# Patient Record
Sex: Female | Born: 1945
Health system: Southern US, Community
[De-identification: ages and names within clinical notes are randomized; demographics above are authoritative.]

## PROBLEM LIST (undated history)

## (undated) DIAGNOSIS — D8685 Sarcoid myocarditis: Secondary | ICD-10-CM

## (undated) DIAGNOSIS — I5042 Chronic combined systolic (congestive) and diastolic (congestive) heart failure: Secondary | ICD-10-CM

## (undated) DIAGNOSIS — I499 Cardiac arrhythmia, unspecified: Secondary | ICD-10-CM

## (undated) DIAGNOSIS — I493 Ventricular premature depolarization: Secondary | ICD-10-CM

## (undated) DIAGNOSIS — I484 Atypical atrial flutter: Secondary | ICD-10-CM

## (undated) DIAGNOSIS — I428 Other cardiomyopathies: Secondary | ICD-10-CM

## (undated) DIAGNOSIS — F419 Anxiety disorder, unspecified: Secondary | ICD-10-CM

## (undated) DIAGNOSIS — Z95810 Presence of automatic (implantable) cardiac defibrillator: Secondary | ICD-10-CM

## (undated) DIAGNOSIS — I480 Paroxysmal atrial fibrillation: Secondary | ICD-10-CM

## (undated) DIAGNOSIS — I10 Essential (primary) hypertension: Secondary | ICD-10-CM

## (undated) DIAGNOSIS — M797 Fibromyalgia: Secondary | ICD-10-CM

## (undated) DIAGNOSIS — I491 Atrial premature depolarization: Secondary | ICD-10-CM

## (undated) DIAGNOSIS — I472 Ventricular tachycardia, unspecified: Secondary | ICD-10-CM

## (undated) DIAGNOSIS — I495 Sick sinus syndrome: Secondary | ICD-10-CM

## (undated) HISTORY — PX: THYROIDECTOMY: SHX17

## (undated) HISTORY — DX: Paroxysmal atrial fibrillation: I48.0

## (undated) HISTORY — DX: Atrial premature depolarization: I49.1

## (undated) HISTORY — DX: Atypical atrial flutter: I48.4

## (undated) HISTORY — PX: ABDOMINAL HYSTERECTOMY: SHX81

## (undated) HISTORY — PX: LUMBAR LAMINECTOMY: SHX95

## (undated) HISTORY — DX: Ventricular premature depolarization: I49.3

---

## 1998-09-30 ENCOUNTER — Other Ambulatory Visit: Admission: RE | Admit: 1998-09-30 | Discharge: 1998-09-30 | Payer: Self-pay | Admitting: *Deleted

## 1999-12-30 ENCOUNTER — Other Ambulatory Visit: Admission: RE | Admit: 1999-12-30 | Discharge: 1999-12-30 | Payer: Self-pay | Admitting: *Deleted

## 2001-08-22 ENCOUNTER — Other Ambulatory Visit: Admission: RE | Admit: 2001-08-22 | Discharge: 2001-08-22 | Payer: Self-pay | Admitting: *Deleted

## 2002-09-25 ENCOUNTER — Other Ambulatory Visit: Admission: RE | Admit: 2002-09-25 | Discharge: 2002-09-25 | Payer: Self-pay | Admitting: *Deleted

## 2002-10-05 ENCOUNTER — Encounter: Admission: RE | Admit: 2002-10-05 | Discharge: 2002-10-05 | Payer: Self-pay

## 2006-12-19 ENCOUNTER — Other Ambulatory Visit: Admission: RE | Admit: 2006-12-19 | Discharge: 2006-12-19 | Payer: Self-pay | Admitting: *Deleted

## 2006-12-23 DIAGNOSIS — J309 Allergic rhinitis, unspecified: Secondary | ICD-10-CM | POA: Insufficient documentation

## 2007-08-07 ENCOUNTER — Emergency Department (HOSPITAL_COMMUNITY): Admission: EM | Admit: 2007-08-07 | Discharge: 2007-08-07 | Payer: Self-pay | Admitting: Emergency Medicine

## 2007-08-18 ENCOUNTER — Encounter: Admission: RE | Admit: 2007-08-18 | Discharge: 2007-08-18 | Payer: Self-pay | Admitting: Otolaryngology

## 2008-11-01 DIAGNOSIS — K219 Gastro-esophageal reflux disease without esophagitis: Secondary | ICD-10-CM | POA: Insufficient documentation

## 2010-04-21 DIAGNOSIS — IMO0001 Reserved for inherently not codable concepts without codable children: Secondary | ICD-10-CM | POA: Insufficient documentation

## 2010-10-29 DIAGNOSIS — M8580 Other specified disorders of bone density and structure, unspecified site: Secondary | ICD-10-CM | POA: Insufficient documentation

## 2012-07-14 ENCOUNTER — Encounter (HOSPITAL_COMMUNITY): Payer: Self-pay | Admitting: Emergency Medicine

## 2012-07-14 ENCOUNTER — Emergency Department (HOSPITAL_COMMUNITY): Payer: Medicare Other

## 2012-07-14 ENCOUNTER — Emergency Department (HOSPITAL_COMMUNITY)
Admission: EM | Admit: 2012-07-14 | Discharge: 2012-07-14 | Disposition: A | Discharge status: Home / Self Care | Payer: Medicare Other | Attending: Emergency Medicine | Admitting: Emergency Medicine

## 2012-07-14 DIAGNOSIS — R5383 Other fatigue: Secondary | ICD-10-CM | POA: Insufficient documentation

## 2012-07-14 DIAGNOSIS — I10 Essential (primary) hypertension: Secondary | ICD-10-CM | POA: Insufficient documentation

## 2012-07-14 DIAGNOSIS — R531 Weakness: Secondary | ICD-10-CM

## 2012-07-14 DIAGNOSIS — Z79899 Other long term (current) drug therapy: Secondary | ICD-10-CM | POA: Insufficient documentation

## 2012-07-14 DIAGNOSIS — Z8739 Personal history of other diseases of the musculoskeletal system and connective tissue: Secondary | ICD-10-CM | POA: Insufficient documentation

## 2012-07-14 DIAGNOSIS — F411 Generalized anxiety disorder: Secondary | ICD-10-CM | POA: Insufficient documentation

## 2012-07-14 DIAGNOSIS — R5381 Other malaise: Secondary | ICD-10-CM | POA: Insufficient documentation

## 2012-07-14 HISTORY — DX: Anxiety disorder, unspecified: F41.9

## 2012-07-14 HISTORY — DX: Essential (primary) hypertension: I10

## 2012-07-14 HISTORY — DX: Fibromyalgia: M79.7

## 2012-07-14 HISTORY — DX: Cardiac arrhythmia, unspecified: I49.9

## 2012-07-14 LAB — TROPONIN I: Troponin I: <0.3 ng/mL (ref <0.30)

## 2012-07-14 LAB — CBC WITH DIFFERENTIAL/PLATELET
HCT: 36.6 % (ref 36.0–46.0)
Hemoglobin: 12.9 g/dL (ref 12.0–15.0)
MCH: 30.7 pg (ref 26.0–34.0)
MCHC: 35.2 g/dL (ref 30.0–36.0)
Monocytes Absolute: 0.5 10*3/uL (ref 0.1–1.0)
Monocytes Relative: 7 % (ref 3–12)
Neutro Abs: 2.8 10*3/uL (ref 1.7–7.7)
Neutrophils Relative %: 42 % — ABNORMAL LOW (ref 43–77)
Platelets: 161 10*3/uL (ref 150–400)
RDW: 15.4 % (ref 11.5–15.5)
WBC: 6.7 10*3/uL (ref 4.0–10.5)

## 2012-07-14 LAB — COMPREHENSIVE METABOLIC PANEL
BUN: 20 mg/dL (ref 6–23)
CO2: 28 mEq/L (ref 19–32)
Creatinine, Ser: 1.11 mg/dL — ABNORMAL HIGH (ref 0.50–1.10)
Glucose, Bld: 111 mg/dL — ABNORMAL HIGH (ref 70–99)
Potassium: 3.7 mEq/L (ref 3.5–5.1)
Total Bilirubin: 0.3 mg/dL (ref 0.3–1.2)

## 2012-07-14 MED ORDER — MECLIZINE HCL 25 MG PO TABS: ORAL_TABLET | ORAL | Status: DC

## 2012-07-14 NOTE — ED Notes (Signed)
Patient from home via [facility] c/o dizziness.  Per EMS patient was having runs of mutlifocal runs of PVCs.  No runs of VTach noted.  Hx of hypertension.  Patient alert and oriented x4 at this time and stable will continue to monitor.

## 2012-07-14 NOTE — ED Provider Notes (Signed)
History     CSN: [id number]  Arrival date & time 07/14/12  0041   First MD Initiated Contact with Patient 07/14/12 0143      Chief Complaint  Patient presents with  . Dizziness    (Consider location/radiation/quality/duration/timing/severity/associated sxs/prior treatment) Patient is a 67 y.o. female presenting with weakness. The history is provided by the patient (the pt complains of dizziness). No language interpreter was used.  Weakness This is a new problem. The current episode started 12 to 24 hours ago. The problem occurs constantly. The problem has been gradually improving. Pertinent negatives include no chest pain, no abdominal pain and no headaches. Nothing aggravates the symptoms. Nothing relieves the symptoms.    Past Medical History  Diagnosis Date  . Hypertension   . Anxiety   . Irregular heartbeat   . Fibromyalgia     Past Surgical History  Procedure Laterality Date  . Lumbar laminectomy    . Thyroidectomy    . Abdominal hysterectomy      No family history on file.  History  Substance Use Topics  . Smoking status: Never Smoker   . Smokeless tobacco: Never Used  . Alcohol Use: No    OB History   Grav Para Term Preterm Abortions TAB SAB Ect Mult Living                  Review of Systems  Constitutional: Negative for appetite change and fatigue.  HENT: Negative for congestion, sinus pressure and ear discharge.   Eyes: Negative for discharge.  Respiratory: Negative for cough.   Cardiovascular: Negative for chest pain.  Gastrointestinal: Negative for abdominal pain and diarrhea.  Genitourinary: Negative for frequency and hematuria.  Musculoskeletal: Negative for back pain.  Skin: Negative for rash.  Neurological: Positive for weakness. Negative for seizures and headaches.  Psychiatric/Behavioral: Negative for hallucinations.    Allergies  Codeine  Home Medications   Current Outpatient Rx  Name  Route  Sig  Dispense  Refill  . ALPRAZolam  (XANAX) 0.5 MG tablet   Oral   Take 0.25 mg by mouth daily as needed for anxiety.         Marland Kitchen amitriptyline (ELAVIL) 25 MG tablet   Oral   Take 12.5 mg by mouth at bedtime.         . hydrochlorothiazide (HYDRODIURIL) 25 MG tablet   Oral   Take 25 mg by mouth daily.         Marland Kitchen levothyroxine (SYNTHROID, LEVOTHROID) 200 MCG tablet   Oral   Take 200 mcg by mouth daily before breakfast.         . meclizine (ANTIVERT) 25 MG tablet      Take one every 6 hours for dizziness   15 tablet   0     BP 138/70  Pulse 63  Temp(Src) 97.8 F (36.6 C) (Oral)  Resp 18  SpO2 100%  Physical Exam  Constitutional: She is oriented to person, place, and time. She appears well-developed.  HENT:  Head: Normocephalic.  Eyes: Conjunctivae and EOM are normal. No scleral icterus.  Neck: Neck supple. No thyromegaly present.  Cardiovascular: Normal rate and regular rhythm.  Exam reveals no gallop and no friction rub.   No murmur heard. Pulmonary/Chest: No stridor. She has no wheezes. She has no rales. She exhibits no tenderness.  Abdominal: She exhibits no distension. There is no tenderness. There is no rebound.  Musculoskeletal: Normal range of motion. She exhibits no edema.  Lymphadenopathy:  She has no cervical adenopathy.  Neurological: She is oriented to person, place, and time. Coordination normal.  Skin: No rash noted. No erythema.  Psychiatric: She has a normal mood and affect. Her behavior is normal.    ED Course  Procedures (including critical care time)  Labs Reviewed  CBC WITH DIFFERENTIAL - Abnormal; Notable for the following:    Neutrophils Relative 42 (*)    Lymphocytes Relative 49 (*)    All other components within normal limits  COMPREHENSIVE METABOLIC PANEL - Abnormal; Notable for the following:    Glucose, Bld 111 (*)    Creatinine, Ser 1.11 (*)    GFR calc non Af Amer 50 (*)    GFR calc Af Amer 58 (*)    All other components within normal limits  TROPONIN I    Dg Chest Port 1 View  07/14/2012  *RADIOLOGY REPORT*  Clinical Data: Dizziness.  Weakness.  Irregular heart beat. Fibromyalgia.  PORTABLE CHEST - 1 VIEW  Comparison: None.  Findings: Heart size upper limits of normal for projection.  No airspace disease.  No effusion. Monitoring leads are projected over the chest. Bilateral basilar atelectasis.  IMPRESSION: Borderline heart size.  No acute cardiopulmonary disease.   Original Report Authenticated By: Andreas Newport, M.D.      1. Weakness      Date: 07/14/2012  Rate: 68  Rhythm: normal sinus rhythm  With pvc  QRS Axis: left  Intervals: normal  ST/T Wave abnormalities: normal  Conduction Disutrbances:none  Narrative Interpretation:   Old EKG Reviewed: none available    MDM          Benny Lennert, MD 07/14/12 [phone]

## 2013-08-10 DIAGNOSIS — F411 Generalized anxiety disorder: Secondary | ICD-10-CM | POA: Insufficient documentation

## 2014-03-27 IMAGING — CR DG CHEST 1V PORT
1 series · 1 of 1 positions shown · non-contrast
Comparison: None.

CLINICAL DATA: Dizziness.  Weakness.  Irregular heart beat.
Fibromyalgia.

PORTABLE CHEST - 1 VIEW

[AP]
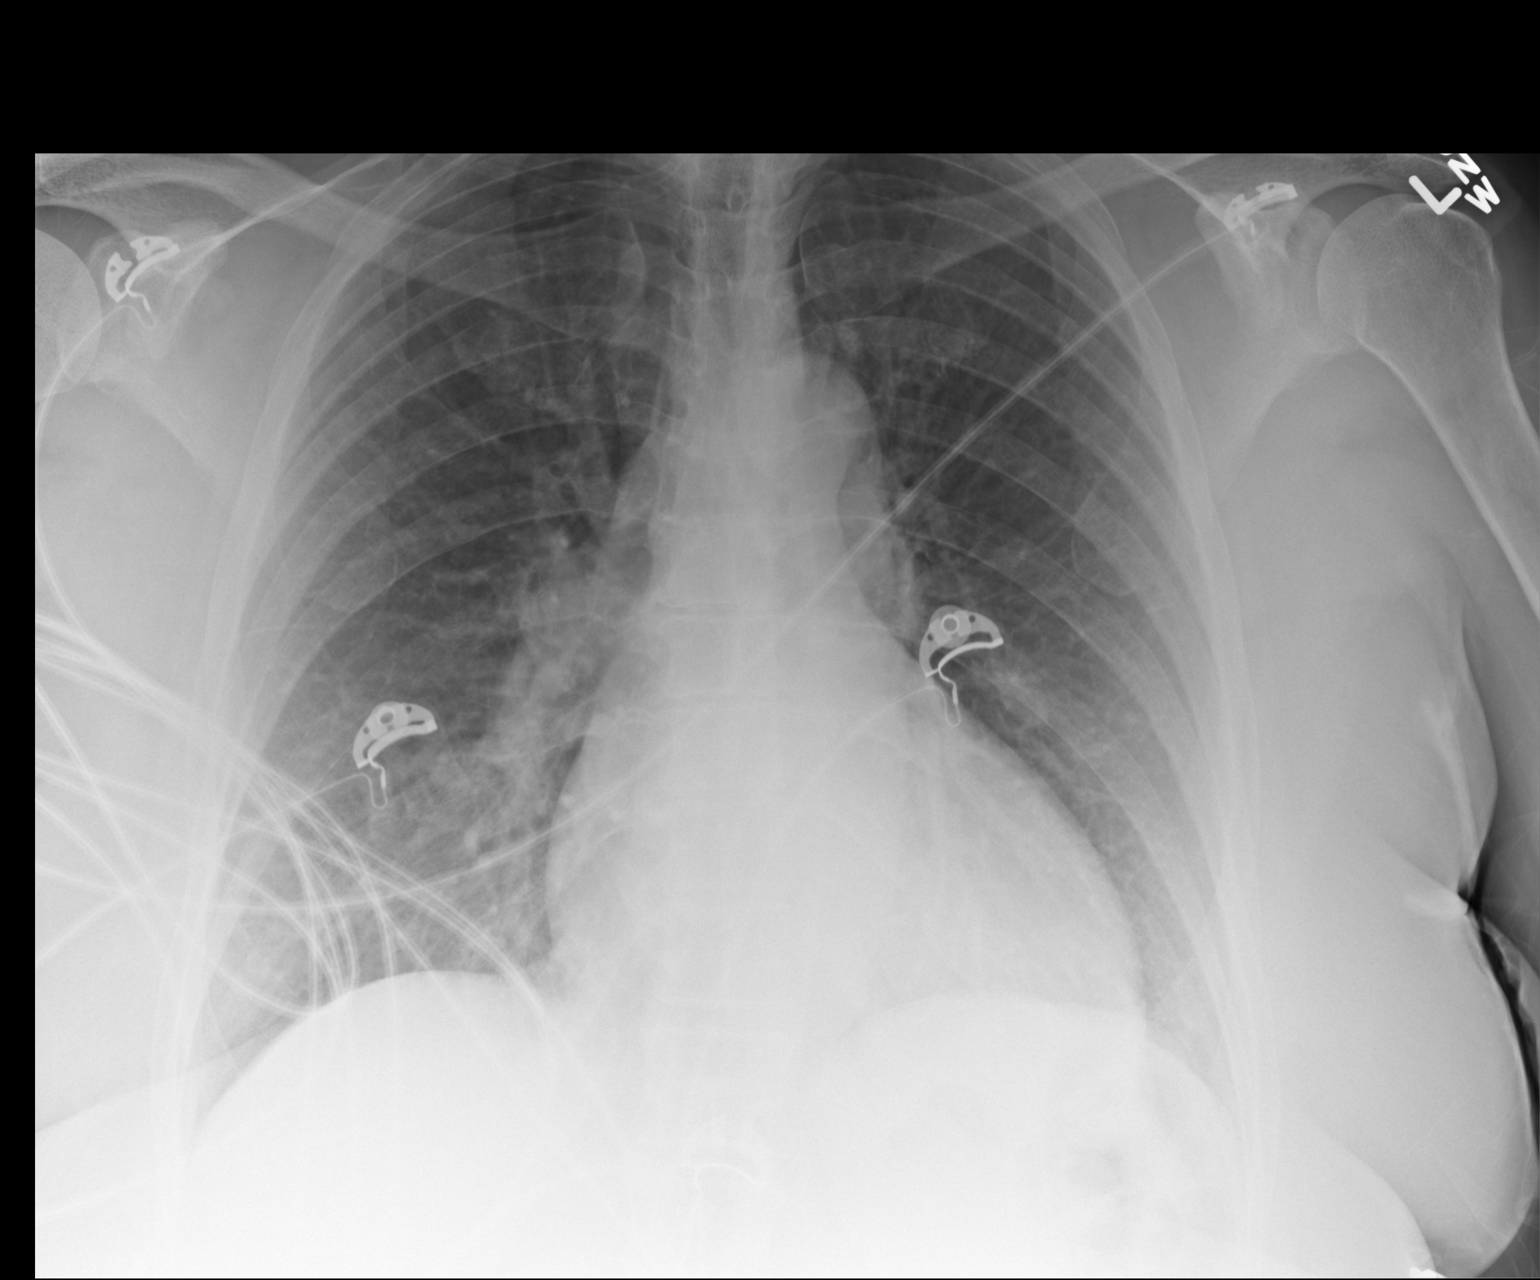

[1 of 1 positions shown; findings below may reference images not displayed]

FINDINGS: Heart size upper limits of normal for projection.  No
airspace disease.  No effusion. Monitoring leads are projected over
the chest. Bilateral basilar atelectasis.
IMPRESSION: Borderline heart size.  No acute cardiopulmonary disease.

## 2016-08-04 DIAGNOSIS — I493 Ventricular premature depolarization: Secondary | ICD-10-CM | POA: Insufficient documentation

## 2018-04-05 DIAGNOSIS — E669 Obesity, unspecified: Secondary | ICD-10-CM | POA: Diagnosis not present

## 2018-04-05 DIAGNOSIS — E039 Hypothyroidism, unspecified: Secondary | ICD-10-CM | POA: Diagnosis not present

## 2018-04-05 DIAGNOSIS — R7303 Prediabetes: Secondary | ICD-10-CM | POA: Diagnosis not present

## 2018-04-05 DIAGNOSIS — I10 Essential (primary) hypertension: Secondary | ICD-10-CM | POA: Diagnosis not present

## 2018-04-07 DIAGNOSIS — I10 Essential (primary) hypertension: Secondary | ICD-10-CM | POA: Diagnosis not present

## 2018-04-07 DIAGNOSIS — E039 Hypothyroidism, unspecified: Secondary | ICD-10-CM | POA: Diagnosis not present

## 2018-04-07 DIAGNOSIS — R7303 Prediabetes: Secondary | ICD-10-CM | POA: Diagnosis not present

## 2018-05-11 DIAGNOSIS — N289 Disorder of kidney and ureter, unspecified: Secondary | ICD-10-CM | POA: Diagnosis not present

## 2018-05-11 DIAGNOSIS — N2889 Other specified disorders of kidney and ureter: Secondary | ICD-10-CM | POA: Diagnosis not present

## 2018-05-11 DIAGNOSIS — I10 Essential (primary) hypertension: Secondary | ICD-10-CM | POA: Diagnosis not present

## 2019-04-05 DIAGNOSIS — I499 Cardiac arrhythmia, unspecified: Secondary | ICD-10-CM | POA: Diagnosis not present

## 2019-04-05 DIAGNOSIS — I10 Essential (primary) hypertension: Secondary | ICD-10-CM | POA: Diagnosis not present

## 2019-04-05 DIAGNOSIS — R600 Localized edema: Secondary | ICD-10-CM | POA: Diagnosis not present

## 2019-04-05 DIAGNOSIS — F411 Generalized anxiety disorder: Secondary | ICD-10-CM | POA: Diagnosis not present

## 2019-04-10 DIAGNOSIS — I10 Essential (primary) hypertension: Secondary | ICD-10-CM | POA: Diagnosis not present

## 2019-04-10 DIAGNOSIS — E7800 Pure hypercholesterolemia, unspecified: Secondary | ICD-10-CM | POA: Diagnosis not present

## 2019-04-10 DIAGNOSIS — E890 Postprocedural hypothyroidism: Secondary | ICD-10-CM | POA: Diagnosis not present

## 2019-04-10 DIAGNOSIS — R7303 Prediabetes: Secondary | ICD-10-CM | POA: Diagnosis not present

## 2019-04-17 DIAGNOSIS — E890 Postprocedural hypothyroidism: Secondary | ICD-10-CM | POA: Diagnosis not present

## 2019-04-17 DIAGNOSIS — F411 Generalized anxiety disorder: Secondary | ICD-10-CM | POA: Diagnosis not present

## 2019-04-17 DIAGNOSIS — Z0000 Encounter for general adult medical examination without abnormal findings: Secondary | ICD-10-CM | POA: Diagnosis not present

## 2019-04-17 DIAGNOSIS — I10 Essential (primary) hypertension: Secondary | ICD-10-CM | POA: Diagnosis not present

## 2019-11-12 ENCOUNTER — Encounter (HOSPITAL_COMMUNITY): Payer: Self-pay

## 2019-11-12 ENCOUNTER — Ambulatory Visit (HOSPITAL_COMMUNITY)
Admission: EM | Admit: 2019-11-12 | Discharge: 2019-11-12 | Disposition: A | Discharge status: Home / Self Care | Payer: Medicare Other | Attending: Family Medicine | Admitting: Family Medicine

## 2019-11-12 ENCOUNTER — Other Ambulatory Visit: Payer: Self-pay

## 2019-11-12 DIAGNOSIS — M791 Myalgia, unspecified site: Secondary | ICD-10-CM

## 2019-11-12 DIAGNOSIS — R200 Anesthesia of skin: Secondary | ICD-10-CM | POA: Diagnosis not present

## 2019-11-12 DIAGNOSIS — I10 Essential (primary) hypertension: Secondary | ICD-10-CM

## 2019-11-12 MED ORDER — GABAPENTIN 300 MG PO CAPS: 300.0000 mg | ORAL_CAPSULE | Freq: Three times a day (TID) | ORAL | 0 refills | Status: DC | PRN

## 2019-11-12 NOTE — ED Triage Notes (Signed)
Pt is here with left side weakness that started after picking up wet clothes a week, pt has not taken any meds to relieve discomfort. Pt states the EMS came yesterday around 5pm & states they did an EKG & Glucose - 143 & informed her to follow up with her PCP.

## 2019-11-12 NOTE — ED Provider Notes (Signed)
[facility]    CSN: [id number] Arrival date & time: 11/12/19  1138      History   Chief Complaint Chief Complaint  Patient presents with  . Hypertension    HPI Sheri Morgan is a 74 y.o. female.   Has been dealing with left UE numbness and tingling for a week now since picking up a heavy clothes basket at the laundromat, then yesterday the right leg "gave out" on her and was numb. Called EMS, who came and did an EKG and fingerstick glucose which was 143 (hx of IFG per chart review). Denies CP, SOB, HAs, dizziness, visual changes, speech or memory concerns. Right leg is improved today, left shoulder and arm still achy and numb at times. Hx of lumbar surgery with subsequent episodic right sided sciatica, and hx of fibromyalgia. Does have a muscle relaxer at home that she hasn't used yet. Took some advil but it hurt her stomach so she is not currently taking anything. Has been trying to get in with her PCP for weeks now but unable to schedule at this time so awaiting establishing with another PCP in the near future.      Past Medical History:  Diagnosis Date  . Anxiety   . Fibromyalgia   . Hypertension   . Irregular heartbeat     There are no problems to display for this patient.   Past Surgical History:  Procedure Laterality Date  . ABDOMINAL HYSTERECTOMY    . LUMBAR LAMINECTOMY    . THYROIDECTOMY      OB History   No obstetric history on file.      Home Medications    Prior to Admission medications   Medication Sig Start Date End Date Taking? Authorizing Provider  ALPRAZolam Prudy Feeler) 0.5 MG tablet Take 0.25 mg by mouth daily as needed for anxiety.    [provider]  amitriptyline (ELAVIL) 25 MG tablet Take 12.5 mg by mouth at bedtime.    [provider]  gabapentin (NEURONTIN) 300 MG capsule Take 1 capsule (300 mg total) by mouth 3 (three) times daily as needed. 11/12/19   Particia Nearing, PA-C  hydrochlorothiazide  (HYDRODIURIL) 25 MG tablet Take 25 mg by mouth daily.    [provider]  levothyroxine (SYNTHROID, LEVOTHROID) 200 MCG tablet Take 200 mcg by mouth daily before breakfast.    [provider]  meclizine (ANTIVERT) 25 MG tablet Take one every 6 hours for dizziness 07/14/12   Bethann Berkshire, MD    Family History Family History  Problem Relation Age of Onset  . Diabetes Mother   . Alcoholism Father     Social History Social History   Tobacco Use  . Smoking status: Never Smoker  . Smokeless tobacco: Never Used  Substance Use Topics  . Alcohol use: No  . Drug use: No     Allergies   Codeine   Review of Systems Review of Systems  Constitutional: Negative.   HENT: Negative.   Eyes: Negative.   Respiratory: Negative.   Cardiovascular: Negative.   Gastrointestinal: Negative.   Genitourinary: Negative.   Musculoskeletal: Positive for arthralgias, myalgias and neck stiffness.  Skin: Negative.   Neurological: Positive for numbness.  Psychiatric/Behavioral: Negative.       Physical Exam Triage Vital Signs ED Triage Vitals  Enc Vitals Group     BP 11/12/19 1158 (!) 172/64     Pulse Rate 11/12/19 1158 (!) 58     Resp 11/12/19 1158 18  Temp 11/12/19 1158 98.3 F (36.8 C)     Temp Source 11/12/19 1158 Oral     SpO2 11/12/19 1158 100 %     Weight --      Height --      Head Circumference --      Peak Flow --      Pain Score 11/12/19 1157 0     Pain Loc --      Pain Edu? --      Excl. in GC? --    No data found.  Updated Vital Signs BP (!) 172/64 (BP Location: Right Arm)   Pulse (!) 58   Temp 98.3 F (36.8 C) (Oral)   Resp 18   SpO2 100%   Visual Acuity Right Eye Distance:   Left Eye Distance:   Bilateral Distance:    Right Eye Near:   Left Eye Near:    Bilateral Near:     Physical Exam Vitals and nursing note reviewed.  Constitutional:      Appearance: Normal appearance. She is not ill-appearing.  HENT:     Head: Atraumatic.    Eyes:     Extraocular Movements: Extraocular movements intact.     Conjunctiva/sclera: Conjunctivae normal.  Cardiovascular:     Rate and Rhythm: Normal rate and regular rhythm.     Pulses: Normal pulses.     Heart sounds: Normal heart sounds.  Pulmonary:     Effort: Pulmonary effort is normal.     Breath sounds: Normal breath sounds.  Musculoskeletal:        General: No swelling, tenderness or deformity. Normal range of motion.     Cervical back: Normal range of motion and neck supple.     Comments: - SLR b/l LEs   Skin:    General: Skin is warm and dry.  Neurological:     Mental Status: She is alert and oriented to person, place, and time.     Sensory: Sensory deficit (mildly decreased sensation to light touch right LE) present.     Motor: No weakness.     Gait: Gait normal.  Psychiatric:        Mood and Affect: Mood normal.        Thought Content: Thought content normal.        Judgment: Judgment normal.      UC Treatments / Results  Labs (all labs ordered are listed, but only abnormal results are displayed) Labs Reviewed - No data to display  EKG   Radiology No results found.  Procedures Procedures (including critical care time)  Medications Ordered in UC Medications - No data to display  Initial Impression / Assessment and Plan / UC Course  I have reviewed the triage vital signs and the nursing notes.  Pertinent labs & imaging results that were available during my care of the patient were reviewed by me and considered in my medical decision making (see chart for details).     Suspect her right leg weakness in numbness could be coming from her low back issues. Discussed trial of gabapentin, stretches. Left arm sxs suspect a strain causing inflammation and radicular sxs. Voltaren gel, stretches, heat, and gabapentin given. Discussed safe use of her muscle relaxer which she has at home already from a recent visit with PCP.   Blood pressure elevated today,  has follow up scheduled to establish care with PCP who may decide to make adjustments to her blood pressure regimen. EKG with some abnormalities to rhythm, PVCs  but no acute ST or T wave changes and she states she has seen the Cardiologist for this in the past few years. F/u with PCP for this issue once established and strict ER precautions given if having CP, SOB, palpitations.   Final Clinical Impressions(s) / UC Diagnoses   Final diagnoses:  Essential hypertension  Numbness  Myalgia     Discharge Instructions     Voltaren gel - available over the counter, apply to areas of pain as needed. This is in the same family as ibuprofen and will do the same thing without hurting your stomach.   Take your muscle relaxer as needed for muscle stiffness, stretch and keep your body moving.   Gabapentin as needed for the numbness and tingling    ED Prescriptions    Medication Sig Dispense Auth. Provider   gabapentin (NEURONTIN) 300 MG capsule Take 1 capsule (300 mg total) by mouth 3 (three) times daily as needed. 90 capsule Particia Nearing, [place]     PDMP not reviewed this encounter.   Particia Nearing, [place] 11/13/19 1154

## 2019-11-12 NOTE — Discharge Instructions (Signed)
Voltaren gel - available over the counter, apply to areas of pain as needed. This is in the same family as ibuprofen and will do the same thing without hurting your stomach.   Take your muscle relaxer as needed for muscle stiffness, stretch and keep your body moving.   Gabapentin as needed for the numbness and tingling

## 2019-11-26 ENCOUNTER — Ambulatory Visit: Payer: Medicare Other | Admitting: Family

## 2020-01-02 DIAGNOSIS — H524 Presbyopia: Secondary | ICD-10-CM | POA: Diagnosis not present

## 2020-01-23 DIAGNOSIS — H04123 Dry eye syndrome of bilateral lacrimal glands: Secondary | ICD-10-CM | POA: Diagnosis not present

## 2020-01-23 DIAGNOSIS — H02831 Dermatochalasis of right upper eyelid: Secondary | ICD-10-CM | POA: Diagnosis not present

## 2020-01-23 DIAGNOSIS — H02834 Dermatochalasis of left upper eyelid: Secondary | ICD-10-CM | POA: Diagnosis not present

## 2020-01-23 DIAGNOSIS — H2513 Age-related nuclear cataract, bilateral: Secondary | ICD-10-CM | POA: Diagnosis not present

## 2020-02-05 ENCOUNTER — Ambulatory Visit (INDEPENDENT_AMBULATORY_CARE_PROVIDER_SITE_OTHER): Payer: Medicare Other | Admitting: Internal Medicine

## 2020-02-05 ENCOUNTER — Encounter: Payer: Self-pay | Admitting: Internal Medicine

## 2020-02-05 ENCOUNTER — Other Ambulatory Visit: Payer: Self-pay

## 2020-02-05 VITALS — BP 126/82 | HR 90 | Temp 97.9°F | Resp 16 | Ht 63.5 in | Wt 213.0 lb

## 2020-02-05 DIAGNOSIS — R200 Anesthesia of skin: Secondary | ICD-10-CM | POA: Diagnosis not present

## 2020-02-05 DIAGNOSIS — E039 Hypothyroidism, unspecified: Secondary | ICD-10-CM

## 2020-02-05 DIAGNOSIS — R7303 Prediabetes: Secondary | ICD-10-CM

## 2020-02-05 DIAGNOSIS — N1831 Chronic kidney disease, stage 3a: Secondary | ICD-10-CM

## 2020-02-05 DIAGNOSIS — I10 Essential (primary) hypertension: Secondary | ICD-10-CM | POA: Diagnosis not present

## 2020-02-05 DIAGNOSIS — E785 Hyperlipidemia, unspecified: Secondary | ICD-10-CM | POA: Insufficient documentation

## 2020-02-05 DIAGNOSIS — I4811 Longstanding persistent atrial fibrillation: Secondary | ICD-10-CM | POA: Diagnosis not present

## 2020-02-05 LAB — HEPATIC FUNCTION PANEL
ALT: 13 U/L (ref 0–35)
AST: 20 U/L (ref 0–37)
Albumin: 4.3 g/dL (ref 3.5–5.2)
Alkaline Phosphatase: 100 U/L (ref 39–117)
Bilirubin, Direct: 0.1 mg/dL (ref 0.0–0.3)
Total Bilirubin: 0.6 mg/dL (ref 0.2–1.2)
Total Protein: 7.9 g/dL (ref 6.0–8.3)

## 2020-02-05 LAB — BASIC METABOLIC PANEL
BUN: 17 mg/dL (ref 6–23)
CO2: 31 mEq/L (ref 19–32)
Calcium: 9.6 mg/dL (ref 8.4–10.5)
Chloride: 98 mEq/L (ref 96–112)
Creatinine, Ser: 1.08 mg/dL (ref 0.40–1.20)
GFR: 50.1 mL/min — ABNORMAL LOW (ref >60.00)
Glucose, Bld: 95 mg/dL (ref 70–99)
Potassium: 3.7 mEq/L (ref 3.5–5.1)
Sodium: 136 mEq/L (ref 135–145)

## 2020-02-05 LAB — CBC WITH DIFFERENTIAL/PLATELET
Basophils Absolute: 0.1 10*3/uL (ref 0.0–0.1)
Basophils Relative: 1 % (ref 0.0–3.0)
Eosinophils Absolute: 0.1 10*3/uL (ref 0.0–0.7)
Eosinophils Relative: 2 % (ref 0.0–5.0)
HCT: 40.2 % (ref 36.0–46.0)
Hemoglobin: 13.4 g/dL (ref 12.0–15.0)
Lymphocytes Relative: 40.9 % (ref 12.0–46.0)
Lymphs Abs: 2.2 10*3/uL (ref 0.7–4.0)
MCHC: 33.3 g/dL (ref 30.0–36.0)
MCV: 90.1 fl (ref 78.0–100.0)
Monocytes Absolute: 0.6 10*3/uL (ref 0.1–1.0)
Monocytes Relative: 10.9 % (ref 3.0–12.0)
Neutro Abs: 2.4 10*3/uL (ref 1.4–7.7)
Neutrophils Relative %: 45.2 % (ref 43.0–77.0)
Platelets: 191 10*3/uL (ref 150.0–400.0)
RBC: 4.46 Mil/uL (ref 3.87–5.11)
RDW: 16.7 % — ABNORMAL HIGH (ref 11.5–15.5)
WBC: 5.3 10*3/uL (ref 4.0–10.5)

## 2020-02-05 LAB — LIPID PANEL
Cholesterol: 253 mg/dL — ABNORMAL HIGH (ref 0–200)
HDL: 103.2 mg/dL (ref >39.00)
LDL Cholesterol: 132 mg/dL — ABNORMAL HIGH (ref 0–99)
NonHDL: 149.74
Total CHOL/HDL Ratio: 2
Triglycerides: 87 mg/dL (ref 0.0–149.0)
VLDL: 17.4 mg/dL (ref 0.0–40.0)

## 2020-02-05 LAB — TSH: TSH: 13.96 u[IU]/mL — ABNORMAL HIGH (ref 0.35–4.50)

## 2020-02-05 LAB — HEMOGLOBIN A1C: Hgb A1c MFr Bld: 6.4 % (ref 4.6–6.5)

## 2020-02-05 LAB — FOLATE: Folate: 21.9 ng/mL (ref >5.9)

## 2020-02-05 LAB — VITAMIN B12: Vitamin B-12: 314 pg/mL (ref 211–911)

## 2020-02-05 MED ORDER — LEVOTHYROXINE SODIUM 25 MCG PO TABS
25.0000 ug | ORAL_TABLET | Freq: Every day | ORAL | 0 refills | Status: DC
Start: 2020-02-05 — End: 2020-05-30

## 2020-02-05 MED ORDER — LEVOTHYROXINE SODIUM 200 MCG PO TABS: 200.0000 ug | ORAL_TABLET | Freq: Every day | ORAL | 0 refills | Status: DC

## 2020-02-05 NOTE — Patient Instructions (Signed)

## 2020-02-05 NOTE — Progress Notes (Signed)
Subjective:  Patient ID: Sheri Morgan, female    DOB: Feb 16, 1946  Age: 74 y.o. MRN: [id number]  CC: Atrial Fibrillation, Hypothyroidism, Hypertension, and Hyperlipidemia  This visit occurred during the SARS-CoV-2 public health emergency.  Safety protocols were in place, including screening questions prior to the visit, additional usage of staff PPE, and extensive cleaning of exam room while observing appropriate contact time as indicated for disinfecting solutions.   NEW TO ME  HPI Sheri Morgan presents for f/up -   1.  She tells me she has has a lifelong history of an irregular heart rate.  Her records from [facility] indicate paroxysmal atrial fibrillation.  She tells me she is not currently treating her heart rhythm and is not being anticoagulated.  She complains of anxiety but denies palpitations, dizziness, lightheadedness, near-syncope, or syncope.  2.  She has a history of hypothyroidism.  She tells me she has not recently been consistently taking her thyroid supplement.  She complains of weight gain and fatigue.  3.  She complains of a several month history of worsening numbness in her upper extremities, worse on the left than the right.  It looks like she was seen in the ED a couple months ago about this but no brain or neck imagings were performed.  She denies headache, blurred vision, neck pain, weakness, or tingling.  She has chronic low back pain but says her lower extremities do not bother her.  She also complains of forgetfulness and worsening memory but she does not experience confusion.  Outpatient Medications Prior to Visit  Medication Sig Dispense Refill  . amitriptyline (ELAVIL) 25 MG tablet Take 12.5 mg by mouth at bedtime.    . gabapentin (NEURONTIN) 300 MG capsule Take 1 capsule (300 mg total) by mouth 3 (three) times daily as needed. 90 capsule 0  . LORazepam (ATIVAN) 0.5 MG tablet Take 0.5 mg by mouth every 8 (eight) hours.    . ALPRAZolam (XANAX) 0.5  MG tablet Take 0.25 mg by mouth daily as needed for anxiety.    . hydrochlorothiazide (HYDRODIURIL) 25 MG tablet Take 25 mg by mouth daily.    Marland Kitchen levothyroxine (SYNTHROID, LEVOTHROID) 200 MCG tablet Take 200 mcg by mouth daily before breakfast.    . meclizine (ANTIVERT) 25 MG tablet Take one every 6 hours for dizziness 15 tablet 0   No facility-administered medications prior to visit.    ROS Review of Systems  Constitutional: Positive for fatigue and unexpected weight change (wt gain). Negative for appetite change, chills and diaphoresis.  HENT: Negative.  Negative for sore throat and trouble swallowing.   Eyes: Negative.   Respiratory: Negative.  Negative for cough, chest tightness, shortness of breath and wheezing.   Cardiovascular: Negative for chest pain, palpitations and leg swelling.  Gastrointestinal: Negative for abdominal pain, constipation, diarrhea, nausea and vomiting.  Endocrine: Negative for cold intolerance, heat intolerance, polydipsia, polyphagia and polyuria.  Genitourinary: Negative.  Negative for difficulty urinating and dysuria.  Musculoskeletal: Positive for back pain. Negative for arthralgias, myalgias and neck pain.  Skin: Negative.   Neurological: Positive for numbness. Negative for dizziness, syncope, speech difficulty, weakness and headaches.  Hematological: Negative for adenopathy. Does not bruise/bleed easily.  Psychiatric/Behavioral: Positive for decreased concentration. Negative for agitation, behavioral problems, confusion, dysphoric mood, hallucinations, self-injury, sleep disturbance and suicidal ideas. The patient is nervous/anxious. The patient is not hyperactive.     Objective:  BP 126/82   Pulse 90   Temp 97.9 F (36.6  C) (Oral)   Resp 16   Ht 5' 3.5" (1.613 m)   Wt 213 lb (96.6 kg)   SpO2 97%   BMI 37.14 kg/m   BP Readings from Last 3 Encounters:  02/05/20 126/82  11/12/19 (!) 172/64  07/14/12 138/70    Wt Readings from Last 3  Encounters:  02/05/20 213 lb (96.6 kg)    Physical Exam Constitutional:      General: She is not in acute distress.    Appearance: She is obese. She is not toxic-appearing or diaphoretic.  HENT:     Nose: Nose normal.     Mouth/Throat:     Mouth: Mucous membranes are moist.  Eyes:     General: No scleral icterus.    Conjunctiva/sclera: Conjunctivae normal.  Cardiovascular:     Rate and Rhythm: Rhythm irregular.     Heart sounds: No murmur heard.      Comments: EKG - ??? A fib vs. SA with 2 PVCs (P waves are not consistently seen), 91 bpm PRWP in anterior leads ? Septal infarct  Pulmonary:     Effort: Pulmonary effort is normal.     Breath sounds: No stridor. No wheezing, rhonchi or rales.  Abdominal:     General: Abdomen is protuberant. Bowel sounds are normal. There is no distension.     Palpations: Abdomen is soft. There is no hepatomegaly, splenomegaly or mass.     Tenderness: There is no abdominal tenderness.  Musculoskeletal:        General: Normal range of motion.     Cervical back: Neck supple.     Right lower leg: No edema.     Left lower leg: No edema.  Lymphadenopathy:     Cervical: No cervical adenopathy.  Skin:    General: Skin is warm and dry.  Neurological:     General: No focal deficit present.     Mental Status: She is alert and oriented to person, place, and time. Mental status is at baseline.     Cranial Nerves: Cranial nerves are intact. No cranial nerve deficit, dysarthria or facial asymmetry.     Sensory: Sensation is intact.     Motor: No weakness.     Coordination: Coordination is intact. Romberg sign negative. Coordination normal. Finger-Nose-Finger Test normal.     Deep Tendon Reflexes: Reflexes are normal and symmetric.  Psychiatric:        Attention and Perception: She is inattentive.        Mood and Affect: Mood is anxious.        Speech: Speech normal.        Behavior: Behavior is slowed. Behavior is not withdrawn.        Thought  Content: Thought content normal. Thought content is not paranoid or delusional. Thought content does not include homicidal or suicidal ideation.        Cognition and Memory: Cognition normal.        Judgment: Judgment normal.     Lab Results  Component Value Date   WBC 5.3 02/05/2020   HGB 13.4 02/05/2020   HCT 40.2 02/05/2020   PLT 191.0 02/05/2020   GLUCOSE 95 02/05/2020   CHOL 253 (H) 02/05/2020   TRIG 87.0 02/05/2020   HDL 103.20 02/05/2020   LDLCALC 132 (H) 02/05/2020   ALT 13 02/05/2020   AST 20 02/05/2020   NA 136 02/05/2020   K 3.7 02/05/2020   CL 98 02/05/2020   CREATININE 1.08 02/05/2020   BUN 17  02/05/2020   CO2 31 02/05/2020   TSH 13.96 (H) 02/05/2020   HGBA1C 6.4 02/05/2020    No results found.  Assessment & Plan:   Sheri Morgan was seen today for atrial fibrillation, hypothyroidism, hypertension and hyperlipidemia.  Diagnoses and all orders for this visit:  Numbness in both hands- Her labs are negative for secondary causes. Her neuro exam is non-focal. I recommended that she undergo an MRI of the brain and cervical spine to look for demyelination, CVA, mass, tumor, or hemorrhage. -     CBC with Differential/Platelet; Future -     Vitamin B12; Future -     Folate; Future -     Folate -     Vitamin B12 -     CBC with Differential/Platelet -     MR Cervical Spine Wo Contrast; Future -     MR Brain Wo Contrast; Future  Longstanding persistent atrial fibrillation (HCC)- I am not certain that she has atrial fibrillation.  I have asked her to see cardiology about this. -     EKG 12-Lead -     Ambulatory referral to Cardiology  Acquired hypothyroidism- Her TSH is elevated at 14 and she is symptomatic.  I recommended that she increase her levothyroxine dose to 225 mcg a day. -     TSH; Future -     TSH -     levothyroxine (SYNTHROID) 200 MCG tablet; Take 1 tablet (200 mcg total) by mouth daily before breakfast. -     levothyroxine (SYNTHROID) 25 MCG tablet; Take 1  tablet (25 mcg total) by mouth daily before breakfast.  Primary hypertension- Her blood pressure is adequately well controlled.  Electrolytes are normal. -     Basic metabolic panel; Future -     Basic metabolic panel -     hydrochlorothiazide (HYDRODIURIL) 25 MG tablet; Take 1 tablet (25 mg total) by mouth daily.  Prediabetes- Her A1c is up to 6.4%.  Medical therapy is not yet indicated.  She will improve her lifestyle modifications. -     Basic metabolic panel; Future -     Hemoglobin A1c; Future -     Hemoglobin A1c -     Basic metabolic panel  Hyperlipidemia LDL goal <130- Her ASCVD risk score is >28% so I have asked her to take a statin for CV risk reduction. -     Lipid panel; Future -     Hepatic function panel; Future -     Hepatic function panel -     Lipid panel  Stage 3a chronic kidney disease (HCC)- She will avoid nephrotoxic agents.  Will continue to maintain control of her blood pressure and her blood sugar.   I have discontinued Sheri Morgan. Mullally's ALPRAZolam and meclizine. I have also changed her levothyroxine and hydrochlorothiazide. Additionally, I am having her start on levothyroxine and rosuvastatin. Lastly, I am having her maintain her amitriptyline, gabapentin, and LORazepam.  Meds ordered this encounter  Medications  . levothyroxine (SYNTHROID) 200 MCG tablet    Sig: Take 1 tablet (200 mcg total) by mouth daily before breakfast.    Dispense:  90 tablet    Refill:  0  . levothyroxine (SYNTHROID) 25 MCG tablet    Sig: Take 1 tablet (25 mcg total) by mouth daily before breakfast.    Dispense:  90 tablet    Refill:  0  . hydrochlorothiazide (HYDRODIURIL) 25 MG tablet    Sig: Take 1 tablet (25 mg total) by mouth daily.  Dispense:  90 tablet    Refill:  1  . rosuvastatin (CRESTOR) 10 MG tablet    Sig: Take 1 tablet (10 mg total) by mouth daily.    Dispense:  90 tablet    Refill:  1    I spent 50 minutes in preparing to see the patient by review of recent  labs, imaging and procedures, obtaining and reviewing separately obtained history, communicating with the patient and family or caregiver, ordering medications, tests or procedures, and documenting clinical information in the EHR including the differential Dx, treatment, and any further evaluation and other management of 1. Numbness in both hands 2. Longstanding persistent atrial fibrillation (HCC) 3. Acquired hypothyroidism 4. Primary hypertension 5. Prediabetes 6. Hyperlipidemia LDL goal <130 7. Stage 3a chronic kidney disease (HCC)    Follow-up: Return in about 3 months (around 05/07/2020).  Sanda Linger, MD

## 2020-02-06 DIAGNOSIS — N1831 Chronic kidney disease, stage 3a: Secondary | ICD-10-CM | POA: Insufficient documentation

## 2020-02-06 MED ORDER — HYDROCHLOROTHIAZIDE 25 MG PO TABS: 25.0000 mg | ORAL_TABLET | Freq: Every day | ORAL | 1 refills | Status: DC

## 2020-02-11 MED ORDER — ROSUVASTATIN CALCIUM 10 MG PO TABS: 10.0000 mg | ORAL_TABLET | Freq: Every day | ORAL | 1 refills | Status: DC

## 2020-02-12 ENCOUNTER — Ambulatory Visit: Payer: Medicare Other | Admitting: Family

## 2020-02-18 ENCOUNTER — Ambulatory Visit: Payer: Medicare Other | Admitting: Internal Medicine

## 2020-03-11 ENCOUNTER — Other Ambulatory Visit: Payer: Medicare Other

## 2020-03-25 ENCOUNTER — Ambulatory Visit: Payer: Medicare Other | Admitting: Internal Medicine

## 2020-04-03 ENCOUNTER — Ambulatory Visit
Admission: RE | Admit: 2020-04-03 | Discharge: 2020-04-03 | Disposition: A | Discharge status: Home / Self Care | Payer: Medicare Other | Source: Ambulatory Visit | Attending: Internal Medicine | Admitting: Internal Medicine

## 2020-04-03 ENCOUNTER — Other Ambulatory Visit: Payer: Self-pay

## 2020-04-03 DIAGNOSIS — R200 Anesthesia of skin: Secondary | ICD-10-CM

## 2020-04-03 DIAGNOSIS — I6782 Cerebral ischemia: Secondary | ICD-10-CM | POA: Diagnosis not present

## 2020-04-03 DIAGNOSIS — M4802 Spinal stenosis, cervical region: Secondary | ICD-10-CM | POA: Diagnosis not present

## 2020-04-03 DIAGNOSIS — I6389 Other cerebral infarction: Secondary | ICD-10-CM | POA: Diagnosis not present

## 2020-04-04 ENCOUNTER — Other Ambulatory Visit: Payer: Self-pay | Admitting: Internal Medicine

## 2020-04-04 DIAGNOSIS — M4802 Spinal stenosis, cervical region: Secondary | ICD-10-CM

## 2020-04-04 DIAGNOSIS — G992 Myelopathy in diseases classified elsewhere: Secondary | ICD-10-CM | POA: Insufficient documentation

## 2020-04-17 NOTE — Progress Notes (Signed)
Cardiology Office Note:    Date:  04/18/2020   ID:  Sheri Morgan, DOB March 11, 1946, MRN [id number]  PCP:  Etta Grandchild, MD  Cardiologist:  No primary care provider on file.  Electrophysiologist:  None   Referring MD: Etta Grandchild, MD   Chief Complaint/Reason for Referral: Atrial fibrillation  History of Present Illness:    Sheri Morgan is a 75 y.o. female with a history of PAF, fibromyalgia, HTN, and palpitations.   She described for her primary care physician a lifelong history of irregular heart rate.  She has been seen previously in the [facility] system and has been noted to have paroxysmal atrial fibrillation.  She is also been noted to have PVCs.  Her last cardiology visit was with Dr. Alonza Bogus in 2018.  She is had an echocardiogram demonstrating mild to moderate TR.  She has a history of hypothyroidism and states that she requires brand-name Synthroid for best control.  She has been noting weight gain and fatigue.  The most difficult activity for her is doing her laundry.  She has noted worsening dyspnea on exertion for 4 to 6 weeks taking her garbage to the curb and notes this at least twice a week.  She said no syncope, no chest pain, though has had some lightheadedness and dizziness.  When she sits at her computer she feels a bit faint. The patient denies chest pain, chest pressure, dyspnea at rest, palpitations, PND, orthopnea.  Describes leg swelling. Denies cough, fever, chills. Denies nausea, vomiting. Denies syncope or presyncope. Denies dizziness or lightheadedness.  Rhythm on ECG is difficult to discern but suggestive of atrial fibrillation.  Past Medical History:  Diagnosis Date  . Anxiety   . Fibromyalgia   . Hypertension   . Irregular heartbeat     Past Surgical History:  Procedure Laterality Date  . ABDOMINAL HYSTERECTOMY    . LUMBAR LAMINECTOMY    . THYROIDECTOMY      Current Medications: Current Meds  Medication Sig  . amitriptyline  (ELAVIL) 25 MG tablet Take 12.5 mg by mouth at bedtime.  . gabapentin (NEURONTIN) 300 MG capsule Take 1 capsule (300 mg total) by mouth 3 (three) times daily as needed.  . hydrochlorothiazide (HYDRODIURIL) 25 MG tablet Take 1 tablet (25 mg total) by mouth daily.  Marland Kitchen levothyroxine (SYNTHROID) 100 MCG tablet Take 100 mcg by mouth daily before breakfast.  . levothyroxine (SYNTHROID) 200 MCG tablet Take 1 tablet (200 mcg total) by mouth daily before breakfast.  . levothyroxine (SYNTHROID) 25 MCG tablet Take 1 tablet (25 mcg total) by mouth daily before breakfast.  . levothyroxine (SYNTHROID) 88 MCG tablet Take 88 mcg by mouth daily before breakfast.  . LORazepam (ATIVAN) 0.5 MG tablet Take 0.5 mg by mouth every 8 (eight) hours.     Allergies:   Codeine   Social History   Tobacco Use  . Smoking status: Never Smoker  . Smokeless tobacco: Never Used  Substance Use Topics  . Alcohol use: No  . Drug use: No     Family History: The patient's family history includes Alcoholism in her father; Diabetes in her mother.  ROS:   Please see the history of present illness.    All other systems reviewed and are negative.  EKGs/Labs/Other Studies Reviewed:    The following studies were reviewed today:  EKG:  Afib RVR vs ectopic atrial tachycardia. PVCs  Recent Labs: 02/05/2020: ALT 13; BUN 17; Creatinine, Ser 1.08; Hemoglobin 13.4; Platelets 191.0;  Potassium 3.7; Sodium 136; TSH 13.96  Recent Lipid Panel    Component Value Date/Time   CHOL 253 (H) 02/05/2020 1141   TRIG 87.0 02/05/2020 1141   HDL 103.20 02/05/2020 1141   CHOLHDL 2 02/05/2020 1141   VLDL 17.4 02/05/2020 1141   LDLCALC 132 (H) 02/05/2020 1141    Physical Exam:    VS:  BP 140/82   Pulse (!) 113   Ht 5' 3.5" (1.613 m)   Wt 214 lb (97.1 kg)   SpO2 98%   BMI 37.31 kg/m     Wt Readings from Last 5 Encounters:  04/18/20 214 lb (97.1 kg)  02/05/20 213 lb (96.6 kg)    Constitutional: No acute distress Eyes: sclera  non-icteric, normal conjunctiva and lids ENMT: normal dentition, moist mucous membranes Cardiovascular: Irregular rhythm, tachycardic rate, no murmurs. S1 and S2 normal. Radial pulses normal bilaterally. No jugular venous distention.  Respiratory: clear to auscultation bilaterally GI : normal bowel sounds, soft and nontender. No distention.   MSK: extremities warm, well perfused.  Trace bilateral edema.  NEURO: grossly nonfocal exam, moves all extremities. PSYCH: alert and oriented x 3, normal mood and affect.   ASSESSMENT:    1. Irregular heart rhythm   2. Dyspnea on exertion   3. Pain of right lower extremity   4. Bilateral lower extremity edema   5. Primary hypertension   6. Hyperlipidemia LDL goal <130    PLAN:    For irregular heart rhythm with a probable history of PAF-we will obtain a cardiac monitor.  If evidence of atrial fibrillation, would recommend initiating anticoagulation.  CHA2DS2-VASc score is approximately 4 for hypertension, age x2, female sex.  Her rates are slightly elevated today and I would recommend metoprolol succinate 25 mg daily.  DOE and bilateral lower extremity edema-with lower extremity swelling and dyspnea on exertion, would recommend echocardiogram to evaluate diastolic function, biventricular systolic function, and right heart pressure.  Hypertension-blood pressure mildly elevated, she takes hydrochlorothiazide 25 mg daily, metoprolol in addition may help lower this closer to goal.  We will evaluate at close follow-up.  Right lower extremity pain-patient concerned about right lower extremity pain, will order DVT ultrasound.   Weston Brass, MD [place]  [facility]    Medication Adjustments/Labs and Tests Ordered: Current medicines are reviewed at length with the patient today.  Concerns regarding medicines are outlined above.   Orders Placed This Encounter  Procedures  . LONG TERM MONITOR (3-14 DAYS)  . EKG 12-Lead  . ECHOCARDIOGRAM  COMPLETE  . VAS [place] LOWER EXTREMITY VENOUS (DVT)    Meds ordered this encounter  Medications  . DISCONTD: metoprolol succinate (TOPROL XL) 25 MG 24 hr tablet    Sig: Take 1 tablet (25 mg total) by mouth daily.    Dispense:  30 tablet    Refill:  3    Patient Instructions  Medication Instructions:  START: METOPROLOL SUCCINATE 25mg  DAILY  *If you need a refill on your cardiac medications before your next appointment, please call your pharmacy*  Testing/Procedures: Your physician has requested that you have an echocardiogram. Echocardiography is a painless test that uses sound waves to create images of your heart. It provides your doctor with information about the size and shape of your heart and how well your heart's chambers and valves are working. You may receive an ultrasound enhancing agent through an IV if needed to better visualize your heart during the echo.This procedure takes approximately one hour. There are no restrictions  for this procedure. This will take place at the [address].   Your physician has requested that you have a lower extremity venous duplex. This test is an ultrasound of the veins in the legs or arms. It looks at venous blood flow that carries blood from the heart to the legs or arms. Allow one hour for a Lower Venous exam. Allow thirty minutes for an Upper Venous exam. There are no restrictions or special instructions.  ZIO XT- Long Term Monitor Instructions   Your physician has requested you wear your ZIO patch monitor 7 days.   This is a single patch monitor.  [employer] supplies one patch monitor per enrollment.  Additional stickers are not available.   Please do not apply patch if you will be having a Nuclear Stress Test, Echocardiogram, Cardiac CT, MRI, or Chest Xray during the time frame you would be wearing the monitor. The patch cannot be worn during these tests.  You cannot remove and re-apply the ZIO XT patch monitor.   Your ZIO patch  monitor will be sent USPS Priority mail from [facility] directly to your home address. The monitor may also be mailed to a PO BOX if home delivery is not available.   It may take 3-5 days to receive your monitor after you have been enrolled.   Once you have received you monitor, please review enclosed instructions.  Your monitor has already been registered assigning a specific monitor serial # to you.   Applying the monitor   Shave hair from upper left chest.   Hold abrader disc by orange tab.  Rub abrader in 40 strokes over left upper chest as indicated in your monitor instructions.   Clean area with 4 enclosed alcohol pads .  Use all pads to assure are is cleaned thoroughly.  Let dry.   Apply patch as indicated in monitor instructions.  Patch will be place under collarbone on left side of chest with arrow pointing upward.   Rub patch adhesive wings for 2 minutes.Remove white label marked "1".  Remove white label marked "2".  Rub patch adhesive wings for 2 additional minutes.   While looking in a mirror, press and release button in center of patch.  A small green light will flash 3-4 times .  This will be your only indicator the monitor has been turned on.     Do not shower for the first 24 hours.  You may shower after the first 24 hours.   Press button if you feel a symptom. You will hear a small click.  Record Date, Time and Symptom in the Patient Log Book.   When you are ready to remove patch, follow instructions on last 2 pages of Patient Log Book.  Stick patch monitor onto last page of Patient Log Book.   Place Patient Log Book in [place] box.  Use locking tab on box and tape box closed securely.  The [employer] has [employer] on it.  Please place in mailbox as soon as possible.  Your physician should have your test results approximately 7 days after the monitor has been mailed back to [facility].   Call [facility] Customer Care at [phone] if you  have questions regarding your ZIO XT patch monitor.  Call them immediately if you see an orange light blinking on your monitor.   If your monitor falls off in less than 4 days contact our Monitor department at [phone].  If your  monitor becomes loose or falls off after 4 days call [employer] at [phone] for suggestions on securing your monitor.   Follow-Up: At [facility], you and your health needs are our priority.  As part of our continuing mission to provide you with exceptional heart care, we have created designated Provider Care Teams.  These Care Teams include your primary Cardiologist (physician) and Advanced Practice Providers (APPs -  Physician Assistants and Nurse Practitioners) who all work together to provide you with the care you need, when you need it.    Your next appointment:   AFTER ECHO IS COMPLETED  The format for your next appointment:   In Person  Provider:   Weston Brass, MD

## 2020-04-18 ENCOUNTER — Encounter: Payer: Self-pay | Admitting: Radiology

## 2020-04-18 ENCOUNTER — Ambulatory Visit (INDEPENDENT_AMBULATORY_CARE_PROVIDER_SITE_OTHER): Payer: Medicare Other

## 2020-04-18 ENCOUNTER — Ambulatory Visit: Payer: Medicare Other | Admitting: Internal Medicine

## 2020-04-18 ENCOUNTER — Encounter: Payer: Self-pay | Admitting: Internal Medicine

## 2020-04-18 ENCOUNTER — Other Ambulatory Visit: Payer: Self-pay

## 2020-04-18 VITALS — BP 140/82 | HR 113 | Ht 63.5 in | Wt 214.0 lb

## 2020-04-18 DIAGNOSIS — E785 Hyperlipidemia, unspecified: Secondary | ICD-10-CM

## 2020-04-18 DIAGNOSIS — R600 Localized edema: Secondary | ICD-10-CM | POA: Diagnosis not present

## 2020-04-18 DIAGNOSIS — I10 Essential (primary) hypertension: Secondary | ICD-10-CM

## 2020-04-18 DIAGNOSIS — M79604 Pain in right leg: Secondary | ICD-10-CM | POA: Diagnosis not present

## 2020-04-18 DIAGNOSIS — I499 Cardiac arrhythmia, unspecified: Secondary | ICD-10-CM

## 2020-04-18 DIAGNOSIS — R0600 Dyspnea, unspecified: Secondary | ICD-10-CM

## 2020-04-18 DIAGNOSIS — R0609 Other forms of dyspnea: Secondary | ICD-10-CM

## 2020-04-18 MED ORDER — METOPROLOL SUCCINATE ER 25 MG PO TB24: 25.0000 mg | ORAL_TABLET | Freq: Every day | ORAL | 3 refills | Status: DC

## 2020-04-18 NOTE — Patient Instructions (Addendum)
Medication Instructions:  START: METOPROLOL SUCCINATE 25mg  DAILY  *If you need a refill on your cardiac medications before your next appointment, please call your pharmacy*  Testing/Procedures: Your physician has requested that you have an echocardiogram. Echocardiography is a painless test that uses sound waves to create images of your heart. It provides your doctor with information about the size and shape of your heart and how well your heart's chambers and valves are working. You may receive an ultrasound enhancing agent through an IV if needed to better visualize your heart during the echo.This procedure takes approximately one hour. There are no restrictions for this procedure. This will take place at the [address], [address].   Your physician has requested that you have a lower extremity venous duplex. This test is an ultrasound of the veins in the legs or arms. It looks at venous blood flow that carries blood from the heart to the legs or arms. Allow one hour for a Lower Venous exam. Allow thirty minutes for an Upper Venous exam. There are no restrictions or special instructions.  ZIO XT- Long Term Monitor Instructions   Your physician has requested you wear your ZIO patch monitor 7 days.   This is a single patch monitor.  [employer] supplies one patch monitor per enrollment.  Additional stickers are not available.   Please do not apply patch if you will be having a Nuclear Stress Test, Echocardiogram, Cardiac CT, MRI, or Chest Xray during the time frame you would be wearing the monitor. The patch cannot be worn during these tests.  You cannot remove and re-apply the ZIO XT patch monitor.   Your ZIO patch monitor will be sent USPS Priority mail from [facility] directly to your home address. The monitor may also be mailed to a PO BOX if home delivery is not available.   It may take 3-5 days to receive your monitor after you have been enrolled.   Once you have received you  monitor, please review enclosed instructions.  Your monitor has already been registered assigning a specific monitor serial # to you.   Applying the monitor   Shave hair from upper left chest.   Hold abrader disc by orange tab.  Rub abrader in 40 strokes over left upper chest as indicated in your monitor instructions.   Clean area with 4 enclosed alcohol pads .  Use all pads to assure are is cleaned thoroughly.  Let dry.   Apply patch as indicated in monitor instructions.  Patch will be place under collarbone on left side of chest with arrow pointing upward.   Rub patch adhesive wings for 2 minutes.Remove white label marked "1".  Remove white label marked "2".  Rub patch adhesive wings for 2 additional minutes.   While looking in a mirror, press and release button in center of patch.  A small green light will flash 3-4 times .  This will be your only indicator the monitor has been turned on.     Do not shower for the first 24 hours.  You may shower after the first 24 hours.   Press button if you feel a symptom. You will hear a small click.  Record Date, Time and Symptom in the Patient Log Book.   When you are ready to remove patch, follow instructions on last 2 pages of Patient Log Book.  Stick patch monitor onto last page of Patient Log Book.   Place Patient Log Book in [place] box.  Use locking tab on box and tape box closed securely.  The [employer] has [employer] on it.  Please place in mailbox as soon as possible.  Your physician should have your test results approximately 7 days after the monitor has been mailed back to [facility].   Call [facility] Customer Care at [phone] if you have questions regarding your ZIO XT patch monitor.  Call them immediately if you see an orange light blinking on your monitor.   If your monitor falls off in less than 4 days contact our Monitor department at [phone].  If your monitor becomes loose or falls off after 4 days  call [employer] at [phone] for suggestions on securing your monitor.   Follow-Up: At [facility], you and your health needs are our priority.  As part of our continuing mission to provide you with exceptional heart care, we have created designated Provider Care Teams.  These Care Teams include your primary Cardiologist (physician) and Advanced Practice Providers (APPs -  Physician Assistants and Nurse Practitioners) who all work together to provide you with the care you need, when you need it.    Your next appointment:   AFTER ECHO IS COMPLETED  The format for your next appointment:   In Person  Provider:   Weston Brass, MD

## 2020-04-18 NOTE — Progress Notes (Signed)
Enrolled patient for a 7 day Zio XT monitor to be mailed to patients home.  

## 2020-05-06 ENCOUNTER — Other Ambulatory Visit: Payer: Self-pay

## 2020-05-06 ENCOUNTER — Ambulatory Visit (HOSPITAL_COMMUNITY)
Admission: RE | Admit: 2020-05-06 | Discharge: 2020-05-06 | Disposition: A | Discharge status: Home / Self Care | Payer: Medicare Other | Source: Ambulatory Visit | Attending: Cardiovascular Disease | Admitting: Cardiovascular Disease

## 2020-05-06 DIAGNOSIS — M79604 Pain in right leg: Secondary | ICD-10-CM | POA: Diagnosis not present

## 2020-05-06 DIAGNOSIS — R600 Localized edema: Secondary | ICD-10-CM | POA: Insufficient documentation

## 2020-05-09 ENCOUNTER — Telehealth: Payer: Self-pay | Admitting: Internal Medicine

## 2020-05-09 DIAGNOSIS — I499 Cardiac arrhythmia, unspecified: Secondary | ICD-10-CM | POA: Diagnosis not present

## 2020-05-09 DIAGNOSIS — I4891 Unspecified atrial fibrillation: Secondary | ICD-10-CM

## 2020-05-09 MED ORDER — APIXABAN 5 MG PO TABS: 5.0000 mg | ORAL_TABLET | Freq: Two times a day (BID) | ORAL | 3 refills | Status: DC

## 2020-05-09 NOTE — Telephone Encounter (Signed)
Received a call from [facility] with [employer] calling to report on 04/29/20 and 05/02/20 patient had slow aflutter rate 38 lasting 38 sec and fast aflutter rate 212 lasting 60 sec.Report has been sent to Dr.Acharya.

## 2020-05-09 NOTE — Telephone Encounter (Signed)
Spoke with patient, advised that heart monitor showed Atrial Fib/Flutter. Prescription for Eliquis 5mg  BID has been sent into the pharmacy. Advised patient of importance of taking Eliquis. Spoke with patients pharmacy as well and Eliquis will cost patient 37$ for a month supply. Patient states she is okay with this. Advised patient not to take Metoprolol due to pauses seen on monitor patient verbalized understanding. Referral placed for EP at next available. Advised patient to call back to office with any issues, questions, or concerns. Patient verbalized understanding of all instructions.

## 2020-05-09 NOTE — Telephone Encounter (Signed)
Reference # [id number]

## 2020-05-09 NOTE — Addendum Note (Signed)
Addended by: Bea Laura B on: 05/09/2020 04:41 PM   Modules accepted: Orders

## 2020-05-09 NOTE — Telephone Encounter (Signed)
Sheri Morgan, please call [name] and let her know her monitor strips look like atrial fibrillation/atrial flutter. Let's have a referral to afib clinic or EP (first available), and lets get her started on eliquis 5 mg BID.   She should hold metoprolol until follow up, she is having 3.3 second pauses.

## 2020-05-15 ENCOUNTER — Other Ambulatory Visit (HOSPITAL_COMMUNITY): Payer: Medicare Other

## 2020-05-15 ENCOUNTER — Encounter (HOSPITAL_COMMUNITY): Payer: Self-pay | Admitting: Internal Medicine

## 2020-05-30 ENCOUNTER — Encounter: Payer: Self-pay | Admitting: Internal Medicine

## 2020-05-30 ENCOUNTER — Other Ambulatory Visit: Payer: Self-pay

## 2020-05-30 ENCOUNTER — Ambulatory Visit: Payer: Medicare Other | Admitting: Internal Medicine

## 2020-05-30 VITALS — BP 140/80 | HR 84 | Ht 63.5 in | Wt 214.6 lb

## 2020-05-30 DIAGNOSIS — I4891 Unspecified atrial fibrillation: Secondary | ICD-10-CM

## 2020-05-30 DIAGNOSIS — I10 Essential (primary) hypertension: Secondary | ICD-10-CM

## 2020-05-30 NOTE — Progress Notes (Signed)
Electrophysiology Office Note   Date:  06/02/2020   ID:  Julianny, Milstein 1945-07-09, MRN [id number]  PCP:  Etta Grandchild, MD  Cardiologist:  Dr Annamaria Helling Primary Electrophysiologist: Hillis Range, MD    CC: afib   History of Present Illness: Sheri Morgan is a 75 y.o. female who presents today for electrophysiology evaluation.   She is referred by Dr Annamaria Helling for EP consultation regarding afib. The patient was initially diagnosed with afib after presenting with dizziness and palpitations.  + SOB. She is quite confused today about her appointment.  She recently did not show for her echo appointment and thought that todays visit was for her echo to be obtained.  She had difficulty focusing on the visit due to this distraction.   Past Medical History:  Diagnosis Date  . Anxiety   . Atypical atrial flutter (HCC)   . Fibromyalgia   . Hypertension   . Paroxysmal atrial fibrillation (HCC)   . Premature atrial contractions   . Premature ventricular contraction    Past Surgical History:  Procedure Laterality Date  . ABDOMINAL HYSTERECTOMY    . LUMBAR LAMINECTOMY    . THYROIDECTOMY       Current Outpatient Medications  Medication Sig Dispense Refill  . apixaban (ELIQUIS) 5 MG TABS tablet Take 1 tablet (5 mg total) by mouth 2 (two) times daily. 60 tablet 3  . gabapentin (NEURONTIN) 300 MG capsule Take 1 capsule (300 mg total) by mouth 3 (three) times daily as needed. 90 capsule 0  . hydrochlorothiazide (HYDRODIURIL) 25 MG tablet Take 1 tablet (25 mg total) by mouth daily. 90 tablet 1  . levothyroxine (SYNTHROID) 200 MCG tablet Take 1 tablet (200 mcg total) by mouth daily before breakfast. 90 tablet 0  . LORazepam (ATIVAN) 0.5 MG tablet Take 0.5 mg by mouth every 8 (eight) hours.    . rosuvastatin (CRESTOR) 10 MG tablet Take 1 tablet (10 mg total) by mouth daily. 90 tablet 1   No current facility-administered medications for this visit.    Allergies:   Codeine   Social  History:  The patient  reports that she has never smoked. She has never used smokeless tobacco. She reports that she does not drink alcohol and does not use drugs.   Family History:  The patient's  family history includes Alcoholism in her father; Diabetes in her mother.    ROS:  Please see the history of present illness.   All other systems are personally reviewed and negative.    PHYSICAL EXAM: VS:  BP 140/80   Pulse 84   Ht 5' 3.5" (1.613 m)   Wt 214 lb 9.6 oz (97.3 kg)   SpO2 96%   BMI 37.42 kg/m  , BMI Body mass index is 37.42 kg/m. GEN: Well nourished, well developed, in no acute distress HEENT: normal Neck: no JVD, carotid bruits, or masses Cardiac: RRR; no murmurs, rubs, or gallops,no edema  Respiratory:  clear to auscultation bilaterally, normal work of breathing GI: soft, nontender, nondistended, + BS MS: no deformity or atrophy Skin: warm and dry  Neuro:  Strength and sensation are intact Psych: euthymic mood, full affect  Recent Labs: 02/05/2020: ALT 13; BUN 17; Creatinine, Ser 1.08; Hemoglobin 13.4; Platelets 191.0; Potassium 3.7; Sodium 136; TSH 13.96  personally reviewed   Lipid Panel     Component Value Date/Time   CHOL 253 (H) 02/05/2020 1141   TRIG 87.0 02/05/2020 1141   HDL 103.20 02/05/2020 1141  CHOLHDL 2 02/05/2020 1141   VLDL 17.4 02/05/2020 1141   LDLCALC 132 (H) 02/05/2020 1141   personally reviewed   Wt Readings from Last 3 Encounters:  05/30/20 214 lb 9.6 oz (97.3 kg)  04/18/20 214 lb (97.1 kg)  02/05/20 213 lb (96.6 kg)      Other studies personally reviewed: Additional studies/ records that were reviewed today include: Dr Franklyn Lor notes (incomplete)  Review of the above records today demonstrates: as above  ASSESSMENT AND PLAN:  1.  Paroxysmal atrial fibrillation/ atypical atrial flutter  Chads2vasc score is 4.  she is anticoagulated with eliquis . Therapeutic strategies for afib including medicine and ablation were discussed  in detail with the patient today. Risk, benefits, and alternatives to each approach were discussed.  She is clear at this time that she is not interested in medicine or ablation. I think that flecainide may be her best option currently.  She is very distracted because she thought that todays visit was to have her echo obtained rather than an EP consult. She wishes to have her echo results before making any further decisions.  No changes today  Risks, benefits and potential toxicities for medications prescribed and/or refilled reviewed with patient today.   2. HTN Stable No change required today   Follow-up:  Return to see EP PA in several weeks.  Could consider flecainide if EF is ok by echo I will see as needed going forward  Current medicines are reviewed at length with the patient today.   The patient does not have concerns regarding her medicines.  The following changes were made today:  none   Signed, Hillis Range, MD    [facility] [address] [place] [address] [phone] (office) [phone] (fax)

## 2020-05-30 NOTE — Patient Instructions (Addendum)
Medication Instructions:  Your physician recommends that you continue on your current medications as directed. Please refer to the Current Medication list given to you today.  Labwork: None ordered.  Testing/Procedures: None ordered.  Follow-Up: Your physician wants you to follow-up in: 06/27/20 at 11:45 pm    Casimiro Needle "Mardelle Matte" Lanna Poche, PA-C      Any Other Special Instructions Will Be Listed Below (If Applicable).  If you need a refill on your cardiac medications before your next appointment, please call your pharmacy.

## 2020-06-02 ENCOUNTER — Encounter: Payer: Self-pay | Admitting: Internal Medicine

## 2020-06-03 NOTE — Addendum Note (Signed)
Addended by: Elena Cothern H on: 06/03/2020 09:04 AM   Modules accepted: Orders  

## 2020-06-10 ENCOUNTER — Other Ambulatory Visit (HOSPITAL_COMMUNITY): Payer: Medicare Other

## 2020-06-10 ENCOUNTER — Telehealth (HOSPITAL_COMMUNITY): Payer: Self-pay | Admitting: Internal Medicine

## 2020-06-10 NOTE — Progress Notes (Signed)
Cardiology Office Note:    Date:  06/11/2020   ID:  Sheri Morgan, Sheri Morgan [birth year], MRN [id number]  PCP:  Etta Grandchild, MD  Cardiologist:  No primary care provider on file.  Electrophysiologist:  None   Referring MD: Etta Grandchild, MD   Chief Complaint/Reason for Referral: PAF, HTN  History of Present Illness:    Sheri Morgan is a 75 y.o. adult with a history of PAF, fibromyalgia, HTN, and palpitations.   Chest twinge improves with belching and flatus.   Continues to have dyspnea on exertion though she attributes this to deconditioning and weight gain.  She feels she is the heaviest she has ever been, and feels this is contributing.  Unfortunately she missed her appointment for echocardiogram, we will reschedule this to evaluate for structural heart disease in the setting of what may be persistent atrial fibrillation at this point.  ECG today demonstrates atrial fibrillation with a rate of 106 and occasional PVCs.  She denies exertional chest pain and denies significant palpitations.  Past Medical History:  Diagnosis Date  . Anxiety   . Atypical atrial flutter (HCC)   . Fibromyalgia   . Hypertension   . Paroxysmal atrial fibrillation (HCC)   . Premature atrial contractions   . Premature ventricular contraction     Past Surgical History:  Procedure Laterality Date  . ABDOMINAL HYSTERECTOMY    . LUMBAR LAMINECTOMY    . THYROIDECTOMY      Current Medications: Current Meds  Medication Sig  . apixaban (ELIQUIS) 5 MG TABS tablet Take 1 tablet (5 mg total) by mouth 2 (two) times daily.  Marland Kitchen gabapentin (NEURONTIN) 300 MG capsule Take 1 capsule (300 mg total) by mouth 3 (three) times daily as needed.  . hydrochlorothiazide (HYDRODIURIL) 25 MG tablet Take 1 tablet (25 mg total) by mouth daily.  Marland Kitchen levothyroxine (SYNTHROID) 200 MCG tablet Take 1 tablet (200 mcg total) by mouth daily before breakfast.  . LORazepam (ATIVAN) 0.5 MG tablet Take 0.5 mg by mouth every 8 (eight)  hours.  . rosuvastatin (CRESTOR) 10 MG tablet Take 1 tablet (10 mg total) by mouth daily.     Allergies:   Codeine   Social History   Tobacco Use  . Smoking status: Never Smoker  . Smokeless tobacco: Never Used  Substance Use Topics  . Alcohol use: No  . Drug use: No     Family History: The patient's family history includes Alcoholism in [place]. Haberle's father; Diabetes in [place]. Goodhart's mother.  ROS:   Please see the history of present illness.    All other systems reviewed and are negative.  EKGs/Labs/Other Studies Reviewed:    The following studies were reviewed today:  EKG: Atrial fibrillation with rapid ventricular response, PVCs, septal infarct pattern.  Recent Labs: 02/05/2020: ALT 13; BUN 17; Creatinine, Ser 1.08; Hemoglobin 13.4; Platelets 191.0; Potassium 3.7; Sodium 136; TSH 13.96  Recent Lipid Panel    Component Value Date/Time   CHOL 253 (H) 02/05/2020 1141   TRIG 87.0 02/05/2020 1141   HDL 103.20 02/05/2020 1141   CHOLHDL 2 02/05/2020 1141   VLDL 17.4 02/05/2020 1141   LDLCALC 132 (H) 02/05/2020 1141    Physical Exam:    VS:  BP 130/74   Pulse (!) 110   Ht 5\' 4"  (1.626 m)   Wt 213 lb 12.8 oz (97 kg)   SpO2 99%   BMI 36.70 kg/m     Wt Readings from Last 5 Encounters:  06/11/20 213 lb 12.8 oz (97 kg)  05/30/20 214 lb 9.6 oz (97.3 kg)  04/18/20 214 lb (97.1 kg)  02/05/20 213 lb (96.6 kg)    Constitutional: No acute distress Eyes: sclera non-icteric, normal conjunctiva and lids ENMT: normal dentition, moist mucous membranes Cardiovascular: Irregular rhythm, tachycardic rate, no murmurs. S1 and S2 normal. Radial pulses normal bilaterally. No jugular venous distention.  Respiratory: clear to auscultation bilaterally GI : normal bowel sounds, soft and nontender. No distention.   MSK: extremities warm, well perfused.  Trace bilateral edema.  NEURO: grossly nonfocal exam, moves all extremities. PSYCH: alert and oriented x 3, normal mood and  affect.   ASSESSMENT:    1. Atrial fibrillation, unspecified type (HCC)   2. Dyspnea on exertion   3. Essential hypertension   4. Pain of right lower extremity   5. Bilateral lower extremity edema   6. Primary hypertension    PLAN:    Atrial fibrillation, unspecified type (HCC) - Plan: EKG 12-Lead, ECHOCARDIOGRAM COMPLETE, CANCELED: EKG 12-Lead  -She has an appointment with Otilio Saber, PA in EP in a few weeks.  We will try to accomplish her echocardiogram prior to this.  This is to evaluate for structural heart disease, biventricular function, atrial size to best guide next steps for atrial fibrillation.  I suspect her A. fib is part of the reason she has dyspnea on exertion.  Cardiac monitor demonstrates atrial flutter.  Dyspnea on exertion -would like to start with an echocardiogram to guide neck steps, though I think an ischemic evaluation may be warranted.  She feels her dyspnea on exertion is related to lifestyle factors including deconditioning and weight gain.  Cannot entirely exclude ischemia as an etiology for dyspnea on exertion.  If flecainide is considered as noted in Dr. Jenel Lucks most recent note, ischemic testing would likely be warranted and can be accomplished with nuclear stress test.  Essential hypertension-mildly elevated on hydrochlorothiazide 25 mg daily, would consider adding a beta-blocker however she did have bradycardia on monitor.  Referral was placed to EP to consider rhythm control strategies since rate control may be challenging given bradycardia and tachycardia.  Bilateral lower extremity edema-may be secondary to diastolic dysfunction and venous insufficiency.  Will check echo for completeness.   Total time of encounter: 30 minutes total time of encounter, including 20 minutes spent in face-to-face patient care on the date of this encounter. This time includes coordination of care and counseling regarding above mentioned problem list. Remainder of  non-face-to-face time involved reviewing chart documents/testing relevant to the patient encounter and documentation in the medical record. I have independently reviewed documentation from referring provider.   Weston Brass, MD, [facility]  [facility]    Medication Adjustments/Labs and Tests Ordered: Current medicines are reviewed at length with the patient today.  Concerns regarding medicines are outlined above.   Orders Placed This Encounter  Procedures  . EKG 12-Lead  . ECHOCARDIOGRAM COMPLETE    No orders of the defined types were placed in this encounter.   Patient Instructions  Medication Instructions:  No Changes In Medications at this time.  *If you need a refill on your cardiac medications before your next appointment, please call your pharmacy*  Testing/Procedures: RESCHEDULE ECHOCARDIOGRAM-POSSIBLY 15th? Your physician has requested that you have an echocardiogram. Echocardiography is a painless test that uses sound waves to create images of your heart. It provides your doctor with information about the size and shape of your heart and how well your heart's  chambers and valves are working. You may receive an ultrasound enhancing agent through an IV if needed to better visualize your heart during the echo.This procedure takes approximately one hour. There are no restrictions for this procedure. This will take place at the [address].   Follow-Up: At [facility], you and your health needs are our priority.  As part of our continuing mission to provide you with exceptional heart care, we have created designated Provider Care Teams.  These Care Teams include your primary Cardiologist (physician) and Advanced Practice Providers (APPs -  Physician Assistants and Nurse Practitioners) who all work together to provide you with the care you need, when you need it.  Your next appointment:   2-3 Months  The format for your next appointment:   In  Person  Provider:   Weston Brass, MD

## 2020-06-10 NOTE — Telephone Encounter (Signed)
Just an FYI. We have made several attempts to contact this patient including sending a letter to schedule or reschedule their echocardiogram. We will be removing the patient from the echo WQ.   05/15/20 NO SHOWED -MAILED LETTER LBW     Thank you 

## 2020-06-11 ENCOUNTER — Other Ambulatory Visit: Payer: Self-pay

## 2020-06-11 ENCOUNTER — Ambulatory Visit: Payer: Medicare Other | Admitting: Internal Medicine

## 2020-06-11 ENCOUNTER — Encounter: Payer: Self-pay | Admitting: Internal Medicine

## 2020-06-11 VITALS — BP 130/74 | HR 110 | Ht 64.0 in | Wt 213.8 lb

## 2020-06-11 DIAGNOSIS — R0600 Dyspnea, unspecified: Secondary | ICD-10-CM | POA: Diagnosis not present

## 2020-06-11 DIAGNOSIS — I10 Essential (primary) hypertension: Secondary | ICD-10-CM | POA: Diagnosis not present

## 2020-06-11 DIAGNOSIS — R600 Localized edema: Secondary | ICD-10-CM

## 2020-06-11 DIAGNOSIS — R0609 Other forms of dyspnea: Secondary | ICD-10-CM

## 2020-06-11 DIAGNOSIS — I4891 Unspecified atrial fibrillation: Secondary | ICD-10-CM | POA: Diagnosis not present

## 2020-06-11 DIAGNOSIS — M79604 Pain in right leg: Secondary | ICD-10-CM

## 2020-06-11 NOTE — Patient Instructions (Signed)
Medication Instructions:  No Changes In Medications at this time.  *If you need a refill on your cardiac medications before your next appointment, please call your pharmacy*  Testing/Procedures: RESCHEDULE ECHOCARDIOGRAM-POSSIBLY 15th? Your physician has requested that you have an echocardiogram. Echocardiography is a painless test that uses sound waves to create images of your heart. It provides your doctor with information about the size and shape of your heart and how well your heart's chambers and valves are working. You may receive an ultrasound enhancing agent through an IV if needed to better visualize your heart during the echo.This procedure takes approximately one hour. There are no restrictions for this procedure. This will take place at the [address], [address].   Follow-Up: At [facility], you and your health needs are our priority.  As part of our continuing mission to provide you with exceptional heart care, we have created designated Provider Care Teams.  These Care Teams include your primary Cardiologist (physician) and Advanced Practice Providers (APPs -  Physician Assistants and Nurse Practitioners) who all work together to provide you with the care you need, when you need it.  Your next appointment:   2-3 Months  The format for your next appointment:   In Person  Provider:   Weston Brass, MD

## 2020-06-27 ENCOUNTER — Ambulatory Visit: Payer: Medicare Other | Admitting: Student

## 2020-07-14 ENCOUNTER — Other Ambulatory Visit: Payer: Self-pay

## 2020-07-14 ENCOUNTER — Ambulatory Visit (HOSPITAL_COMMUNITY): Payer: Medicare Other | Attending: Cardiovascular Disease

## 2020-07-14 DIAGNOSIS — I4891 Unspecified atrial fibrillation: Secondary | ICD-10-CM | POA: Insufficient documentation

## 2020-07-14 LAB — ECHOCARDIOGRAM COMPLETE
Area-P 1/2: 4.14 cm2
S' Lateral: 4.4 cm

## 2020-08-20 ENCOUNTER — Telehealth: Payer: Self-pay | Admitting: Internal Medicine

## 2020-08-20 NOTE — Telephone Encounter (Signed)
Attempted to call patient, left message for patient to call back to office.   Per Patient's recent echo results:  Parke Poisson, MD  07/31/2020 10:56 AM EDT      EF is reduced, which is a new finding. I think next best step would be to follow up with EP, I think she may have missed our cancelled her last appointment with them.

## 2020-08-20 NOTE — Telephone Encounter (Signed)
New message   Pt was called about scheduling appt with EP per Dr. Jacques Navy. She states she is unsure why she needs an appt because she feels fine. She would like to discuss with RN

## 2020-08-21 NOTE — Telephone Encounter (Signed)
Attempted to call patient, left message for patient to call back to office.   

## 2020-08-22 NOTE — Telephone Encounter (Signed)
Left message for pt to call.

## 2020-09-09 ENCOUNTER — Ambulatory Visit: Payer: Medicare Other | Admitting: Internal Medicine

## 2020-09-09 NOTE — Progress Notes (Incomplete)
Cardiology Office Note:    Date:  09/09/2020   ID:  Sheri Morgan, DOB [birth year], MRN [id number]  PCP:  Janith Lima, MD  Cardiologist:  Elouise Munroe, MD  Electrophysiologist:  None   Referring MD: Janith Lima, MD   Chief Complaint/Reason for Referral: PAF, HTN  History of Present Illness:    Sheri Morgan is a 75 y.o. adult with a history of PAF, fibromyalgia, HTN, and palpitations.   Chest twinge improves with belching and flatus.   Continues to have dyspnea on exertion though she attributes this to deconditioning and weight gain.  She feels she is the heaviest she has ever been, and feels this is contributing.  Unfortunately she missed her appointment for echocardiogram, we will reschedule this to evaluate for structural heart disease in the setting of what may be persistent atrial fibrillation at this point.  ECG today demonstrates atrial fibrillation with a rate of 106 and occasional PVCs.  She denies exertional chest pain and denies significant palpitations.  Today,  She denies any chest pain, shortness of breath, palpitations, or exertional symptoms. No headaches, lightheadedness, or syncope to report. Also has no lower extremity edema, orthopnea or PND.   Past Medical History:  Diagnosis Date   Anxiety    Atypical atrial flutter (HCC)    Fibromyalgia    Hypertension    Paroxysmal atrial fibrillation (HCC)    Premature atrial contractions    Premature ventricular contraction     Past Surgical History:  Procedure Laterality Date   ABDOMINAL HYSTERECTOMY     LUMBAR LAMINECTOMY     THYROIDECTOMY      Current Medications: No outpatient medications have been marked as taking for the 09/09/20 encounter (Appointment) with Elouise Munroe, MD.     Allergies:   Codeine   Social History   Tobacco Use   Smoking status: Never   Smokeless tobacco: Never  Substance Use Topics   Alcohol use: No   Drug use: No     Family History: The  patient's family history includes Alcoholism in [place] father; Diabetes in [place] mother.  ROS:   Please see the history of present illness.    (+) All other systems reviewed and are negative.  EKGs/Labs/Other Studies Reviewed:    The following studies were reviewed today:  Echo 07/14/2020:  1. Left ventricular ejection fraction, by estimation, is 30 to 35%. The  left ventricle has moderately decreased function. The left ventricle  demonstrates global hypokinesis. Left ventricular diastolic parameters are  indeterminate.   2. Right ventricular systolic function is normal. The right ventricular  size is normal. There is mildly elevated pulmonary artery systolic  pressure.   3. Left atrial size was severely dilated.   4. The mitral valve is normal in structure. Trivial mitral valve  regurgitation. No evidence of mitral stenosis.   5. The aortic valve is tricuspid. Aortic valve regurgitation is not  visualized. No aortic stenosis is present.   6. The inferior vena cava is normal in size with greater than 50%  respiratory variability, suggesting right atrial pressure of 3 mmHg.   Monitor 05/09/2020: Indication: Afib evaluation   Minimum HR (bpm): 35 Maximum HR (bpm): 229   Supraventricular Ectopy: none noted SVT: none   Ventricular Ectopy: frequent PVCs, 6.4% NSVT: 3 episodes, fastest 5 beats rate 222 bpm, longest 9 beats rate of 123 bpm Ventricular Tachycardia: no sustained VT   Pauses: 2 pauses, 3.3 seconds while in  atrial flutter   Atrial fibrillation: 100% burden of atrial flutter, ventricular rate varying from 35-229 bpm   Diary events: none   IMPRESSION: Persistent atrial flutter with rapid and slow ventricular response.   DATA: Patch Wear Time:  5 days and 1 hours (2022-02-13T14:36:05-498 to 2022-02-18T16:19:17-0500)   3 Ventricular Tachycardia runs occurred, the run with the fastest interval lasting 5 beats with a max rate of 222 bpm, the  longest lasting 9 beats with an avg rate of 123 bpm. Atrial Flutter occurred continuously (100% burden), ranging from 35-229 bpm (avg of 73 bpm). Atrial Flutter may be possible Atrial Tachycardia with variable block. 2 Pauses occurred, the longest lasting 3.3 secs (18 bpm). Isolated VEs were frequent (6.4%, [id number]), VE Couplets were rare (<1.0%, 1857), and VE Triplets were rare (<1.0%, 90). Ventricular Bigeminy and Trigeminy were present. MD notification criteria for Slow and Rapid Atrial Flutter met - report posted prior to notification per account request (GR).  [place] LE Venous 05/06/2020: RIGHT:  - Findings consistent with age indeterminate deep vein thrombosis  involving the right peroneal veins.  - No cystic structure found in the popliteal fossa.     LEFT:  - No evidence of deep vein thrombosis in the lower extremity. No indirect  evidence of obstruction proximal to the inguinal ligament.  - No cystic structure found in the popliteal fossa.   EKG:  09/09/2020: EKG is not ordered today. 06/11/2020: Atrial fibrillation with rapid ventricular response, PVCs, septal infarct pattern.  Recent Labs: 02/05/2020: ALT 13; BUN 17; Creatinine, Ser 1.08; Hemoglobin 13.4; Platelets 191.0; Potassium 3.7; Sodium 136; TSH 13.96  Recent Lipid Panel    Component Value Date/Time   CHOL 253 (H) 02/05/2020 1141   TRIG 87.0 02/05/2020 1141   HDL 103.20 02/05/2020 1141   CHOLHDL 2 02/05/2020 1141   VLDL 17.4 02/05/2020 1141   LDLCALC 132 (H) 02/05/2020 1141    Physical Exam:    VS:  There were no vitals taken for this visit.    Wt Readings from Last 5 Encounters:  06/11/20 213 lb 12.8 oz (97 kg)  05/30/20 214 lb 9.6 oz (97.3 kg)  04/18/20 214 lb (97.1 kg)  02/05/20 213 lb (96.6 kg)    Constitutional: No acute distress Eyes: sclera non-icteric, normal conjunctiva and lids ENMT: normal dentition, moist mucous membranes Cardiovascular: Irregular rhythm***, tachycardic rate***, no murmurs. S1 and S2  normal. Radial pulses normal bilaterally. No jugular venous distention.  Respiratory: clear to auscultation bilaterally GI : normal bowel sounds, soft and nontender. No distention.   MSK: extremities warm, well perfused.  Trace bilateral edema***.  NEURO: grossly nonfocal exam, moves all extremities. PSYCH: alert and oriented x 3, normal mood and affect.   ASSESSMENT:    No diagnosis found.  PLAN:    Atrial fibrillation, unspecified type ([place]) - Plan: EKG 12-Lead, ECHOCARDIOGRAM COMPLETE, CANCELED: EKG 12-Lead  -She has an appointment with Oda Kilts, PA in EP in a few weeks.  We will try to accomplish her echocardiogram prior to this.  This is to evaluate for structural heart disease, biventricular function, atrial size to best guide next steps for atrial fibrillation.  I suspect her A. fib is part of the reason she has dyspnea on exertion.  Cardiac monitor demonstrates atrial flutter.  Dyspnea on exertion -would like to start with an echocardiogram to guide neck steps, though I think an ischemic evaluation may be warranted.  She feels her dyspnea on exertion is related to lifestyle factors including deconditioning and  weight gain.  Cannot entirely exclude ischemia as an etiology for dyspnea on exertion.  If flecainide is considered as noted in Dr. Jackalyn Lombard most recent note, ischemic testing would likely be warranted and can be accomplished with nuclear stress test.  Essential hypertension-mildly elevated on hydrochlorothiazide 25 mg daily, would consider adding a beta-blocker however she did have bradycardia on monitor.  Referral was placed to EP to consider rhythm control strategies since rate control may be challenging given bradycardia and tachycardia.  Bilateral lower extremity edema-may be secondary to diastolic dysfunction and venous insufficiency.  Will check echo for completeness.  ***Plan: : -  : -  -  Follow-up in *** months.   Total time of encounter: *** minutes  total time of encounter, including *** minutes spent in face-to-face patient care on the date of this encounter. This time includes coordination of care and counseling regarding above mentioned problem list. Remainder of non-face-to-face time involved reviewing chart documents/testing relevant to the patient encounter and documentation in the medical record. I have independently reviewed documentation from referring provider.   Cherlynn Kaiser, MD, [facility]    Medication Adjustments/Labs and Tests Ordered: Current medicines are reviewed at length with the patient today.  Concerns regarding medicines are outlined above.   No orders of the defined types were placed in this encounter.   No orders of the defined types were placed in this encounter.   There are no Patient Instructions on file for this visit.   I,Mathew Stumpf,acting as a Education administrator for Elouise Munroe, MD.,have documented all relevant documentation on the behalf of Elouise Munroe, MD,as directed by  Elouise Munroe, MD while in the presence of Elouise Munroe, MD.  ***

## 2020-09-29 ENCOUNTER — Other Ambulatory Visit: Payer: Self-pay | Admitting: Internal Medicine

## 2020-09-29 DIAGNOSIS — I10 Essential (primary) hypertension: Secondary | ICD-10-CM

## 2020-11-12 ENCOUNTER — Ambulatory Visit (INDEPENDENT_AMBULATORY_CARE_PROVIDER_SITE_OTHER): Payer: Medicare Other | Admitting: *Deleted

## 2020-11-12 DIAGNOSIS — Z0000 Encounter for general adult medical examination without abnormal findings: Secondary | ICD-10-CM | POA: Diagnosis not present

## 2020-11-12 NOTE — Progress Notes (Deleted)
I connected with  Sheri Morgan on 11/12/20 by a audio and verified that I am speaking with the correct person using two identifiers.   I discussed the limitations of evaluation and management by telemedicine. The patient expressed understanding and agreed to proceed.   Location of Patient: Home Location of Provider: Office  Risk Factors/Conditions needing evaluation or treatment: Pt. Does not have any new risk factors that need evaluation. Preventative care discussed for current age related risk factors.  Appropriate orders/referrals made.  Health maintenance and immunization schedule reviewed and provided to patient.   Home Safety: Pt lives at home, alone in a 1 story home.  Pt reports 4 smoke alarms and 0 adaptive equipment.  Other Information: Corrective lens: Pt wears corrective lens, has had a recent eye exams. Dentures: Pt does wear a partial dentures, and needs a dental exam. Memory: Pt has no memory problems. Bladder:  Pt notes some problems with bladder control.  BMI/Exercise: We discussed BMI and strategies for weight  loss including healthy meals.  We also discussed  a regular exercise routine. She currently exercise 1-2 times a week. Med Adherence:  We discussed importance of taking medications.  Pt reports missing 1 day in the past week.  ADL/IADL:  Pt reports she is well in all functions. See functional status assessment.  Balance/Gait: Pt reports no falls in the past year.  We discussed home safety and fall prevention.  See fall prevention assessment.    Annual Wellness Visit Requirements Recorded Today In  Medical, family, social history Past Medical, Family, Social History Section  Current providers Care team  Current medications Medications  Wt, BP, Ht, BMI Vital signs  Visual acuity (welcome visit) N/A  Hearing assessment (welcome visit) N/A  Tobacco, alcohol, illicit drug use History  ADL Nurse Assessment  Depression Screening Nurse Assessment  Cognitive  impairment Document Flowsheet  Mini Mental Status Document Flowsheet  Fall Risk Fall/Depression  Home Safety Progress Note  End of Life Planning (welcome visit) Social Documentation  Medicare preventative services Health Maintenance  Risk factors/conditions needing evaluation/treatment Progress Note  Personalized health advice Patient Instructions, goals, letter  Diet & Exercise Social Documentation  Emergency Contact Social Documentation  Seat Belts Social Documentation  Sun exposure/protection Social Documentation  .

## 2020-11-12 NOTE — Patient Instructions (Addendum)
Sheri Morgan , Thank you for taking time to come for your Medicare Wellness Visit. I appreciate your ongoing commitment to your health goals. Please review the following plan we discussed and let me know if I can assist you in the future.   These are the goals we discussed:  Goals   None     This is a list of the screening recommended for you and due dates:  Health Maintenance  Topic Date Due  . COVID-19 Vaccine (3 - Booster for Pfizer series) 11/28/2020*  . DEXA scan (bone density measurement)  12/15/2020*  . Zoster (Shingles) Vaccine (1 of 2) 02/11/2021*  . Flu Shot  06/12/2021*  . Tetanus Vaccine  11/12/2021*  . Hepatitis C Screening: USPSTF Recommendation to screen - Ages 18-79 yo.  11/12/2021*  . Pneumonia vaccines (1 of 2 - PCV13) 11/12/2021*  . HPV Vaccine  Aged Out  *Topic was postponed. The date shown is not the original due date.    Talk with your provider about the DEXA scan & confirm if you have have one through [facility]. Health Maintenance, Female Adopting a healthy lifestyle and getting preventive care are important in promoting health and wellness. Ask your health care provider about: The right schedule for you to have regular tests and exams. Things you can do on your own to prevent diseases and keep yourself healthy. What should I know about diet, weight, and exercise? Eat a healthy diet  Eat a diet that includes plenty of vegetables, fruits, low-fat dairy products, and lean protein. Do not eat a lot of foods that are high in solid fats, added sugars, or sodium. Maintain a healthy weight Body mass index (BMI) is used to identify weight problems. It estimates body fat based on height and weight. Your health care provider can help determine your BMI and help you achieve or maintain a healthy weight. Get regular exercise Get regular exercise. This is one of the most important things you can do for your health. Most adults should: Exercise for at least 150 minutes each  week. The exercise should increase your heart rate and make you sweat (moderate-intensity exercise). Do strengthening exercises at least twice a week. This is in addition to the moderate-intensity exercise. Spend less time sitting. Even light physical activity can be beneficial. Watch cholesterol and blood lipids Have your blood tested for lipids and cholesterol at 77 years of age, then have this test every 5 years. Have your cholesterol levels checked more often if: Your lipid or cholesterol levels are high. You are older than 77 years of age. You are at high risk for heart disease. What should I know about cancer screening? Depending on your health history and family history, you may need to have cancer screening at various ages. This may include screening for: Breast cancer. Cervical cancer. Colorectal cancer. Skin cancer. Lung cancer. What should I know about heart disease, diabetes, and high blood pressure? Blood pressure and heart disease High blood pressure causes heart disease and increases the risk of stroke. This is more likely to develop in people who have high blood pressure readings, are of African descent, or are overweight. Have your blood pressure checked: Every 3-5 years if you are 7-64 years of age. Every year if you are 68 years old or older. Diabetes Have regular diabetes screenings. This checks your fasting blood sugar level. Have the screening done: Once every three years after age 34 if you are at a normal weight and have a low  risk for diabetes. More often and at a younger age if you are overweight or have a high risk for diabetes. What should I know about preventing infection? Hepatitis B If you have a higher risk for hepatitis B, you should be screened for this virus. Talk with your health care provider to find out if you are at risk for hepatitis B infection. Hepatitis C Testing is recommended for: Everyone born from [birth year] through 1967. Anyone with known  risk factors for hepatitis C. Sexually transmitted infections (STIs) Get screened for STIs, including gonorrhea and chlamydia, if: You are sexually active and are younger than 77 years of age. You are older than 77 years of age and your health care provider tells you that you are at risk for this type of infection. Your sexual activity has changed since you were last screened, and you are at increased risk for chlamydia or gonorrhea. Ask your health care provider if you are at risk. Ask your health care provider about whether you are at high risk for HIV. Your health care provider may recommend a prescription medicine to help prevent HIV infection. If you choose to take medicine to prevent HIV, you should first get tested for HIV. You should then be tested every 3 months for as long as you are taking the medicine. Pregnancy If you are about to stop having your period (premenopausal) and you may become pregnant, seek counseling before you get pregnant. Take 400 to 800 micrograms (mcg) of folic acid every day if you become pregnant. Ask for birth control (contraception) if you want to prevent pregnancy. Osteoporosis and menopause Osteoporosis is a disease in which the bones lose minerals and strength with aging. This can result in bone fractures. If you are 5 years old or older, or if you are at risk for osteoporosis and fractures, ask your health care provider if you should: Be screened for bone loss. Take a calcium or vitamin D supplement to lower your risk of fractures. Be given hormone replacement therapy (HRT) to treat symptoms of menopause. Follow these instructions at home: Lifestyle Do not use any products that contain nicotine or tobacco, such as cigarettes, e-cigarettes, and chewing tobacco. If you need help quitting, ask your health care provider. Do not use street drugs. Do not share needles. Ask your health care provider for help if you need support or information about quitting  drugs. Alcohol use Do not drink alcohol if: Your health care provider tells you not to drink. You are pregnant, may be pregnant, or are planning to become pregnant. If you drink alcohol: Limit how much you use to 0-1 drink a day. Limit intake if you are breastfeeding. Be aware of how much alcohol is in your drink. In the [place] one drink equals one 12 oz bottle of beer (355 mL), one 5 oz glass of wine (148 mL), or one 1 oz glass of hard liquor (44 mL). General instructions Schedule regular health, dental, and eye exams. Stay current with your vaccines. Tell your health care provider if: You often feel depressed. You have ever been abused or do not feel safe at home. Summary Adopting a healthy lifestyle and getting preventive care are important in promoting health and wellness. Follow your health care provider's instructions about healthy diet, exercising, and getting tested or screened for diseases. Follow your health care provider's instructions on monitoring your cholesterol and blood pressure. This information is not intended to replace advice given to you by your health care provider. Make  sure you discuss any questions you have with your health care provider. Document Revised: 05/09/2020 Document Reviewed: 02/22/2018 Elsevier Patient Education  2022 [employer].

## 2020-11-13 NOTE — Progress Notes (Signed)
Subjective:   Sheri Morgan is a 75 y.o. female who presents for Medicare Annual (Subsequent) preventive examination.  I connected with  Sheri Morgan on 11/12/2020 by a telemedicine enabled telemedicine application and verified that I am speaking with the correct person using two identifiers.   I discussed the limitations of evaluation and management by telemedicine. The patient expressed understanding and agreed to proceed.   Location of patient: Home Location of provider: Office Persons participating in visit: Sheri Morgan (patient) & Silvestre Moment, CMA  Review of Systems     Defer to PCP       Objective:    There were no vitals filed for this visit. There is no height or weight on file to calculate BMI.  Advanced Directives 11/13/2020  Does Patient Have a Medical Advance Directive? No  Would patient like information on creating a medical advance directive? Yes (ED - Information included in AVS)    Current Medications (verified) Outpatient Encounter Medications as of 11/12/2020  Medication Sig   hydrochlorothiazide (HYDRODIURIL) 25 MG tablet Take 1 tablet (25 mg total) by mouth daily.   levothyroxine (SYNTHROID) 200 MCG tablet Take 1 tablet (200 mcg total) by mouth daily before breakfast.   levothyroxine (SYNTHROID) 25 MCG tablet Take 25 mcg by mouth daily before breakfast.   apixaban (ELIQUIS) 5 MG TABS tablet Take 1 tablet (5 mg total) by mouth 2 (two) times daily. (Patient not taking: Reported on 11/12/2020)   gabapentin (NEURONTIN) 300 MG capsule Take 1 capsule (300 mg total) by mouth 3 (three) times daily as needed. (Patient not taking: Reported on 11/12/2020)   LORazepam (ATIVAN) 0.5 MG tablet Take 0.5 mg by mouth every 8 (eight) hours. (Patient not taking: Reported on 11/12/2020)   rosuvastatin (CRESTOR) 10 MG tablet Take 1 tablet (10 mg total) by mouth daily. (Patient not taking: Reported on 11/12/2020)   No facility-administered encounter medications on file as of 11/12/2020.     Allergies (verified) Codeine   History: Past Medical History:  Diagnosis Date   Anxiety    Atypical atrial flutter (HCC)    Fibromyalgia    Hypertension    Paroxysmal atrial fibrillation (HCC)    Premature atrial contractions    Premature ventricular contraction    Past Surgical History:  Procedure Laterality Date   ABDOMINAL HYSTERECTOMY     LUMBAR LAMINECTOMY     THYROIDECTOMY     Family History  Problem Relation Age of Onset   Diabetes Mother    Alcoholism Father    Social History   Socioeconomic History   Marital status: Divorced    Spouse name: Not on file   Number of children: Not on file   Years of education: Not on file   Highest education level: Not on file  Occupational History   Not on file  Tobacco Use   Smoking status: Never   Smokeless tobacco: Never  Substance and Sexual Activity   Alcohol use: No   Drug use: No   Sexual activity: Never  Other Topics Concern   Not on file  Social History Narrative   Retired from [employer]   Social Determinants of Corporate investment banker Strain: Low Risk    Difficulty of Paying Living Expenses: Not hard at all  Food Insecurity: No Food Insecurity   Worried About Programme researcher, broadcasting/film/video in the Last Year: Never true   Barista in the Last Year: Never true  Transportation Needs: No Transportation Needs  Lack of Transportation (Medical): No   Lack of Transportation (Non-Medical): No  Physical Activity: Insufficiently Active   Days of Exercise per Week: 1 day   Minutes of Exercise per Session: 30 min  Stress: No Stress Concern Present   Feeling of Stress : Not at all  Social Connections: Moderately Integrated   Frequency of Communication with Friends and Family: More than three times a week   Frequency of Social Gatherings with Friends and Family: Not on file   Attends Religious Services: More than 4 times per year   Active Member of [employer] or Organizations: Yes   Attends Banker  Meetings: Never   Marital Status: Divorced    Tobacco Counseling Counseling given: Not Answered   Clinical Intake:                 Diabetic?No         Activities of Daily Living In your present state of health, do you have any difficulty performing the following activities: 11/12/2020 11/12/2020  Hearing? N N  Vision? N -  Difficulty concentrating or making decisions? N -  Walking or climbing stairs? Y -  Comment - -  Dressing or bathing? N -  Doing errands, shopping? N -  Some recent data might be hidden    Patient Care Team: Etta Grandchild, MD as PCP - General (Internal Medicine) Parke Poisson, MD as PCP - Cardiology (Cardiology)  Indicate any recent Medical Services you may have received from other than [facility] providers in the past year (date may be approximate).     Assessment:   This is a routine wellness examination for Sheri Morgan.  Hearing/Vision screen No results found.  Dietary issues and exercise activities discussed: Current Exercise Habits: Home exercise routine, Type of exercise: walking, Time (Minutes): 30, Frequency (Times/Week): 2, Weekly Exercise (Minutes/Week): 60, Intensity: Mild   Goals Addressed   None    Depression Screen PHQ 2/9 Scores 11/12/2020 02/05/2020  PHQ - 2 Score 5 1  PHQ- 9 Score 13 -    Fall Risk Fall Risk  11/12/2020  Falls in the past year? 0  Number falls in past yr: 0  Injury with Fall? 0    FALL RISK PREVENTION PERTAINING TO THE HOME:  Any stairs in or around the home? No  If so, are there any without handrails? No  Home free of loose throw rugs in walkways, pet beds, electrical cords, etc? No  Adequate lighting in your home to reduce risk of falls? Yes   ASSISTIVE DEVICES UTILIZED TO PREVENT FALLS:  Life alert? No  Use of a cane, walker or w/c? No  Grab bars in the bathroom? No  Shower chair or bench in shower? No  Elevated toilet seat or a handicapped toilet? No   TIMED UP AND GO:  Was the test  performed? No .    Cognitive Function:     6CIT Screen 11/12/2020  What Year? 0 points  What month? 0 points  What time? 0 points  Count back from 20 0 points  Months in reverse 0 points  Repeat phrase 0 points  Total Score 0    Immunizations Immunization History  Administered Date(s) Administered   PFIZER(Purple Top)SARS-COV-2 Vaccination 08/31/2019, 09/21/2019    TDAP status: Due, Education has been provided regarding the importance of this vaccine. Advised may receive this vaccine at local pharmacy or Health Dept. Aware to provide a copy of the vaccination record if obtained from local pharmacy or  Health Dept. Verbalized acceptance and understanding.  Flu Vaccine status: Declined, Education has been provided regarding the importance of this vaccine but patient still declined. Advised may receive this vaccine at local pharmacy or Health Dept. Aware to provide a copy of the vaccination record if obtained from local pharmacy or Health Dept. Verbalized acceptance and understanding.  Pneumococcal vaccine status: Declined,  Education has been provided regarding the importance of this vaccine but patient still declined. Advised may receive this vaccine at local pharmacy or Health Dept. Aware to provide a copy of the vaccination record if obtained from local pharmacy or Health Dept. Verbalized acceptance and understanding.   Covid-19 vaccine status: Information provided on how to obtain vaccines.   Qualifies for Shingles Vaccine? Yes   Zostavax completed No   Shingrix Completed?: No.    Education has been provided regarding the importance of this vaccine. Patient has been advised to call insurance company to determine out of pocket expense if they have not yet received this vaccine. Advised may also receive vaccine at local pharmacy or Health Dept. Verbalized acceptance and understanding.  Screening Tests Health Maintenance  Topic Date Due   COVID-19 Vaccine (3 - Booster for Pfizer  series) 11/28/2020 (Originally 02/21/2020)   DEXA SCAN  12/15/2020 (Originally 02/26/2009)   Zoster Vaccines- Shingrix (1 of 2) 02/11/2021 (Originally 02/26/1994)   INFLUENZA VACCINE  06/12/2021 (Originally 10/13/2020)   TETANUS/TDAP  11/12/2021 (Originally 02/27/1965)   Hepatitis C Screening  11/12/2021 (Originally 02/26/1964)   PNA vac Low Risk Adult (1 of 2 - PCV13) 11/12/2021 (Originally 02/26/2009)   HPV VACCINES  Aged Out    Health Maintenance  There are no preventive care reminders to display for this patient.  Colorectal cancer screening: No longer required.   Mammogram: No longer required due to age  Bone Density has not been completed. Pt wants to discuss with female provider.  Lung Cancer Screening: (Low Dose CT Chest recommended if Age 39-80 years, 30 pack-year currently smoking OR have quit w/in 15years.) does not qualify.   Lung Cancer Screening Referral: No  Additional Screening:  Hepatitis C Screening: does qualify; not completed   Vision Screening: Recommended annual ophthalmology exams for early detection of glaucoma and other disorders of the eye. Is the patient up to date with their annual eye exam?  Yes  Who is the provider or what is the name of the office in which the patient attends annual eye exams? [facility]. If pt is not established with a provider, would they like to be referred to a provider to establish care? No .   Dental Screening: Recommended annual dental exams for proper oral hygiene  Community Resource Referral / Chronic Care Management: CRR required this visit?  No   CCM required this visit?  No      Plan:     I have personally reviewed and noted the following in the patient's chart:   Medical and social history Use of alcohol, tobacco or illicit drugs  Current medications and supplements including opioid prescriptions.  Functional ability and status Nutritional status Physical activity Advanced directives List of  other physicians Hospitalizations, surgeries, and ER visits in previous 12 months Vitals Screenings to include cognitive, depression, and falls Referrals and appointments  In addition, I have reviewed and discussed with patient certain preventive protocols, quality metrics, and best practice recommendations. A written personalized care plan for preventive services as well as general preventive health recommendations were provided to patient.  Thurmond Butts, CMA   11/13/2020   Nurse Notes: 35 minutes Non-face to face

## 2020-11-18 ENCOUNTER — Telehealth: Payer: Self-pay | Admitting: *Deleted

## 2020-11-18 NOTE — Telephone Encounter (Signed)
During our AWV call on 8/31, pt mentioned to me that she would like to change providers.  When she originally transferred to [facility] she requested a female provider. However, when she received her appt it was with a female provider.   Pt would like for someone to contact her about getting in soon to see a female provider (TOC).

## 2020-12-10 ENCOUNTER — Other Ambulatory Visit: Payer: Self-pay | Admitting: Internal Medicine

## 2020-12-10 DIAGNOSIS — E039 Hypothyroidism, unspecified: Secondary | ICD-10-CM

## 2020-12-13 ENCOUNTER — Other Ambulatory Visit: Payer: Self-pay | Admitting: Internal Medicine

## 2020-12-13 DIAGNOSIS — I10 Essential (primary) hypertension: Secondary | ICD-10-CM

## 2020-12-18 ENCOUNTER — Other Ambulatory Visit: Payer: Self-pay

## 2020-12-18 ENCOUNTER — Ambulatory Visit (HOSPITAL_COMMUNITY)
Admission: EM | Admit: 2020-12-18 | Discharge: 2020-12-18 | Disposition: A | Discharge status: Home / Self Care | Payer: Medicare Other | Attending: Emergency Medicine | Admitting: Emergency Medicine

## 2020-12-18 ENCOUNTER — Ambulatory Visit (INDEPENDENT_AMBULATORY_CARE_PROVIDER_SITE_OTHER): Payer: Medicare Other

## 2020-12-18 ENCOUNTER — Encounter (HOSPITAL_COMMUNITY): Payer: Self-pay | Admitting: Emergency Medicine

## 2020-12-18 DIAGNOSIS — I10 Essential (primary) hypertension: Secondary | ICD-10-CM

## 2020-12-18 DIAGNOSIS — J40 Bronchitis, not specified as acute or chronic: Secondary | ICD-10-CM

## 2020-12-18 DIAGNOSIS — R0602 Shortness of breath: Secondary | ICD-10-CM | POA: Diagnosis not present

## 2020-12-18 DIAGNOSIS — I517 Cardiomegaly: Secondary | ICD-10-CM | POA: Diagnosis not present

## 2020-12-18 DIAGNOSIS — E039 Hypothyroidism, unspecified: Secondary | ICD-10-CM | POA: Diagnosis not present

## 2020-12-18 DIAGNOSIS — R059 Cough, unspecified: Secondary | ICD-10-CM | POA: Diagnosis not present

## 2020-12-18 MED ORDER — PREDNISONE 10 MG (21) PO TBPK: ORAL_TABLET | Freq: Every day | ORAL | 0 refills | Status: DC

## 2020-12-18 MED ORDER — LEVOTHYROXINE SODIUM 200 MCG PO TABS: 200.0000 ug | ORAL_TABLET | Freq: Every day | ORAL | 0 refills | Status: DC

## 2020-12-18 MED ORDER — HYDROCHLOROTHIAZIDE 25 MG PO TABS: 25.0000 mg | ORAL_TABLET | Freq: Every day | ORAL | 1 refills | Status: DC

## 2020-12-18 NOTE — ED Provider Notes (Signed)
[facility]    CSN: [id number] Arrival date & time: 12/18/20  1544      History   Chief Complaint Chief Complaint  Patient presents with   Wheezing    HPI Sheri Morgan is a 75 y.o. adult.   Patient here for evaluation of cough, chest congestion, and wheezing that has been ongoing for the past several weeks but worsening over the past few days.  Reports cough and wheezing is worse at night.  Reports that it sounds like like her chest is "filling up with fluid" when she gets up in the middle of the night.  Reports symptoms do improve throughout the day.  Denies any trauma, injury, or other precipitating event.  Denies any specific alleviating or aggravating factors.  Denies any fevers, chest pain, shortness of breath, N/V/D, numbness, tingling, weakness, abdominal pain, or headaches.    The history is provided by the patient.  Wheezing Associated symptoms: shortness of breath    Past Medical History:  Diagnosis Date   Anxiety    Atypical atrial flutter (HCC)    Fibromyalgia    Hypertension    Paroxysmal atrial fibrillation (HCC)    Premature atrial contractions    Premature ventricular contraction     Patient Active Problem List   Diagnosis Date Noted   Neuroforaminal stenosis of cervical spine 04/04/2020   Stenosis of cervical spine with myelopathy (HCC) 04/04/2020   Stage 3a chronic kidney disease (HCC) 02/06/2020   Numbness in both hands 02/05/2020   Longstanding persistent atrial fibrillation (HCC) 02/05/2020   Acquired hypothyroidism 02/05/2020   Prediabetes 02/05/2020   Primary hypertension 02/05/2020   Hyperlipidemia LDL goal <130 02/05/2020    Past Surgical History:  Procedure Laterality Date   ABDOMINAL HYSTERECTOMY     LUMBAR LAMINECTOMY     THYROIDECTOMY      OB History   No obstetric history on file.      Home Medications    Prior to Admission medications   Medication Sig Start Date End Date Taking? Authorizing Provider   predniSONE (STERAPRED UNI-PAK 21 TAB) 10 MG (21) TBPK tablet Take by mouth daily. Take 6 tabs by mouth daily  for 2 days, then 5 tabs for 2 days, then 4 tabs for 2 days, then 3 tabs for 2 days, 2 tabs for 2 days, then 1 tab by mouth daily for 2 days 12/18/20  Yes Ivette Loyal, NP  apixaban (ELIQUIS) 5 MG TABS tablet Take 1 tablet (5 mg total) by mouth 2 (two) times daily. Patient not taking: Reported on 11/12/2020 05/09/20   Parke Poisson, MD  gabapentin (NEURONTIN) 300 MG capsule Take 1 capsule (300 mg total) by mouth 3 (three) times daily as needed. Patient not taking: Reported on 11/12/2020 11/12/19   Particia Nearing, PA-C  hydrochlorothiazide (HYDRODIURIL) 25 MG tablet Take 1 tablet (25 mg total) by mouth daily. 12/18/20   Ivette Loyal, NP  levothyroxine (SYNTHROID) 200 MCG tablet Take 1 tablet (200 mcg total) by mouth daily before breakfast. 12/18/20   Ivette Loyal, NP  levothyroxine (SYNTHROID) 25 MCG tablet Take 25 mcg by mouth daily before breakfast.    [provider]  LORazepam (ATIVAN) 0.5 MG tablet Take 0.5 mg by mouth every 8 (eight) hours. Patient not taking: Reported on 11/12/2020    [provider]  rosuvastatin (CRESTOR) 10 MG tablet Take 1 tablet (10 mg total) by mouth daily. Patient not taking: Reported on 11/12/2020 02/11/20   Yetta Barre,  Bernadene Bell, MD    Family History Family History  Problem Relation Age of Onset   Diabetes Mother    Alcoholism Father     Social History Social History   Tobacco Use   Smoking status: Never   Smokeless tobacco: Never  Vaping Use   Vaping Use: Never used  Substance Use Topics   Alcohol use: No   Drug use: No     Allergies   Codeine   Review of Systems Review of Systems  HENT:  Positive for congestion.   Respiratory:  Positive for shortness of breath and wheezing.   All other systems reviewed and are negative.   Physical Exam Triage Vital Signs ED Triage Vitals [12/18/20 1607]  Enc Vitals  Group     BP (!) 151/91     Pulse Rate 67     Resp      Temp 98.1 F (36.7 C)     Temp Source Oral     SpO2 95 %     Weight      Height      Head Circumference      Peak Flow      Pain Score 0     Pain Loc      Pain Edu?      Excl. in GC?    No data found.  Updated Vital Signs BP (!) 151/91 (BP Location: Right Arm)   Pulse 67   Temp 98.1 F (36.7 C) (Oral)   SpO2 95%   Visual Acuity Right Eye Distance:   Left Eye Distance:   Bilateral Distance:    Right Eye Near:   Left Eye Near:    Bilateral Near:     Physical Exam Vitals and nursing note reviewed.  Constitutional:      General: Sheri Morgan is not in acute distress.    Appearance: Normal appearance. Sheri Morgan is not ill-appearing, toxic-appearing or diaphoretic.  HENT:     Head: Normocephalic and atraumatic.     Nose: Congestion present. No rhinorrhea.     Mouth/Throat:     Pharynx: No oropharyngeal exudate or posterior oropharyngeal erythema.  Eyes:     Conjunctiva/sclera: Conjunctivae normal.  Cardiovascular:     Rate and Rhythm: Normal rate and regular rhythm.     Pulses: Normal pulses.     Heart sounds: Normal heart sounds.  Pulmonary:     Effort: Pulmonary effort is normal. No tachypnea, bradypnea or accessory muscle usage.     Breath sounds: Normal air entry. Examination of the right-lower field reveals decreased breath sounds. Examination of the left-lower field reveals decreased breath sounds. Decreased breath sounds present. No rhonchi or rales.  Abdominal:     General: Abdomen is flat.  Musculoskeletal:        General: Normal range of motion.     Cervical back: Normal range of motion and neck supple.  Skin:    General: Skin is warm and dry.  Neurological:     General: No focal deficit present.     Mental Status: Sheri Morgan is alert and oriented to person, place, and time.  Psychiatric:        Mood and Affect: Mood normal.     UC Treatments / Results  Labs (all labs ordered  are listed, but only abnormal results are displayed) Labs Reviewed - No data to display  EKG   Radiology DG Chest 2 View  Result Date: 12/18/2020 CLINICAL DATA:  Shortness of breath,  cough. EXAM: CHEST - 2 VIEW COMPARISON:  Jul 14, 2012. FINDINGS: Stable cardiomegaly. Both lungs are clear. The visualized skeletal structures are unremarkable. IMPRESSION: No active cardiopulmonary disease. Electronically Signed   By: Lupita Raider M.D.   On: 12/18/2020 16:55    Procedures Procedures (including critical care time)  Medications Ordered in UC Medications - No data to display  Initial Impression / Assessment and Plan / UC Course  I have reviewed the triage vital signs and the nursing notes.  Pertinent labs & imaging results that were available during my care of the patient were reviewed by me and considered in my medical decision making (see chart for details).    Assessment negative for red flags or concerns.  X-ray with no acute cardiopulmonary abnormality.  Likely viral bronchitis.  Will treat with prednisone Dosepak.  Patient reports that she has an initial appointment to get established with a primary care provider next week and has run out of hydrochlorothiazide and Synthroid.  Refill sent in and instructed that any additional refills will need to be sent by her PCP.  Encourage fluids and rest.  Tylenol as needed.  May try honey for cough.  Follow-up as needed Final Clinical Impressions(s) / UC Diagnoses   Final diagnoses:  Bronchitis  Primary hypertension  Acquired hypothyroidism     Discharge Instructions      Take the prednisone as prescribed.  Take the hydrochlorothiazide daily as prescribed. Take your Synthroid daily as prescribed.  Make sure you are drinking plenty of fluids and rest. You can take Tylenol as needed for pain.  For cough: honey 1/2 to 1 teaspoon (you can dilute the honey in water or another fluid).    Return or go to the Emergency Department if  symptoms worsen or do not improve in the next few days.      ED Prescriptions     Medication Sig Dispense Auth. Provider   hydrochlorothiazide (HYDRODIURIL) 25 MG tablet Take 1 tablet (25 mg total) by mouth daily. 90 tablet Ivette Loyal, NP   levothyroxine (SYNTHROID) 200 MCG tablet Take 1 tablet (200 mcg total) by mouth daily before breakfast. 90 tablet Ivette Loyal, NP   predniSONE (STERAPRED UNI-PAK 21 TAB) 10 MG (21) TBPK tablet Take by mouth daily. Take 6 tabs by mouth daily  for 2 days, then 5 tabs for 2 days, then 4 tabs for 2 days, then 3 tabs for 2 days, 2 tabs for 2 days, then 1 tab by mouth daily for 2 days 42 tablet Ivette Loyal, NP      PDMP not reviewed this encounter.   Ivette Loyal, NP 12/18/20 [phone]

## 2020-12-18 NOTE — Discharge Instructions (Signed)
Take the prednisone as prescribed.  Take the hydrochlorothiazide daily as prescribed. Take your Synthroid daily as prescribed.  Make sure you are drinking plenty of fluids and rest. You can take Tylenol as needed for pain.  For cough: honey 1/2 to 1 teaspoon (you can dilute the honey in water or another fluid).    Return or go to the Emergency Department if symptoms worsen or do not improve in the next few days.

## 2020-12-18 NOTE — ED Triage Notes (Signed)
Pt c/o of wheezing x 3 days. She had a cough and chest congestion 1 week ago. The cough has resolved. The wheezing is worse at night.

## 2020-12-26 DIAGNOSIS — I4811 Longstanding persistent atrial fibrillation: Secondary | ICD-10-CM | POA: Diagnosis not present

## 2020-12-26 DIAGNOSIS — I10 Essential (primary) hypertension: Secondary | ICD-10-CM | POA: Diagnosis not present

## 2020-12-26 DIAGNOSIS — F419 Anxiety disorder, unspecified: Secondary | ICD-10-CM | POA: Diagnosis not present

## 2020-12-26 DIAGNOSIS — G4700 Insomnia, unspecified: Secondary | ICD-10-CM | POA: Diagnosis not present

## 2020-12-26 DIAGNOSIS — E039 Hypothyroidism, unspecified: Secondary | ICD-10-CM | POA: Diagnosis not present

## 2020-12-26 DIAGNOSIS — E782 Mixed hyperlipidemia: Secondary | ICD-10-CM | POA: Diagnosis not present

## 2020-12-26 DIAGNOSIS — R7303 Prediabetes: Secondary | ICD-10-CM | POA: Diagnosis not present

## 2021-02-02 DIAGNOSIS — H35363 Drusen (degenerative) of macula, bilateral: Secondary | ICD-10-CM | POA: Diagnosis not present

## 2021-02-02 DIAGNOSIS — H04123 Dry eye syndrome of bilateral lacrimal glands: Secondary | ICD-10-CM | POA: Diagnosis not present

## 2021-02-02 DIAGNOSIS — H2513 Age-related nuclear cataract, bilateral: Secondary | ICD-10-CM | POA: Diagnosis not present

## 2021-02-02 DIAGNOSIS — H25013 Cortical age-related cataract, bilateral: Secondary | ICD-10-CM | POA: Diagnosis not present

## 2021-02-17 DIAGNOSIS — H2512 Age-related nuclear cataract, left eye: Secondary | ICD-10-CM | POA: Diagnosis not present

## 2021-02-17 DIAGNOSIS — H25812 Combined forms of age-related cataract, left eye: Secondary | ICD-10-CM | POA: Diagnosis not present

## 2021-04-23 ENCOUNTER — Encounter (HOSPITAL_COMMUNITY): Payer: Self-pay | Admitting: Emergency Medicine

## 2021-04-23 ENCOUNTER — Inpatient Hospital Stay (HOSPITAL_COMMUNITY)
Admission: EM | Disposition: A | Discharge status: Home / Self Care | Payer: Self-pay | Source: Home / Self Care | Attending: Internal Medicine

## 2021-04-23 ENCOUNTER — Emergency Department (HOSPITAL_COMMUNITY): Payer: Medicare Other

## 2021-04-23 ENCOUNTER — Inpatient Hospital Stay (HOSPITAL_COMMUNITY)
Admission: EM | Admit: 2021-04-23 | Discharge: 2021-05-11 | DRG: 222 | Disposition: A | Discharge status: Home / Self Care | Payer: Medicare Other | Attending: Internal Medicine | Admitting: Internal Medicine

## 2021-04-23 DIAGNOSIS — Z833 Family history of diabetes mellitus: Secondary | ICD-10-CM

## 2021-04-23 DIAGNOSIS — I5043 Acute on chronic combined systolic (congestive) and diastolic (congestive) heart failure: Secondary | ICD-10-CM | POA: Diagnosis not present

## 2021-04-23 DIAGNOSIS — K5730 Diverticulosis of large intestine without perforation or abscess without bleeding: Secondary | ICD-10-CM | POA: Diagnosis not present

## 2021-04-23 DIAGNOSIS — K5900 Constipation, unspecified: Secondary | ICD-10-CM | POA: Diagnosis present

## 2021-04-23 DIAGNOSIS — I4729 Other ventricular tachycardia: Secondary | ICD-10-CM | POA: Diagnosis not present

## 2021-04-23 DIAGNOSIS — N179 Acute kidney failure, unspecified: Secondary | ICD-10-CM

## 2021-04-23 DIAGNOSIS — I471 Supraventricular tachycardia: Secondary | ICD-10-CM | POA: Diagnosis not present

## 2021-04-23 DIAGNOSIS — R079 Chest pain, unspecified: Secondary | ICD-10-CM | POA: Diagnosis not present

## 2021-04-23 DIAGNOSIS — I5181 Takotsubo syndrome: Secondary | ICD-10-CM | POA: Diagnosis not present

## 2021-04-23 DIAGNOSIS — I480 Paroxysmal atrial fibrillation: Secondary | ICD-10-CM | POA: Diagnosis not present

## 2021-04-23 DIAGNOSIS — I472 Ventricular tachycardia, unspecified: Secondary | ICD-10-CM | POA: Diagnosis not present

## 2021-04-23 DIAGNOSIS — I10 Essential (primary) hypertension: Secondary | ICD-10-CM

## 2021-04-23 DIAGNOSIS — I252 Old myocardial infarction: Secondary | ICD-10-CM | POA: Diagnosis not present

## 2021-04-23 DIAGNOSIS — N1832 Chronic kidney disease, stage 3b: Secondary | ICD-10-CM | POA: Diagnosis present

## 2021-04-23 DIAGNOSIS — Z79899 Other long term (current) drug therapy: Secondary | ICD-10-CM

## 2021-04-23 DIAGNOSIS — I130 Hypertensive heart and chronic kidney disease with heart failure and stage 1 through stage 4 chronic kidney disease, or unspecified chronic kidney disease: Secondary | ICD-10-CM | POA: Diagnosis not present

## 2021-04-23 DIAGNOSIS — R778 Other specified abnormalities of plasma proteins: Secondary | ICD-10-CM

## 2021-04-23 DIAGNOSIS — N189 Chronic kidney disease, unspecified: Secondary | ICD-10-CM | POA: Diagnosis not present

## 2021-04-23 DIAGNOSIS — E890 Postprocedural hypothyroidism: Secondary | ICD-10-CM | POA: Diagnosis not present

## 2021-04-23 DIAGNOSIS — D6959 Other secondary thrombocytopenia: Secondary | ICD-10-CM | POA: Diagnosis present

## 2021-04-23 DIAGNOSIS — R600 Localized edema: Secondary | ICD-10-CM | POA: Diagnosis not present

## 2021-04-23 DIAGNOSIS — I959 Hypotension, unspecified: Secondary | ICD-10-CM | POA: Diagnosis not present

## 2021-04-23 DIAGNOSIS — N1831 Chronic kidney disease, stage 3a: Secondary | ICD-10-CM | POA: Diagnosis not present

## 2021-04-23 DIAGNOSIS — I509 Heart failure, unspecified: Secondary | ICD-10-CM | POA: Diagnosis not present

## 2021-04-23 DIAGNOSIS — I214 Non-ST elevation (NSTEMI) myocardial infarction: Secondary | ICD-10-CM | POA: Diagnosis not present

## 2021-04-23 DIAGNOSIS — E873 Alkalosis: Secondary | ICD-10-CM | POA: Diagnosis not present

## 2021-04-23 DIAGNOSIS — D8689 Sarcoidosis of other sites: Secondary | ICD-10-CM | POA: Diagnosis present

## 2021-04-23 DIAGNOSIS — R911 Solitary pulmonary nodule: Secondary | ICD-10-CM | POA: Insufficient documentation

## 2021-04-23 DIAGNOSIS — Z79890 Hormone replacement therapy (postmenopausal): Secondary | ICD-10-CM | POA: Diagnosis not present

## 2021-04-23 DIAGNOSIS — D8685 Sarcoid myocarditis: Secondary | ICD-10-CM

## 2021-04-23 DIAGNOSIS — Z20822 Contact with and (suspected) exposure to covid-19: Secondary | ICD-10-CM | POA: Diagnosis present

## 2021-04-23 DIAGNOSIS — I4892 Unspecified atrial flutter: Secondary | ICD-10-CM | POA: Diagnosis not present

## 2021-04-23 DIAGNOSIS — I5023 Acute on chronic systolic (congestive) heart failure: Secondary | ICD-10-CM | POA: Diagnosis not present

## 2021-04-23 DIAGNOSIS — I440 Atrioventricular block, first degree: Secondary | ICD-10-CM | POA: Diagnosis present

## 2021-04-23 DIAGNOSIS — E875 Hyperkalemia: Secondary | ICD-10-CM | POA: Diagnosis not present

## 2021-04-23 DIAGNOSIS — I2510 Atherosclerotic heart disease of native coronary artery without angina pectoris: Secondary | ICD-10-CM | POA: Diagnosis present

## 2021-04-23 DIAGNOSIS — I428 Other cardiomyopathies: Secondary | ICD-10-CM | POA: Diagnosis not present

## 2021-04-23 DIAGNOSIS — I4891 Unspecified atrial fibrillation: Secondary | ICD-10-CM | POA: Diagnosis not present

## 2021-04-23 DIAGNOSIS — Z95810 Presence of automatic (implantable) cardiac defibrillator: Secondary | ICD-10-CM | POA: Diagnosis not present

## 2021-04-23 DIAGNOSIS — Z90710 Acquired absence of both cervix and uterus: Secondary | ICD-10-CM

## 2021-04-23 DIAGNOSIS — Z811 Family history of alcohol abuse and dependence: Secondary | ICD-10-CM

## 2021-04-23 DIAGNOSIS — I083 Combined rheumatic disorders of mitral, aortic and tricuspid valves: Secondary | ICD-10-CM | POA: Diagnosis not present

## 2021-04-23 DIAGNOSIS — E876 Hypokalemia: Secondary | ICD-10-CM | POA: Diagnosis present

## 2021-04-23 DIAGNOSIS — M797 Fibromyalgia: Secondary | ICD-10-CM | POA: Diagnosis present

## 2021-04-23 DIAGNOSIS — Z452 Encounter for adjustment and management of vascular access device: Secondary | ICD-10-CM

## 2021-04-23 DIAGNOSIS — I495 Sick sinus syndrome: Secondary | ICD-10-CM | POA: Diagnosis present

## 2021-04-23 DIAGNOSIS — I5042 Chronic combined systolic (congestive) and diastolic (congestive) heart failure: Secondary | ICD-10-CM

## 2021-04-23 DIAGNOSIS — E782 Mixed hyperlipidemia: Secondary | ICD-10-CM | POA: Diagnosis not present

## 2021-04-23 DIAGNOSIS — I340 Nonrheumatic mitral (valve) insufficiency: Secondary | ICD-10-CM | POA: Diagnosis not present

## 2021-04-23 DIAGNOSIS — I5022 Chronic systolic (congestive) heart failure: Secondary | ICD-10-CM | POA: Diagnosis not present

## 2021-04-23 DIAGNOSIS — E669 Obesity, unspecified: Secondary | ICD-10-CM | POA: Diagnosis not present

## 2021-04-23 DIAGNOSIS — I088 Other rheumatic multiple valve diseases: Secondary | ICD-10-CM | POA: Diagnosis not present

## 2021-04-23 DIAGNOSIS — R570 Cardiogenic shock: Secondary | ICD-10-CM | POA: Diagnosis not present

## 2021-04-23 DIAGNOSIS — I517 Cardiomegaly: Secondary | ICD-10-CM | POA: Diagnosis not present

## 2021-04-23 DIAGNOSIS — I5020 Unspecified systolic (congestive) heart failure: Secondary | ICD-10-CM | POA: Diagnosis not present

## 2021-04-23 DIAGNOSIS — R0602 Shortness of breath: Secondary | ICD-10-CM | POA: Diagnosis present

## 2021-04-23 HISTORY — DX: Ventricular tachycardia, unspecified: I47.20

## 2021-04-23 HISTORY — DX: Chronic combined systolic (congestive) and diastolic (congestive) heart failure: I50.42

## 2021-04-23 HISTORY — PX: LEFT HEART CATH AND CORONARY ANGIOGRAPHY: CATH118249

## 2021-04-23 HISTORY — DX: Other cardiomyopathies: I42.8

## 2021-04-23 HISTORY — DX: Presence of automatic (implantable) cardiac defibrillator: Z95.810

## 2021-04-23 HISTORY — DX: Sarcoid myocarditis: D86.85

## 2021-04-23 HISTORY — DX: Sick sinus syndrome: I49.5

## 2021-04-23 LAB — HEMOGLOBIN A1C
Hgb A1c MFr Bld: 6 % — ABNORMAL HIGH (ref 4.8–5.6)
Mean Plasma Glucose: 125.5 mg/dL

## 2021-04-23 LAB — LIPID PANEL
Cholesterol: 253 mg/dL — ABNORMAL HIGH (ref 0–200)
HDL: 101 mg/dL (ref >40)
LDL Cholesterol: 141 mg/dL — ABNORMAL HIGH (ref 0–99)
Total CHOL/HDL Ratio: 2.5 RATIO
Triglycerides: 53 mg/dL (ref <150)
VLDL: 11 mg/dL (ref 0–40)

## 2021-04-23 LAB — RESP PANEL BY RT-PCR (FLU A&B, COVID) ARPGX2
Influenza A by PCR: NEGATIVE
Influenza B by PCR: NEGATIVE
SARS Coronavirus 2 by RT PCR: NEGATIVE

## 2021-04-23 LAB — BASIC METABOLIC PANEL
Anion gap: 11 (ref 5–15)
BUN: 15 mg/dL (ref 8–23)
CO2: 25 mmol/L (ref 22–32)
Calcium: 9 mg/dL (ref 8.9–10.3)
Chloride: 103 mmol/L (ref 98–111)
Creatinine, Ser: 1.41 mg/dL — ABNORMAL HIGH (ref 0.44–1.00)
GFR, Estimated: 38 mL/min — ABNORMAL LOW (ref >60)
Glucose, Bld: 134 mg/dL — ABNORMAL HIGH (ref 70–99)
Potassium: 3.4 mmol/L — ABNORMAL LOW (ref 3.5–5.1)
Sodium: 139 mmol/L (ref 135–145)

## 2021-04-23 LAB — I-STAT CHEM 8, ED
BUN: 16 mg/dL (ref 8–23)
Calcium, Ion: 1.11 mmol/L — ABNORMAL LOW (ref 1.15–1.40)
Chloride: 103 mmol/L (ref 98–111)
Creatinine, Ser: 1.3 mg/dL — ABNORMAL HIGH (ref 0.44–1.00)
Glucose, Bld: 138 mg/dL — ABNORMAL HIGH (ref 70–99)
HCT: 44 % (ref 36.0–46.0)
Hemoglobin: 15 g/dL (ref 12.0–15.0)
Potassium: 3.5 mmol/L (ref 3.5–5.1)
Sodium: 140 mmol/L (ref 135–145)
TCO2: 26 mmol/L (ref 22–32)

## 2021-04-23 LAB — CBC
HCT: 39.7 % (ref 36.0–46.0)
Hemoglobin: 12.7 g/dL (ref 12.0–15.0)
MCH: 29.6 pg (ref 26.0–34.0)
MCHC: 32 g/dL (ref 30.0–36.0)
MCV: 92.5 fL (ref 80.0–100.0)
Platelets: 180 10*3/uL (ref 150–400)
RBC: 4.29 MIL/uL (ref 3.87–5.11)
RDW: 16.4 % — ABNORMAL HIGH (ref 11.5–15.5)
WBC: 7 10*3/uL (ref 4.0–10.5)
nRBC: 0 % (ref 0.0–0.2)

## 2021-04-23 LAB — HEPATIC FUNCTION PANEL
ALT: 24 U/L (ref 0–44)
AST: 58 U/L — ABNORMAL HIGH (ref 15–41)
Albumin: 3.7 g/dL (ref 3.5–5.0)
Alkaline Phosphatase: 90 U/L (ref 38–126)
Bilirubin, Direct: 0.2 mg/dL (ref 0.0–0.2)
Indirect Bilirubin: 0.4 mg/dL (ref 0.3–0.9)
Total Bilirubin: 0.6 mg/dL (ref 0.3–1.2)
Total Protein: 6.8 g/dL (ref 6.5–8.1)

## 2021-04-23 LAB — TYPE AND SCREEN
ABO/RH(D): B POS
Antibody Screen: NEGATIVE

## 2021-04-23 LAB — APTT: aPTT: 24 s (ref 24–36)

## 2021-04-23 LAB — ABO/RH: ABO/RH(D): B POS

## 2021-04-23 LAB — TROPONIN I (HIGH SENSITIVITY)
Troponin I (High Sensitivity): 204 ng/L (ref <18)
Troponin I (High Sensitivity): 379 ng/L (ref <18)

## 2021-04-23 LAB — MAGNESIUM: Magnesium: 2.1 mg/dL (ref 1.7–2.4)

## 2021-04-23 LAB — PROTIME-INR
INR: 1 (ref 0.8–1.2)
Prothrombin Time: 12.7 seconds (ref 11.4–15.2)

## 2021-04-23 LAB — HEPARIN LEVEL (UNFRACTIONATED): Heparin Unfractionated: <0.1 IU/mL — ABNORMAL LOW (ref 0.30–0.70)

## 2021-04-23 LAB — BRAIN NATRIURETIC PEPTIDE: B Natriuretic Peptide: 379.1 pg/mL — ABNORMAL HIGH (ref 0.0–100.0)

## 2021-04-23 LAB — TSH: TSH: 3.737 u[IU]/mL (ref 0.350–4.500)

## 2021-04-23 SURGERY — LEFT HEART CATH AND CORONARY ANGIOGRAPHY
Anesthesia: LOCAL

## 2021-04-23 MED ORDER — NITROGLYCERIN 0.4 MG SL SUBL
SUBLINGUAL_TABLET | SUBLINGUAL | Status: AC
  Administered 2021-04-23: 0.4 mg via SUBLINGUAL
  Filled 2021-04-23: qty 3

## 2021-04-23 MED ORDER — HEPARIN (PORCINE) IN NACL 1000-0.9 UT/500ML-% IV SOLN
INTRAVENOUS | Status: AC
  Filled 2021-04-23: qty 1000

## 2021-04-23 MED ORDER — NITROGLYCERIN IN D5W 200-5 MCG/ML-% IV SOLN: 0.0000 ug/min | INTRAVENOUS | Status: DC

## 2021-04-23 MED ORDER — ROSUVASTATIN CALCIUM 20 MG PO TABS
40.0000 mg | ORAL_TABLET | Freq: Every day | ORAL | Status: DC
  Administered 2021-04-24 – 2021-05-11 (×19): 40 mg via ORAL
  Filled 2021-04-23 (×20): qty 2

## 2021-04-23 MED ORDER — HEPARIN SODIUM (PORCINE) 1000 UNIT/ML IJ SOLN
INTRAMUSCULAR | Status: DC | PRN
  Administered 2021-04-23: 10000 [IU] via INTRAVENOUS

## 2021-04-23 MED ORDER — HEPARIN (PORCINE) 25000 UT/250ML-% IV SOLN
900.0000 [IU]/h | INTRAVENOUS | Status: DC
  Administered 2021-04-23: 900 [IU]/h via INTRAVENOUS
  Filled 2021-04-23: qty 250

## 2021-04-23 MED ORDER — ASPIRIN 325 MG PO TABS
ORAL_TABLET | ORAL | Status: DC | PRN
  Administered 2021-04-23: 325 mg via ORAL

## 2021-04-23 MED ORDER — FUROSEMIDE 10 MG/ML IJ SOLN
INTRAMUSCULAR | Status: DC | PRN
  Administered 2021-04-23: 40 mg via INTRAVENOUS

## 2021-04-23 MED ORDER — SENNOSIDES-DOCUSATE SODIUM 8.6-50 MG PO TABS
2.0000 | ORAL_TABLET | Freq: Two times a day (BID) | ORAL | Status: DC
  Administered 2021-04-24 – 2021-04-29 (×10): 2 via ORAL
  Filled 2021-04-23 (×10): qty 2

## 2021-04-23 MED ORDER — LEVOTHYROXINE SODIUM 100 MCG PO TABS
200.0000 ug | ORAL_TABLET | Freq: Every day | ORAL | Status: DC
  Administered 2021-04-24 – 2021-05-11 (×18): 200 ug via ORAL
  Filled 2021-04-23 (×19): qty 2

## 2021-04-23 MED ORDER — IOHEXOL 350 MG/ML SOLN
INTRAVENOUS | Status: DC | PRN
  Administered 2021-04-23: 70 mL

## 2021-04-23 MED ORDER — LIDOCAINE HCL (PF) 1 % IJ SOLN
INTRAMUSCULAR | Status: AC
  Filled 2021-04-23: qty 30

## 2021-04-23 MED ORDER — NITROGLYCERIN 1 MG/10 ML FOR IR/CATH LAB
INTRA_ARTERIAL | Status: AC
  Filled 2021-04-23: qty 10

## 2021-04-23 MED ORDER — AMIODARONE LOAD VIA INFUSION
INTRAVENOUS | Status: DC | PRN
  Administered 2021-04-23: 150 mg via INTRAVENOUS

## 2021-04-23 MED ORDER — FENTANYL CITRATE (PF) 100 MCG/2ML IJ SOLN
INTRAMUSCULAR | Status: DC | PRN
  Administered 2021-04-23 (×2): 25 ug via INTRAVENOUS

## 2021-04-23 MED ORDER — POTASSIUM CHLORIDE 20 MEQ PO PACK
40.0000 meq | PACK | Freq: Once | ORAL | Status: DC
Start: 2021-04-23 — End: 2021-04-24
  Filled 2021-04-23: qty 2

## 2021-04-23 MED ORDER — AMIODARONE HCL IN DEXTROSE 360-4.14 MG/200ML-% IV SOLN
INTRAVENOUS | Status: AC
  Filled 2021-04-23: qty 200

## 2021-04-23 MED ORDER — VERAPAMIL HCL 2.5 MG/ML IV SOLN
INTRAVENOUS | Status: AC
  Filled 2021-04-23: qty 2

## 2021-04-23 MED ORDER — MIDAZOLAM HCL 2 MG/2ML IJ SOLN
INTRAMUSCULAR | Status: AC
  Filled 2021-04-23: qty 2

## 2021-04-23 MED ORDER — IOHEXOL 350 MG/ML SOLN
80.0000 mL | Freq: Once | INTRAVENOUS | Status: AC | PRN
  Administered 2021-04-23: 80 mL via INTRAVENOUS

## 2021-04-23 MED ORDER — ASPIRIN 325 MG PO TABS
ORAL_TABLET | ORAL | Status: AC
  Filled 2021-04-23: qty 1

## 2021-04-23 MED ORDER — FENTANYL CITRATE (PF) 100 MCG/2ML IJ SOLN
INTRAMUSCULAR | Status: AC
  Filled 2021-04-23: qty 2

## 2021-04-23 MED ORDER — NITROGLYCERIN 0.4 MG SL SUBL
0.4000 mg | SUBLINGUAL_TABLET | SUBLINGUAL | Status: DC | PRN
  Administered 2021-04-23 – 2021-04-29 (×5): 0.4 mg via SUBLINGUAL
  Filled 2021-04-23 (×2): qty 1

## 2021-04-23 MED ORDER — ACETAMINOPHEN 325 MG PO TABS: 650.0000 mg | ORAL_TABLET | ORAL | Status: DC | PRN

## 2021-04-23 MED ORDER — HEPARIN (PORCINE) IN NACL 1000-0.9 UT/500ML-% IV SOLN
INTRAVENOUS | Status: DC | PRN
  Administered 2021-04-23 (×2): 500 mL

## 2021-04-23 MED ORDER — AMIODARONE HCL IN DEXTROSE 360-4.14 MG/200ML-% IV SOLN
INTRAVENOUS | Status: AC | PRN
  Administered 2021-04-23: 60 mg/h via INTRAVENOUS

## 2021-04-23 MED ORDER — ASPIRIN 81 MG PO CHEW
324.0000 mg | CHEWABLE_TABLET | Freq: Once | ORAL | Status: AC
  Filled 2021-04-23: qty 4

## 2021-04-23 MED ORDER — FUROSEMIDE 10 MG/ML IJ SOLN
40.0000 mg | Freq: Two times a day (BID) | INTRAMUSCULAR | Status: DC
  Administered 2021-04-24 – 2021-04-26 (×4): 40 mg via INTRAVENOUS
  Filled 2021-04-23 (×4): qty 4

## 2021-04-23 MED ORDER — NITROGLYCERIN 0.4 MG SL SUBL: 0.4000 mg | SUBLINGUAL_TABLET | SUBLINGUAL | Status: DC | PRN

## 2021-04-23 MED ORDER — ONDANSETRON 4 MG PO TBDP: 4.0000 mg | ORAL_TABLET | Freq: Once | ORAL | Status: DC

## 2021-04-23 MED ORDER — HEPARIN SODIUM (PORCINE) 1000 UNIT/ML IJ SOLN
INTRAMUSCULAR | Status: AC
  Filled 2021-04-23: qty 10

## 2021-04-23 MED ORDER — METOPROLOL TARTRATE 25 MG PO TABS: 25.0000 mg | ORAL_TABLET | Freq: Two times a day (BID) | ORAL | Status: DC

## 2021-04-23 MED ORDER — ONDANSETRON HCL 4 MG/2ML IJ SOLN
4.0000 mg | Freq: Once | INTRAMUSCULAR | Status: AC
  Administered 2021-04-23: 4 mg via INTRAVENOUS
  Filled 2021-04-23: qty 2

## 2021-04-23 MED ORDER — AMIODARONE HCL 150 MG/3ML IV SOLN
INTRAVENOUS | Status: AC
  Filled 2021-04-23: qty 3

## 2021-04-23 MED ORDER — ASPIRIN EC 81 MG PO TBEC
81.0000 mg | DELAYED_RELEASE_TABLET | Freq: Every day | ORAL | Status: DC
  Administered 2021-04-24 – 2021-05-01 (×8): 81 mg via ORAL
  Filled 2021-04-23 (×8): qty 1

## 2021-04-23 MED ORDER — MIDAZOLAM HCL 2 MG/2ML IJ SOLN
INTRAMUSCULAR | Status: DC | PRN
  Administered 2021-04-23: 1 mg via INTRAVENOUS

## 2021-04-23 MED ORDER — FUROSEMIDE 10 MG/ML IJ SOLN
INTRAMUSCULAR | Status: AC
  Filled 2021-04-23: qty 4

## 2021-04-23 SURGICAL SUPPLY — 13 items
CATH INFINITI 5FR ANG PIGTAIL (CATHETERS) ×1 IMPLANT
CATH INFINITI JR4 5F (CATHETERS) ×1 IMPLANT
CATH VISTA GUIDE 6FR XBLAD3.5 (CATHETERS) ×1 IMPLANT
DEVICE RAD COMP TR BAND LRG (VASCULAR PRODUCTS) ×1 IMPLANT
GLIDESHEATH SLEND SS 6F .021 (SHEATH) ×1 IMPLANT
GUIDEWIRE INQWIRE 1.5J.035X260 (WIRE) IMPLANT
INQWIRE 1.5J .035X260CM (WIRE) ×2
KIT ENCORE 26 ADVANTAGE (KITS) ×1 IMPLANT
KIT HEART LEFT (KITS) ×2 IMPLANT
PACK CARDIAC CATHETERIZATION (CUSTOM PROCEDURE TRAY) ×2 IMPLANT
SYR MEDRAD MARK 7 150ML (SYRINGE) ×2 IMPLANT
TRANSDUCER W/STOPCOCK (MISCELLANEOUS) ×2 IMPLANT
TUBING CIL FLEX 10 FLL-RA (TUBING) ×2 IMPLANT

## 2021-04-23 NOTE — Progress Notes (Signed)
ANTICOAGULATION CONSULT NOTE - Initial Consult  Pharmacy Consult for IV heparin Indication: chest pain/ACS  Allergies  Allergen Reactions   Codeine Itching    Patient Measurements: Height: 5' 3.5" (161.3 cm) Weight: 95.3 kg (210 lb 1.6 oz) IBW/kg (Calculated) : 53.56 Heparin Dosing Weight: 75.5 kg  Vital Signs: Temp: 98.1 F (36.7 C) (02/09 1843) Temp Source: Oral (02/09 1843) BP: 155/103 (02/09 1843) Pulse Rate: 104 (02/09 1843)  Labs: Recent Labs    04/23/21 1847 04/23/21 1909  HGB 12.7 15.0  HCT 39.7 44.0  PLT 180  --   CREATININE 1.41* 1.30*  TROPONINIHS 204*  --     Estimated Creatinine Clearance (by C-G formula based on SCr of 1.3 mg/dL (H)) Female: 51.8 mL/min (A) Female: 49.1 mL/min (A)   Medical History: Past Medical History:  Diagnosis Date   Anxiety    Atypical atrial flutter (HCC)    Fibromyalgia    Hypertension    Paroxysmal atrial fibrillation (HCC)    Premature atrial contractions    Premature ventricular contraction      Assessment: 75YO female admitted for chest pain. Troponin is elevated at 204. Patient reports that she was previously on Eliquis for h/o DVT but is no longer taking (self discontinued 6-8 weeks ago) as she did not believe when her doctor told her that she had a DVT. However, per my chart review, patient was placed on Eliquis in February 2022 for atrial fibrillation.   Goal of Therapy:  Heparin level 0.3-0.7 units/ml Monitor platelets by anticoagulation protocol: Yes   Plan:  Will obtain baseline aPTT in the event patient has been taking Eliquis Start heparin gtt at 900 units/hr Confirmatory heparin level in ~6h Monitor CBC, heparin level, and s/sx of bleeding daily   Valeda Malm, Pharm.D. PGY-1 Pharmacy Resident [phone] 04/23/2021 9:13 PM

## 2021-04-23 NOTE — ED Triage Notes (Signed)
Pt here from home with c/o chest pain and pain in her back between the shoulder blades and vomited upon arriving to the ED

## 2021-04-23 NOTE — ED Provider Triage Note (Addendum)
Emergency Medicine Provider Triage Evaluation Note  Sheri Morgan , a 76 y.o. adult  was evaluated in triage.  Pt complains of chest pain onset around 6:00 tonight.  Patient had eaten dinner and was moving a lot of laundry when she had sudden onset of chest heaviness in the center of her chest which radiates through to her shoulder blades.  Associated with nausea, vomiting upon arrival in the emergency room, diaphoresis.  Denies abdominal pain or shortness of breath. On Eliquis for afib Review of Systems  Positive: Chest pain, back pain, nausea vomiting Negative: Abdominal pain  Physical Exam  There were no vitals taken for this visit. Gen:   Awake, no distress   Resp:  Normal effort  MSK:   Moves extremities without difficulty  Other:  Appears uncomfortable   Medical Decision Making  Medically screening exam initiated at 6:39 PM.  Appropriate orders placed.  ESSENSE BOUSQUET was informed that the remainder of the evaluation will be completed by another provider, this initial triage assessment does not replace that evaluation, and the importance of remaining in the ED until their evaluation is complete.  Discussed with Dr. Stevie Kern, ER attending who has seen the patient. Dissection study ordered as well as troponin/cardiac work up. Charge aware of need for room.    Jeannie Fend, PA-C 04/23/21 1854  Consult with Dr. Clifton James with cardiology who has reviewed patient's EKG, not STEMI at this time, agrees with workup as planned.    Jeannie Fend, PA-C 04/23/21 1902   Initial trop 204, call back to Dr. Clifton James, recommends admit to cardiology- call to consult. Heparin, ASA, nitro ordered but pending as patient is in triage. Charge nurse aware of need for room.  Call to Cardiology Dr. Lendell Caprice who will come and see pt.    Jeannie Fend, PA-C 04/23/21 2014

## 2021-04-23 NOTE — ED Notes (Signed)
Cath lab ready to to receive pt - pt taken to cath lab with primary RN

## 2021-04-23 NOTE — ED Triage Notes (Signed)
Charge notified to expedite room assignment ASAP due to elevated Troponin  result.

## 2021-04-23 NOTE — ED Provider Notes (Signed)
[facility] Provider Note   CSN: [id number] Arrival date & time: 04/23/21  1836     History  No chief complaint on file.   Sheri Morgan is a 76 y.o. adult.  Presented to the emergency room with concern for chest pain.  Patient states that pain started around 6:00 this evening.  Center of chest radiating to back and between her shoulder blades.  Up to 8 or 9 out of 10 in severity.  Heaviness.  Had associated nausea and had an episode of vomiting and feeling somewhat sweaty.  Pain is persistent.  Denies any numbness or focal weakness but states that she feels generally weak.  No fevers or chills, no cough or congestion.  She denies any past history of heart blockages.      HPI     Home Medications Prior to Admission medications   Medication Sig Start Date End Date Taking? Authorizing Provider  hydrochlorothiazide (HYDRODIURIL) 25 MG tablet Take 1 tablet (25 mg total) by mouth daily. 12/18/20  Yes Pearson Forster, NP  levothyroxine (SYNTHROID) 200 MCG tablet Take 1 tablet (200 mcg total) by mouth daily before breakfast. 12/18/20  Yes Pearson Forster, NP  prednisoLONE acetate (PRED FORTE) 1 % ophthalmic suspension Place 1 drop into the left eye every evening. 03/18/21  Yes [provider]  apixaban (ELIQUIS) 5 MG TABS tablet Take 1 tablet (5 mg total) by mouth 2 (two) times daily. Patient not taking: Reported on 11/12/2020 05/09/20   Elouise Munroe, MD  gabapentin (NEURONTIN) 300 MG capsule Take 1 capsule (300 mg total) by mouth 3 (three) times daily as needed. Patient not taking: Reported on 11/12/2020 11/12/19   Volney American, PA-C  levothyroxine (SYNTHROID) 25 MCG tablet Take 25 mcg by mouth daily before breakfast.    [provider]  LORazepam (ATIVAN) 0.5 MG tablet Take 0.5 mg by mouth every 8 (eight) hours. Patient not taking: Reported on 11/12/2020    [provider]  predniSONE (STERAPRED UNI-PAK 21 TAB) 10  MG (21) TBPK tablet Take by mouth daily. Take 6 tabs by mouth daily  for 2 days, then 5 tabs for 2 days, then 4 tabs for 2 days, then 3 tabs for 2 days, 2 tabs for 2 days, then 1 tab by mouth daily for 2 days Patient not taking: Reported on 04/23/2021 12/18/20   Pearson Forster, NP  rosuvastatin (CRESTOR) 10 MG tablet Take 1 tablet (10 mg total) by mouth daily. Patient not taking: Reported on 11/12/2020 02/11/20   Janith Lima, MD      Allergies    Codeine    Review of Systems   Review of Systems  Constitutional:  Negative for chills and fever.  HENT:  Negative for ear pain and sore throat.   Eyes:  Negative for pain and visual disturbance.  Respiratory:  Positive for chest tightness. Negative for cough and shortness of breath.   Cardiovascular:  Positive for chest pain. Negative for palpitations.  Gastrointestinal:  Negative for abdominal pain and vomiting.  Genitourinary:  Negative for dysuria and hematuria.  Musculoskeletal:  Negative for arthralgias and back pain.  Skin:  Negative for color change and rash.  Neurological:  Negative for seizures and syncope.  All other systems reviewed and are negative.  Physical Exam Updated Vital Signs BP (!) 150/128    Pulse 99    Temp 98.1 F (36.7 C) (Oral)    Resp 19  Ht 5' 3.5" (1.613 m)    Wt 95.3 kg    SpO2 100%    BMI 36.63 kg/m  Physical Exam Vitals and nursing note reviewed.  Constitutional:      General: Sheri Morgan is not in acute distress.    Appearance: Sheri Morgan is well-developed.     Comments: Appears somewhat uncomfortable but alert, no distress  HENT:     Head: Normocephalic and atraumatic.  Eyes:     Conjunctiva/sclera: Conjunctivae normal.  Cardiovascular:     Rate and Rhythm: Normal rate and regular rhythm.     Heart sounds: No murmur heard. Pulmonary:     Effort: Pulmonary effort is normal. No respiratory distress.     Breath sounds: Normal breath sounds.  Abdominal:     Palpations: Abdomen is soft.      Tenderness: There is no abdominal tenderness.  Musculoskeletal:        General: No swelling.     Cervical back: Neck supple.  Skin:    General: Skin is warm and dry.     Capillary Refill: Capillary refill takes less than 2 seconds.  Neurological:     Mental Status: Sheri Morgan is alert.  Psychiatric:        Mood and Affect: Mood normal.    ED Results / Procedures / Treatments   Labs (all labs ordered are listed, but only abnormal results are displayed) Labs Reviewed  BASIC METABOLIC PANEL - Abnormal; Notable for the following components:      Result Value   Potassium 3.4 (*)    Glucose, Bld 134 (*)    Creatinine, Ser 1.41 (*)    GFR, Estimated 38 (*)    All other components within normal limits  CBC - Abnormal; Notable for the following components:   RDW 16.4 (*)    All other components within normal limits  HEPATIC FUNCTION PANEL - Abnormal; Notable for the following components:   AST 58 (*)    All other components within normal limits  HEMOGLOBIN A1C - Abnormal; Notable for the following components:   Hgb A1c MFr Bld 6.0 (*)    All other components within normal limits  HEPARIN LEVEL (UNFRACTIONATED) - Abnormal; Notable for the following components:   Heparin Unfractionated <0.10 (*)    All other components within normal limits  I-STAT CHEM 8, ED - Abnormal; Notable for the following components:   Creatinine, Ser 1.30 (*)    Glucose, Bld 138 (*)    Calcium, Ion 1.11 (*)    All other components within normal limits  TROPONIN I (HIGH SENSITIVITY) - Abnormal; Notable for the following components:   Troponin I (High Sensitivity) 204 (*)    All other components within normal limits  RESP PANEL BY RT-PCR (FLU A&B, COVID) ARPGX2  MAGNESIUM  PROTIME-INR  APTT  BRAIN NATRIURETIC PEPTIDE  TSH  LIPID PANEL  CBC  HEPARIN LEVEL (UNFRACTIONATED)  APTT  TYPE AND SCREEN  ABO/RH  TROPONIN I (HIGH SENSITIVITY)    EKG EKG Interpretation  Date/Time:  Thursday April 23 2021 18:30:07 EST Ventricular Rate:  106 PR Interval:  232 QRS Duration: 110 QT Interval:  346 QTC Calculation: 459 R Axis:   121 Text Interpretation: Sinus tachycardia with 1st degree A-V block with occasional Premature ventricular complexes Right axis deviation Abnormal ECG No acute STEMI Confirmed by Madalyn Rob [phone]) on 04/23/2021 10:16:59 PM  Radiology CT Angio Chest/Abd/Pel for Dissection W and/or W/WO  Result Date: 04/23/2021 CLINICAL  DATA:  Sudden onset chest pain with nausea. EXAM: CT ANGIOGRAPHY CHEST, ABDOMEN AND PELVIS TECHNIQUE: Non-contrast CT of the chest was initially obtained. Multidetector CT imaging through the chest, abdomen and pelvis was performed using the standard protocol during bolus administration of intravenous contrast. Multiplanar reconstructed images and MIPs were obtained and reviewed to evaluate the vascular anatomy. RADIATION DOSE REDUCTION: This exam was performed according to the departmental dose-optimization program which includes automated exposure control, adjustment of the mA and/or kV according to patient size and/or use of iterative reconstruction technique. CONTRAST:  64m OMNIPAQUE IOHEXOL 350 MG/ML SOLN COMPARISON:  PA and lateral chest series 12/18/2020. No prior cross-sectional imaging for comparison. FINDINGS: CTA CHEST FINDINGS Cardiovascular: There is mild-to-moderate panchamber cardiomegaly. There is thinning in the apical myocardium inferiorly consistent with an old infarct. Minimal calcification in the proximal LAD coronary artery with no other visible coronary artery calcifications. Pulmonary arteries are normal in caliber and centrally clear. Thoracic aorta is normal caliber, with minimal scattered noncalcified nonulcerative plaque in the descending segment. No calcified thoracic aortic plaque is seen. There is no dissection, with mild tortuosity in the descending segment noted. The great vessels opacified normally. There is a normal variant  brachiobicarotid trunk. Both subclavian arteries are clear. There are no findings of acute right heart strain. There is distention of the superior pulmonary veins. Mediastinum/Nodes: Small hiatal hernia. There is moderate thickening of the distal thoracic esophagus warranting consideration of endoscopic follow-up. There few scattered borderline sized bilateral hilar lymph nodes. No intrathoracic enlarged lymph nodes are identified. There is 7.5 mm solid nodule in the inferior pole right lobe of the thyroid gland, otherwise unremarkable thyroid with unremarkable trachea. No imaging follow-up is recommended. Lungs/Pleura: There is no pleural effusion, thickening or pneumothorax or pulmonary edema. 9 Minimal reticular scarring is noted at the lung apices and scattered linear scarring or atelectasis in the bases. Faint mosaic attenuation in the lower lobes is seen most likely due to microatelectasis alongside areas of air trapping. No focal pneumonia is seen. Central airways are clear. There is a 5 mm noncalcified subpleural nodule in the right lower lobe on series 8 axial 73 and a 4 mm ground-glass noncalcified right lower lobe nodule on axial 93, without other appreciable nodules. Musculoskeletal: No chest wall abnormality. No acute or significant osseous findings. There is mild thoracic kyphosis, with multilevel degenerative disc disease with spondylosis in the thoracic spine Review of the MIP images confirms the above findings. CTA ABDOMEN AND PELVIS FINDINGS VASCULAR Aorta: Normal caliber aorta without aneurysm, dissection, vasculitis or significant stenosis. There are no appreciable plaques. Celiac: Patent without evidence of aneurysm, dissection, vasculitis or significant stenosis. SMA: Patent without evidence of aneurysm, dissection, vasculitis or significant stenosis. Renals: Both renal arteries are patent without evidence of aneurysm, dissection, vasculitis, fibromuscular dysplasia or significant stenosis.  There is a single right renal artery and 2 left renal arteries arising in close tandem. No visible plaques. IMA: Patent without evidence of aneurysm, dissection, vasculitis or significant stenosis. Inflow: Patent without evidence of aneurysm, dissection, vasculitis or significant stenosis. There is mild scattered nonstenosing calcific plaque in the common iliac arteries and in both proximal to mid internal iliac arteries. There is a diminutively small and severely stenotic right external iliac arteries throughout its length. The left external iliac artery and both common femoral arteries, and both visualized proximal superficial femoral arteries are patent. Clinical correlation for right lower extremity claudication is recommended. The visualized circumflex and deep femoral arteries also show flow. Veins:  No obvious venous abnormality within the limitations of this arterial phase study. The veins are unopacified. There are numerous pelvic venous phleboliths. Review of the MIP images confirms the above findings. NON-VASCULAR Hepatobiliary: No focal liver abnormality is seen. No gallstones, gallbladder wall thickening, or biliary dilatation. Pancreas: No mass enhancement , ductal dilatation or peripancreatic edema. There is a 9 mm lipoma of -70 Hounsfield units in the pancreatic head. Spleen: No mass enhancement or splenomegaly. The spleen is somewhat small in size for the size of the patient. Adrenals/Urinary Tract: There is no adrenal or renal cortical mass, no evidence of urinary stones or obstruction. There is a 1 cm gonadal vein phlebolith on the left anterior to the common iliac vessel bifurcation. Stomach/Bowel: Unremarkable stomach and unopacified small bowel. Normal caliber appendix. Diffuse colonic diverticulosis is seen. There is wall prominence versus nondistention in the transverse and proximal descending colon. Advanced sigmoid diverticula noted without evidence of acute diverticulitis. Lymphatic: There  are no enlarged abdominopelvic lymph nodes. Reproductive: Status post hysterectomy. No adnexal masses. Other: Small umbilical fat hernia. There is no free air, hemorrhage or fluid. Musculoskeletal: Advanced degenerative disc disease L5-S1. No worrisome regional bone lesion. Moderate spurring of the pubic symphysis. There are mild features of chronic sacroiliitis. Advanced L4-5 facet hypertrophy. Review of the MIP images confirms the above findings. IMPRESSION: 1. No aortic dissection. In the thoracic aorta there is mild descending segment tortuosity, with minimal scattered noncalcified nonulcerative plaque. There is no focal abnormality in the abdominal aorta. 2. 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 No pericardial effusion. 3. Normal caliber centrally clear pulmonary arteries with mildly distended pulmonary veins. No pulmonary edema or pleural effusion , no active pneumonia. 4. Mosaic attenuation in the lower lobes, most likely due to microatelectasis alongside areas of air trapping. Central airways are clear. 5. 4 mm and 5 mm right lower lobe noncalcified nodules. Recommend a noncontrast chest CT at 3-6 months. If stable, consider follow-up noncontrast chest CTs at 2 and 4 years. These guidelines do not apply to immunocompromised patients and patients with cancer. F/U in patients with significant comorbidities as clinically warranted. For lung cancer screening, adhere to Lung-RADS guidelines. Reference: Radiology. 2017; 284 (1): 228-43. 6. Hiatal hernia with moderately thickened distal thoracic esophagus. Consider endoscopic follow-up. 7. Mild calcification of the common iliac and internal iliac arteries, with diminutively small and severely stenotic right external iliac artery and normal caliber left external iliac artery, with flow preserved in the common femoral and proximal superficial femoral arteries.  Correlate clinically for right lower extremity claudication. 8. No abdominal visceral artery stenosis. 9. 9 mm lipoma in the head of pancreas. 10. Diffuse colonic diverticula with colitis versus nondistention in the transverse and proximal descending colon. 11. Umbilical fat hernia additional findings described above. Electronically Signed   By: Telford Nab M.D.   On: 04/23/2021 20:34    Procedures .Critical Care Performed by: Lucrezia Starch, MD Authorized by: Lucrezia Starch, MD   Critical care provider statement:    Critical care time (minutes):  38   Critical care was time spent personally by me on the following activities:  Development of treatment plan with patient or surrogate, discussions with consultants, evaluation of patient's response to treatment, examination of patient, ordering and review of laboratory studies, ordering and review of radiographic studies, ordering and performing treatments and interventions, pulse oximetry, re-evaluation of patient's condition and review of old charts  Medications Ordered in ED Medications  aspirin chewable tablet 324 mg (has no administration in time range)  aspirin EC tablet 81 mg (has no administration in time range)  nitroGLYCERIN (NITROSTAT) SL tablet 0.4 mg (0.4 mg Sublingual Given 04/23/21 2122)  acetaminophen (TYLENOL) tablet 650 mg (has no administration in time range)  levothyroxine (SYNTHROID) tablet 200 mcg (has no administration in time range)  potassium chloride (KLOR-CON) packet 40 mEq (has no administration in time range)  senna-docusate (Senokot-S) tablet 2 tablet (has no administration in time range)  rosuvastatin (CRESTOR) tablet 40 mg (has no administration in time range)  heparin ADULT infusion 100 units/mL (25000 units/241m) (900 Units/hr Intravenous New Bag/Given 04/23/21 2147)  ondansetron (ZOFRAN) injection 4 mg (4 mg Intravenous Given 04/23/21 1907)  iohexol (OMNIPAQUE) 350 MG/ML injection 80 mL (80 mLs  Intravenous Contrast Given 04/23/21 1936)    ED Course/ Medical Decision Making/ A&P Clinical Course as of 04/23/21 2233  Thu Apr 23, 2021  1917 [facility] 8[facility] [LM]    Clinical Course User Index [LM] MRoque Lias                          Medical Decision Making Risk Decision regarding hospitalization.   76year old lady with history of atrial fibrillation presenting to the emergency room with concern for chest pain and back pain.  On initial exam, patient appears somewhat uncomfortable secondary to pain but her initial vital signs were grossly stable, slight hypertension.  Per my review of her EKG, did not meet definite STEMI criteria however given the sudden onset and severity of her pain and associated diaphoresis and nausea, had high clinical suspicion for either ACS or aortic dissection.  Case was discussed with STEMI doc soon after PA MPercell Millerand myself evaluated in triage - Mchalhany reviewed initial ekgs and did not feel met stemi criteria and recommended getting trops. Stat CTA chest abdomen pelvis was obtained to rule out acute aortic pathology such as aneurysm or dissection and this was negative.  Her initial troponin came back elevated.  Case again reviewed with STEMI doc who recommended calling general cardiologist on-call to admit as NSTEMI.  Patient was given aspirin, started on heparin drip, additionally ordered nitroglycerin.  I discussed the case with Dr. SConley Canalwho evaluated and agreed to admit patient.  After patient had been admitted to cardiology for NSTEMI, they initiated a STEMI alert and patient will be taken to Cath Lab tonight for further management due to persistent, ongoing pain.        Final Clinical Impression(s) / ED Diagnoses Final diagnoses:  NSTEMI (non-ST elevated myocardial infarction) ([facility]  Pulmonary nodule  Elevated troponin    Rx / DC Orders ED Discharge Orders     None         DLucrezia Starch  MD 04/23/21 2233

## 2021-04-23 NOTE — H&P (Signed)
Cardiology Admission History and Physical:   Patient ID: Sheri Morgan MRN: [id number]; DOB: 28-Aug-1945   Admission date: 04/23/2021  PCP:  Etta Grandchild, MD   [facility] Providers Cardiologist:  Parke Poisson, MD        Chief Complaint:  chest pain  Patient Profile:   Sheri Morgan is a 76 y.o. adult with Afib (not taking prescribed Eliquis), HTN and HFrEF (EF = 30-35%) who is being seen 04/23/2021 for the evaluation of NSTEMI.  History of Present Illness:   Sheri Morgan reports that for the past 2 weeks she has been experiencing SOB and chest pain with exertion. She describes the pain as being pressure like in the center of her chest. The pain is severe and radiates to her back. Today the patient was in the kitchen when the chest pain came on suddenly at rest. She initially thought that it was indigestion so she drank warm water, but to no help. She eventually developed nausea and emesis. She denies fevers, chills, palpitations, abdominal pain, weakness, numbness, PND, orthopnea, and swelling. The chest pain persisted so she came to the ED for evaluation.   In the ED her VS were afebrile, BP 155/103, HR 104, RR 16 and satting 99% on RA. Labs notable for troponin 204 -> 379 and BNP 379.1. EKG showed sub-1 mm STE in I and aVL and sinus tachycardia. CT dissection protocol was negative for dissection. Dr. Clifton James reviewed the EKG and did not feel that it represented a STEMI. The patient was given 3 SLNGT while in the ED but continued to have chest pain. After discussing the case further with Dr. Clifton James, we elected to pursue LHC overnight as opposed to in the morning.  In route to the cath lab the patient had stable VT. She underwent LHC which showed non-obstructive CAD with an elevated LVEDP and reduced EF. She was given 40mg  IV lasix and started on amiodarone. She was admitted to cardiology.    Past Medical History:  Diagnosis Date   Anxiety    Atypical atrial flutter (HCC)     Fibromyalgia    Hypertension    Paroxysmal atrial fibrillation (HCC)    Premature atrial contractions    Premature ventricular contraction     Past Surgical History:  Procedure Laterality Date   ABDOMINAL HYSTERECTOMY     LUMBAR LAMINECTOMY     THYROIDECTOMY       Medications Prior to Admission: Prior to Admission medications   Medication Sig Start Date End Date Taking? Authorizing Provider  apixaban (ELIQUIS) 5 MG TABS tablet Take 1 tablet (5 mg total) by mouth 2 (two) times daily. Patient not taking: Reported on 11/12/2020 05/09/20   05/11/20, MD  gabapentin (NEURONTIN) 300 MG capsule Take 1 capsule (300 mg total) by mouth 3 (three) times daily as needed. Patient not taking: Reported on 11/12/2020 11/12/19   11/14/19, PA-C  hydrochlorothiazide (HYDRODIURIL) 25 MG tablet Take 1 tablet (25 mg total) by mouth daily. 12/18/20   02/17/21, NP  levothyroxine (SYNTHROID) 200 MCG tablet Take 1 tablet (200 mcg total) by mouth daily before breakfast. 12/18/20   02/17/21, NP  levothyroxine (SYNTHROID) 25 MCG tablet Take 25 mcg by mouth daily before breakfast.    [provider]  LORazepam (ATIVAN) 0.5 MG tablet Take 0.5 mg by mouth every 8 (eight) hours. Patient not taking: Reported on 11/12/2020    [provider]  predniSONE (STERAPRED UNI-PAK 21 TAB)  10 MG (21) TBPK tablet Take by mouth daily. Take 6 tabs by mouth daily  for 2 days, then 5 tabs for 2 days, then 4 tabs for 2 days, then 3 tabs for 2 days, 2 tabs for 2 days, then 1 tab by mouth daily for 2 days 12/18/20   Ivette Loyal, NP  rosuvastatin (CRESTOR) 10 MG tablet Take 1 tablet (10 mg total) by mouth daily. Patient not taking: Reported on 11/12/2020 02/11/20   Etta Grandchild, MD     Allergies:    Allergies  Allergen Reactions   Codeine Itching    Social History:   Social History   Socioeconomic History   Marital status: Divorced    Spouse name: Not on file   Number of  children: Not on file   Years of education: Not on file   Highest education level: Not on file  Occupational History   Not on file  Tobacco Use   Smoking status: Never   Smokeless tobacco: Never  Vaping Use   Vaping Use: Never used  Substance and Sexual Activity   Alcohol use: No   Drug use: No   Sexual activity: Never  Other Topics Concern   Not on file  Social History Narrative   Retired from [employer]   Social Determinants of Corporate investment banker Strain: Low Risk    Difficulty of Paying Living Expenses: Not hard at all  Food Insecurity: No Food Insecurity   Worried About Programme researcher, broadcasting/film/video in the Last Year: Never true   Barista in the Last Year: Never true  Transportation Needs: No Transportation Needs   Lack of Transportation (Medical): No   Lack of Transportation (Non-Medical): No  Physical Activity: Insufficiently Active   Days of Exercise per Week: 1 day   Minutes of Exercise per Session: 30 min  Stress: No Stress Concern Present   Feeling of Stress : Not at all  Social Connections: Moderately Integrated   Frequency of Communication with Friends and Family: More than three times a week   Frequency of Social Gatherings with Friends and Family: Not on file   Attends Religious Services: More than 4 times per year   Active Member of [employer] or Organizations: Yes   Attends Banker Meetings: Never   Marital Status: Divorced  Catering manager Violence: Not At Risk   Fear of Current or Ex-Partner: No   Emotionally Abused: No   Physically Abused: No   Sexually Abused: No    Family History:   The patient's family history includes Alcoholism in [place]. Domagalski's father; Diabetes in [place]. Ruvalcaba's mother.    ROS:  Please see the history of present illness.  All other ROS reviewed and negative.     Physical Exam/Data:   Vitals:   04/23/21 1843  BP: (!) 155/103  Pulse: (!) 104  Resp: 16  Temp: 98.1 F (36.7 C)  TempSrc: Oral  SpO2: 99%    No intake or output data in the 24 hours ending 04/23/21 2014 Last 3 Weights 06/11/2020 05/30/2020 04/18/2020  Weight (lbs) 213 lb 12.8 oz 214 lb 9.6 oz 214 lb  Weight (kg) 96.979 kg 97.342 kg 97.07 kg     There is no height or weight on file to calculate BMI.  General:  Well nourished, well developed, in no acute distress, very pleasant HEENT: normal, atraumatic, normocephalic  Neck: JVD to mid-neck Vascular: Distal pulses 2+ bilaterally   Cardiac:  normal S1, S2; tachycardic; no m/r/g Lungs:  Faint bibasilar rales bilaterally, end expiratory wheezes heard throughout, no ronchi Abd: soft, nontender, no hepatomegaly  Ext: no edema, cool extremities  Musculoskeletal:  No deformities, BUE and BLE strength normal and equal Skin: warm and dry  Neuro:  CNs 2-12 intact, no focal abnormalities noted Psych:  Normal affect    EKG:  The ECG that was done  was personally reviewed and demonstrates sinus tachycardia, 1st degree AV block, occasional PVCs  Relevant CV Studies:  TTE 07/14/20:  IMPRESSIONS     1. Left ventricular ejection fraction, by estimation, is 30 to 35%. The  left ventricle has moderately decreased function. The left ventricle  demonstrates global hypokinesis. Left ventricular diastolic parameters are  indeterminate.   2. Right ventricular systolic function is normal. The right ventricular  size is normal. There is mildly elevated pulmonary artery systolic  pressure.   3. Left atrial size was severely dilated.   4. The mitral valve is normal in structure. Trivial mitral valve  regurgitation. No evidence of mitral stenosis.   5. The aortic valve is tricuspid. Aortic valve regurgitation is not  visualized. No aortic stenosis is present.   6. The inferior vena cava is normal in size with greater than 50%  respiratory variability, suggesting right atrial pressure of 3 mmHg.    LHC 04/23/21:  Prox LAD lesion is 30% stenosed.   There is moderate left ventricular systolic  dysfunction.   LV end diastolic pressure is severely elevated.   The left ventricular ejection fraction is 35-45% by visual estimate.   Mild non-obstructive CAD Elevated LVEDP LV wall motion abnormality noted with hand injection. (no power injection of the LV given elevated filling pressures). This may represent a Takotsubo's cardiomyopathy.  Ventricular tachycardia prior to cath.    LV 153/35/33 AO 161/97   Recommendations: She will be admitted to the ICU. No coronary culprit for NSTEMI. I suspect that she may have a stress induced cardiomyopathy (Takotsubo's cardiomyopathy). Echo in the am. Continue amiodarone drip given VT and ectopy. Resume IV heparin 8 hours post sheath pull. IV Lasix tonight. Continue ASA. Will start a statin and low dose beta blocker.   Laboratory Data:  High Sensitivity Troponin:   Recent Labs  Lab 04/23/21 1847  TROPONINIHS 204*      Chemistry Recent Labs  Lab 04/23/21 1847 04/23/21 1909  NA 139 140  K 3.4* 3.5  CL 103 103  CO2 25  --   GLUCOSE 134* 138*  BUN 15 16  CREATININE 1.41* 1.30*  CALCIUM 9.0  --   GFRNONAA 38*  --   ANIONGAP 11  --     No results for input(s): PROT, ALBUMIN, AST, ALT, ALKPHOS, BILITOT in the last 168 hours. Lipids No results for input(s): CHOL, TRIG, HDL, LABVLDL, LDLCALC, CHOLHDL in the last 168 hours. Hematology Recent Labs  Lab 04/23/21 1847 04/23/21 1909  WBC 7.0  --   RBC 4.29  --   HGB 12.7 15.0  HCT 39.7 44.0  MCV 92.5  --   MCH 29.6  --   MCHC 32.0  --   RDW 16.4*  --   PLT 180  --    Thyroid No results for input(s): TSH, FREET4 in the last 168 hours. BNPNo results for input(s): BNP, PROBNP in the last 168 hours.  DDimer No results for input(s): DDIMER in the last 168 hours.   Radiology/Studies:  No results found.   Assessment and Plan:  Sheri Morgan is a 76 y.o. adult with Afib (not taking prescribed Eliquis), HTN and HFrEF (EF = 30-35%) who is being seen 04/23/2021 for the evaluation of  NSTEMI.  #MINOCA ::Patient presented with chest pain and found to have elevated troponis and ischemic changes on EKG. Taken to the cath lab and cath only showed non-obstructive CAD. Given her ACS syndrome, I would classify this as MINOCA. She was found to have a reduced EF, but this has not been quantified yet. Takotsubo's in on the Ddx as well. For now will treat as MINOCA with statin, ASA and beta blocker once BP permits. Will get a formal TTE in the AM. -s/p LHC with non-obstructive CAD -continue ASA 81 mg daily -start rosuvastatin 40 mg daily -start metoprolol tartrate 25 mg BID, but hold prn for hypotension -TTE in AM  #VT ::Developed VT in route to the cath lab. Ischemic in etiology vs CHF? Started on amio and metoprolol. Formal TTE in AM. -continue amiodarone -continue metoprolol  #HFrEF (EF = 30-35%) ::Diagnosed with HFrEF in May 2022. Not on GDMT because lost to follow up? Certainly needs it now. Will get repeat TTE and start meds. Looks volume up on exam so will diurese. -TTE as above -metoprolol as above -start ACEI/ARB or ARNI prior to discharge -continue 40 mg IV lasix BID -strict I/Os -daily weights   #Afib ::She is prescribed Eliquis but does not take it due to "personal reasons." Her CHADS-VASc score is elevated so she would benefit from [facility]. This will need to be revisited prior to discharge. -discuss starting apixaban again  #HTN -hold home HCTZ and likely d/c on discharge in lieu of better GDMT for HFrEF   Risk Assessment/Risk Scores:    TIMI Risk Score for Unstable Angina or Non-ST Elevation MI:   The patient's TIMI risk score is 5, which indicates a 26% risk of all cause mortality, new or recurrent myocardial infarction or need for urgent revascularization in the next 14 days.  New York Heart Association (NYHA) Functional Class NYHA Class I  CHA2DS2-VASc Score = 5  This indicates a 7.2% annual risk of stroke.    Severity of Illness: The appropriate  patient status for this patient is INPATIENT. Inpatient status is judged to be reasonable and necessary in order to provide the required intensity of service to ensure the patient's safety. The patient's presenting symptoms, physical exam findings, and initial radiographic and laboratory data in the context of their chronic comorbidities is felt to place them at high risk for further clinical deterioration. Furthermore, it is not anticipated that the patient will be medically stable for discharge from the hospital within 2 midnights of admission.   * I certify that at the point of admission it is my clinical judgment that the patient will require inpatient hospital care spanning beyond 2 midnights from the point of admission due to high intensity of service, high risk for further deterioration and high frequency of surveillance required.*   For questions or updates, please contact [facility] Please consult [email or web address] for contact info under     Signed, Karl Ito, MD  04/23/2021 8:14 PM

## 2021-04-24 ENCOUNTER — Encounter (HOSPITAL_COMMUNITY): Payer: Self-pay | Admitting: Cardiovascular Disease

## 2021-04-24 ENCOUNTER — Inpatient Hospital Stay (HOSPITAL_COMMUNITY): Payer: Medicare Other

## 2021-04-24 ENCOUNTER — Other Ambulatory Visit (HOSPITAL_COMMUNITY): Payer: Self-pay

## 2021-04-24 DIAGNOSIS — R600 Localized edema: Secondary | ICD-10-CM | POA: Diagnosis not present

## 2021-04-24 DIAGNOSIS — I214 Non-ST elevation (NSTEMI) myocardial infarction: Secondary | ICD-10-CM

## 2021-04-24 DIAGNOSIS — E782 Mixed hyperlipidemia: Secondary | ICD-10-CM

## 2021-04-24 DIAGNOSIS — I472 Ventricular tachycardia, unspecified: Secondary | ICD-10-CM | POA: Diagnosis not present

## 2021-04-24 DIAGNOSIS — I5020 Unspecified systolic (congestive) heart failure: Secondary | ICD-10-CM | POA: Diagnosis not present

## 2021-04-24 LAB — ECHOCARDIOGRAM COMPLETE
AR max vel: 2.41 cm2
AV Peak grad: 4.9 mmHg
Ao pk vel: 1.11 m/s
Area-P 1/2: 3.68 cm2
Height: 63.504 in
MV M vel: 3.63 m/s
MV Peak grad: 52.6 mmHg
S' Lateral: 4.9 cm
Single Plane A4C EF: 26.1 %
Weight: 3245.17 oz

## 2021-04-24 LAB — BASIC METABOLIC PANEL
Anion gap: 14 (ref 5–15)
Anion gap: 11 (ref 5–15)
BUN: 14 mg/dL (ref 8–23)
BUN: 13 mg/dL (ref 8–23)
CO2: 22 mmol/L (ref 22–32)
CO2: 31 mmol/L (ref 22–32)
Calcium: 9.2 mg/dL (ref 8.9–10.3)
Calcium: 9.4 mg/dL (ref 8.9–10.3)
Chloride: 100 mmol/L (ref 98–111)
Chloride: 95 mmol/L — ABNORMAL LOW (ref 98–111)
Creatinine, Ser: 1.23 mg/dL — ABNORMAL HIGH (ref 0.44–1.00)
Creatinine, Ser: 1.28 mg/dL — ABNORMAL HIGH (ref 0.44–1.00)
GFR, Estimated: 45 mL/min — ABNORMAL LOW (ref >60)
GFR, Estimated: 43 mL/min — ABNORMAL LOW (ref >60)
Glucose, Bld: 132 mg/dL — ABNORMAL HIGH (ref 70–99)
Glucose, Bld: 117 mg/dL — ABNORMAL HIGH (ref 70–99)
Potassium: 4.1 mmol/L (ref 3.5–5.1)
Potassium: 3.9 mmol/L (ref 3.5–5.1)
Sodium: 136 mmol/L (ref 135–145)
Sodium: 137 mmol/L (ref 135–145)

## 2021-04-24 LAB — CBC
HCT: 41.8 % (ref 36.0–46.0)
Hemoglobin: 13.8 g/dL (ref 12.0–15.0)
MCH: 29.8 pg (ref 26.0–34.0)
MCHC: 33 g/dL (ref 30.0–36.0)
MCV: 90.3 fL (ref 80.0–100.0)
Platelets: 144 10*3/uL — ABNORMAL LOW (ref 150–400)
RBC: 4.63 MIL/uL (ref 3.87–5.11)
RDW: 16.4 % — ABNORMAL HIGH (ref 11.5–15.5)
WBC: 7.3 10*3/uL (ref 4.0–10.5)
nRBC: 0 % (ref 0.0–0.2)

## 2021-04-24 LAB — LACTIC ACID, PLASMA: Lactic Acid, Venous: 2.3 mmol/L (ref 0.5–1.9)

## 2021-04-24 LAB — GLUCOSE, CAPILLARY: Glucose-Capillary: 142 mg/dL — ABNORMAL HIGH (ref 70–99)

## 2021-04-24 LAB — MRSA NEXT GEN BY PCR, NASAL: MRSA by PCR Next Gen: NOT DETECTED

## 2021-04-24 LAB — MAGNESIUM: Magnesium: 2 mg/dL (ref 1.7–2.4)

## 2021-04-24 MED ORDER — HYDRALAZINE HCL 20 MG/ML IJ SOLN: 10.0000 mg | INTRAMUSCULAR | Status: AC | PRN

## 2021-04-24 MED ORDER — SODIUM CHLORIDE 0.9% FLUSH: 3.0000 mL | INTRAVENOUS | Status: DC | PRN

## 2021-04-24 MED ORDER — LIDOCAINE HCL (PF) 1 % IJ SOLN
INTRAMUSCULAR | Status: DC | PRN
  Administered 2021-04-23: 2 mL via INTRADERMAL

## 2021-04-24 MED ORDER — SODIUM CHLORIDE 0.9 % IV SOLN
250.0000 mL | INTRAVENOUS | Status: DC | PRN
  Administered 2021-04-26: 250 mL via INTRAVENOUS

## 2021-04-24 MED ORDER — AMIODARONE HCL IN DEXTROSE 360-4.14 MG/200ML-% IV SOLN
60.0000 mg/h | INTRAVENOUS | Status: AC
  Administered 2021-04-24: 60 mg/h via INTRAVENOUS

## 2021-04-24 MED ORDER — PANTOPRAZOLE SODIUM 40 MG PO TBEC
40.0000 mg | DELAYED_RELEASE_TABLET | Freq: Every day | ORAL | Status: DC
  Administered 2021-04-24 – 2021-05-11 (×18): 40 mg via ORAL
  Filled 2021-04-24 (×18): qty 1

## 2021-04-24 MED ORDER — POTASSIUM CHLORIDE CRYS ER 20 MEQ PO TBCR
20.0000 meq | EXTENDED_RELEASE_TABLET | Freq: Once | ORAL | Status: AC
  Administered 2021-04-24: 20 meq via ORAL
  Filled 2021-04-24: qty 1

## 2021-04-24 MED ORDER — DAPAGLIFLOZIN PROPANEDIOL 10 MG PO TABS
10.0000 mg | ORAL_TABLET | Freq: Every day | ORAL | Status: DC
  Administered 2021-04-24 – 2021-05-05 (×12): 10 mg via ORAL
  Filled 2021-04-24 (×12): qty 1

## 2021-04-24 MED ORDER — AMIODARONE HCL IN DEXTROSE 360-4.14 MG/200ML-% IV SOLN
30.0000 mg/h | INTRAVENOUS | Status: DC
  Filled 2021-04-24: qty 200

## 2021-04-24 MED ORDER — ACETAMINOPHEN 325 MG PO TABS
650.0000 mg | ORAL_TABLET | ORAL | Status: DC | PRN
  Filled 2021-04-24: qty 2

## 2021-04-24 MED ORDER — HEPARIN (PORCINE) 25000 UT/250ML-% IV SOLN: 900.0000 [IU]/h | INTRAVENOUS | Status: DC

## 2021-04-24 MED ORDER — LABETALOL HCL 5 MG/ML IV SOLN: 10.0000 mg | INTRAVENOUS | Status: AC | PRN

## 2021-04-24 MED ORDER — APIXABAN 5 MG PO TABS: 5.0000 mg | ORAL_TABLET | Freq: Two times a day (BID) | ORAL | Status: DC

## 2021-04-24 MED ORDER — SODIUM CHLORIDE 0.9% FLUSH
3.0000 mL | Freq: Two times a day (BID) | INTRAVENOUS | Status: DC
  Administered 2021-04-24 – 2021-04-26 (×4): 3 mL via INTRAVENOUS

## 2021-04-24 MED ORDER — APIXABAN 5 MG PO TABS
5.0000 mg | ORAL_TABLET | Freq: Two times a day (BID) | ORAL | Status: DC
  Administered 2021-04-24 – 2021-04-25 (×3): 5 mg via ORAL
  Filled 2021-04-24 (×3): qty 1

## 2021-04-24 MED ORDER — ORAL CARE MOUTH RINSE
15.0000 mL | Freq: Two times a day (BID) | OROMUCOSAL | Status: DC
  Administered 2021-04-24 – 2021-04-29 (×9): 15 mL via OROMUCOSAL

## 2021-04-24 MED ORDER — CHLORHEXIDINE GLUCONATE CLOTH 2 % EX PADS
6.0000 | MEDICATED_PAD | Freq: Every day | CUTANEOUS | Status: DC
  Administered 2021-04-24 – 2021-05-08 (×15): 6 via TOPICAL

## 2021-04-24 MED ORDER — ONDANSETRON HCL 4 MG/2ML IJ SOLN
4.0000 mg | Freq: Four times a day (QID) | INTRAMUSCULAR | Status: DC | PRN
  Administered 2021-04-24 – 2021-04-30 (×4): 4 mg via INTRAVENOUS
  Filled 2021-04-24 (×3): qty 2

## 2021-04-24 MED FILL — Nitroglycerin IV Soln 100 MCG/ML in D5W: INTRA_ARTERIAL | Qty: 10 | Status: AC

## 2021-04-24 MED FILL — Amiodarone HCl Inj 150 MG/3ML (50 MG/ML): INTRAVENOUS | Qty: 3 | Status: AC

## 2021-04-24 MED FILL — Verapamil HCl IV Soln 2.5 MG/ML: INTRAVENOUS | Qty: 2 | Status: AC

## 2021-04-24 NOTE — TOC Benefit Eligibility Note (Signed)
Patient Teacher, English as a foreign language completed.    The patient is currently admitted and upon discharge could be taking Eliquis 5 mg.  The current 30 day co-pay is, $37.00.   The patient is currently admitted and upon discharge could be taking Entresto 24-26 mg.  The current 30 day co-pay is, $37.00.   The patient is currently admitted and upon discharge could be taking Farxiga 10 mg.  The current 30 day co-pay is, $37.00.   The patient is currently admitted and upon discharge could be taking Jardiance 10 mg.  The current 30 day co-pay is, $37.00.   The patient is insured through [employer] Medicare Part D     [name], [place] Patient Advocate Specialist [place] Patient Advocate Team Direct Number: [phone]  Fax: [phone]

## 2021-04-24 NOTE — Progress Notes (Signed)
CARDIAC REHAB PHASE I   Pt assisted to BR. Offered to walk pt in hallway, pt declines at this time stating fatigue. Pt given MI book, Takotsubo printout, heart healthy diet, and HF booklet. Reviewed site care, restrictions, and exercise guidelines. Will refer to CRP II [place] Rufina Falco, RN BSN 04/24/2021 2:34 PM

## 2021-04-24 NOTE — Progress Notes (Signed)
Progress Note  Patient Name: Sheri Morgan Date of Encounter: 04/24/2021  [facility] Cardiologist: Elouise Munroe, MD   Subjective   Feels well.  No chest pain, no palpitations, no shortness of breath.  She notes leg swelling.  HCTZ has been helpful in the past for this.  Inpatient Medications    Scheduled Meds:  aspirin EC  81 mg Oral Daily   furosemide  40 mg Intravenous BID   levothyroxine  200 mcg Oral QAC breakfast   mouth rinse  15 mL Mouth Rinse BID   rosuvastatin  40 mg Oral Daily   senna-docusate  2 tablet Oral BID   sodium chloride flush  3 mL Intravenous Q12H   Continuous Infusions:  sodium chloride     amiodarone 30 mg/hr (04/24/21 0517)   heparin     PRN Meds: sodium chloride, acetaminophen, lidocaine (PF), nitroGLYCERIN, ondansetron (ZOFRAN) IV, sodium chloride flush   Vital Signs    Vitals:   04/24/21 0500 04/24/21 0600 04/24/21 0645 04/24/21 0700  BP: 105/82 108/76  (!) 94/48  Pulse: (!) 42 (!) 50 (!) 58 (!) 50  Resp: 17 (!) 21 16 18   Temp:      TempSrc:      SpO2: 100% 100% 98% 93%  Weight:      Height:        Intake/Output Summary (Last 24 hours) at 04/24/2021 0810 Last data filed at 04/24/2021 0640 Gross per 24 hour  Intake 222.87 ml  Output 1150 ml  Net -927.13 ml   Last 3 Weights 04/24/2021 04/23/2021 06/11/2020  Weight (lbs) 202 lb 13.2 oz 210 lb 1.6 oz 213 lb 12.8 oz  Weight (kg) 92 kg 95.3 kg 96.979 kg      Telemetry    Normal sinus rhythm, PVCs- Personally Reviewed  ECG    Normal sinus rhythm, PVCs- Personally Reviewed  Physical Exam   GEN: No acute distress.   Neck: No JVD Cardiac: RRR, no murmurs, rubs, or gallops.  Respiratory: Clear to auscultation bilaterally. GI: Soft, nontender, non-distended  MS: No edema; No deformity.  Right radial site stable.  No hematoma, 2+ right radial pulse Neuro:  Nonfocal  Psych: Normal affect   Labs    High Sensitivity Troponin:   Recent Labs  Lab 04/23/21 1847  04/23/21 2144  TROPONINIHS 204* 379*     Chemistry Recent Labs  Lab 04/23/21 1847 04/23/21 1909 04/23/21 2144 04/24/21 0249  NA 139 140  --  136  K 3.4* 3.5  --  4.1  CL 103 103  --  100  CO2 25  --   --  22  GLUCOSE 134* 138*  --  132*  BUN 15 16  --  14  CREATININE 1.41* 1.30*  --  1.23*  CALCIUM 9.0  --   --  9.2  MG  --   --  2.1 2.0  PROT  --   --  6.8  --   ALBUMIN  --   --  3.7  --   AST  --   --  58*  --   ALT  --   --  24  --   ALKPHOS  --   --  90  --   BILITOT  --   --  0.6  --   GFRNONAA 38*  --   --  45*  ANIONGAP 11  --   --  14    Lipids  Recent Labs  Lab 04/23/21 2146  CHOL 253*  TRIG 53  HDL 101  LDLCALC 141*  CHOLHDL 2.5    Hematology Recent Labs  Lab 04/23/21 1847 04/23/21 1909 04/24/21 0249  WBC 7.0  --  7.3  RBC 4.29  --  4.63  HGB 12.7 15.0 13.8  HCT 39.7 44.0 41.8  MCV 92.5  --  90.3  MCH 29.6  --  29.8  MCHC 32.0  --  33.0  RDW 16.4*  --  16.4*  PLT 180  --  144*   Thyroid  Recent Labs  Lab 04/23/21 2146  TSH 3.737    BNP Recent Labs  Lab 04/23/21 2144  BNP 379.1*    DDimer No results for input(s): DDIMER in the last 168 hours.   Radiology    CARDIAC CATHETERIZATION  Result Date: 04/23/2021   Prox LAD lesion is 30% stenosed.   There is moderate left ventricular systolic dysfunction.   LV end diastolic pressure is severely elevated.   The left ventricular ejection fraction is 35-45% by visual estimate. Mild non-obstructive CAD Elevated LVEDP LV wall motion abnormality noted with hand injection. (no power injection of the LV given elevated filling pressures). This may represent a Takotsubo's cardiomyopathy. Ventricular tachycardia prior to cath. LV 153/35/33 AO 161/97 Recommendations: She will be admitted to the ICU. No coronary culprit for NSTEMI. I suspect that she may have a stress induced cardiomyopathy (Takotsubo's cardiomyopathy). Echo in the am. Continue amiodarone drip given VT and ectopy. Resume IV heparin 8 hours  post sheath pull. IV Lasix tonight. Continue ASA. Will start a statin and low dose beta blocker.   CT Angio Chest/Abd/Pel for Dissection W and/or W/WO  Result Date: 04/23/2021 CLINICAL DATA:  Sudden onset chest pain with nausea. EXAM: CT ANGIOGRAPHY CHEST, ABDOMEN AND PELVIS TECHNIQUE: Non-contrast CT of the chest was initially obtained. Multidetector CT imaging through the chest, abdomen and pelvis was performed using the standard protocol during bolus administration of intravenous contrast. Multiplanar reconstructed images and MIPs were obtained and reviewed to evaluate the vascular anatomy. RADIATION DOSE REDUCTION: This exam was performed according to the departmental dose-optimization program which includes automated exposure control, adjustment of the mA and/or kV according to patient size and/or use of iterative reconstruction technique. CONTRAST:  76mL OMNIPAQUE IOHEXOL 350 MG/ML SOLN COMPARISON:  PA and lateral chest series 12/18/2020. No prior cross-sectional imaging for comparison. FINDINGS: CTA CHEST FINDINGS Cardiovascular: There is mild-to-moderate panchamber cardiomegaly. There is thinning in the apical myocardium inferiorly consistent with an old infarct. Minimal calcification in the proximal LAD coronary artery with no other visible coronary artery calcifications. Pulmonary arteries are normal in caliber and centrally clear. Thoracic aorta is normal caliber, with minimal scattered noncalcified nonulcerative plaque in the descending segment. No calcified thoracic aortic plaque is seen. There is no dissection, with mild tortuosity in the descending segment noted. The great vessels opacified normally. There is a normal variant brachiobicarotid trunk. Both subclavian arteries are clear. There are no findings of acute right heart strain. There is distention of the superior pulmonary veins. Mediastinum/Nodes: Small hiatal hernia. There is moderate thickening of the distal thoracic esophagus warranting  consideration of endoscopic follow-up. There few scattered borderline sized bilateral hilar lymph nodes. No intrathoracic enlarged lymph nodes are identified. There is 7.5 mm solid nodule in the inferior pole right lobe of the thyroid gland, otherwise unremarkable thyroid with unremarkable trachea. No imaging follow-up is recommended. Lungs/Pleura: There is no pleural effusion, thickening or pneumothorax or pulmonary edema. 9 Minimal reticular scarring is noted at  the lung apices and scattered linear scarring or atelectasis in the bases. Faint mosaic attenuation in the lower lobes is seen most likely due to microatelectasis alongside areas of air trapping. No focal pneumonia is seen. Central airways are clear. There is a 5 mm noncalcified subpleural nodule in the right lower lobe on series 8 axial 73 and a 4 mm ground-glass noncalcified right lower lobe nodule on axial 93, without other appreciable nodules. Musculoskeletal: No chest wall abnormality. No acute or significant osseous findings. There is mild thoracic kyphosis, with multilevel degenerative disc disease with spondylosis in the thoracic spine Review of the MIP images confirms the above findings. CTA ABDOMEN AND PELVIS FINDINGS VASCULAR Aorta: Normal caliber aorta without aneurysm, dissection, vasculitis or significant stenosis. There are no appreciable plaques. Celiac: Patent without evidence of aneurysm, dissection, vasculitis or significant stenosis. SMA: Patent without evidence of aneurysm, dissection, vasculitis or significant stenosis. Renals: Both renal arteries are patent without evidence of aneurysm, dissection, vasculitis, fibromuscular dysplasia or significant stenosis. There is a single right renal artery and 2 left renal arteries arising in close tandem. No visible plaques. IMA: Patent without evidence of aneurysm, dissection, vasculitis or significant stenosis. Inflow: Patent without evidence of aneurysm, dissection, vasculitis or significant  stenosis. There is mild scattered nonstenosing calcific plaque in the common iliac arteries and in both proximal to mid internal iliac arteries. There is a diminutively small and severely stenotic right external iliac arteries throughout its length. The left external iliac artery and both common femoral arteries, and both visualized proximal superficial femoral arteries are patent. Clinical correlation for right lower extremity claudication is recommended. The visualized circumflex and deep femoral arteries also show flow. Veins: No obvious venous abnormality within the limitations of this arterial phase study. The veins are unopacified. There are numerous pelvic venous phleboliths. Review of the MIP images confirms the above findings. NON-VASCULAR Hepatobiliary: No focal liver abnormality is seen. No gallstones, gallbladder wall thickening, or biliary dilatation. Pancreas: No mass enhancement , ductal dilatation or peripancreatic edema. There is a 9 mm lipoma of -70 Hounsfield units in the pancreatic head. Spleen: No mass enhancement or splenomegaly. The spleen is somewhat small in size for the size of the patient. Adrenals/Urinary Tract: There is no adrenal or renal cortical mass, no evidence of urinary stones or obstruction. There is a 1 cm gonadal vein phlebolith on the left anterior to the common iliac vessel bifurcation. Stomach/Bowel: Unremarkable stomach and unopacified small bowel. Normal caliber appendix. Diffuse colonic diverticulosis is seen. There is wall prominence versus nondistention in the transverse and proximal descending colon. Advanced sigmoid diverticula noted without evidence of acute diverticulitis. Lymphatic: There are no enlarged abdominopelvic lymph nodes. Reproductive: Status post hysterectomy. No adnexal masses. Other: Small umbilical fat hernia. There is no free air, hemorrhage or fluid. Musculoskeletal: Advanced degenerative disc disease L5-S1. No worrisome regional bone lesion.  Moderate spurring of the pubic symphysis. There are mild features of chronic sacroiliitis. Advanced L4-5 facet hypertrophy. Review of the MIP images confirms the above findings. IMPRESSION: 1. No aortic dissection. In the thoracic aorta there is mild descending segment tortuosity, with minimal scattered noncalcified nonulcerative plaque. There is no focal abnormality in the abdominal aorta. 2. 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 No pericardial effusion. 3. Normal caliber centrally clear pulmonary arteries with mildly distended pulmonary veins. No pulmonary edema or pleural effusion , no active pneumonia. 4. Mosaic attenuation in the lower lobes, most likely due  to microatelectasis alongside areas of air trapping. Central airways are clear. 5. 4 mm and 5 mm right lower lobe noncalcified nodules. Recommend a noncontrast chest CT at 3-6 months. If stable, consider follow-up noncontrast chest CTs at 2 and 4 years. These guidelines do not apply to immunocompromised patients and patients with cancer. F/U in patients with significant comorbidities as clinically warranted. For lung cancer screening, adhere to Lung-RADS guidelines. Reference: Radiology. 2017; 284 (1): 228-43. 6. Hiatal hernia with moderately thickened distal thoracic esophagus. Consider endoscopic follow-up. 7. Mild calcification of the common iliac and internal iliac arteries, with diminutively small and severely stenotic right external iliac artery and normal caliber left external iliac artery, with flow preserved in the common femoral and proximal superficial femoral arteries. Correlate clinically for right lower extremity claudication. 8. No abdominal visceral artery stenosis. 9. 9 mm lipoma in the head of pancreas. 10. Diffuse colonic diverticula with colitis versus nondistention in the transverse and proximal descending colon. 11. Umbilical fat hernia  additional findings described above. Electronically Signed   By: Telford Nab M.D.   On: 04/23/2021 20:34    Cardiac Studies   Cath on 2/9 PM:  Prox LAD lesion is 30% stenosed.   There is moderate left ventricular systolic dysfunction.   LV end diastolic pressure is severely elevated.   The left ventricular ejection fraction is 35-45% by visual estimate.   Mild non-obstructive CAD Elevated LVEDP LV wall motion abnormality noted with hand injection. (no power injection of the LV given elevated filling pressures). This may represent a Takotsubo's cardiomyopathy.  Ventricular tachycardia prior to cath.    LV 153/35/33 AO 161/97   Recommendations: She will be admitted to the ICU. No coronary culprit for NSTEMI. I suspect that she may have a stress induced cardiomyopathy (Takotsubo's cardiomyopathy). Echo in the am. Continue amiodarone drip given VT and ectopy. Resume IV heparin 8 hours post sheath pull. IV Lasix tonight. Continue ASA. Will start a statin and low dose beta blocker.  Patient Profile     76 y.o. adult with history of atrial fibrillation, now with Takotsubo cardiomyopathy and ventricular tachycardia.  Assessment & Plan    Takotsubo cardiomyopathy/HFrEF: Initially, HFrEF was diagnosed in May 2022.  She has not been on appropriate medical therapy.  Will try to optimize medical therapy.  Limited by low blood pressure as noted below.  Can try SGLT2 inhibitor.  VT: Has been on Amio drip. Will try to titrate beta-blocker.  Limited by low blood pressure.  Systolics currently in the 90s.  Elevated troponin.  Nonobstructive CAD.  No angina at this time.  No history of hypertension: She was taking HCTZ for swelling.  Need to focus on medications that will help her LV dysfunction.  OK to move to floor later today if BP remains stable.    AFib: WIll start Eliquis today given her LV dysfunction.   This patients CHA2DS2-VASc Score and unadjusted Ischemic Stroke Rate (% per year) is  equal to 3.2 % stroke rate/year from a score of 3  Above score calculated as 1 point each if present [CHF, HTN, DM, Vascular=MI/PAD/Aortic Plaque, Age if 65-74, or Female] Above score calculated as 2 points each if present [Age > 75, or Stroke/TIA/TE]    For questions or updates, please contact [facility] Please consult [email or web address] for contact info under        Signed, Larae Grooms, MD  04/24/2021, 8:10 AM

## 2021-04-24 NOTE — Plan of Care (Signed)

## 2021-04-24 NOTE — Progress Notes (Signed)
°  Transition of Care [facility]) Screening Note   Patient Details  Name: Sheri Morgan Date of Birth: [birth year]   Transition of Care [facility]) CM/SW Contact:    Delilah Shan, LCSWA Phone Number: 04/24/2021, 2:53 PM    Transition of Care Department [facility]) has reviewed patient and no TOC needs have been identified at this time. We will continue to monitor patient advancement through interdisciplinary progression rounds. If new patient transition needs arise, please place a TOC consult.

## 2021-04-24 NOTE — Progress Notes (Signed)
Echocardiogram 2D Echocardiogram has been performed.  Eduard Roux 04/24/2021, 9:33 AM

## 2021-04-24 NOTE — Progress Notes (Addendum)
ANTICOAGULATION CONSULT NOTE - Initial Consult  Pharmacy Consult for Heparin Indication: NSTEMI  Allergies  Allergen Reactions   Codeine Itching    Patient Measurements: Height: 5' 3.5" (161.3 cm) Weight: 92 kg (202 lb 13.2 oz) IBW/kg (Calculated) : 53.56  Vital Signs: Temp: 97.9 F (36.6 C) (02/10 0000) Temp Source: Oral (02/10 0000) BP: 108/75 (02/10 0208) Pulse Rate: 77 (02/10 0208)  Labs: Recent Labs    04/23/21 1847 04/23/21 1909 04/23/21 2144 04/23/21 2146  HGB 12.7 15.0  --   --   HCT 39.7 44.0  --   --   PLT 180  --   --   --   APTT  --   --  24  --   LABPROT  --   --  12.7  --   INR  --   --  1.0  --   HEPARINUNFRC  --   --   --  <0.10*  CREATININE 1.41* 1.30*  --   --   TROPONINIHS 204*  --  379*  --     Estimated Creatinine Clearance (by C-G formula based on SCr of 1.3 mg/dL (H)) Female: 39.5 mL/min (A) Female: 48.3 mL/min (A)   Medical History: Past Medical History:  Diagnosis Date   Anxiety    Atypical atrial flutter (HCC)    Fibromyalgia    Hypertension    Paroxysmal atrial fibrillation (HCC)    Premature atrial contractions    Premature ventricular contraction      Assessment: 76 y/o F s/p cath, no culprit lesion found, starting heparin 8 hours after sheath pull, sheath was pulled 2/9 ~2330. Previously on Apixaban for afib-no longer taking. CBC good.   Goal of Therapy:  Heparin level 0.3-0.7 units/ml Monitor platelets by anticoagulation protocol: Yes   Plan:  Start heparin drip at 900 units/hr at 0730  Heparin level in 6-8 hours Daily CBC and heparin level Monitor for bleeding  Narda Bonds, PharmD, BCPS Clinical Pharmacist Phone: [phone]

## 2021-04-25 ENCOUNTER — Inpatient Hospital Stay (HOSPITAL_COMMUNITY): Payer: Medicare Other

## 2021-04-25 ENCOUNTER — Inpatient Hospital Stay: Payer: Self-pay

## 2021-04-25 DIAGNOSIS — I5023 Acute on chronic systolic (congestive) heart failure: Secondary | ICD-10-CM | POA: Diagnosis not present

## 2021-04-25 DIAGNOSIS — R570 Cardiogenic shock: Secondary | ICD-10-CM

## 2021-04-25 DIAGNOSIS — I472 Ventricular tachycardia, unspecified: Secondary | ICD-10-CM | POA: Diagnosis not present

## 2021-04-25 LAB — POCT I-STAT EG7
Acid-Base Excess: 8 mmol/L — ABNORMAL HIGH (ref 0.0–2.0)
Bicarbonate: 33.7 mmol/L — ABNORMAL HIGH (ref 20.0–28.0)
Calcium, Ion: 1.12 mmol/L — ABNORMAL LOW (ref 1.15–1.40)
HCT: 39 % (ref 36.0–46.0)
Hemoglobin: 13.3 g/dL (ref 12.0–15.0)
O2 Saturation: 67 %
Potassium: 2.8 mmol/L — ABNORMAL LOW (ref 3.5–5.1)
Sodium: 139 mmol/L (ref 135–145)
TCO2: 35 mmol/L — ABNORMAL HIGH (ref 22–32)
pCO2, Ven: 51.8 mmHg (ref 44.0–60.0)
pH, Ven: 7.421 (ref 7.250–7.430)
pO2, Ven: 35 mmHg (ref 32.0–45.0)

## 2021-04-25 LAB — COMPREHENSIVE METABOLIC PANEL
ALT: 20 U/L (ref 0–44)
AST: 36 U/L (ref 15–41)
Albumin: 3.6 g/dL (ref 3.5–5.0)
Alkaline Phosphatase: 89 U/L (ref 38–126)
Anion gap: 12 (ref 5–15)
BUN: 14 mg/dL (ref 8–23)
CO2: 31 mmol/L (ref 22–32)
Calcium: 8.8 mg/dL — ABNORMAL LOW (ref 8.9–10.3)
Chloride: 93 mmol/L — ABNORMAL LOW (ref 98–111)
Creatinine, Ser: 1.4 mg/dL — ABNORMAL HIGH (ref 0.44–1.00)
GFR, Estimated: 39 mL/min — ABNORMAL LOW (ref >60)
Glucose, Bld: 252 mg/dL — ABNORMAL HIGH (ref 70–99)
Potassium: 2.8 mmol/L — ABNORMAL LOW (ref 3.5–5.1)
Sodium: 136 mmol/L (ref 135–145)
Total Bilirubin: 0.8 mg/dL (ref 0.3–1.2)
Total Protein: 6.8 g/dL (ref 6.5–8.1)

## 2021-04-25 LAB — POCT I-STAT 7, (LYTES, BLD GAS, ICA,H+H)
Acid-Base Excess: 9 mmol/L — ABNORMAL HIGH (ref 0.0–2.0)
Bicarbonate: 34.4 mmol/L — ABNORMAL HIGH (ref 20.0–28.0)
Calcium, Ion: 1.14 mmol/L — ABNORMAL LOW (ref 1.15–1.40)
HCT: 40 % (ref 36.0–46.0)
Hemoglobin: 13.6 g/dL (ref 12.0–15.0)
O2 Saturation: 99 %
Patient temperature: 98.1
Potassium: 2.9 mmol/L — ABNORMAL LOW (ref 3.5–5.1)
Sodium: 138 mmol/L (ref 135–145)
TCO2: 36 mmol/L — ABNORMAL HIGH (ref 22–32)
pCO2 arterial: 49.5 mmHg — ABNORMAL HIGH (ref 32.0–48.0)
pH, Arterial: 7.448 (ref 7.350–7.450)
pO2, Arterial: 130 mmHg — ABNORMAL HIGH (ref 83.0–108.0)

## 2021-04-25 LAB — COOXEMETRY PANEL
Carboxyhemoglobin: 0.9 % (ref 0.5–1.5)
Methemoglobin: 1 % (ref 0.0–1.5)
O2 Saturation: 64.9 %
Total hemoglobin: 12.8 g/dL (ref 12.0–16.0)

## 2021-04-25 LAB — CBC
HCT: 38.3 % (ref 36.0–46.0)
HCT: 37.6 % (ref 36.0–46.0)
Hemoglobin: 12.3 g/dL (ref 12.0–15.0)
Hemoglobin: 12 g/dL (ref 12.0–15.0)
MCH: 29.9 pg (ref 26.0–34.0)
MCH: 29.3 pg (ref 26.0–34.0)
MCHC: 32.1 g/dL (ref 30.0–36.0)
MCHC: 31.9 g/dL (ref 30.0–36.0)
MCV: 93 fL (ref 80.0–100.0)
MCV: 91.9 fL (ref 80.0–100.0)
Platelets: 147 10*3/uL — ABNORMAL LOW (ref 150–400)
Platelets: 136 10*3/uL — ABNORMAL LOW (ref 150–400)
RBC: 4.12 MIL/uL (ref 3.87–5.11)
RBC: 4.09 MIL/uL (ref 3.87–5.11)
RDW: 16.5 % — ABNORMAL HIGH (ref 11.5–15.5)
RDW: 16.3 % — ABNORMAL HIGH (ref 11.5–15.5)
WBC: 6.5 10*3/uL (ref 4.0–10.5)
WBC: 6.9 10*3/uL (ref 4.0–10.5)
nRBC: 0 % (ref 0.0–0.2)
nRBC: 0 % (ref 0.0–0.2)

## 2021-04-25 LAB — LACTIC ACID, PLASMA: Lactic Acid, Venous: 1 mmol/L (ref 0.5–1.9)

## 2021-04-25 MED ORDER — SODIUM CHLORIDE 0.9% FLUSH: 10.0000 mL | INTRAVENOUS | Status: DC | PRN

## 2021-04-25 MED ORDER — NOREPINEPHRINE 4 MG/250ML-% IV SOLN: 0.0000 ug/min | INTRAVENOUS | Status: DC

## 2021-04-25 MED ORDER — FUROSEMIDE 10 MG/ML IJ SOLN
120.0000 mg | Freq: Once | INTRAVENOUS | Status: AC
  Administered 2021-04-26: 120 mg via INTRAVENOUS
  Filled 2021-04-25: qty 10

## 2021-04-25 MED ORDER — NOREPINEPHRINE 4 MG/250ML-% IV SOLN: 2.0000 ug/min | INTRAVENOUS | Status: DC

## 2021-04-25 MED ORDER — SODIUM CHLORIDE 0.9% FLUSH
10.0000 mL | Freq: Two times a day (BID) | INTRAVENOUS | Status: DC
  Administered 2021-04-25 – 2021-04-29 (×8): 10 mL
  Administered 2021-04-29 – 2021-04-30 (×2): 40 mL
  Administered 2021-04-30 – 2021-05-01 (×3): 10 mL
  Administered 2021-05-02: 20 mL
  Administered 2021-05-02: 08:00:00 10 mL
  Administered 2021-05-03: 30 mL
  Administered 2021-05-04 – 2021-05-10 (×6): 10 mL

## 2021-04-25 MED ORDER — LIDOCAINE BOLUS VIA INFUSION
100.0000 mg | Freq: Once | INTRAVENOUS | Status: AC
  Administered 2021-04-25: 100 mg via INTRAVENOUS
  Filled 2021-04-25: qty 100

## 2021-04-25 MED ORDER — HEPARIN (PORCINE) 25000 UT/250ML-% IV SOLN
1050.0000 [IU]/h | INTRAVENOUS | Status: DC
  Administered 2021-04-25: 1200 [IU]/h via INTRAVENOUS
  Administered 2021-04-26: 1050 [IU]/h via INTRAVENOUS
  Filled 2021-04-25 (×3): qty 250

## 2021-04-25 MED ORDER — DOBUTAMINE IN D5W 4-5 MG/ML-% IV SOLN
1.2500 ug/kg/min | INTRAVENOUS | Status: DC
  Administered 2021-04-25: 2.5 ug/kg/min via INTRAVENOUS
  Filled 2021-04-25: qty 250

## 2021-04-25 MED ORDER — LIDOCAINE IN D5W 4-5 MG/ML-% IV SOLN
1.0000 mg/min | INTRAVENOUS | Status: DC
  Administered 2021-04-25: 1 mg/min via INTRAVENOUS
  Filled 2021-04-25: qty 500

## 2021-04-25 MED ORDER — SODIUM CHLORIDE 0.9 % IV SOLN: 250.0000 mL | INTRAVENOUS | Status: DC

## 2021-04-25 NOTE — Progress Notes (Signed)
Secure chat sent to [place] re PICC ready to use and to remove any PIV's in place.

## 2021-04-25 NOTE — Plan of Care (Signed)

## 2021-04-25 NOTE — Progress Notes (Addendum)
Progress Note  Patient Name: Sheri Morgan Date of Encounter: 04/25/2021  [facility] Cardiologist: Elouise Munroe, MD   Subjective   Hypotensive and bradycardic overnight.  Nausea/vomiting overnight.  Inpatient Medications    Scheduled Meds:  apixaban  5 mg Oral BID   aspirin EC  81 mg Oral Daily   Chlorhexidine Gluconate Cloth  6 each Topical Daily   dapagliflozin propanediol  10 mg Oral Daily   furosemide  40 mg Intravenous BID   levothyroxine  200 mcg Oral QAC breakfast   mouth rinse  15 mL Mouth Rinse BID   pantoprazole  40 mg Oral Daily   rosuvastatin  40 mg Oral Daily   senna-docusate  2 tablet Oral BID   sodium chloride flush  3 mL Intravenous Q12H   Continuous Infusions:  sodium chloride     PRN Meds: sodium chloride, acetaminophen, lidocaine (PF), nitroGLYCERIN, ondansetron (ZOFRAN) IV, sodium chloride flush   Vital Signs    Vitals:   04/25/21 0115 04/25/21 0401 04/25/21 0500 04/25/21 0821  BP: (!) 76/50  (!) 82/61   Pulse: (!) 46  (!) 59   Resp: 13  14   Temp:  97.8 F (36.6 C)  97.8 F (36.6 C)  TempSrc:  Oral  Oral  SpO2: 93%  91%   Weight:      Height:        Intake/Output Summary (Last 24 hours) at 04/25/2021 0843 Last data filed at 04/25/2021 0700 Gross per 24 hour  Intake 470 ml  Output 1250 ml  Net -780 ml   Last 3 Weights 04/24/2021 04/23/2021 06/11/2020  Weight (lbs) 202 lb 13.2 oz 210 lb 1.6 oz 213 lb 12.8 oz  Weight (kg) 92 kg 95.3 kg 96.979 kg      Telemetry    Sinus bradycardia with frequent PVCs. Salvos of NSVT (polymorphic) triggered by short-long-short sequences - Personally Reviewed  ECG    Sinus bradycardia with frequent polymorphic PVCs - Personally Reviewed  Physical Exam   GEN: No acute distress.  Laying at 30 degrees.  Neck: No JVD Cardiac: irregular rhythm, no murmurs, rubs, or gallops.  Respiratory: Clear to auscultation bilaterally. GI: Soft, nontender, non-distended  MS: 1+ pitting LE edema; No  deformity. Neuro:  Nonfocal  Psych: Normal affect   Labs    High Sensitivity Troponin:   Recent Labs  Lab 04/23/21 1847 04/23/21 2144  TROPONINIHS 204* 379*     Chemistry Recent Labs  Lab 04/23/21 1847 04/23/21 1909 04/23/21 2144 04/24/21 0249 04/24/21 1913  NA 139 140  --  136 137  K 3.4* 3.5  --  4.1 3.9  CL 103 103  --  100 95*  CO2 25  --   --  22 31  GLUCOSE 134* 138*  --  132* 117*  BUN 15 16  --  14 13  CREATININE 1.41* 1.30*  --  1.23* 1.28*  CALCIUM 9.0  --   --  9.2 9.4  MG  --   --  2.1 2.0  --   PROT  --   --  6.8  --   --   ALBUMIN  --   --  3.7  --   --   AST  --   --  58*  --   --   ALT  --   --  24  --   --   ALKPHOS  --   --  90  --   --  BILITOT  --   --  0.6  --   --   GFRNONAA 38*  --   --  45* 43*  ANIONGAP 11  --   --  14 11    Lipids  Recent Labs  Lab 04/23/21 2146  CHOL 253*  TRIG 53  HDL 101  LDLCALC 141*  CHOLHDL 2.5    Hematology Recent Labs  Lab 04/23/21 1847 04/23/21 1909 04/24/21 0249 04/25/21 0549  WBC 7.0  --  7.3 6.5  RBC 4.29  --  4.63 4.12  HGB 12.7 15.0 13.8 12.3  HCT 39.7 44.0 41.8 38.3  MCV 92.5  --  90.3 93.0  MCH 29.6  --  29.8 29.9  MCHC 32.0  --  33.0 32.1  RDW 16.4*  --  16.4* 16.5*  PLT 180  --  144* 147*   Thyroid  Recent Labs  Lab 04/23/21 2146  TSH 3.737    BNP Recent Labs  Lab 04/23/21 2144  BNP 379.1*    DDimer No results for input(s): DDIMER in the last 168 hours.   Radiology    CARDIAC CATHETERIZATION  Result Date: 04/23/2021   Prox LAD lesion is 30% stenosed.   There is moderate left ventricular systolic dysfunction.   LV end diastolic pressure is severely elevated.   The left ventricular ejection fraction is 35-45% by visual estimate. Mild non-obstructive CAD Elevated LVEDP LV wall motion abnormality noted with hand injection. (no power injection of the LV given elevated filling pressures). This may represent a Takotsubo's cardiomyopathy. Ventricular tachycardia prior to cath. LV  153/35/33 AO 161/97 Recommendations: She will be admitted to the ICU. No coronary culprit for NSTEMI. I suspect that she may have a stress induced cardiomyopathy (Takotsubo's cardiomyopathy). Echo in the am. Continue amiodarone drip given VT and ectopy. Resume IV heparin 8 hours post sheath pull. IV Lasix tonight. Continue ASA. Will start a statin and low dose beta blocker.   ECHOCARDIOGRAM COMPLETE  Result Date: 04/24/2021    ECHOCARDIOGRAM REPORT   Patient Name:   Sheri Morgan Date of Exam: 04/24/2021 Medical Rec #:  [id number]      Height:       63.5 in Accession #:    [id number]     Weight:       202.8 lb Date of Birth:  1945-11-25     BSA:          1.957 m Patient Age:    76 years       BP:           98/61 mmHg Patient Gender: F              HR:           65 bpm. Exam Location:  Inpatient Procedure: 2D Echo Indications:    NSTEMI  History:        Patient has prior history of Echocardiogram examinations, most                 recent 07/14/2020. Arrythmias:Atrial Fibrillation; Risk                 Factors:Hypertension.  Sonographer:    Jefferey Pica Referring Phys: [place]  1. No left ventricular thrombus is seen. Left ventricular ejection fraction, by estimation, is 20 to 25%. The left ventricle has severely decreased function. The left ventricle demonstrates global hypokinesis. The left ventricular internal cavity size was mildly dilated. Left ventricular diastolic function could not  be evaluated.  2. Right ventricular systolic function is moderately reduced. The right ventricular size is mildly enlarged. There is moderately elevated pulmonary artery systolic pressure. The estimated right ventricular systolic pressure is 0000000 mmHg.  3. Left atrial size was severely dilated.  4. The mitral valve is normal in structure. Mild to moderate mitral valve regurgitation.  5. The aortic valve is tricuspid. There is mild thickening of the aortic valve. Aortic valve regurgitation is not  visualized. Aortic valve sclerosis is present, with no evidence of aortic valve stenosis.  6. The inferior vena cava is normal in size with greater than 50% respiratory variability, suggesting right atrial pressure of 3 mmHg. Comparison(s): Prior images reviewed side by side. The left ventricular function is worsened. FINDINGS  Left Ventricle: No left ventricular thrombus is seen. Left ventricular ejection fraction, by estimation, is 20 to 25%. The left ventricle has severely decreased function. The left ventricle demonstrates global hypokinesis. The left ventricular internal cavity size was mildly dilated. There is no left ventricular hypertrophy. Left ventricular diastolic function could not be evaluated due to atrial fibrillation. Left ventricular diastolic function could not be evaluated. Right Ventricle: The right ventricular size is mildly enlarged. No increase in right ventricular wall thickness. Right ventricular systolic function is moderately reduced. There is moderately elevated pulmonary artery systolic pressure. The tricuspid regurgitant velocity is 3.57 m/s, and with an assumed right atrial pressure of 3 mmHg, the estimated right ventricular systolic pressure is 0000000 mmHg. Left Atrium: Left atrial size was severely dilated. Right Atrium: Right atrial size was normal in size. Pericardium: There is no evidence of pericardial effusion. Mitral Valve: The mitral valve is normal in structure. Mild to moderate mitral valve regurgitation, with centrally-directed jet. Tricuspid Valve: The tricuspid valve is normal in structure. Tricuspid valve regurgitation is mild. Aortic Valve: The aortic valve is tricuspid. There is mild thickening of the aortic valve. Aortic valve regurgitation is not visualized. Aortic valve sclerosis is present, with no evidence of aortic valve stenosis. Aortic valve peak gradient measures 4.9  mmHg. Pulmonic Valve: The pulmonic valve was normal in structure. Pulmonic valve regurgitation  is mild. Aorta: The aortic root and ascending aorta are structurally normal, with no evidence of dilitation. Venous: The inferior vena cava is normal in size with greater than 50% respiratory variability, suggesting right atrial pressure of 3 mmHg. IAS/Shunts: No atrial level shunt detected by color flow Doppler.  LEFT VENTRICLE PLAX 2D LVIDd:         5.20 cm LVIDs:         4.90 cm LV PW:         1.20 cm LV IVS:        1.00 cm LVOT diam:     1.90 cm LV SV:         45 LV SV Index:   23 LVOT Area:     2.84 cm  LV Volumes (MOD) LV vol d, MOD A4C: 203.0 ml LV vol s, MOD A4C: 150.0 ml LV SV MOD A4C:     53.0 ml RIGHT VENTRICLE            IVC RV Basal diam:  3.20 cm    IVC diam: 1.80 cm RV S prime:     6.75 cm/s LEFT ATRIUM             Index        RIGHT ATRIUM           Index LA diam:  4.90 cm 2.50 cm/m   RA Area:     18.70 cm LA Vol (A2C):   77.3 ml 39.51 ml/m  RA Volume:   59.50 ml  30.41 ml/m LA Vol (A4C):   61.3 ml 31.33 ml/m LA Biplane Vol: 71.2 ml 36.39 ml/m  AORTIC VALVE                 PULMONIC VALVE AV Area (Vmax): 2.41 cm     PV Vmax:       0.55 m/s AV Vmax:        110.62 cm/s  PV Peak grad:  1.2 mmHg AV Peak Grad:   4.9 mmHg LVOT Vmax:      94.10 cm/s LVOT Vmean:     47.950 cm/s LVOT VTI:       0.159 m  AORTA Ao Root diam: 2.90 cm Ao Asc diam:  3.30 cm MITRAL VALVE               TRICUSPID VALVE MV Area (PHT): 3.68 cm    TR Peak grad:   51.0 mmHg MV Decel Time: 206 msec    TR Vmax:        357.00 cm/s MR Peak grad: 52.6 mmHg MR Vmax:      362.50 cm/s  SHUNTS MV E velocity: 79.50 cm/s  Systemic VTI:  0.16 m                            Systemic Diam: 1.90 cm Sanda Klein MD Electronically signed by Sanda Klein MD Signature Date/Time: 04/24/2021/12:34:40 PM    Final    CT Angio Chest/Abd/Pel for Dissection W and/or W/WO  Result Date: 04/23/2021 CLINICAL DATA:  Sudden onset chest pain with nausea. EXAM: CT ANGIOGRAPHY CHEST, ABDOMEN AND PELVIS TECHNIQUE: Non-contrast CT of the chest was  initially obtained. Multidetector CT imaging through the chest, abdomen and pelvis was performed using the standard protocol during bolus administration of intravenous contrast. Multiplanar reconstructed images and MIPs were obtained and reviewed to evaluate the vascular anatomy. RADIATION DOSE REDUCTION: This exam was performed according to the departmental dose-optimization program which includes automated exposure control, adjustment of the mA and/or kV according to patient size and/or use of iterative reconstruction technique. CONTRAST:  68mL OMNIPAQUE IOHEXOL 350 MG/ML SOLN COMPARISON:  PA and lateral chest series 12/18/2020. No prior cross-sectional imaging for comparison. FINDINGS: CTA CHEST FINDINGS Cardiovascular: There is mild-to-moderate panchamber cardiomegaly. There is thinning in the apical myocardium inferiorly consistent with an old infarct. Minimal calcification in the proximal LAD coronary artery with no other visible coronary artery calcifications. Pulmonary arteries are normal in caliber and centrally clear. Thoracic aorta is normal caliber, with minimal scattered noncalcified nonulcerative plaque in the descending segment. No calcified thoracic aortic plaque is seen. There is no dissection, with mild tortuosity in the descending segment noted. The great vessels opacified normally. There is a normal variant brachiobicarotid trunk. Both subclavian arteries are clear. There are no findings of acute right heart strain. There is distention of the superior pulmonary veins. Mediastinum/Nodes: Small hiatal hernia. There is moderate thickening of the distal thoracic esophagus warranting consideration of endoscopic follow-up. There few scattered borderline sized bilateral hilar lymph nodes. No intrathoracic enlarged lymph nodes are identified. There is 7.5 mm solid nodule in the inferior pole right lobe of the thyroid gland, otherwise unremarkable thyroid with unremarkable trachea. No imaging follow-up is  recommended. Lungs/Pleura: There is no pleural effusion, thickening or pneumothorax or  pulmonary edema. 9 Minimal reticular scarring is noted at the lung apices and scattered linear scarring or atelectasis in the bases. Faint mosaic attenuation in the lower lobes is seen most likely due to microatelectasis alongside areas of air trapping. No focal pneumonia is seen. Central airways are clear. There is a 5 mm noncalcified subpleural nodule in the right lower lobe on series 8 axial 73 and a 4 mm ground-glass noncalcified right lower lobe nodule on axial 93, without other appreciable nodules. Musculoskeletal: No chest wall abnormality. No acute or significant osseous findings. There is mild thoracic kyphosis, with multilevel degenerative disc disease with spondylosis in the thoracic spine Review of the MIP images confirms the above findings. CTA ABDOMEN AND PELVIS FINDINGS VASCULAR Aorta: Normal caliber aorta without aneurysm, dissection, vasculitis or significant stenosis. There are no appreciable plaques. Celiac: Patent without evidence of aneurysm, dissection, vasculitis or significant stenosis. SMA: Patent without evidence of aneurysm, dissection, vasculitis or significant stenosis. Renals: Both renal arteries are patent without evidence of aneurysm, dissection, vasculitis, fibromuscular dysplasia or significant stenosis. There is a single right renal artery and 2 left renal arteries arising in close tandem. No visible plaques. IMA: Patent without evidence of aneurysm, dissection, vasculitis or significant stenosis. Inflow: Patent without evidence of aneurysm, dissection, vasculitis or significant stenosis. There is mild scattered nonstenosing calcific plaque in the common iliac arteries and in both proximal to mid internal iliac arteries. There is a diminutively small and severely stenotic right external iliac arteries throughout its length. The left external iliac artery and both common femoral arteries, and both  visualized proximal superficial femoral arteries are patent. Clinical correlation for right lower extremity claudication is recommended. The visualized circumflex and deep femoral arteries also show flow. Veins: No obvious venous abnormality within the limitations of this arterial phase study. The veins are unopacified. There are numerous pelvic venous phleboliths. Review of the MIP images confirms the above findings. NON-VASCULAR Hepatobiliary: No focal liver abnormality is seen. No gallstones, gallbladder wall thickening, or biliary dilatation. Pancreas: No mass enhancement , ductal dilatation or peripancreatic edema. There is a 9 mm lipoma of -70 Hounsfield units in the pancreatic head. Spleen: No mass enhancement or splenomegaly. The spleen is somewhat small in size for the size of the patient. Adrenals/Urinary Tract: There is no adrenal or renal cortical mass, no evidence of urinary stones or obstruction. There is a 1 cm gonadal vein phlebolith on the left anterior to the common iliac vessel bifurcation. Stomach/Bowel: Unremarkable stomach and unopacified small bowel. Normal caliber appendix. Diffuse colonic diverticulosis is seen. There is wall prominence versus nondistention in the transverse and proximal descending colon. Advanced sigmoid diverticula noted without evidence of acute diverticulitis. Lymphatic: There are no enlarged abdominopelvic lymph nodes. Reproductive: Status post hysterectomy. No adnexal masses. Other: Small umbilical fat hernia. There is no free air, hemorrhage or fluid. Musculoskeletal: Advanced degenerative disc disease L5-S1. No worrisome regional bone lesion. Moderate spurring of the pubic symphysis. There are mild features of chronic sacroiliitis. Advanced L4-5 facet hypertrophy. Review of the MIP images confirms the above findings. IMPRESSION: 1. No aortic dissection. In the thoracic aorta there is mild descending segment tortuosity, with minimal scattered noncalcified  nonulcerative plaque. There is no focal abnormality in the abdominal aorta. 2. 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 No pericardial effusion. 3. Normal caliber centrally clear pulmonary arteries with mildly distended pulmonary veins. No pulmonary edema or pleural effusion , no active pneumonia. 4.  Mosaic attenuation in the lower lobes, most likely due to microatelectasis alongside areas of air trapping. Central airways are clear. 5. 4 mm and 5 mm right lower lobe noncalcified nodules. Recommend a noncontrast chest CT at 3-6 months. If stable, consider follow-up noncontrast chest CTs at 2 and 4 years. These guidelines do not apply to immunocompromised patients and patients with cancer. F/U in patients with significant comorbidities as clinically warranted. For lung cancer screening, adhere to Lung-RADS guidelines. Reference: Radiology. 2017; 284 (1): 228-43. 6. Hiatal hernia with moderately thickened distal thoracic esophagus. Consider endoscopic follow-up. 7. Mild calcification of the common iliac and internal iliac arteries, with diminutively small and severely stenotic right external iliac artery and normal caliber left external iliac artery, with flow preserved in the common femoral and proximal superficial femoral arteries. Correlate clinically for right lower extremity claudication. 8. No abdominal visceral artery stenosis. 9. 9 mm lipoma in the head of pancreas. 10. Diffuse colonic diverticula with colitis versus nondistention in the transverse and proximal descending colon. 11. Umbilical fat hernia additional findings described above. Electronically Signed   By: Telford Nab M.D.   On: 04/23/2021 20:34    Cardiac Studies   Echo from yesterday reveiwed. EF 15%. Global hypokinesis.   Assessment & Plan    75yo woman admitted with elevated Tn, worsenin LV function, hypotension, N/V. Cath without  significant CAD. Tele with nonsustained PMVT that appear brady-mediated.  #NSVT Amio started Medical therapy limited by significant hypotension  #Acute systolic heart failure BP currently in the 60s-70s. Bradycardic She is currently suffering from poor chronotropy and inotropy. ?Giant cell vs Takosubo  She will require central access. Once central access obtained, start levophed.  I have discussed her case with Dr Haroldine Laws from HF. Will plan to get central access to assess CO/CI and to potentially infuse isuprel. She may require temp pacing support depending on electrical stability.   CRITICAL CARE Performed by: Vickie Epley   Total critical care time: 50 minutes  Critical care time was exclusive of separately billable procedures and treating other patients.  Critical care was necessary to treat or prevent imminent or life-threatening deterioration.  Critical care was time spent personally by me on the following activities: development of treatment plan with patient and/or surrogate as well as nursing, discussions with consultants, evaluation of patient's response to treatment, examination of patient, obtaining history from patient or surrogate, ordering and performing treatments and interventions, ordering and review of laboratory studies, ordering and review of radiographic studies, pulse oximetry and re-evaluation of patient's condition.   For questions or updates, please contact [place] Please consult [email or web address] for contact info under        Signed, Vickie Epley, MD  04/25/2021, 8:43 AM

## 2021-04-25 NOTE — Progress Notes (Signed)
Patient restless and anxious, describing feelings of pending doom, diaphoretic, SpO2 declining to 84, HR increasing to 75. Nasal cannula applied at 2 LPM. Sublingual nitroglycerin administered x2. MD paged. O2 increased to 4 LPM. Dobutamine titration attempted with no change in symptoms. ECG obtained.

## 2021-04-25 NOTE — Consult Note (Signed)
Advanced Heart Failure Team Consult Note   Primary Physician: Janith Lima, MD PCP-Cardiologist:  Elouise Munroe, MD  Reason for Consultation: Cardiogenic shock  HPI:    Sheri Morgan is seen today for evaluation of cardiogenic shock at the request of Dr. Quentin Ore.   76 y/o obese woman with fibromyalgia, PAF, PVCs, HTN and systolic HF  Previously followed by Dr. Donata Clay in [place] Dr. Delsa Bern in 2/22 with recurrent AF. Echo 5/22 with newly discovered LV dysfunction EF 30-35%  No f/u here since that time  Admitted 2/9 with CP and worsening SOB. Labs notable for troponin 204 -> 379 and BNP 379.1. EKG showed sinus tach with sub-1 mm STE in I and aVL and sinus tachycardia. CT dissection protocol was negative for dissection.  Cath 2/10 with minimal CAD EF 20-25%. ? Possible takotsubo.  Echo 20-25% moderate MR. On way to cath developed VT. Started on amiodarone developed more bradycardia -> PMVT   This am remains hypotensive. Still with episodes of NSVT with long-short phenomenon.    Review of Systems: [y] = yes, [ ]  = no   General: Weight gain [ ] ; Weight loss [ ] ; Anorexia [ ] ; Fatigue [ y]; Fever [ ] ; Chills [ ] ; Weakness [ ]   Cardiac: Chest pain/pressure [ ] ; Resting SOB [ y]; Exertional SOB [ y]; Orthopnea [ ] ; Pedal Edema [ ] ; Palpitations [ ] ; Syncope [ ] ; Presyncope [ ] ; Paroxysmal nocturnal dyspnea[ ]   Pulmonary: Cough [ ] ; Wheezing[ ] ; Hemoptysis[ ] ; Sputum [ ] ; Snoring [ ]   GI: Vomiting[ ] ; Dysphagia[ ] ; Melena[ ] ; Hematochezia [ ] ; Heartburn[ ] ; Abdominal pain [ ] ; Constipation [ ] ; Diarrhea [ ] ; BRBPR [ ]   GU: Hematuria[ ] ; Dysuria [ ] ; Nocturia[ ]   Vascular: Pain in legs with walking [ ] ; Pain in feet with lying flat [ ] ; Non-healing sores [ ] ; Stroke [ ] ; TIA [ ] ; Slurred speech [ ] ;  Neuro: Headaches[ ] ; Vertigo[ ] ; Seizures[ ] ; Paresthesias[ ] ;Blurred vision [ ] ; Diplopia [ ] ; Vision changes [ ]   Ortho/Skin: Arthritis Blue.Reese ]; Joint pain Blue.Reese ]; Muscle pain Blue.Reese ];  Joint swelling [ ] ; Back Pain [ ] ; Rash [ ]   Psych: Depression[ ] ; Anxiety[ y]  Heme: Bleeding problems [ ] ; Clotting disorders [ ] ; Anemia [ ]   Endocrine: Diabetes [ ] ; Thyroid dysfunction[ ]   Home Medications Prior to Admission medications   Medication Sig Start Date End Date Taking? Authorizing Provider  hydrochlorothiazide (HYDRODIURIL) 25 MG tablet Take 1 tablet (25 mg total) by mouth daily. 12/18/20  Yes Pearson Forster, NP  levothyroxine (SYNTHROID) 200 MCG tablet Take 1 tablet (200 mcg total) by mouth daily before breakfast. 12/18/20  Yes Pearson Forster, NP  prednisoLONE acetate (PRED FORTE) 1 % ophthalmic suspension Place 1 drop into the left eye every evening. 03/18/21  Yes [provider]  apixaban (ELIQUIS) 5 MG TABS tablet Take 1 tablet (5 mg total) by mouth 2 (two) times daily. Patient not taking: Reported on 11/12/2020 05/09/20   Elouise Munroe, MD  gabapentin (NEURONTIN) 300 MG capsule Take 1 capsule (300 mg total) by mouth 3 (three) times daily as needed. Patient not taking: Reported on 11/12/2020 11/12/19   Volney American, PA-C  levothyroxine (SYNTHROID) 25 MCG tablet Take 25 mcg by mouth daily before breakfast.    [provider]  LORazepam (ATIVAN) 0.5 MG tablet Take 0.5 mg by mouth every 8 (eight) hours. Patient not taking: Reported on 11/12/2020  [provider]  predniSONE (STERAPRED UNI-PAK 21 TAB) 10 MG (21) TBPK tablet Take by mouth daily. Take 6 tabs by mouth daily  for 2 days, then 5 tabs for 2 days, then 4 tabs for 2 days, then 3 tabs for 2 days, 2 tabs for 2 days, then 1 tab by mouth daily for 2 days Patient not taking: Reported on 04/23/2021 12/18/20   Pearson Forster, NP  rosuvastatin (CRESTOR) 10 MG tablet Take 1 tablet (10 mg total) by mouth daily. Patient not taking: Reported on 11/12/2020 02/11/20   Janith Lima, MD    Past Medical History: Past Medical History:  Diagnosis Date   Anxiety    Atypical atrial flutter (HCC)     Fibromyalgia    Hypertension    Paroxysmal atrial fibrillation (HCC)    Premature atrial contractions    Premature ventricular contraction     Past Surgical History: Past Surgical History:  Procedure Laterality Date   ABDOMINAL HYSTERECTOMY     LEFT HEART CATH AND CORONARY ANGIOGRAPHY N/A 04/23/2021   Procedure: LEFT HEART CATH AND CORONARY ANGIOGRAPHY;  Surgeon: Burnell Blanks, MD;  Location: [facility];  Service: Cardiovascular;  Laterality: N/A;   LUMBAR LAMINECTOMY     THYROIDECTOMY      Family History: Family History  Problem Relation Age of Onset   Diabetes Mother    Alcoholism Father     Social History: Social History   Socioeconomic History   Marital status: Divorced    Spouse name: Not on file   Number of children: Not on file   Years of education: Not on file   Highest education level: Not on file  Occupational History   Not on file  Tobacco Use   Smoking status: Never   Smokeless tobacco: Never  Vaping Use   Vaping Use: Never used  Substance and Sexual Activity   Alcohol use: No   Drug use: No   Sexual activity: Never  Other Topics Concern   Not on file  Social History Narrative   Retired from [employer]   Social Determinants of Radio broadcast assistant Strain: Low Risk    Difficulty of Paying Living Expenses: Not hard at all  Food Insecurity: No Food Insecurity   Worried About Charity fundraiser in the Last Year: Never true   Arboriculturist in the Last Year: Never true  Transportation Needs: No Transportation Needs   Lack of Transportation (Medical): No   Lack of Transportation (Non-Medical): No  Physical Activity: Insufficiently Active   Days of Exercise per Week: 1 day   Minutes of Exercise per Session: 30 min  Stress: No Stress Concern Present   Feeling of Stress : Not at all  Social Connections: Moderately Integrated   Frequency of Communication with Friends and Family: More than three times a week   Frequency of  Social Gatherings with Friends and Family: Not on file   Attends Religious Services: More than 4 times per year   Active Member of [employer] or Organizations: Yes   Attends Archivist Meetings: Never   Marital Status: Divorced    Allergies:  Allergies  Allergen Reactions   Codeine Itching    Objective:    Vital Signs:   Temp:  [97.6 F (36.4 C)-98.4 F (36.9 C)] 97.8 F (36.6 C) (02/11 0821) Pulse Rate:  [46-67] 59 (02/11 0500) Resp:  [12-25] 14 (02/11 0500) BP: (76-121)/(33-95) 82/61 (02/11 0500) SpO2:  [91 %-  96 %] 91 % (02/11 0500) Last BM Date: 04/23/21  Weight change: Filed Weights   04/23/21 1900 04/24/21 0015  Weight: 95.3 kg 92 kg    Intake/Output:   Intake/Output Summary (Last 24 hours) at 04/25/2021 0913 Last data filed at 04/25/2021 0700 Gross per 24 hour  Intake 470 ml  Output 1250 ml  Net -780 ml      Physical Exam    General:  Lying in bed No resp difficulty HEENT: normal Neck: supple. JVP 7 . Carotids 2+ bilat; no bruits. No lymphadenopathy or thyromegaly appreciated. Cor: PMI nondisplaced. Irregular rate & rhythm. Soft MR Lungs: clear Abdomen: soft, nontender, nondistended. No hepatosplenomegaly. No bruits or masses. Good bowel sounds. Extremities: no cyanosis, clubbing, rash, edema Neuro: alert & orientedx3, cranial nerves grossly intact. moves all 4 extremities w/o difficulty. Affect flat   Telemetry   Sinus with frequent PACs/PVCs + NSVT/PMVT  EKG    Per HPI   Labs   Basic Metabolic Panel: Recent Labs  Lab 04/23/21 1847 04/23/21 1909 04/23/21 2144 04/24/21 0249 04/24/21 1913  NA 139 140  --  136 137  K 3.4* 3.5  --  4.1 3.9  CL 103 103  --  100 95*  CO2 25  --   --  22 31  GLUCOSE 134* 138*  --  132* 117*  BUN 15 16  --  14 13  CREATININE 1.41* 1.30*  --  1.23* 1.28*  CALCIUM 9.0  --   --  9.2 9.4  MG  --   --  2.1 2.0  --     Liver Function Tests: Recent Labs  Lab 04/23/21 2144  AST 58*  ALT 24  ALKPHOS  90  BILITOT 0.6  PROT 6.8  ALBUMIN 3.7   No results for input(s): LIPASE, AMYLASE in the last 168 hours. No results for input(s): AMMONIA in the last 168 hours.  CBC: Recent Labs  Lab 04/23/21 1847 04/23/21 1909 04/24/21 0249 04/25/21 0549  WBC 7.0  --  7.3 6.5  HGB 12.7 15.0 13.8 12.3  HCT 39.7 44.0 41.8 38.3  MCV 92.5  --  90.3 93.0  PLT 180  --  144* 147*    Cardiac Enzymes: No results for input(s): CKTOTAL, CKMB, CKMBINDEX, TROPONINI in the last 168 hours.  BNP: BNP (last 3 results) Recent Labs    04/23/21 2144  BNP 379.1*    ProBNP (last 3 results) No results for input(s): PROBNP in the last 8760 hours.   CBG: Recent Labs  Lab 04/23/21 2351  GLUCAP 142*    Coagulation Studies: Recent Labs    04/23/21 2144  LABPROT 12.7  INR 1.0     Imaging   ECHOCARDIOGRAM COMPLETE  Result Date: 04/24/2021    ECHOCARDIOGRAM REPORT   Patient Name:   QUENTIN ABRAMOWICZ Date of Exam: 04/24/2021 Medical Rec #:  [id number]      Height:       63.5 in Accession #:    [id number]     Weight:       202.8 lb Date of Birth:  15-Jan-1946     BSA:          1.957 m Patient Age:    54 years       BP:           98/61 mmHg Patient Gender: F              HR:  65 bpm. Exam Location:  Inpatient Procedure: 2D Echo Indications:    NSTEMI  History:        Patient has prior history of Echocardiogram examinations, most                 recent 07/14/2020. Arrythmias:Atrial Fibrillation; Risk                 Factors:Hypertension.  Sonographer:    Jefferey Pica Referring Phys: Guys  1. No left ventricular thrombus is seen. Left ventricular ejection fraction, by estimation, is 20 to 25%. The left ventricle has severely decreased function. The left ventricle demonstrates global hypokinesis. The left ventricular internal cavity size was mildly dilated. Left ventricular diastolic function could not be evaluated.  2. Right ventricular systolic function is moderately  reduced. The right ventricular size is mildly enlarged. There is moderately elevated pulmonary artery systolic pressure. The estimated right ventricular systolic pressure is 0000000 mmHg.  3. Left atrial size was severely dilated.  4. The mitral valve is normal in structure. Mild to moderate mitral valve regurgitation.  5. The aortic valve is tricuspid. There is mild thickening of the aortic valve. Aortic valve regurgitation is not visualized. Aortic valve sclerosis is present, with no evidence of aortic valve stenosis.  6. The inferior vena cava is normal in size with greater than 50% respiratory variability, suggesting right atrial pressure of 3 mmHg. Comparison(s): Prior images reviewed side by side. The left ventricular function is worsened. FINDINGS  Left Ventricle: No left ventricular thrombus is seen. Left ventricular ejection fraction, by estimation, is 20 to 25%. The left ventricle has severely decreased function. The left ventricle demonstrates global hypokinesis. The left ventricular internal cavity size was mildly dilated. There is no left ventricular hypertrophy. Left ventricular diastolic function could not be evaluated due to atrial fibrillation. Left ventricular diastolic function could not be evaluated. Right Ventricle: The right ventricular size is mildly enlarged. No increase in right ventricular wall thickness. Right ventricular systolic function is moderately reduced. There is moderately elevated pulmonary artery systolic pressure. The tricuspid regurgitant velocity is 3.57 m/s, and with an assumed right atrial pressure of 3 mmHg, the estimated right ventricular systolic pressure is 0000000 mmHg. Left Atrium: Left atrial size was severely dilated. Right Atrium: Right atrial size was normal in size. Pericardium: There is no evidence of pericardial effusion. Mitral Valve: The mitral valve is normal in structure. Mild to moderate mitral valve regurgitation, with centrally-directed jet. Tricuspid Valve:  The tricuspid valve is normal in structure. Tricuspid valve regurgitation is mild. Aortic Valve: The aortic valve is tricuspid. There is mild thickening of the aortic valve. Aortic valve regurgitation is not visualized. Aortic valve sclerosis is present, with no evidence of aortic valve stenosis. Aortic valve peak gradient measures 4.9  mmHg. Pulmonic Valve: The pulmonic valve was normal in structure. Pulmonic valve regurgitation is mild. Aorta: The aortic root and ascending aorta are structurally normal, with no evidence of dilitation. Venous: The inferior vena cava is normal in size with greater than 50% respiratory variability, suggesting right atrial pressure of 3 mmHg. IAS/Shunts: No atrial level shunt detected by color flow Doppler.  LEFT VENTRICLE PLAX 2D LVIDd:         5.20 cm LVIDs:         4.90 cm LV PW:         1.20 cm LV IVS:        1.00 cm LVOT diam:     1.90 cm  LV SV:         45 LV SV Index:   23 LVOT Area:     2.84 cm  LV Volumes (MOD) LV vol d, MOD A4C: 203.0 ml LV vol s, MOD A4C: 150.0 ml LV SV MOD A4C:     53.0 ml RIGHT VENTRICLE            IVC RV Basal diam:  3.20 cm    IVC diam: 1.80 cm RV S prime:     6.75 cm/s LEFT ATRIUM             Index        RIGHT ATRIUM           Index LA diam:        4.90 cm 2.50 cm/m   RA Area:     18.70 cm LA Vol (A2C):   77.3 ml 39.51 ml/m  RA Volume:   59.50 ml  30.41 ml/m LA Vol (A4C):   61.3 ml 31.33 ml/m LA Biplane Vol: 71.2 ml 36.39 ml/m  AORTIC VALVE                 PULMONIC VALVE AV Area (Vmax): 2.41 cm     PV Vmax:       0.55 m/s AV Vmax:        110.62 cm/s  PV Peak grad:  1.2 mmHg AV Peak Grad:   4.9 mmHg LVOT Vmax:      94.10 cm/s LVOT Vmean:     47.950 cm/s LVOT VTI:       0.159 m  AORTA Ao Root diam: 2.90 cm Ao Asc diam:  3.30 cm MITRAL VALVE               TRICUSPID VALVE MV Area (PHT): 3.68 cm    TR Peak grad:   51.0 mmHg MV Decel Time: 206 msec    TR Vmax:        357.00 cm/s MR Peak grad: 52.6 mmHg MR Vmax:      362.50 cm/s  SHUNTS MV E  velocity: 79.50 cm/s  Systemic VTI:  0.16 m                            Systemic Diam: 1.90 cm Mihai Croitoru MD Electronically signed by Sanda Klein MD Signature Date/Time: 04/24/2021/12:34:40 PM    Final      Medications:     Current Medications:  apixaban  5 mg Oral BID   aspirin EC  81 mg Oral Daily   Chlorhexidine Gluconate Cloth  6 each Topical Daily   dapagliflozin propanediol  10 mg Oral Daily   furosemide  40 mg Intravenous BID   levothyroxine  200 mcg Oral QAC breakfast   mouth rinse  15 mL Mouth Rinse BID   pantoprazole  40 mg Oral Daily   rosuvastatin  40 mg Oral Daily   senna-docusate  2 tablet Oral BID   sodium chloride flush  3 mL Intravenous Q12H    Infusions:  sodium chloride     sodium chloride     norepinephrine (LEVOPHED) Adult infusion       Assessment/Plan   1.  Acute on chronic systolic HF due to NICM -> cardiogenic shock - 5/22 ECHO EF 30-35% - Cath 2/23 minimal CAD. hstroponin 204 -> 379 - Echo 2/23: EF 20-25% - now appears to be in shock with hypotension - needs central access and inotrope  support - will need cMRI - echo does not look like Tako-tsubo despite presentation. With ectopy could be Giant Cell but time course and only mild hsTn elevation doesn't fit. Has h/o PVCs - suspect this may be PVC mediated. - Start dobutamine - Once PICC in follow CVP and co-ox   2. PMVT - EP following. I d/w Dr. Lurline Hare - will start lidocaine as well as dobutamine. Can consider TV pacing to prevent bradycardia triggers - Keep K> 4.0 Mg > 2.0   3. Paroxysmal AF - hold apixaban - flip to heparin  CRITICAL CARE Performed by: Glori Bickers  Total critical care time: 45 minutes  Critical care time was exclusive of separately billable procedures and treating other patients.  Critical care was necessary to treat or prevent imminent or life-threatening deterioration.  Critical care was time spent personally by me (independent of midlevel providers or  residents) on the following activities: development of treatment plan with patient and/or surrogate as well as nursing, discussions with consultants, evaluation of patient's response to treatment, examination of patient, obtaining history from patient or surrogate, ordering and performing treatments and interventions, ordering and review of laboratory studies, ordering and review of radiographic studies, pulse oximetry and re-evaluation of patient's condition.  Length of Stay: 2  Glori Bickers, MD  04/25/2021, 9:13 AM  Advanced Heart Failure Team Pager [phone] (M-F; 7a - 5p)  Please contact [facility] for night-coverage after hours (4p -7a ) and weekends on [email or web address]

## 2021-04-25 NOTE — Progress Notes (Signed)
Patient noted to be in second degree type 1, Wenkebach with a HR of 29. Patient resting. HR recovered to 59 (consistent with baseline) once stimulated. Patient denies feelings of chest pain or shortness of breath noted. BP: 72/46 (54) HR 59 rhythm remains AV block second degree type 1. RR 14. Rhythm strip printed. Dr. Jacinto Halim, cardiology, notified. No new orders at this time. Patient resting in bed.

## 2021-04-25 NOTE — Progress Notes (Signed)
Peripherally Inserted Central Catheter Placement  The IV Nurse has discussed with the patient and/or persons authorized to consent for the patient, the purpose of this procedure and the potential benefits and risks involved with this procedure.  The benefits include less needle sticks, lab draws from the catheter, and the patient may be discharged home with the catheter. Risks include, but not limited to, infection, bleeding, blood clot (thrombus formation), and puncture of an artery; nerve damage and irregular heartbeat and possibility to perform a PICC exchange if needed/ordered by physician.  Alternatives to this procedure were also discussed.  Bard Power PICC patient education guide, fact sheet on infection prevention and patient information card has been provided to patient /or left at bedside.    PICC Placement Documentation  PICC Double Lumen [id number] PICC Right Basilic 39 cm 0 cm (Active)  Indication for Insertion or Continuance of Line Vasoactive infusions 04/25/21 1121  Exposed Catheter (cm) 0 cm 04/25/21 1121  Site Assessment Clean;Intact;Dry 04/25/21 1121  Lumen #1 Status Flushed;Saline locked;Blood return noted 04/25/21 1121  Lumen #2 Status Flushed;Saline locked;Blood return noted 04/25/21 1121  Dressing Type Transparent 04/25/21 1121  Dressing Status Clean;Dry;Intact 04/25/21 1121  Antimicrobial disc in place? Yes 04/25/21 1121  Safety Lock Not Applicable [id number]  Line Care Connections checked and tightened 04/25/21 1121  Line Adjustment (NICU/IV Team Only) No 04/25/21 1121  Dressing Intervention New dressing 04/25/21 1121  Dressing Change Due 05/02/21 04/25/21 1121       Rolena Infante 04/25/2021, 11:22 AM

## 2021-04-25 NOTE — Progress Notes (Signed)
ANTICOAGULATION CONSULT NOTE  Pharmacy Consult for heparin Indication: atrial fibrillation  Allergies  Allergen Reactions   Codeine Itching    Patient Measurements: Height: 5' 3.5" (161.3 cm) Weight: 92 kg (202 lb 13.2 oz) IBW/kg (Calculated) : 53.56 Heparin Dosing Weight: 75kg  Vital Signs: Temp: 97.8 F (36.6 C) (02/11 0821) Temp Source: Oral (02/11 0821) BP: 82/61 (02/11 0500) Pulse Rate: 59 (02/11 0500)  Labs: Recent Labs    04/23/21 1847 04/23/21 1909 04/23/21 2144 04/23/21 2146 04/24/21 0249 04/24/21 1913 04/25/21 0549  HGB 12.7 15.0  --   --  13.8  --  12.3  HCT 39.7 44.0  --   --  41.8  --  38.3  PLT 180  --   --   --  144*  --  147*  APTT  --   --  24  --   --   --   --   LABPROT  --   --  12.7  --   --   --   --   INR  --   --  1.0  --   --   --   --   HEPARINUNFRC  --   --   --  <0.10*  --   --   --   CREATININE 1.41* 1.30*  --   --  1.23* 1.28*  --   TROPONINIHS 204*  --  379*  --   --   --   --     Estimated Creatinine Clearance (by C-G formula based on SCr of 1.28 mg/dL (H)) Female: 06.2 mL/min (A) Female: 49 mL/min (A)   Medical History: Past Medical History:  Diagnosis Date   Anxiety    Atypical atrial flutter (HCC)    Fibromyalgia    Hypertension    Paroxysmal atrial fibrillation (HCC)    Premature atrial contractions    Premature ventricular contraction      Assessment: 72 yoF admitted with SOB and possible ACS. Pt s/p LHC with nonobstructive CAD. Pt previously on apixaban for AF with noted noncompliance, resumed post/cath. Pt now with worsening shock, will transition to IV heparin for procedures. Last dose of apixaban was this morning.  Goal of Therapy:  Heparin level 0.3-0.7 units/ml aPTT 66-102 seconds Monitor platelets by anticoagulation protocol: Yes   Plan:  Heparin 1200 units/h no bolus tonight at 2100 Check aPTT and heparin level with am labs  Fredonia Highland, PharmD, BCPS, [facility] Clinical Pharmacist [phone] Please  check AMION for all [facility] numbers 04/25/2021

## 2021-04-26 DIAGNOSIS — I5023 Acute on chronic systolic (congestive) heart failure: Secondary | ICD-10-CM | POA: Diagnosis not present

## 2021-04-26 DIAGNOSIS — I472 Ventricular tachycardia, unspecified: Secondary | ICD-10-CM | POA: Diagnosis not present

## 2021-04-26 DIAGNOSIS — R570 Cardiogenic shock: Secondary | ICD-10-CM | POA: Diagnosis not present

## 2021-04-26 LAB — CBC
HCT: 38.3 % (ref 36.0–46.0)
Hemoglobin: 12.4 g/dL (ref 12.0–15.0)
MCH: 29.5 pg (ref 26.0–34.0)
MCHC: 32.4 g/dL (ref 30.0–36.0)
MCV: 91 fL (ref 80.0–100.0)
Platelets: 142 10*3/uL — ABNORMAL LOW (ref 150–400)
RBC: 4.21 MIL/uL (ref 3.87–5.11)
RDW: 16.1 % — ABNORMAL HIGH (ref 11.5–15.5)
WBC: 8.6 10*3/uL (ref 4.0–10.5)
nRBC: 0 % (ref 0.0–0.2)

## 2021-04-26 LAB — COOXEMETRY PANEL
Carboxyhemoglobin: 1 % (ref 0.5–1.5)
Methemoglobin: 1 % (ref 0.0–1.5)
O2 Saturation: 68.7 %
Total hemoglobin: 13.1 g/dL (ref 12.0–16.0)

## 2021-04-26 LAB — BASIC METABOLIC PANEL
Anion gap: 13 (ref 5–15)
BUN: 15 mg/dL (ref 8–23)
CO2: 30 mmol/L (ref 22–32)
Calcium: 8.8 mg/dL — ABNORMAL LOW (ref 8.9–10.3)
Chloride: 93 mmol/L — ABNORMAL LOW (ref 98–111)
Creatinine, Ser: 1.43 mg/dL — ABNORMAL HIGH (ref 0.44–1.00)
GFR, Estimated: 38 mL/min — ABNORMAL LOW (ref >60)
Glucose, Bld: 171 mg/dL — ABNORMAL HIGH (ref 70–99)
Potassium: 3.2 mmol/L — ABNORMAL LOW (ref 3.5–5.1)
Sodium: 136 mmol/L (ref 135–145)

## 2021-04-26 LAB — MAGNESIUM
Magnesium: 1.8 mg/dL (ref 1.7–2.4)
Magnesium: 1.9 mg/dL (ref 1.7–2.4)

## 2021-04-26 LAB — GLUCOSE, CAPILLARY
Glucose-Capillary: 120 mg/dL — ABNORMAL HIGH (ref 70–99)
Glucose-Capillary: 122 mg/dL — ABNORMAL HIGH (ref 70–99)
Glucose-Capillary: 155 mg/dL — ABNORMAL HIGH (ref 70–99)

## 2021-04-26 LAB — HEPARIN LEVEL (UNFRACTIONATED): Heparin Unfractionated: >1.1 IU/mL — ABNORMAL HIGH (ref 0.30–0.70)

## 2021-04-26 LAB — APTT: aPTT: 105 seconds — ABNORMAL HIGH (ref 24–36)

## 2021-04-26 LAB — LACTIC ACID, PLASMA: Lactic Acid, Venous: 1.2 mmol/L (ref 0.5–1.9)

## 2021-04-26 MED ORDER — POTASSIUM CHLORIDE 10 MEQ/100ML IV SOLN
10.0000 meq | INTRAVENOUS | Status: AC
  Administered 2021-04-26 (×3): 10 meq via INTRAVENOUS
  Filled 2021-04-26 (×3): qty 100

## 2021-04-26 MED ORDER — SODIUM CHLORIDE 0.9 % IV SOLN
500.0000 mg | Freq: Every day | INTRAVENOUS | Status: AC
  Administered 2021-04-26 – 2021-04-28 (×3): 500 mg via INTRAVENOUS
  Filled 2021-04-26: qty 4
  Filled 2021-04-26 (×2): qty 8

## 2021-04-26 MED ORDER — NOREPINEPHRINE 4 MG/250ML-% IV SOLN: 0.0000 ug/min | INTRAVENOUS | Status: DC

## 2021-04-26 MED ORDER — POTASSIUM CHLORIDE 10 MEQ/50ML IV SOLN
10.0000 meq | INTRAVENOUS | Status: AC
  Administered 2021-04-26 (×3): 10 meq via INTRAVENOUS

## 2021-04-26 MED ORDER — MEXILETINE HCL 200 MG PO CAPS
200.0000 mg | ORAL_CAPSULE | Freq: Two times a day (BID) | ORAL | Status: DC
  Administered 2021-04-26 – 2021-05-04 (×18): 200 mg via ORAL
  Filled 2021-04-26 (×20): qty 1

## 2021-04-26 MED ORDER — POTASSIUM CHLORIDE CRYS ER 20 MEQ PO TBCR
40.0000 meq | EXTENDED_RELEASE_TABLET | Freq: Once | ORAL | Status: DC
  Filled 2021-04-26: qty 2

## 2021-04-26 MED ORDER — POTASSIUM CHLORIDE 10 MEQ/50ML IV SOLN
10.0000 meq | INTRAVENOUS | Status: DC
  Filled 2021-04-26 (×6): qty 50

## 2021-04-26 MED ORDER — INSULIN ASPART 100 UNIT/ML IJ SOLN
0.0000 [IU] | Freq: Three times a day (TID) | INTRAMUSCULAR | Status: DC
  Administered 2021-04-26 – 2021-05-05 (×10): 1 [IU] via SUBCUTANEOUS
  Administered 2021-05-07 – 2021-05-08 (×2): 2 [IU] via SUBCUTANEOUS
  Administered 2021-05-09 – 2021-05-10 (×2): 1 [IU] via SUBCUTANEOUS

## 2021-04-26 MED ORDER — POTASSIUM CHLORIDE IN NACL 40-0.9 MEQ/L-% IV SOLN
INTRAVENOUS | Status: AC
  Filled 2021-04-26: qty 1000

## 2021-04-26 MED ORDER — MAGNESIUM SULFATE 2 GM/50ML IV SOLN
2.0000 g | Freq: Once | INTRAVENOUS | Status: AC
  Administered 2021-04-26: 2 g via INTRAVENOUS
  Filled 2021-04-26: qty 50

## 2021-04-26 NOTE — Progress Notes (Addendum)
Progress Note  Patient Name: Sheri Morgan Date of Encounter: 04/26/2021  [facility] Cardiologist: Elouise Munroe, MD   Subjective   Nauseous and vomiting this AM. On toilet.  Inpatient Medications    Scheduled Meds:  aspirin EC  81 mg Oral Daily   Chlorhexidine Gluconate Cloth  6 each Topical Daily   dapagliflozin propanediol  10 mg Oral Daily   furosemide  40 mg Intravenous BID   levothyroxine  200 mcg Oral QAC breakfast   mouth rinse  15 mL Mouth Rinse BID   pantoprazole  40 mg Oral Daily   potassium chloride  40 mEq Oral Once   rosuvastatin  40 mg Oral Daily   senna-docusate  2 tablet Oral BID   sodium chloride flush  10-40 mL Intracatheter Q12H   sodium chloride flush  3 mL Intravenous Q12H   Continuous Infusions:  sodium chloride     sodium chloride Stopped (04/25/21 1526)   DOBUTamine 2.5 mcg/kg/min (04/26/21 0600)   heparin 1,200 Units/hr (04/26/21 0600)   lidocaine 1 mg/min (04/26/21 0600)   magnesium sulfate bolus IVPB     norepinephrine (LEVOPHED) Adult infusion Stopped (04/25/21 1149)   PRN Meds: sodium chloride, acetaminophen, lidocaine (PF), nitroGLYCERIN, ondansetron (ZOFRAN) IV, sodium chloride flush, sodium chloride flush   Vital Signs    Vitals:   04/26/21 0700 04/26/21 0730 04/26/21 0800 04/26/21 0830  BP: 93/68 (!) 77/61 107/66 103/84  Pulse: 64 (!) 57 78 (!) 57  Resp: 18 15 (!) 21 (!) 23  Temp:      TempSrc:      SpO2: 96% 94% 95% 91%  Weight:      Height:        Intake/Output Summary (Last 24 hours) at 04/26/2021 0917 Last data filed at 04/26/2021 0600 Gross per 24 hour  Intake 685.86 ml  Output 1350 ml  Net -664.14 ml    Last 3 Weights 04/24/2021 04/23/2021 06/11/2020  Weight (lbs) 202 lb 13.2 oz 210 lb 1.6 oz 213 lb 12.8 oz  Weight (kg) 92 kg 95.3 kg 96.979 kg      Telemetry    Sinus bradycardia with frequent PVCs. Salvos of NSVT (polymorphic) triggered by short-long-short sequences. This continue this AM. - Personally  Reviewed  ECG    Sinus bradycardia with frequent polymorphic PVCs - Personally Reviewed  Physical Exam   GEN: Uncomfortable appearing on toilet.  Neck: No JVD Cardiac: irregular rhythm, no murmurs, rubs, or gallops.  Respiratory: Clear to auscultation bilaterally. GI: Soft, nontender, non-distended  MS: No deformity. Neuro:  Nonfocal  Psych: Normal affect   Labs    High Sensitivity Troponin:   Recent Labs  Lab 04/23/21 1847 04/23/21 2144  TROPONINIHS 204* 379*      Chemistry Recent Labs  Lab 04/23/21 2144 04/24/21 0249 04/24/21 1913 04/25/21 2217 04/25/21 2230 04/26/21 0737  NA  --  136 137 136   139 138 136  K  --  4.1 3.9 2.8*   2.8* 2.9* 3.2*  CL  --  100 95* 93*  --  93*  CO2  --  22 31 31   --  30  GLUCOSE  --  132* 117* 252*  --  171*  BUN  --  14 13 14   --  15  CREATININE  --  1.23* 1.28* 1.40*  --  1.43*  CALCIUM  --  9.2 9.4 8.8*  --  8.8*  MG 2.1 2.0  --  1.8  --  1.9  PROT 6.8  --   --  6.8  --   --   ALBUMIN 3.7  --   --  3.6  --   --   AST 58*  --   --  36  --   --   ALT 24  --   --  20  --   --   ALKPHOS 90  --   --  89  --   --   BILITOT 0.6  --   --  0.8  --   --   GFRNONAA  --  45* 43* 39*  --  38*  ANIONGAP  --  14 11 12   --  13     Lipids  Recent Labs  Lab 04/23/21 2146  CHOL 253*  TRIG 53  HDL 101  LDLCALC 141*  CHOLHDL 2.5     Hematology Recent Labs  Lab 04/25/21 0549 04/25/21 2217 04/25/21 2230 04/26/21 0737  WBC 6.5 6.9  --  8.6  RBC 4.12 4.09  --  4.21  HGB 12.3 12.0   13.3 13.6 12.4  HCT 38.3 37.6   39.0 40.0 38.3  MCV 93.0 91.9  --  91.0  MCH 29.9 29.3  --  29.5  MCHC 32.1 31.9  --  32.4  RDW 16.5* 16.3*  --  16.1*  PLT 147* 136*  --  142*    Thyroid  Recent Labs  Lab 04/23/21 2146  TSH 3.737     BNP Recent Labs  Lab 04/23/21 2144  BNP 379.1*     DDimer No results for input(s): DDIMER in the last 168 hours.   Radiology    DG Chest Port 1 View  Result Date: 04/25/2021 CLINICAL DATA:  Non  STEMI. EXAM: PORTABLE CHEST 1 VIEW COMPARISON:  Chest radiograph dated 04/25/2021. FINDINGS: Right-sided PICC in similar position. No focal consolidation, pleural effusion, pneumothorax. Stable cardiomegaly. No acute osseous pathology. IMPRESSION: 1. No acute cardiopulmonary process. 2. Stable cardiomegaly. Electronically Signed   By: Anner Crete M.D.   On: 04/25/2021 22:50   DG CHEST PORT 1 VIEW  Result Date: 04/25/2021 CLINICAL DATA:  PICC placement. EXAM: PORTABLE CHEST 1 VIEW COMPARISON:  Chest radiographs 12/18/2020 and CTA 04/23/2021 FINDINGS: A right PICC has been placed, the tip of which is difficult to visualize due to superimposition of the spine and telemetry leads, however it likely is located in the lower SVC. The cardiac silhouette remains mildly enlarged. No airspace consolidation, edema, pleural effusion, pneumothorax is identified. No acute osseous abnormality is seen. IMPRESSION: Right PICC placement as above. Electronically Signed   By: Logan Bores M.D.   On: 04/25/2021 12:18   ECHOCARDIOGRAM COMPLETE  Result Date: 04/24/2021    ECHOCARDIOGRAM REPORT   Patient Name:   Sheri Morgan Date of Exam: 04/24/2021 Medical Rec #:  [id number]      Height:       63.5 in Accession #:    [id number]     Weight:       202.8 lb Date of Birth:  Jan 06, 1946     BSA:          1.957 m Patient Age:    56 years       BP:           98/61 mmHg Patient Gender: F              HR:           65 bpm. Exam Location:  Inpatient  Procedure: 2D Echo Indications:    NSTEMI  History:        Patient has prior history of Echocardiogram examinations, most                 recent 07/14/2020. Arrythmias:Atrial Fibrillation; Risk                 Factors:Hypertension.  Sonographer:    Jefferey Pica Referring Phys: [place]  1. No left ventricular thrombus is seen. Left ventricular ejection fraction, by estimation, is 20 to 25%. The left ventricle has severely decreased function. The left  ventricle demonstrates global hypokinesis. The left ventricular internal cavity size was mildly dilated. Left ventricular diastolic function could not be evaluated.  2. Right ventricular systolic function is moderately reduced. The right ventricular size is mildly enlarged. There is moderately elevated pulmonary artery systolic pressure. The estimated right ventricular systolic pressure is 0000000 mmHg.  3. Left atrial size was severely dilated.  4. The mitral valve is normal in structure. Mild to moderate mitral valve regurgitation.  5. The aortic valve is tricuspid. There is mild thickening of the aortic valve. Aortic valve regurgitation is not visualized. Aortic valve sclerosis is present, with no evidence of aortic valve stenosis.  6. The inferior vena cava is normal in size with greater than 50% respiratory variability, suggesting right atrial pressure of 3 mmHg. Comparison(s): Prior images reviewed side by side. The left ventricular function is worsened. FINDINGS  Left Ventricle: No left ventricular thrombus is seen. Left ventricular ejection fraction, by estimation, is 20 to 25%. The left ventricle has severely decreased function. The left ventricle demonstrates global hypokinesis. The left ventricular internal cavity size was mildly dilated. There is no left ventricular hypertrophy. Left ventricular diastolic function could not be evaluated due to atrial fibrillation. Left ventricular diastolic function could not be evaluated. Right Ventricle: The right ventricular size is mildly enlarged. No increase in right ventricular wall thickness. Right ventricular systolic function is moderately reduced. There is moderately elevated pulmonary artery systolic pressure. The tricuspid regurgitant velocity is 3.57 m/s, and with an assumed right atrial pressure of 3 mmHg, the estimated right ventricular systolic pressure is 0000000 mmHg. Left Atrium: Left atrial size was severely dilated. Right Atrium: Right atrial size was  normal in size. Pericardium: There is no evidence of pericardial effusion. Mitral Valve: The mitral valve is normal in structure. Mild to moderate mitral valve regurgitation, with centrally-directed jet. Tricuspid Valve: The tricuspid valve is normal in structure. Tricuspid valve regurgitation is mild. Aortic Valve: The aortic valve is tricuspid. There is mild thickening of the aortic valve. Aortic valve regurgitation is not visualized. Aortic valve sclerosis is present, with no evidence of aortic valve stenosis. Aortic valve peak gradient measures 4.9  mmHg. Pulmonic Valve: The pulmonic valve was normal in structure. Pulmonic valve regurgitation is mild. Aorta: The aortic root and ascending aorta are structurally normal, with no evidence of dilitation. Venous: The inferior vena cava is normal in size with greater than 50% respiratory variability, suggesting right atrial pressure of 3 mmHg. IAS/Shunts: No atrial level shunt detected by color flow Doppler.  LEFT VENTRICLE PLAX 2D LVIDd:         5.20 cm LVIDs:         4.90 cm LV PW:         1.20 cm LV IVS:        1.00 cm LVOT diam:     1.90 cm LV SV:  45 LV SV Index:   23 LVOT Area:     2.84 cm  LV Volumes (MOD) LV vol d, MOD A4C: 203.0 ml LV vol s, MOD A4C: 150.0 ml LV SV MOD A4C:     53.0 ml RIGHT VENTRICLE            IVC RV Basal diam:  3.20 cm    IVC diam: 1.80 cm RV S prime:     6.75 cm/s LEFT ATRIUM             Index        RIGHT ATRIUM           Index LA diam:        4.90 cm 2.50 cm/m   RA Area:     18.70 cm LA Vol (A2C):   77.3 ml 39.51 ml/m  RA Volume:   59.50 ml  30.41 ml/m LA Vol (A4C):   61.3 ml 31.33 ml/m LA Biplane Vol: 71.2 ml 36.39 ml/m  AORTIC VALVE                 PULMONIC VALVE AV Area (Vmax): 2.41 cm     PV Vmax:       0.55 m/s AV Vmax:        110.62 cm/s  PV Peak grad:  1.2 mmHg AV Peak Grad:   4.9 mmHg LVOT Vmax:      94.10 cm/s LVOT Vmean:     47.950 cm/s LVOT VTI:       0.159 m  AORTA Ao Root diam: 2.90 cm Ao Asc diam:  3.30 cm  MITRAL VALVE               TRICUSPID VALVE MV Area (PHT): 3.68 cm    TR Peak grad:   51.0 mmHg MV Decel Time: 206 msec    TR Vmax:        357.00 cm/s MR Peak grad: 52.6 mmHg MR Vmax:      362.50 cm/s  SHUNTS MV E velocity: 79.50 cm/s  Systemic VTI:  0.16 m                            Systemic Diam: 1.90 cm Dani Gobble Croitoru MD Electronically signed by Sanda Klein MD Signature Date/Time: 04/24/2021/12:34:40 PM    Final    [place] EKG SITE RITE  Result Date: 04/25/2021 If Site Rite image not attached, placement could not be confirmed due to current cardiac rhythm.   Cardiac Studies   Echo from yesterday reveiwed. EF 15%. Global hypokinesis.  Assessment & Plan    75yo woman admitted with elevated Tn, worsenin LV function, hypotension, N/V. Cath without significant CAD. Tele with nonsustained PMVT that appear brady-mediated.  #NSVT Lidocaine started yesterday but shortly after it started patient developed nausea/vomiting. Lido is also not suppressing her arrhythmias. I have stopped the lidocaine this morning. No sustained episodes of VT. - monitor off lido this AM. If increased ectopy off lidocaine or if CO/CI poor despite dobutamine, consider TVP for chronotropy and to overdrive suppress ventricular ectopy - Keep K>4, Mg>2  #Acute systolic heart failure SBP in the 80s this AM on dobutamine 2.5.  Co-ox pending this AM. Bradycardic        CRITICAL CARE Performed by: Vickie Epley   Total critical care time: 45 minutes  Critical care time was exclusive of separately billable procedures and treating other patients.  Critical care was necessary to  treat or prevent imminent or life-threatening deterioration.  Critical care was time spent personally by me on the following activities: development of treatment plan with patient and/or surrogate as well as nursing, discussions with consultants, evaluation of patient's response to treatment, examination of patient, obtaining history from  patient or surrogate, ordering and performing treatments and interventions, ordering and review of laboratory studies, ordering and review of radiographic studies, pulse oximetry and re-evaluation of patient's condition.   For questions or updates, please contact [place] Please consult [email or web address] for contact info under        Signed, Vickie Epley, MD  04/26/2021, 9:17 AM       ---------------------------------------------------------------- ADDENDUM 10:15 AM  Discussed with HF team.  Plan to start empiric steroid for possible diagnosis of cardiac sarcoidosis. Ideally would get a cMR once clinically more stable. Stopping dobutamine.  Lysbeth Galas T. Quentin Ore, MD, [facility], [facility] Cardiac Electrophysiology

## 2021-04-26 NOTE — Progress Notes (Signed)
Advanced Heart Failure Rounding Note   Subjective:    Dobutamine and lidocaine started yesterday for low BP, bradycardia and frequent NSVT/PMVT.   Initial co-ox 65% CVP 6.  Developed n/v with lidocaine and ectopy persisted so lido stopped this am by EP. SBP 70-90s this am. Now 105  Co-ox 69%  Labs with contraction alkalosis  SCr stable at 1.4 K 3.2   On tele she has had a significant reduction of PVC burden 30-66min-> 5-10/min with lidocaine   Objective:   Weight Range:  Vital Signs:   Temp:  [97.8 F (36.6 C)-98.6 F (37 C)] 98.3 F (36.8 C) (02/12 0351) Pulse Rate:  [37-86] 57 (02/12 0830) Resp:  [12-25] 23 (02/12 0830) BP: (72-131)/(28-111) 103/84 (02/12 0830) SpO2:  [88 %-100 %] 91 % (02/12 0830) Last BM Date: 04/23/21  Weight change: Filed Weights   04/23/21 1900 04/24/21 0015  Weight: 95.3 kg 92 kg    Intake/Output:   Intake/Output Summary (Last 24 hours) at 04/26/2021 0937 Last data filed at 04/26/2021 0930 Gross per 24 hour  Intake 685.86 ml  Output 1850 ml  Net -1164.14 ml     Physical Exam: General:  Weak appearing. NAD HEENT: normal Neck: supple. no JVD. Carotids 2+ bilat; no bruits. No lymphadenopathy or thryomegaly appreciated. Cor: PMI nondisplaced. Irregular rate & rhythm. No rubs, gallops or murmurs. Lungs: clear Abdomen: soft, nontender, nondistended. No hepatosplenomegaly. No bruits or masses. Good bowel sounds. Extremities: no cyanosis, clubbing, rash, edema Neuro: alert & orientedx3, cranial nerves grossly intact. moves all 4 extremities w/o difficulty. Affect pleasant   Telemetry: Sinus brady with PVCs burden 5-10/hr  Personally reviewed   Labs: Basic Metabolic Panel: Recent Labs  Lab 04/23/21 1847 04/23/21 1909 04/23/21 2144 04/24/21 0249 04/24/21 1913 04/25/21 2217 04/25/21 2230 04/26/21 0737  NA 139 140  --  136 137 136   139 138 136  K 3.4* 3.5  --  4.1 3.9 2.8*   2.8* 2.9* 3.2*  CL 103 103  --  100 95* 93*  --   93*  CO2 25  --   --  22 31 31   --  30  GLUCOSE 134* 138*  --  132* 117* 252*  --  171*  BUN 15 16  --  14 13 14   --  15  CREATININE 1.41* 1.30*  --  1.23* 1.28* 1.40*  --  1.43*  CALCIUM 9.0  --   --  9.2 9.4 8.8*  --  8.8*  MG  --   --  2.1 2.0  --  1.8  --  1.9    Liver Function Tests: Recent Labs  Lab 04/23/21 2144 04/25/21 2217  AST 58* 36  ALT 24 20  ALKPHOS 90 89  BILITOT 0.6 0.8  PROT 6.8 6.8  ALBUMIN 3.7 3.6   No results for input(s): LIPASE, AMYLASE in the last 168 hours. No results for input(s): AMMONIA in the last 168 hours.  CBC: Recent Labs  Lab 04/23/21 1847 04/23/21 1909 04/24/21 0249 04/25/21 0549 04/25/21 2217 04/25/21 2230 04/26/21 0737  WBC 7.0  --  7.3 6.5 6.9  --  8.6  HGB 12.7   < > 13.8 12.3 12.0   13.3 13.6 12.4  HCT 39.7   < > 41.8 38.3 37.6   39.0 40.0 38.3  MCV 92.5  --  90.3 93.0 91.9  --  91.0  PLT 180  --  144* 147* 136*  --  142*   < > =  values in this interval not displayed.    Cardiac Enzymes: No results for input(s): CKTOTAL, CKMB, CKMBINDEX, TROPONINI in the last 168 hours.  BNP: BNP (last 3 results) Recent Labs    04/23/21 2144  BNP 379.1*    ProBNP (last 3 results) No results for input(s): PROBNP in the last 8760 hours.    Other results:  Imaging: DG Chest Port 1 View  Result Date: 04/25/2021 CLINICAL DATA:  Non STEMI. EXAM: PORTABLE CHEST 1 VIEW COMPARISON:  Chest radiograph dated 04/25/2021. FINDINGS: Right-sided PICC in similar position. No focal consolidation, pleural effusion, pneumothorax. Stable cardiomegaly. No acute osseous pathology. IMPRESSION: 1. No acute cardiopulmonary process. 2. Stable cardiomegaly. Electronically Signed   By: Anner Crete M.D.   On: 04/25/2021 22:50   DG CHEST PORT 1 VIEW  Result Date: 04/25/2021 CLINICAL DATA:  PICC placement. EXAM: PORTABLE CHEST 1 VIEW COMPARISON:  Chest radiographs 12/18/2020 and CTA 04/23/2021 FINDINGS: A right PICC has been placed, the tip of which is  difficult to visualize due to superimposition of the spine and telemetry leads, however it likely is located in the lower SVC. The cardiac silhouette remains mildly enlarged. No airspace consolidation, edema, pleural effusion, pneumothorax is identified. No acute osseous abnormality is seen. IMPRESSION: Right PICC placement as above. Electronically Signed   By: Logan Bores M.D.   On: 04/25/2021 12:18   [place] EKG SITE RITE  Result Date: 04/25/2021 If Site Rite image not attached, placement could not be confirmed due to current cardiac rhythm.    Medications:     Scheduled Medications:  aspirin EC  81 mg Oral Daily   Chlorhexidine Gluconate Cloth  6 each Topical Daily   dapagliflozin propanediol  10 mg Oral Daily   furosemide  40 mg Intravenous BID   levothyroxine  200 mcg Oral QAC breakfast   mouth rinse  15 mL Mouth Rinse BID   pantoprazole  40 mg Oral Daily   rosuvastatin  40 mg Oral Daily   senna-docusate  2 tablet Oral BID   sodium chloride flush  10-40 mL Intracatheter Q12H   sodium chloride flush  3 mL Intravenous Q12H    Infusions:  sodium chloride 250 mL (04/26/21 0925)   sodium chloride Stopped (04/25/21 1526)   DOBUTamine 2.5 mcg/kg/min (04/26/21 0600)   heparin 1,200 Units/hr (04/26/21 0600)   lidocaine 1 mg/min (04/26/21 0600)   magnesium sulfate bolus IVPB 2 g (04/26/21 0926)   norepinephrine (LEVOPHED) Adult infusion Stopped (04/25/21 1149)    PRN Medications: sodium chloride, acetaminophen, lidocaine (PF), nitroGLYCERIN, ondansetron (ZOFRAN) IV, sodium chloride flush, sodium chloride flush   Assessment/Plan:   1.  Acute on chronic systolic HF due to NICM -> cardiogenic shock - 5/22 ECHO EF 30-35% - Cath 2/23 minimal CAD. hstroponin 204 -> 379 - Echo 2/23: EF 20-25% - Now on DBA 2.5. Co-ox 69% CVP 3-4  Now with contraction alkalosis. Hold diuretics. Will give gentle IVFs - echo does not look like Tako-tsubo despite presentation. With ectopy could be Giant Cell  but time course and only mild hsTn elevation doesn't fit.Has h/o PVCs - On chest CT but there is LV apical scar in the absence of significant epicardial CAD on cath so suspect this is likely sarcoid though there is no evidence of extra-cardiac sarcoid on chest CT. Differential for cardiomyopathy also includes PVC-mediated, giant cell (unlikely).  - Continue dobutamine for now. Wil try to wean today - Start mexilitene ann pulse-dose steroids - will need cMRI tomorrow  2. PVCs/NSVT/PMVT - EP following.  - did not tolerate amio due to worsening bradycardia - off lido due to n/v that said PVC burden much decreased with lido - trial of mexilitene 200 bid - pulse dose steroids for possible cardiac sarcoid - check ACE level - Keep K> 4.0 Mg > 2.0    3. Paroxysmal AF - now in NSR - apixaban on hold (was not taking at home) - continue heparin  4. Hypokalemia - supp   CRITICAL CARE Performed by: Glori Bickers  Total critical care time: 45 minutes  Critical care time was exclusive of separately billable procedures and treating other patients.  Critical care was necessary to treat or prevent imminent or life-threatening deterioration.  Critical care was time spent personally by me (independent of midlevel providers or residents) on the following activities: development of treatment plan with patient and/or surrogate as well as nursing, discussions with consultants, evaluation of patient's response to treatment, examination of patient, obtaining history from patient or surrogate, ordering and performing treatments and interventions, ordering and review of laboratory studies, ordering and review of radiographic studies, pulse oximetry and re-evaluation of patient's condition.   Length of Stay: 3   Glori Bickers MD 04/26/2021, 9:37 AM  Advanced Heart Failure Team Pager [phone] (M-F; 7a - 4p)  Please contact [facility] for night-coverage after hours (4p -7a ) and weekends on  [email or web address]

## 2021-04-26 NOTE — Progress Notes (Addendum)
ANTICOAGULATION CONSULT NOTE  Pharmacy Consult for heparin Indication: atrial fibrillation  Allergies  Allergen Reactions   Codeine Itching    Patient Measurements: Height: 5' 3.5" (161.3 cm) Weight: 92 kg (202 lb 13.2 oz) IBW/kg (Calculated) : 53.56 Heparin Dosing Weight: 75kg  Vital Signs: Temp: 98.3 F (36.8 C) (02/12 0351) Temp Source: Oral (02/12 0351) BP: 103/66 (02/12 1000) Pulse Rate: 85 (02/12 1000)  Labs: Recent Labs    04/23/21 1847 04/23/21 1909 04/23/21 2144 04/23/21 2146 04/24/21 0249 04/24/21 1913 04/25/21 0549 04/25/21 2217 04/25/21 2230 04/26/21 0737 04/26/21 0937  HGB 12.7   < >  --   --    < >  --  12.3 12.0   13.3 13.6 12.4  --   HCT 39.7   < >  --   --    < >  --  38.3 37.6   39.0 40.0 38.3  --   PLT 180  --   --   --    < >  --  147* 136*  --  142*  --   APTT  --   --  24  --   --   --   --   --   --   --   --   LABPROT  --   --  12.7  --   --   --   --   --   --   --   --   INR  --   --  1.0  --   --   --   --   --   --   --   --   HEPARINUNFRC  --   --   --  <0.10*  --   --   --   --   --   --  >1.10*  CREATININE 1.41*   < >  --   --    < > 1.28*  --  1.40*  --  1.43*  --   TROPONINIHS 204*  --  379*  --   --   --   --   --   --   --   --    < > = values in this interval not displayed.     Estimated Creatinine Clearance (by C-G formula based on SCr of 1.43 mg/dL (H)) Female: 35.9 mL/min (A) Female: 43.9 mL/min (A)   Medical History: Past Medical History:  Diagnosis Date   Anxiety    Atypical atrial flutter (HCC)    Fibromyalgia    Hypertension    Paroxysmal atrial fibrillation (HCC)    Premature atrial contractions    Premature ventricular contraction      Assessment: 34 yoF admitted with SOB and possible ACS. Pt s/p LHC with nonobstructive CAD. Pt previously on apixaban for AF with noted noncompliance, resumed post/cath. Pt now with worsening shock, will transition to IV heparin for procedures.   Initial heparin level  altered by DOAC as expected, aPTT is slightly above goal at 105 seconds. Labs drawn peripherally, heparin infusing centrally. CBC this am stable.  Goal of Therapy:  Heparin level 0.3-0.7 units/ml aPTT 66-102 seconds Monitor platelets by anticoagulation protocol: Yes   Plan:  Reduce heparin to 1050 units/h Recheck coags with am labs   Arrie Senate, PharmD, BCPS, [facility] Clinical Pharmacist [phone] Please check AMION for all [facility] Pharmacy numbers 04/26/2021

## 2021-04-27 ENCOUNTER — Other Ambulatory Visit (HOSPITAL_COMMUNITY): Payer: Self-pay

## 2021-04-27 DIAGNOSIS — R570 Cardiogenic shock: Secondary | ICD-10-CM | POA: Diagnosis not present

## 2021-04-27 DIAGNOSIS — I472 Ventricular tachycardia, unspecified: Secondary | ICD-10-CM | POA: Diagnosis not present

## 2021-04-27 DIAGNOSIS — I5023 Acute on chronic systolic (congestive) heart failure: Secondary | ICD-10-CM | POA: Diagnosis not present

## 2021-04-27 LAB — CBC
HCT: 37.4 % (ref 36.0–46.0)
Hemoglobin: 12.4 g/dL (ref 12.0–15.0)
MCH: 29.9 pg (ref 26.0–34.0)
MCHC: 33.2 g/dL (ref 30.0–36.0)
MCV: 90.1 fL (ref 80.0–100.0)
Platelets: 142 10*3/uL — ABNORMAL LOW (ref 150–400)
RBC: 4.15 MIL/uL (ref 3.87–5.11)
RDW: 16.3 % — ABNORMAL HIGH (ref 11.5–15.5)
WBC: 7.8 10*3/uL (ref 4.0–10.5)
nRBC: 0 % (ref 0.0–0.2)

## 2021-04-27 LAB — GLUCOSE, CAPILLARY
Glucose-Capillary: 134 mg/dL — ABNORMAL HIGH (ref 70–99)
Glucose-Capillary: 133 mg/dL — ABNORMAL HIGH (ref 70–99)
Glucose-Capillary: 128 mg/dL — ABNORMAL HIGH (ref 70–99)
Glucose-Capillary: 140 mg/dL — ABNORMAL HIGH (ref 70–99)

## 2021-04-27 LAB — COOXEMETRY PANEL
Carboxyhemoglobin: 1 % (ref 0.5–1.5)
Methemoglobin: 0.9 % (ref 0.0–1.5)
O2 Saturation: 66.5 %
Total hemoglobin: 12.1 g/dL (ref 12.0–16.0)

## 2021-04-27 LAB — BASIC METABOLIC PANEL
Anion gap: 11 (ref 5–15)
BUN: 18 mg/dL (ref 8–23)
CO2: 27 mmol/L (ref 22–32)
Calcium: 9.1 mg/dL (ref 8.9–10.3)
Chloride: 100 mmol/L (ref 98–111)
Creatinine, Ser: 1.39 mg/dL — ABNORMAL HIGH (ref 0.44–1.00)
GFR, Estimated: 39 mL/min — ABNORMAL LOW (ref >60)
Glucose, Bld: 139 mg/dL — ABNORMAL HIGH (ref 70–99)
Potassium: 3.7 mmol/L (ref 3.5–5.1)
Sodium: 138 mmol/L (ref 135–145)

## 2021-04-27 LAB — LIDOCAINE LEVEL: Lidocaine Lvl: >10 ug/mL — ABNORMAL HIGH (ref 1.5–5.0)

## 2021-04-27 LAB — MAGNESIUM: Magnesium: 2.6 mg/dL — ABNORMAL HIGH (ref 1.7–2.4)

## 2021-04-27 LAB — APTT: aPTT: 115 seconds — ABNORMAL HIGH (ref 24–36)

## 2021-04-27 LAB — HEPARIN LEVEL (UNFRACTIONATED): Heparin Unfractionated: >1.1 IU/mL — ABNORMAL HIGH (ref 0.30–0.70)

## 2021-04-27 MED ORDER — SPIRONOLACTONE 12.5 MG HALF TABLET
12.5000 mg | ORAL_TABLET | Freq: Every day | ORAL | Status: DC
  Administered 2021-04-27 – 2021-05-02 (×6): 12.5 mg via ORAL
  Filled 2021-04-27 (×7): qty 1

## 2021-04-27 MED ORDER — MELATONIN 3 MG PO TABS
3.0000 mg | ORAL_TABLET | Freq: Every day | ORAL | Status: DC
  Administered 2021-04-27 – 2021-05-10 (×14): 3 mg via ORAL
  Filled 2021-04-27 (×14): qty 1

## 2021-04-27 MED ORDER — APIXABAN 5 MG PO TABS
5.0000 mg | ORAL_TABLET | Freq: Two times a day (BID) | ORAL | Status: DC
  Administered 2021-04-27 – 2021-05-03 (×14): 5 mg via ORAL
  Filled 2021-04-27 (×14): qty 1

## 2021-04-27 MED ORDER — LORAZEPAM 1 MG PO TABS: 1.0000 mg | ORAL_TABLET | ORAL | Status: DC

## 2021-04-27 MED ORDER — POTASSIUM CHLORIDE CRYS ER 20 MEQ PO TBCR
40.0000 meq | EXTENDED_RELEASE_TABLET | Freq: Four times a day (QID) | ORAL | Status: AC
  Administered 2021-04-27 (×2): 40 meq via ORAL
  Filled 2021-04-27 (×2): qty 2

## 2021-04-27 NOTE — TOC Benefit Eligibility Note (Addendum)
Patient Product/process development scientist completed.    The patient is currently admitted and upon discharge could be taking Eliquis 5 mg.  The current 30 day co-pay is, $37.00.   The patient is currently admitted and upon discharge could be taking Farxiga 10 mg.  The current 30 day co-pay is, $37.00.   The patient is currently admitted and upon discharge could be taking Jardiance 10 mg.  The current 30 day co-pay is, $37.00.   The patient is currently admitted and upon discharge could be taking mexiletine (Mexitil) 200 mg.  The current 30 day co-pay is, $37.00.   The patient is insured through [employer] of [place] Medicare Part D     Sheri Morgan, CPhT Pharmacy Patient Advocate Specialist [facility] Patient Advocate Team Direct Number: [phone]  Fax: [phone]

## 2021-04-27 NOTE — Progress Notes (Addendum)
Advanced Heart Failure Rounding Note   Subjective:    On DBA 2.5. Co-ox 67%. CVP 3   PVCs better suppressed ~10/min.   Feels much better. Breathing improved. No chest pain    Objective:   Weight Range:  Vital Signs:   Temp:  [97.8 F (36.6 C)-98.9 F (37.2 C)] 98.7 F (37.1 C) (02/13 0739) Pulse Rate:  [32-95] 93 (02/13 0900) Resp:  [13-23] 17 (02/13 0900) BP: (80-143)/(53-81) 100/67 (02/13 0900) SpO2:  [76 %-98 %] 95 % (02/13 0900) Weight:  [88.3 kg] 88.3 kg (02/13 0425) Last BM Date: 04/23/21  Weight change: Filed Weights   04/23/21 1900 04/24/21 0015 04/27/21 0425  Weight: 95.3 kg 92 kg 88.3 kg    Intake/Output:   Intake/Output Summary (Last 24 hours) at 04/27/2021 0931 Last data filed at 04/27/2021 0800 Gross per 24 hour  Intake 2006.62 ml  Output 2100 ml  Net -93.38 ml    PHYSICAL EXAM: General:  Well appearing. No respiratory difficulty HEENT: normal Neck: supple. no JVD. Carotids 2+ bilat; no bruits. No lymphadenopathy or thyromegaly appreciated. Cor: PMI nondisplaced. Regular rate & rhythm. No rubs, gallops or murmurs. Lungs: clear Abdomen: soft, nontender, nondistended. No hepatosplenomegaly. No bruits or masses. Good bowel sounds. Extremities: no cyanosis, clubbing, rash, edema Neuro: alert & oriented x 3, cranial nerves grossly intact. moves all 4 extremities w/o difficulty. Affect pleasant.   Telemetry: NSR 90, PVCs burden 10/hr  Personally reviewed   Labs: Basic Metabolic Panel: Recent Labs  Lab 04/23/21 2144 04/24/21 0249 04/24/21 1913 04/25/21 2217 04/25/21 2230 04/26/21 0737 04/27/21 0403  NA  --  136 137 136   139 138 136 138  K  --  4.1 3.9 2.8*   2.8* 2.9* 3.2* 3.7  CL  --  100 95* 93*  --  93* 100  CO2  --  22 31 31   --  30 27  GLUCOSE  --  132* 117* 252*  --  171* 139*  BUN  --  14 13 14   --  15 18  CREATININE  --  1.23* 1.28* 1.40*  --  1.43* 1.39*  CALCIUM  --  9.2 9.4 8.8*  --  8.8* 9.1  MG 2.1 2.0  --  1.8  --   1.9 2.6*    Liver Function Tests: Recent Labs  Lab 04/23/21 2144 04/25/21 2217  AST 58* 36  ALT 24 20  ALKPHOS 90 89  BILITOT 0.6 0.8  PROT 6.8 6.8  ALBUMIN 3.7 3.6   No results for input(s): LIPASE, AMYLASE in the last 168 hours. No results for input(s): AMMONIA in the last 168 hours.  CBC: Recent Labs  Lab 04/24/21 0249 04/25/21 0549 04/25/21 2217 04/25/21 2230 04/26/21 0737 04/27/21 0403  WBC 7.3 6.5 6.9  --  8.6 7.8  HGB 13.8 12.3 12.0   13.3 13.6 12.4 12.4  HCT 41.8 38.3 37.6   39.0 40.0 38.3 37.4  MCV 90.3 93.0 91.9  --  91.0 90.1  PLT 144* 147* 136*  --  142* 142*    Cardiac Enzymes: No results for input(s): CKTOTAL, CKMB, CKMBINDEX, TROPONINI in the last 168 hours.  BNP: BNP (last 3 results) Recent Labs    04/23/21 2144  BNP 379.1*    ProBNP (last 3 results) No results for input(s): PROBNP in the last 8760 hours.    Other results:  Imaging: DG Chest Port 1 View  Result Date: 04/25/2021 CLINICAL DATA:  Non STEMI. EXAM: PORTABLE CHEST  1 VIEW COMPARISON:  Chest radiograph dated 04/25/2021. FINDINGS: Right-sided PICC in similar position. No focal consolidation, pleural effusion, pneumothorax. Stable cardiomegaly. No acute osseous pathology. IMPRESSION: 1. No acute cardiopulmonary process. 2. Stable cardiomegaly. Electronically Signed   By: Anner Crete M.D.   On: 04/25/2021 22:50   DG CHEST PORT 1 VIEW  Result Date: 04/25/2021 CLINICAL DATA:  PICC placement. EXAM: PORTABLE CHEST 1 VIEW COMPARISON:  Chest radiographs 12/18/2020 and CTA 04/23/2021 FINDINGS: A right PICC has been placed, the tip of which is difficult to visualize due to superimposition of the spine and telemetry leads, however it likely is located in the lower SVC. The cardiac silhouette remains mildly enlarged. No airspace consolidation, edema, pleural effusion, pneumothorax is identified. No acute osseous abnormality is seen. IMPRESSION: Right PICC placement as above. Electronically  Signed   By: Logan Bores M.D.   On: 04/25/2021 12:18   [place] EKG SITE RITE  Result Date: 04/25/2021 If Site Rite image not attached, placement could not be confirmed due to current cardiac rhythm.    Medications:     Scheduled Medications:  apixaban  5 mg Oral BID   aspirin EC  81 mg Oral Daily   Chlorhexidine Gluconate Cloth  6 each Topical Daily   dapagliflozin propanediol  10 mg Oral Daily   insulin aspart  0-9 Units Subcutaneous TID WC   levothyroxine  200 mcg Oral QAC breakfast   mouth rinse  15 mL Mouth Rinse BID   mexiletine  200 mg Oral Q12H   pantoprazole  40 mg Oral Daily   rosuvastatin  40 mg Oral Daily   senna-docusate  2 tablet Oral BID   sodium chloride flush  10-40 mL Intracatheter Q12H    Infusions:  sodium chloride Stopped (04/25/21 1526)   DOBUTamine 2.5 mcg/kg/min (04/27/21 0800)   methylPREDNISolone (SOLU-MEDROL) injection 500 mg (04/26/21 1135)   norepinephrine (LEVOPHED) Adult infusion Stopped (04/26/21 1039)    PRN Medications: acetaminophen, lidocaine (PF), nitroGLYCERIN, ondansetron (ZOFRAN) IV, sodium chloride flush   Assessment/Plan:   1.  Acute on chronic systolic HF due to NICM -> cardiogenic shock - 5/22 ECHO EF 30-35% - Cath 2/23 minimal CAD. hstroponin 204 -> 379 - Echo 2/23: EF 20-25% - Now on DBA 2.5. Co-ox 67%. Volume ok on exam.  - Wean DBA to 1.25 today  - Add spiro 12.5 mg daily  - echo does not look like Tako-tsubo despite presentation. With ectopy could be Giant Cell but time course and only mild hsTn elevation doesn't fit.Has h/o PVCs - On chest CT but there is LV apical scar in the absence of significant epicardial CAD on cath so suspect this is likely sarcoid though there is no evidence of extra-cardiac sarcoid on chest CT. Differential for cardiomyopathy also includes PVC-mediated, giant cell (unlikely).  - Continue mexiletine and pulse-dose steroids - plan cMRI today    2. PVCs/NSVT/PMVT - EP following.  - did not tolerate  amio due to worsening bradycardia - off lido due to n/v that said PVC burden much decreased with lido - trial of mexilitene 200 bid - pulse dose steroids for possible cardiac sarcoid - check ACE level - Keep K> 4.0 Mg > 2.0    3. Paroxysmal AF - now in NSR - start apixaban 5 mg bid  4. Hypokalemia - K 3.7 - supp    Length of Stay: 4   Brittainy Ladoris Gene  04/27/2021, 9:31 AM  Advanced Heart Failure Team Pager [phone] (M-F; 7a - 4p)  Please contact [facility] for night-coverage after hours (4p -7a ) and weekends on [email or web address]   Patient seen and examined with the above-signed Advanced Practice Provider and/or Housestaff. I personally reviewed laboratory data, imaging studies and relevant notes. I independently examined the patient and formulated the important aspects of the plan. I have edited the note to reflect any of my changes or salient points. I have personally discussed the plan with the patient and/or family.  Remains on DBA, mexilitene and pulse-dose steroids.   Feeling better today. Denies dyspnea. Ectopy settling down. Co-ox ok   General:  Well appearing. No resp difficulty HEENT: normal Neck: supple. no JVD. Carotids 2+ bilat; no bruits. No lymphadenopathy or thryomegaly appreciated. Cor: PMI nondisplaced. Regular rate & rhythm. No rubs, gallops or murmurs. Lungs: clear Abdomen: soft, nontender, nondistended. No hepatosplenomegaly. No bruits or masses. Good bowel sounds. Extremities: no cyanosis, clubbing, rash, edema Neuro: alert & orientedx3, cranial nerves grossly intact. moves all 4 extremities w/o difficulty. 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 Wean DBA to 1.25. Continue steroids and mexilitene. cMRI today. Titrate GDMT as tolerated.   Glori Bickers, MD  11:24 AM

## 2021-04-27 NOTE — Progress Notes (Signed)
Progress Note  Patient Name: Sheri Morgan Date of Encounter: 04/27/2021  [facility] Cardiologist: Elouise Munroe, MD   Subjective   Feeling much better  Inpatient Medications    Scheduled Meds:  apixaban  5 mg Oral BID   aspirin EC  81 mg Oral Daily   Chlorhexidine Gluconate Cloth  6 each Topical Daily   dapagliflozin propanediol  10 mg Oral Daily   insulin aspart  0-9 Units Subcutaneous TID WC   levothyroxine  200 mcg Oral QAC breakfast   mouth rinse  15 mL Mouth Rinse BID   mexiletine  200 mg Oral Q12H   pantoprazole  40 mg Oral Daily   potassium chloride  40 mEq Oral Q6H   rosuvastatin  40 mg Oral Daily   senna-docusate  2 tablet Oral BID   sodium chloride flush  10-40 mL Intracatheter Q12H   spironolactone  12.5 mg Oral Daily   Continuous Infusions:  sodium chloride Stopped (04/25/21 1526)   DOBUTamine 1.25 mcg/kg/min (04/27/21 0947)   methylPREDNISolone (SOLU-MEDROL) injection 500 mg (04/27/21 0955)   norepinephrine (LEVOPHED) Adult infusion Stopped (04/26/21 1039)   PRN Meds: acetaminophen, lidocaine (PF), nitroGLYCERIN, ondansetron (ZOFRAN) IV, sodium chloride flush   Vital Signs    Vitals:   04/27/21 0739 04/27/21 0823 04/27/21 0900 04/27/21 1115  BP:  98/67 100/67   Pulse:  95 93   Resp:  18 17   Temp: 98.7 F (37.1 C)   98.6 F (37 C)  TempSrc: Oral   Oral  SpO2:  90% 95%   Weight:      Height:        Intake/Output Summary (Last 24 hours) at 04/27/2021 1119 Last data filed at 04/27/2021 0800 Gross per 24 hour  Intake 1935.72 ml  Output 2100 ml  Net -164.28 ml   Last 3 Weights 04/27/2021 04/24/2021 04/23/2021  Weight (lbs) 194 lb 10.7 oz 202 lb 13.2 oz 210 lb 1.6 oz  Weight (kg) 88.3 kg 92 kg 95.3 kg      Telemetry    AFib/flutter 80's, PVC burden is improving - Personally Reviewed  ECG    No new EKGs - Personally Reviewed  Physical Exam   GEN: No acute distress.   Neck: No JVD Cardiac: irreg-irreg, no murmurs, rubs, or  gallops.  Respiratory: CTA b/l. GI: Soft, nontender, non-distended  MS: No edema; No deformity. Neuro:  Nonfocal  Psych: Normal affect   Labs    High Sensitivity Troponin:   Recent Labs  Lab 04/23/21 1847 04/23/21 2144  TROPONINIHS 204* 379*     Chemistry Recent Labs  Lab 04/23/21 2144 04/24/21 0249 04/25/21 2217 04/25/21 2230 04/26/21 0737 04/27/21 0403  NA  --    < > 136   139 138 136 138  K  --    < > 2.8*   2.8* 2.9* 3.2* 3.7  CL  --    < > 93*  --  93* 100  CO2  --    < > 31  --  30 27  GLUCOSE  --    < > 252*  --  171* 139*  BUN  --    < > 14  --  15 18  CREATININE  --    < > 1.40*  --  1.43* 1.39*  CALCIUM  --    < > 8.8*  --  8.8* 9.1  MG 2.1   < > 1.8  --  1.9 2.6*  PROT 6.8  --  6.8  --   --   --   ALBUMIN 3.7  --  3.6  --   --   --   AST 58*  --  36  --   --   --   ALT 24  --  20  --   --   --   ALKPHOS 90  --  89  --   --   --   BILITOT 0.6  --  0.8  --   --   --   GFRNONAA  --    < > 39*  --  38* 39*  ANIONGAP  --    < > 12  --  13 11   < > = values in this interval not displayed.    Lipids  Recent Labs  Lab 04/23/21 2146  CHOL 253*  TRIG 53  HDL 101  LDLCALC 141*  CHOLHDL 2.5    Hematology Recent Labs  Lab 04/25/21 2217 04/25/21 2230 04/26/21 0737 04/27/21 0403  WBC 6.9  --  8.6 7.8  RBC 4.09  --  4.21 4.15  HGB 12.0   13.3 13.6 12.4 12.4  HCT 37.6   39.0 40.0 38.3 37.4  MCV 91.9  --  91.0 90.1  MCH 29.3  --  29.5 29.9  MCHC 31.9  --  32.4 33.2  RDW 16.3*  --  16.1* 16.3*  PLT 136*  --  142* 142*   Thyroid  Recent Labs  Lab 04/23/21 2146  TSH 3.737    BNP Recent Labs  Lab 04/23/21 2144  BNP 379.1*    DDimer No results for input(s): DDIMER in the last 168 hours.   Radiology    DG Chest Port 1 View  Result Date: 04/25/2021 CLINICAL DATA:  Non STEMI. EXAM: PORTABLE CHEST 1 VIEW COMPARISON:  Chest radiograph dated 04/25/2021. FINDINGS: Right-sided PICC in similar position. No focal consolidation, pleural effusion,  pneumothorax. Stable cardiomegaly. No acute osseous pathology. IMPRESSION: 1. No acute cardiopulmonary process. 2. Stable cardiomegaly. Electronically Signed   By: Anner Crete M.D.   On: 04/25/2021 22:50   DG CHEST PORT 1 VIEW  Result Date: 04/25/2021 CLINICAL DATA:  PICC placement. EXAM: PORTABLE CHEST 1 VIEW COMPARISON:  Chest radiographs 12/18/2020 and CTA 04/23/2021 FINDINGS: A right PICC has been placed, the tip of which is difficult to visualize due to superimposition of the spine and telemetry leads, however it likely is located in the lower SVC. The cardiac silhouette remains mildly enlarged. No airspace consolidation, edema, pleural effusion, pneumothorax is identified. No acute osseous abnormality is seen. IMPRESSION: Right PICC placement as above. Electronically Signed   By: Logan Bores M.D.   On: 04/25/2021 12:18    Cardiac Studies   04/24/21: TTE IMPRESSIONS   1. No left ventricular thrombus is seen. Left ventricular ejection  fraction, by estimation, is 20 to 25%. The left ventricle has severely  decreased function. The left ventricle demonstrates global hypokinesis.  The left ventricular internal cavity size  was mildly dilated. Left ventricular diastolic function could not be  evaluated.   2. Right ventricular systolic function is moderately reduced. The right  ventricular size is mildly enlarged. There is moderately elevated  pulmonary artery systolic pressure. The estimated right ventricular  systolic pressure is 0000000 mmHg.   3. Left atrial size was severely dilated.   4. The mitral valve is normal in structure. Mild to moderate mitral valve  regurgitation.   5. The aortic valve is tricuspid. There is  mild thickening of the aortic  valve. Aortic valve regurgitation is not visualized. Aortic valve  sclerosis is present, with no evidence of aortic valve stenosis.   6. The inferior vena cava is normal in size with greater than 50%  respiratory variability, suggesting  right atrial pressure of 3 mmHg.   Comparison(s): Prior images reviewed side by side. The left ventricular  function is worsened.    04/23/21: LHC   Prox LAD lesion is 30% stenosed.   There is moderate left ventricular systolic dysfunction.   LV end diastolic pressure is severely elevated.   The left ventricular ejection fraction is 35-45% by visual estimate.   Mild non-obstructive CAD Elevated LVEDP LV wall motion abnormality noted with hand injection. (no power injection of the LV given elevated filling pressures). This may represent a Takotsubo's cardiomyopathy.  Ventricular tachycardia prior to cath.    Patient Profile     76 y.o. adult w/PMHx of Fibromyalgia, HTN, Afib  Admitted 2/9 with CP and worsening SOB. Labs notable for troponin 204 -> 379 and BNP 379.1. EKG showed sinus tach with sub-1 mm STE in I and aVL and sinus tachycardia. CT dissection protocol was negative for dissection.  Cath 2/10 with minimal CAD EF 20-25%. ? Possible takotsubo.  Echo 20-25% moderate MR. On way to cath developed VT. Started on amiodarone developed more bradycardia -> PMVT   Cardiogenic shock  Lidocaine stopped 2/2 N/V and failure to suppress arrhythmias NSPMVT (long-short sequenced)  Dobutamine gtt 1.25 Mexiletine 200mg  BID  Assessment & Plan    Recurrent NSPMVT On mexiletine Keep K+ and Mag supplemented  NICM Unclear etiology ? sarcoid Eventual MRI Planned for c.MRI today   Paroxysmal Afib (hx mentions an atypical flutter as well) CHA2DS2Vasc 4, on Eliquis appropriately dosed Dwyane Luo controlled   For questions or updates, please contact [place] Please consult [email or web address] for contact info under        Signed, Baldwin Jamaica, PA-C  04/27/2021, 11:19 AM

## 2021-04-27 NOTE — TOC CM/SW Note (Signed)
.. °  Transition of Care [facility]) Screening Note   Patient Details  Name: Sheri Morgan Date of Birth: 1945/09/14   Transition of Care [facility]) CM/SW Contact:    Elliot Cousin, RN Phone Number: 04/27/2021, 2:56 PM    Transition of Care Department [facility]) has reviewed patient. We will continue to monitor patient advancement through interdisciplinary progression rounds.  Pt may need HHR, or HH PT. Will need PT/OT recommendations. Isidoro Donning RN3 CCM, Heart Failure TOC CM [phone]

## 2021-04-28 ENCOUNTER — Inpatient Hospital Stay (HOSPITAL_COMMUNITY): Payer: Medicare Other

## 2021-04-28 DIAGNOSIS — I428 Other cardiomyopathies: Secondary | ICD-10-CM

## 2021-04-28 DIAGNOSIS — I5043 Acute on chronic combined systolic (congestive) and diastolic (congestive) heart failure: Secondary | ICD-10-CM

## 2021-04-28 DIAGNOSIS — I472 Ventricular tachycardia, unspecified: Secondary | ICD-10-CM | POA: Diagnosis not present

## 2021-04-28 LAB — BASIC METABOLIC PANEL
Anion gap: 9 (ref 5–15)
BUN: 24 mg/dL — ABNORMAL HIGH (ref 8–23)
CO2: 28 mmol/L (ref 22–32)
Calcium: 9.3 mg/dL (ref 8.9–10.3)
Chloride: 101 mmol/L (ref 98–111)
Creatinine, Ser: 1.35 mg/dL — ABNORMAL HIGH (ref 0.44–1.00)
GFR, Estimated: 40 mL/min — ABNORMAL LOW (ref >60)
Glucose, Bld: 156 mg/dL — ABNORMAL HIGH (ref 70–99)
Potassium: 4.6 mmol/L (ref 3.5–5.1)
Sodium: 138 mmol/L (ref 135–145)

## 2021-04-28 LAB — CBC
HCT: 39.2 % (ref 36.0–46.0)
Hemoglobin: 12.6 g/dL (ref 12.0–15.0)
MCH: 29.4 pg (ref 26.0–34.0)
MCHC: 32.1 g/dL (ref 30.0–36.0)
MCV: 91.6 fL (ref 80.0–100.0)
Platelets: 155 10*3/uL (ref 150–400)
RBC: 4.28 MIL/uL (ref 3.87–5.11)
RDW: 16.3 % — ABNORMAL HIGH (ref 11.5–15.5)
WBC: 8.9 10*3/uL (ref 4.0–10.5)
nRBC: 0 % (ref 0.0–0.2)

## 2021-04-28 LAB — ANGIOTENSIN CONVERTING ENZYME: Angiotensin-Converting Enzyme: 52 U/L (ref 14–82)

## 2021-04-28 LAB — GLUCOSE, CAPILLARY
Glucose-Capillary: 111 mg/dL — ABNORMAL HIGH (ref 70–99)
Glucose-Capillary: 99 mg/dL (ref 70–99)
Glucose-Capillary: 148 mg/dL — ABNORMAL HIGH (ref 70–99)
Glucose-Capillary: 142 mg/dL — ABNORMAL HIGH (ref 70–99)

## 2021-04-28 LAB — COOXEMETRY PANEL
Carboxyhemoglobin: 1 % (ref 0.5–1.5)
Methemoglobin: 0.9 % (ref 0.0–1.5)
O2 Saturation: 63.8 %
Total hemoglobin: 12.9 g/dL (ref 12.0–16.0)

## 2021-04-28 LAB — MAGNESIUM: Magnesium: 2.6 mg/dL — ABNORMAL HIGH (ref 1.7–2.4)

## 2021-04-28 MED ORDER — GADOBUTROL 1 MMOL/ML IV SOLN
10.0000 mL | Freq: Once | INTRAVENOUS | Status: AC | PRN
  Administered 2021-04-28: 10 mL via INTRAVENOUS

## 2021-04-28 MED ORDER — LOSARTAN POTASSIUM 25 MG PO TABS
12.5000 mg | ORAL_TABLET | Freq: Every day | ORAL | Status: DC
  Administered 2021-04-28 – 2021-04-30 (×3): 12.5 mg via ORAL
  Filled 2021-04-28 (×3): qty 1

## 2021-04-28 MED ORDER — BISACODYL 10 MG RE SUPP
10.0000 mg | Freq: Once | RECTAL | Status: AC
  Administered 2021-04-28: 10 mg via RECTAL
  Filled 2021-04-28: qty 1

## 2021-04-28 MED ORDER — LORAZEPAM 1 MG PO TABS
1.0000 mg | ORAL_TABLET | ORAL | Status: AC
Start: 2021-04-28 — End: 2021-04-28
  Administered 2021-04-28: 1 mg via ORAL
  Filled 2021-04-28: qty 1

## 2021-04-28 MED ORDER — POLYETHYLENE GLYCOL 3350 17 G PO PACK
17.0000 g | PACK | Freq: Every day | ORAL | Status: DC
  Administered 2021-04-28 – 2021-04-29 (×2): 17 g via ORAL
  Filled 2021-04-28 (×2): qty 1

## 2021-04-28 NOTE — Progress Notes (Signed)
Patient developed heart rate in the 200s. Blood pressure stable.12 lead obtained. MD Lendell Caprice at bedside. Patient now in normal sinus rhythm with frequent PVCs, Heart rate 98.  No new orders.

## 2021-04-28 NOTE — Progress Notes (Signed)
CARDIAC REHAB PHASE I   Went to see pt. Pts HR 180s, plans for cMRI shortly. Will f/u as appropriate.  Reynold Bowen, RN BSN 04/28/2021 2:10 PM

## 2021-04-28 NOTE — Progress Notes (Signed)
Progress Note  Patient Name: Sheri Morgan Date of Encounter: 04/28/2021  [facility] Cardiologist: Elouise Munroe, MD   Subjective   No new c/o, aware of RVR when she has it, disappointed, thought she was doing better  Inpatient Medications    Scheduled Meds:  apixaban  5 mg Oral BID   aspirin EC  81 mg Oral Daily   Chlorhexidine Gluconate Cloth  6 each Topical Daily   dapagliflozin propanediol  10 mg Oral Daily   insulin aspart  0-9 Units Subcutaneous TID WC   levothyroxine  200 mcg Oral QAC breakfast   LORazepam  1 mg Oral On Call   mouth rinse  15 mL Mouth Rinse BID   melatonin  3 mg Oral QHS   mexiletine  200 mg Oral Q12H   pantoprazole  40 mg Oral Daily   rosuvastatin  40 mg Oral Daily   senna-docusate  2 tablet Oral BID   sodium chloride flush  10-40 mL Intracatheter Q12H   spironolactone  12.5 mg Oral Daily   Continuous Infusions:  sodium chloride Stopped (04/25/21 1526)   methylPREDNISolone (SOLU-MEDROL) injection Stopped (04/27/21 1027)   PRN Meds: acetaminophen, lidocaine (PF), nitroGLYCERIN, ondansetron (ZOFRAN) IV, sodium chloride flush   Vital Signs    Vitals:   04/28/21 0524 04/28/21 0525 04/28/21 0600 04/28/21 0700  BP: (!) 129/111  (!) 123/92 99/71  Pulse: (!) 101  87 72  Resp: 18 16 15 20   Temp:      TempSrc:      SpO2: 97%  98% 99%  Weight:      Height:        Intake/Output Summary (Last 24 hours) at 04/28/2021 0858 Last data filed at 04/28/2021 0400 Gross per 24 hour  Intake 234.53 ml  Output 500 ml  Net -265.47 ml   Last 3 Weights 04/27/2021 04/24/2021 04/23/2021  Weight (lbs) 194 lb 10.7 oz 202 lb 13.2 oz 210 lb 1.6 oz  Weight (kg) 88.3 kg 92 kg 95.3 kg      Telemetry    AFib/flutter 80's, PVC burden about the same, AFlutter w/RVR- Personally Reviewed  ECG    This AM AFlutter  193 AFlutter 150 AFlutter 2:1 100bpm AFlutter 186   - Personally Reviewed  Physical Exam   GEN: No acute distress.   Neck: No  JVD Cardiac: irreg-irreg, no murmurs, rubs, or gallops.  Respiratory: CTA b/l. GI: Soft, nontender, non-distended  MS: No edema; No deformity. Neuro:  Nonfocal  Psych: Normal affect   Labs    High Sensitivity Troponin:   Recent Labs  Lab 04/23/21 1847 04/23/21 2144  TROPONINIHS 204* 379*     Chemistry Recent Labs  Lab 04/23/21 2144 04/24/21 0249 04/25/21 2217 04/25/21 2230 04/26/21 0737 04/27/21 0403 04/28/21 0425  NA  --    < > 136   139   < > 136 138 138  K  --    < > 2.8*   2.8*   < > 3.2* 3.7 4.6  CL  --    < > 93*  --  93* 100 101  CO2  --    < > 31  --  30 27 28   GLUCOSE  --    < > 252*  --  171* 139* 156*  BUN  --    < > 14  --  15 18 24*  CREATININE  --    < > 1.40*  --  1.43* 1.39* 1.35*  CALCIUM  --    < >  8.8*  --  8.8* 9.1 9.3  MG 2.1   < > 1.8  --  1.9 2.6* 2.6*  PROT 6.8  --  6.8  --   --   --   --   ALBUMIN 3.7  --  3.6  --   --   --   --   AST 58*  --  36  --   --   --   --   ALT 24  --  20  --   --   --   --   ALKPHOS 90  --  89  --   --   --   --   BILITOT 0.6  --  0.8  --   --   --   --   GFRNONAA  --    < > 39*  --  38* 39* 40*  ANIONGAP  --    < > 12  --  13 11 9    < > = values in this interval not displayed.    Lipids  Recent Labs  Lab 04/23/21 2146  CHOL 253*  TRIG 53  HDL 101  LDLCALC 141*  CHOLHDL 2.5    Hematology Recent Labs  Lab 04/26/21 0737 04/27/21 0403 04/28/21 0425  WBC 8.6 7.8 8.9  RBC 4.21 4.15 4.28  HGB 12.4 12.4 12.6  HCT 38.3 37.4 39.2  MCV 91.0 90.1 91.6  MCH 29.5 29.9 29.4  MCHC 32.4 33.2 32.1  RDW 16.1* 16.3* 16.3*  PLT 142* 142* 155   Thyroid  Recent Labs  Lab 04/23/21 2146  TSH 3.737    BNP Recent Labs  Lab 04/23/21 2144  BNP 379.1*    DDimer No results for input(s): DDIMER in the last 168 hours.   Radiology    DG Chest Port 1 View  Result Date: 04/25/2021 CLINICAL DATA:  Non STEMI. EXAM: PORTABLE CHEST 1 VIEW COMPARISON:  Chest radiograph dated 04/25/2021. FINDINGS: Right-sided PICC  in similar position. No focal consolidation, pleural effusion, pneumothorax. Stable cardiomegaly. No acute osseous pathology. IMPRESSION: 1. No acute cardiopulmonary process. 2. Stable cardiomegaly. Electronically Signed   By: Anner Crete M.D.   On: 04/25/2021 22:50   DG CHEST PORT 1 VIEW  Result Date: 04/25/2021 CLINICAL DATA:  PICC placement. EXAM: PORTABLE CHEST 1 VIEW COMPARISON:  Chest radiographs 12/18/2020 and CTA 04/23/2021 FINDINGS: A right PICC has been placed, the tip of which is difficult to visualize due to superimposition of the spine and telemetry leads, however it likely is located in the lower SVC. The cardiac silhouette remains mildly enlarged. No airspace consolidation, edema, pleural effusion, pneumothorax is identified. No acute osseous abnormality is seen. IMPRESSION: Right PICC placement as above. Electronically Signed   By: Logan Bores M.D.   On: 04/25/2021 12:18    Cardiac Studies   04/24/21: TTE IMPRESSIONS   1. No left ventricular thrombus is seen. Left ventricular ejection  fraction, by estimation, is 20 to 25%. The left ventricle has severely  decreased function. The left ventricle demonstrates global hypokinesis.  The left ventricular internal cavity size  was mildly dilated. Left ventricular diastolic function could not be  evaluated.   2. Right ventricular systolic function is moderately reduced. The right  ventricular size is mildly enlarged. There is moderately elevated  pulmonary artery systolic pressure. The estimated right ventricular  systolic pressure is 0000000 mmHg.   3. Left atrial size was severely dilated.   4. The mitral valve is normal in structure. Mild  to moderate mitral valve  regurgitation.   5. The aortic valve is tricuspid. There is mild thickening of the aortic  valve. Aortic valve regurgitation is not visualized. Aortic valve  sclerosis is present, with no evidence of aortic valve stenosis.   6. The inferior vena cava is normal in size  with greater than 50%  respiratory variability, suggesting right atrial pressure of 3 mmHg.   Comparison(s): Prior images reviewed side by side. The left ventricular  function is worsened.    04/23/21: LHC   Prox LAD lesion is 30% stenosed.   There is moderate left ventricular systolic dysfunction.   LV end diastolic pressure is severely elevated.   The left ventricular ejection fraction is 35-45% by visual estimate.   Mild non-obstructive CAD Elevated LVEDP LV wall motion abnormality noted with hand injection. (no power injection of the LV given elevated filling pressures). This may represent a Takotsubo's cardiomyopathy.  Ventricular tachycardia prior to cath.    Patient Profile     76 y.o. adult w/PMHx of Fibromyalgia, HTN, Afib  Admitted 2/9 with CP and worsening SOB. Labs notable for troponin 204 -> 379 and BNP 379.1. EKG showed sinus tach with sub-1 mm STE in I and aVL and sinus tachycardia. CT dissection protocol was negative for dissection.  Cath 2/10 with minimal CAD EF 20-25%. ? Possible takotsubo.  Echo 20-25% moderate MR. On way to cath developed VT. Started on amiodarone developed more bradycardia -> PMVT   Cardiogenic shock  Lidocaine stopped 2/2 N/V and failure to suppress arrhythmias NSPMVT (long-short sequenced)  Dobutamine gtt 1.25 Mexiletine 200mg  BID  Assessment & Plan    Recurrent NSPMVT Occasional PVCs On mexiletine Keep K+ and Mag supplemented  NICM Unclear etiology ? sarcoid Eventual MRI Planned for c.MRI today   Paroxysmal Afib (hx mentions an atypical flutter as well) AFlutter with 1:1 and 2:1 conduction intermittently last night and again this AM, all self terminated CHA2DS2Vasc 4, on Eliquis appropriately dosed D/w HF team APP Planned to stop dobutamine today, hopefully this simmers down her rate Otherwise will add amio if more off the pressor Add BB when able  She presented in AFlutter on admission 2/9, EKG on 2/10 and 2/11 are  SR Tele maintained SR until yesterday AM about 0645 On eliquis > heparin > Eliquis I think we could safely DCCV/add amio if needed     For questions or updates, please contact [facility] Please consult [email or web address] for contact info under        Signed, Baldwin Jamaica, PA-C  04/28/2021, 8:58 AM

## 2021-04-28 NOTE — Progress Notes (Addendum)
Advanced Heart Failure Rounding Note   Subjective:    On DBA 1.25. Co-ox 64%.   PVCs better suppressed 0-10 PVCs/min   Several runs of 1:1 Aflutter this morning 180s-190s that broke spontaneously, symptomatic w/ palpitations and dyspnea. Still in Aflutter w/ CVR 80s w/ PACs and PVCs   K 4.6 Mg 2.6   Volume status ok CVP 6. SCr stable 1.35   Currently feels well. No current dyspnea. No chest pain. Main complaint is constipation   Objective:   Weight Range:  Vital Signs:   Temp:  [97.9 F (36.6 C)-98.6 F (37 C)] 98.4 F (36.9 C) (02/14 0400) Pulse Rate:  [61-200] 72 (02/14 0700) Resp:  [13-35] 20 (02/14 0700) BP: (92-147)/(46-112) 99/71 (02/14 0700) SpO2:  [86 %-100 %] 99 % (02/14 0700) Last BM Date : 04/23/21  Weight change: Filed Weights   04/23/21 1900 04/24/21 0015 04/27/21 0425  Weight: 95.3 kg 92 kg 88.3 kg    Intake/Output:   Intake/Output Summary (Last 24 hours) at 04/28/2021 0174 Last data filed at 04/28/2021 0400 Gross per 24 hour  Intake 234.53 ml  Output 500 ml  Net -265.47 ml    PHYSICAL EXAM: CVP 6  General:  Well appearing. No respiratory difficulty HEENT: normal Neck: supple. no JVD. Carotids 2+ bilat; no bruits. No lymphadenopathy or thyromegaly appreciated. Cor: PMI nondisplaced. Irregular rhythm and rate, No rubs, gallops or murmurs. Lungs: clear Abdomen: soft, nontender, nondistended. No hepatosplenomegaly. No bruits or masses. Good bowel sounds. Extremities: no cyanosis, clubbing, rash, edema + RUE PICC  Neuro: alert & oriented x 3, cranial nerves grossly intact. moves all 4 extremities w/o difficulty. Affect pleasant.    Telemetry: Atrial Flutter 80s w/  PVCs burden ~ 10/min, several runs of Aflutter w/ RVR 190s-200s  Personally reviewed   Labs: Basic Metabolic Panel: Recent Labs  Lab 04/24/21 0249 04/24/21 1913 04/25/21 2217 04/25/21 2230 04/26/21 0737 04/27/21 0403 04/28/21 0425  NA 136 137 136   139 138 136 138 138   K 4.1 3.9 2.8*   2.8* 2.9* 3.2* 3.7 4.6  CL 100 95* 93*  --  93* 100 101  CO2 22 31 31   --  30 27 28   GLUCOSE 132* 117* 252*  --  171* 139* 156*  BUN 14 13 14   --  15 18 24*  CREATININE 1.23* 1.28* 1.40*  --  1.43* 1.39* 1.35*  CALCIUM 9.2 9.4 8.8*  --  8.8* 9.1 9.3  MG 2.0  --  1.8  --  1.9 2.6* 2.6*    Liver Function Tests: Recent Labs  Lab 04/23/21 2144 04/25/21 2217  AST 58* 36  ALT 24 20  ALKPHOS 90 89  BILITOT 0.6 0.8  PROT 6.8 6.8  ALBUMIN 3.7 3.6   No results for input(s): LIPASE, AMYLASE in the last 168 hours. No results for input(s): AMMONIA in the last 168 hours.  CBC: Recent Labs  Lab 04/25/21 0549 04/25/21 2217 04/25/21 2230 04/26/21 0737 04/27/21 0403 04/28/21 0425  WBC 6.5 6.9  --  8.6 7.8 8.9  HGB 12.3 12.0   13.3 13.6 12.4 12.4 12.6  HCT 38.3 37.6   39.0 40.0 38.3 37.4 39.2  MCV 93.0 91.9  --  91.0 90.1 91.6  PLT 147* 136*  --  142* 142* 155    Cardiac Enzymes: No results for input(s): CKTOTAL, CKMB, CKMBINDEX, TROPONINI in the last 168 hours.  BNP: BNP (last 3 results) Recent Labs    04/23/21 2144  BNP 379.1*    ProBNP (last 3 results) No results for input(s): PROBNP in the last 8760 hours.    Other results:  Imaging: No results found.   Medications:     Scheduled Medications:  apixaban  5 mg Oral BID   aspirin EC  81 mg Oral Daily   Chlorhexidine Gluconate Cloth  6 each Topical Daily   dapagliflozin propanediol  10 mg Oral Daily   insulin aspart  0-9 Units Subcutaneous TID WC   levothyroxine  200 mcg Oral QAC breakfast   LORazepam  1 mg Oral On Call   mouth rinse  15 mL Mouth Rinse BID   melatonin  3 mg Oral QHS   mexiletine  200 mg Oral Q12H   pantoprazole  40 mg Oral Daily   rosuvastatin  40 mg Oral Daily   senna-docusate  2 tablet Oral BID   sodium chloride flush  10-40 mL Intracatheter Q12H   spironolactone  12.5 mg Oral Daily    Infusions:  sodium chloride Stopped (04/25/21 1526)   DOBUTamine 1.25  mcg/kg/min (04/28/21 0400)   methylPREDNISolone (SOLU-MEDROL) injection Stopped (04/27/21 1027)   norepinephrine (LEVOPHED) Adult infusion Stopped (04/26/21 1039)    PRN Medications: acetaminophen, lidocaine (PF), nitroGLYCERIN, ondansetron (ZOFRAN) IV, sodium chloride flush   Assessment/Plan:   1.  Acute on chronic systolic HF due to NICM -> cardiogenic shock - 5/22 ECHO EF 30-35% - Cath 2/23 minimal CAD. hstroponin 204 -> 379 - Echo 2/23: EF 20-25% - Now on DBA 1.25. Co-ox 64%. Volume ok on exam. CVP 6  - Stop DBA and follow co-ox  - Continue spiro 12.5 mg daily  - Continue Farxiga 10 mg daily  - Add losartan 12.5 mg qhs  - echo does not look like Tako-tsubo despite presentation. With ectopy could be Giant Cell but time course and only mild hsTn elevation doesn't fit.Has h/o PVCs - On chest CT but there is LV apical scar in the absence of significant epicardial CAD on cath so suspect this is likely sarcoid though there is no evidence of extra-cardiac sarcoid on chest CT. Differential for cardiomyopathy also includes PVC-mediated, giant cell (unlikely).  - Continue mexiletine and pulse-dose steroids - plan cMRI today    2. PVCs/NSVT/PMVT - EP following.  - did not tolerate amio due to worsening bradycardia - off lido due to n/v that said PVC burden much decreased with lido - trial of mexilitene 200 bid - pulse dose steroids for possible cardiac sarcoid - ACE level normal (52 U/L)  - Keep K> 4.0 Mg > 2.0    3. Paroxysmal AF/ Atrial Flutter w/ RVR  - Several runs of 1:1 AFL today w/ RVR - remains in rate controlled AFL, reviewed EKGs w/ EP  - Stop DBA - Continue apixaban 5 mg bid - May need TEE/DCCV +/- retrial of amio  - Keep K > 4.0 and Mg >2.0   4. Hypokalemia - resolved, K 4.6 today  - continue spiro   5. Constipation  - advance bowel regimen, add suppository  - sorbitol if needed    Length of Stay: 5   Brittainy Simmons PA-C  04/28/2021, 8:22 AM  Advanced  Heart Failure Team Pager [phone] (M-F; 7a - 4p)  Please contact [facility] for night-coverage after hours (4p -7a ) and weekends on [email or web address]   Patient seen with PA, agree with the above note.   She remains in atrial flutter rate 90s.  Earlier she had 1:1 AFL that resolved.  Co-ox 64% today, CVP 6.    No complaints currently.   General: NAD Neck: No JVD, no thyromegaly or thyroid nodule.  Lungs: Clear to auscultation bilaterally with normal respiratory effort. CV: Nondisplaced PMI.  Heart irregular S1/S2, no S3/S4, no murmur. 1+ ankle edema.   Abdomen: Soft, nontender, no hepatosplenomegaly, no distention.  Skin: Intact without lesions or rashes.  Neurologic: Alert and oriented x 3.  Psych: Normal affect. Extremities: No clubbing or cyanosis.  HEENT: Normal.   Patient is back in atrial flutter currently.  She was 1:1 with rate around 150 earlier, now slower in 90s.  She was in NSR yesterday.  - Stop dobutamine, hopefully she will go back into NSR.  On her own off dobutamine.  If not, need to consider addition of amiodarone +/- DCCV.  - Continue Eliquis.  - Not on digoxin due to bradycardia when in NSR.  - Not on amiodarone due to bradycardial when in NSR.    Frequent PVCs.  Improved on mexiletine.   Cardiomyopathy => ?PVC mediated, ?isolated cardiac sarcoidosis (does not have signs of pulmonary sarcoidosis), ?myocarditis.  She is on Solumedrol. CVP 6.  On dobutamine 1, off NE. Co-ox 66%.  - Stopping dobutamine today.  - Hold diuretics.  - Continue dapagliflozin and spironolactone 12.5 daily.  - Add losartan 12.5 daily.  - Will get cardiac MRI to look for evidence of cardiac sarcoidosis, myocarditis.   Marca Ancona 04/28/2021 2:16 PM

## 2021-04-29 DIAGNOSIS — I480 Paroxysmal atrial fibrillation: Secondary | ICD-10-CM | POA: Diagnosis not present

## 2021-04-29 DIAGNOSIS — I5023 Acute on chronic systolic (congestive) heart failure: Secondary | ICD-10-CM | POA: Diagnosis not present

## 2021-04-29 DIAGNOSIS — I4892 Unspecified atrial flutter: Secondary | ICD-10-CM

## 2021-04-29 DIAGNOSIS — I4729 Other ventricular tachycardia: Secondary | ICD-10-CM | POA: Diagnosis not present

## 2021-04-29 DIAGNOSIS — R570 Cardiogenic shock: Secondary | ICD-10-CM | POA: Diagnosis not present

## 2021-04-29 LAB — BASIC METABOLIC PANEL
Anion gap: 7 (ref 5–15)
BUN: 29 mg/dL — ABNORMAL HIGH (ref 8–23)
CO2: 27 mmol/L (ref 22–32)
Calcium: 8.7 mg/dL — ABNORMAL LOW (ref 8.9–10.3)
Chloride: 102 mmol/L (ref 98–111)
Creatinine, Ser: 1.38 mg/dL — ABNORMAL HIGH (ref 0.44–1.00)
GFR, Estimated: 39 mL/min — ABNORMAL LOW (ref >60)
Glucose, Bld: 130 mg/dL — ABNORMAL HIGH (ref 70–99)
Potassium: 4.5 mmol/L (ref 3.5–5.1)
Sodium: 136 mmol/L (ref 135–145)

## 2021-04-29 LAB — COOXEMETRY PANEL
Carboxyhemoglobin: <2.2 % — ABNORMAL HIGH (ref 0.5–1.5)
Methemoglobin: 0.3 % (ref 0.0–1.5)
O2 Saturation: 68.4 %
Total hemoglobin: 12.9 g/dL (ref 12.0–16.0)

## 2021-04-29 LAB — CBC
HCT: 37.9 % (ref 36.0–46.0)
Hemoglobin: 12.7 g/dL (ref 12.0–15.0)
MCH: 30.3 pg (ref 26.0–34.0)
MCHC: 33.5 g/dL (ref 30.0–36.0)
MCV: 90.5 fL (ref 80.0–100.0)
Platelets: 135 10*3/uL — ABNORMAL LOW (ref 150–400)
RBC: 4.19 MIL/uL (ref 3.87–5.11)
RDW: 16.6 % — ABNORMAL HIGH (ref 11.5–15.5)
WBC: 6.2 10*3/uL (ref 4.0–10.5)
nRBC: 0 % (ref 0.0–0.2)

## 2021-04-29 LAB — GLUCOSE, CAPILLARY
Glucose-Capillary: 107 mg/dL — ABNORMAL HIGH (ref 70–99)
Glucose-Capillary: 114 mg/dL — ABNORMAL HIGH (ref 70–99)
Glucose-Capillary: 105 mg/dL — ABNORMAL HIGH (ref 70–99)
Glucose-Capillary: 123 mg/dL — ABNORMAL HIGH (ref 70–99)

## 2021-04-29 MED ORDER — AMIODARONE IV BOLUS ONLY 150 MG/100ML
150.0000 mg | Freq: Once | INTRAVENOUS | Status: AC
  Administered 2021-04-29: 150 mg via INTRAVENOUS
  Filled 2021-04-29: qty 100

## 2021-04-29 MED ORDER — SORBITOL 70 % SOLN
30.0000 mL | Freq: Once | Status: AC
  Administered 2021-04-29: 30 mL via ORAL
  Filled 2021-04-29: qty 30

## 2021-04-29 MED ORDER — PREDNISONE 20 MG PO TABS
40.0000 mg | ORAL_TABLET | Freq: Every day | ORAL | Status: DC
  Administered 2021-04-29: 40 mg via ORAL
  Filled 2021-04-29: qty 2

## 2021-04-29 NOTE — Plan of Care (Signed)

## 2021-04-29 NOTE — Progress Notes (Addendum)
Advanced Heart Failure Rounding Note   Subjective:    Has been in/out AFL. Now staying in AFL. Rate 80-100  DBA stopped. Co-ox 68%  Feeling better. Denies CP or SOB. C/o constipation   Objective:   Weight Range:  Vital Signs:   Temp:  [97.4 F (36.3 C)-98.3 F (36.8 C)] 98.3 F (36.8 C) (02/15 0724) Pulse Rate:  [51-109] 74 (02/15 0700) Resp:  [11-35] 14 (02/15 0700) BP: (84-119)/(44-99) 94/76 (02/15 0700) SpO2:  [78 %-100 %] 100 % (02/15 0700) Weight:  [88.6 kg] 88.6 kg (02/15 0500) Last BM Date : 04/23/21  Weight change: Filed Weights   04/24/21 0015 04/27/21 0425 04/29/21 0500  Weight: 92 kg 88.3 kg 88.6 kg    Intake/Output:   Intake/Output Summary (Last 24 hours) at 04/29/2021 4462 Last data filed at 04/28/2021 2217 Gross per 24 hour  Intake 10 ml  Output --  Net 10 ml     PHYSICAL EXAM: General:  Well appearing. No resp difficulty HEENT: normal Neck: supple. no JVD. Carotids 2+ bilat; no bruits. No lymphadenopathy or thryomegaly appreciated. Cor: PMI nondisplaced. Irregular rate & rhythm. No rubs, gallops or murmurs. Lungs: clear Abdomen: soft, nontender, nondistended. No hepatosplenomegaly. No bruits or masses. Good bowel sounds. Extremities: no cyanosis, clubbing, rash, edema Neuro: alert & orientedx3, cranial nerves grossly intact. moves all 4 extremities w/o difficulty. Affect pleasant   Telemetry: Atrial Flutter 80-100s + PVCs. Personally reviewed    Labs: Basic Metabolic Panel: Recent Labs  Lab 04/24/21 0249 04/24/21 1913 04/25/21 2217 04/25/21 2230 04/26/21 0737 04/27/21 0403 04/28/21 0425 04/29/21 0450  NA 136   < > 136   139 138 136 138 138 136  K 4.1   < > 2.8*   2.8* 2.9* 3.2* 3.7 4.6 4.5  CL 100   < > 93*  --  93* 100 101 102  CO2 22   < > 31  --  30 27 28 27   GLUCOSE 132*   < > 252*  --  171* 139* 156* 130*  BUN 14   < > 14  --  15 18 24* 29*  CREATININE 1.23*   < > 1.40*  --  1.43* 1.39* 1.35* 1.38*  CALCIUM 9.2   < >  8.8*  --  8.8* 9.1 9.3 8.7*  MG 2.0  --  1.8  --  1.9 2.6* 2.6*  --    < > = values in this interval not displayed.     Liver Function Tests: Recent Labs  Lab 04/23/21 2144 04/25/21 2217  AST 58* 36  ALT 24 20  ALKPHOS 90 89  BILITOT 0.6 0.8  PROT 6.8 6.8  ALBUMIN 3.7 3.6    No results for input(s): LIPASE, AMYLASE in the last 168 hours. No results for input(s): AMMONIA in the last 168 hours.  CBC: Recent Labs  Lab 04/25/21 2217 04/25/21 2230 04/26/21 0737 04/27/21 0403 04/28/21 0425 04/29/21 0450  WBC 6.9  --  8.6 7.8 8.9 6.2  HGB 12.0   13.3 13.6 12.4 12.4 12.6 12.7  HCT 37.6   39.0 40.0 38.3 37.4 39.2 37.9  MCV 91.9  --  91.0 90.1 91.6 90.5  PLT 136*  --  142* 142* 155 135*     Cardiac Enzymes: No results for input(s): CKTOTAL, CKMB, CKMBINDEX, TROPONINI in the last 168 hours.  BNP: BNP (last 3 results) Recent Labs    04/23/21 2144  BNP 379.1*     ProBNP (last 3  results) No results for input(s): PROBNP in the last 8760 hours.    Other results:  Imaging: MR CARDIAC MORPHOLOGY W WO CONTRAST  Result Date: 04/28/2021 CLINICAL DATA:  Clinical question of cardiomyopathy Study assumes HCT of 39 and BSA of 1.99 m2. EXAM: CARDIAC MRI TECHNIQUE: The patient was scanned on a 1.5 Tesla GE magnet. A dedicated cardiac coil was used. Functional imaging was done using Fiesta sequences. 2,3, and 4 chamber views were done to assess for RWMA's. Modified Simpson's rule using a short axis stack was used to calculate an ejection fraction on a dedicated work Conservation officer, nature. The patient received 10 cc of Gadavist. After 10 minutes inversion recovery sequences were used to assess for infiltration and scar tissue. CONTRAST:  10 cc  of Gadavist FINDINGS: 1. Mildly increased left ventricular size, with LVEDD 60 mm, and LVEDVi 98 mL/m2. Normal left ventricular thickness, with intraventricular septal thickness of 7 mm, posterior wall thickness of 7 mm, and myocardial  mass index of 54 g/m2. There is severely reduced LV function. Though there is notable artifact, LVEF by volumes 11%. There is global hypokinesis. During PVCs, atypical septal motion. No LV thrombus. Left ventricular parametric mapping notable for elevated ECV signal in the basal septum (37-40%) and mid septum (35%). Thought there is normal T2 signal by mapping, there is increased signal in the basal, anterolateral on T2 STIR. There is late gadolinium enhancement in the left ventricular myocardium: There is small mid myocardial LGE in the apical inferior. 2. Normal right ventricular size with RVEDVI 69 mL/m2. Normal right ventricular thickness. Moderate right ventricular systolic dysfunction (RVEF =35%). There are no regional wall motion abnormalities or aneurysms. 3. Normal left and right atrial size by MRI indexed volumes. Interatrial septum bows to the right. 4. Normal size of the aortic root, ascending aorta and pulmonary artery. 5. Valve assessment: Aortic Valve:Tri-leaflet aortic valve. Qualitatively no significant regurgitation. Pulmonic Valve: Qualitatively no significant regurgitation. Tricuspid Valve: Qualitatively mild regurgitation. Mitral Valve: Significant artifact. Qualitatively mild to moderate central and likely ventricular functional secondary regurgitation. 6.  Normal pericardium.  No pericardial effusion. 7. Grossly, no extracardiac findings. Recommended dedicated study if concerned for non-cardiac pathology. 8. Breath-hold Artifacts noted. This decreased the sensitivity of volumetric assessments. Much of the study is done free-breathing. Scanned at elevated heart rates. IMPRESSION: Severely reduced LV function There is apical LGE. Though the 2018 [facility] Criteria is met for myocarditis, areas of T2 elevated, LGE, and ECV signal are atypical for myocarditis. LGE is focal, not in the septum, and without RV involvement. This is less consistent with the pattern seen in cardiac  sarcoidosis. Study done free breathing at elevated heart rates. Rudean Haskell MD Electronically Signed   By: Rudean Haskell M.D.   On: 04/28/2021 17:51     Medications:     Scheduled Medications:  apixaban  5 mg Oral BID   aspirin EC  81 mg Oral Daily   Chlorhexidine Gluconate Cloth  6 each Topical Daily   dapagliflozin propanediol  10 mg Oral Daily   insulin aspart  0-9 Units Subcutaneous TID WC   levothyroxine  200 mcg Oral QAC breakfast   losartan  12.5 mg Oral QHS   mouth rinse  15 mL Mouth Rinse BID   melatonin  3 mg Oral QHS   mexiletine  200 mg Oral Q12H   pantoprazole  40 mg Oral Daily   polyethylene glycol  17 g Oral Daily   rosuvastatin  40  mg Oral Daily   senna-docusate  2 tablet Oral BID   sodium chloride flush  10-40 mL Intracatheter Q12H   spironolactone  12.5 mg Oral Daily    Infusions:  sodium chloride Stopped (04/25/21 1526)    PRN Medications: acetaminophen, lidocaine (PF), nitroGLYCERIN, ondansetron (ZOFRAN) IV, sodium chloride flush   Assessment/Plan:   1.  Acute on chronic systolic HF due to NICM -> cardiogenic shock - 5/22 ECHO EF 30-35% - Cath 2/23 minimal CAD. hstroponin 204 -> 379 - Echo 2/23: EF 20-25% - Now on DBA 1.25. Co-ox 64%. Volume ok on exam. CVP 6  - Stop DBA and follow co-ox  - Continue spiro 12.5 mg daily  - Continue Farxiga 10 mg daily  - Continue losartan 12.5 mg qhs  - On chest CT but there is LV apical scar in the absence of significant epicardial CAD on cath so suspect this is likely sarcoid though there is no evidence of extra-cardiac sarcoid on chest CT. Differential for cardiomyopathy also includes PVC-mediated, giant cell (unlikely).  - Continue mexiletine - has had pulse-dose solumedrol x 3 days. Will switch to oral prednisone - plan cMRI today    2. PVCs/NSVT/PMVT - EP following.  - did not tolerate amio due to worsening bradycardia - off lido due to n/v that said PVC burden much decreased with lido -  continue mexilitene 200 bid - has had pulse-dose solumedrol x 3 days. Will switch to oral prednisone - ACE level normal (52 U/L)  - Keep K> 4.0 Mg > 2.0    3. Paroxysmal AF/ Atrial Flutter w/ RVR  - remains in AFL rates 80-100  - Off DBA - Continue apixaban 5 mg bid - Discussed with EP. Will plan DC-CV (should not need TEE given short duration and has been on [facility]). If recurs will need to consider IV amio (if develops recurrent bradycardia would need a device) - Keep K > 4.0 and Mg >2.0  - Patient had a cup full of miralax just a few mins ago so will plan DC-CV tomorrow am. Give on dose IV amio.   4. Hypokalemia - resolved, K 4.5 today  - continue spiro   5. Constipation  - Soribtol   Length of Stay: 6  Glori Bickers MD 04/29/2021, 8:21 AM  Advanced Heart Failure Team Pager [phone] (M-F; 7a - 4p)  Please contact [facility] for night-coverage after hours (4p -7a ) and weekends on [email or web address]

## 2021-04-29 NOTE — Discharge Instructions (Addendum)
Supplemental Discharge Instructions for  Pacemaker/Defibrillator Patients  Activity No heavy lifting or vigorous activity with your left/right arm for 6 to 8 weeks.  Do not raise your left/right arm above your head for one week.  Gradually raise your affected arm as drawn below.             05/09/21         05/10/21          05/11/21         05/12/21 __  NO DRIVING for  1 week   ; you may begin driving on   [id number]  .  WOUND CARE Keep the wound area clean and dry.  Do not get this area wet , no showers for one week; you may shower on   05/12/21  . The tape/steri-strips on your wound will fall off; do not pull them off.  No bandage is needed on the site.  DO  NOT apply any creams, oils, or ointments to the wound area. If you notice any drainage or discharge from the wound, any swelling or bruising at the site, or you develop a fever > 101? F after you are discharged home, call the office at once.  Special Instructions You are still able to use cellular telephones; use the ear opposite the side where you have your pacemaker/defibrillator.  Avoid carrying your cellular phone near your device. When traveling through airports, show security personnel your identification card to avoid being screened in the metal detectors.  Ask the security personnel to use the hand wand. Avoid arc welding equipment, MRI testing (magnetic resonance imaging), TENS units (transcutaneous nerve stimulators).  Call the office for questions about other devices. Avoid electrical appliances that are in poor condition or are not properly grounded. Microwave ovens are safe to be near or to operate.  Additional information for defibrillator patients should your device go off: If your device goes off ONCE and you feel fine afterward, notify the device clinic nurses. If your device goes off ONCE and you do not feel well afterward, call 911. If your device goes off TWICE, call 911. If your device goes off THREE times in one  day, call 911.  DO NOT DRIVE YOURSELF OR A FAMILY MEMBER WITH A DEFIBRILLATOR TO THE HOSPITAL--CALL 911.     Information on my medicine - ELIQUIS (apixaban)  Why was Eliquis prescribed for you? Eliquis was prescribed for you to reduce the risk of a blood clot forming that can cause a stroke if you have a medical condition called atrial fibrillation (a type of irregular heartbeat).  What do You need to know about Eliquis ? Take your Eliquis TWICE DAILY - one tablet in the morning and one tablet in the evening with or without food. If you have difficulty swallowing the tablet whole please discuss with your pharmacist how to take the medication safely.  Take Eliquis exactly as prescribed by your doctor and DO NOT stop taking Eliquis without talking to the doctor who prescribed the medication.  Stopping may increase your risk of developing a stroke.  Refill your prescription before you run out.  After discharge, you should have regular check-up appointments with your healthcare provider that is prescribing your Eliquis.  In the future your dose may need to be changed if your kidney function or weight changes by a significant amount or as you get older.  What do you do if you miss a dose? If you miss a dose, take  it as soon as you remember on the same day and resume taking twice daily.  Do not take more than one dose of ELIQUIS at the same time to make up a missed dose.  Important Safety Information A possible side effect of Eliquis is bleeding. You should call your healthcare provider right away if you experience any of the following: Bleeding from an injury or your nose that does not stop. Unusual colored urine (red or dark brown) or unusual colored stools (red or black). Unusual bruising for unknown reasons. A serious fall or if you hit your head (even if there is no bleeding).  Some medicines may interact with Eliquis and might increase your risk of bleeding or clotting while  on Eliquis. To help avoid this, consult your healthcare provider or pharmacist prior to using any new prescription or non-prescription medications, including herbals, vitamins, non-steroidal anti-inflammatory drugs (NSAIDs) and supplements.  This website has more information on Eliquis (apixaban): http://www.eliquis.com/eliquis/home

## 2021-04-29 NOTE — Progress Notes (Signed)
CARDIAC REHAB PHASE I   Went to offer to walk with pt. Pt sitting in recliner getting ready to eat lunch. Pt denies questions or concerns about her stay or plan at this time. Will f/u as time and pts schedule  allows.  [id number] Reynold Bowen, RN BSN 04/29/2021 12:03 PM

## 2021-04-29 NOTE — Progress Notes (Signed)
Progress Note  Patient Name: Sheri Morgan Date of Encounter: 04/29/2021  [facility] Cardiologist: Elouise Munroe, MD   Subjective   OOB, feels better today, "almost like myself"  Inpatient Medications    Scheduled Meds:  apixaban  5 mg Oral BID   aspirin EC  81 mg Oral Daily   Chlorhexidine Gluconate Cloth  6 each Topical Daily   dapagliflozin propanediol  10 mg Oral Daily   insulin aspart  0-9 Units Subcutaneous TID WC   levothyroxine  200 mcg Oral QAC breakfast   losartan  12.5 mg Oral QHS   mouth rinse  15 mL Mouth Rinse BID   melatonin  3 mg Oral QHS   mexiletine  200 mg Oral Q12H   pantoprazole  40 mg Oral Daily   polyethylene glycol  17 g Oral Daily   rosuvastatin  40 mg Oral Daily   senna-docusate  2 tablet Oral BID   sodium chloride flush  10-40 mL Intracatheter Q12H   spironolactone  12.5 mg Oral Daily   Continuous Infusions:  sodium chloride Stopped (04/25/21 1526)   PRN Meds: acetaminophen, lidocaine (PF), nitroGLYCERIN, ondansetron (ZOFRAN) IV, sodium chloride flush   Vital Signs    Vitals:   04/29/21 0600 04/29/21 0620 04/29/21 0700 04/29/21 0724  BP: _0   Pulse: (!) 58 68 74   Resp: _1 Temp:    98.3 F (36.8 C)  TempSrc:    Oral  SpO2: 97% 93% 100%   Weight:      Height:        Intake/Output Summary (Last 24 hours) at 04/29/2021 0728 Last data filed at 04/28/2021 2217 Gross per 24 hour  Intake 10 ml  Output --  Net 10 ml   Last 3 Weights 04/29/2021 04/27/2021 04/24/2021  Weight (lbs) 195 lb 5.2 oz 194 lb 10.7 oz 202 lb 13.2 oz  Weight (kg) 88.6 kg 88.3 kg 92 kg      Telemetry    AFib/flutter 50's-80's, PVC burden about the same, AFlutter w/RVR- Personally Reviewed  ECG      No new EKGs  03/28/21 AFlutter  193 AFlutter 150 AFlutter 2:1 100bpm AFlutter 186 - Personally Reviewed  Physical Exam   GEN: No acute distress.   Neck: No JVD Cardiac: irreg-irreg, no murmurs, rubs, or gallops.   Respiratory: CTA b/l. GI: Soft, nontender, non-distended  MS: No edema; No deformity. Neuro:  Nonfocal  Psych: Normal affect   Labs    High Sensitivity Troponin:   Recent Labs  Lab 04/23/21 1847 04/23/21 2144  TROPONINIHS 204* 379*     Chemistry Recent Labs  Lab 04/23/21 2144 04/24/21 0249 04/25/21 2217 04/25/21 2230 04/26/21 0737 04/27/21 0403 04/28/21 0425 04/29/21 0450  NA  --    < > 136   139   < > 136 138 138 136  K  --    < > 2.8*   2.8*   < > 3.2* 3.7 4.6 4.5  CL  --    < > 93*  --  93* 100 101 102  CO2  --    < > 31  --  _2 GLUCOSE  --    < > 252*  --  171* 139* 156* 130*  BUN  --    < > 14  --  15 18 24* 29*  CREATININE  --    < > 1.40*  --  1.43* 1.39* 1.35*  1.38*  CALCIUM  --    < > 8.8*  --  8.8* 9.1 9.3 8.7*  MG 2.1   < > 1.8  --  1.9 2.6* 2.6*  --   PROT 6.8  --  6.8  --   --   --   --   --   ALBUMIN 3.7  --  3.6  --   --   --   --   --   AST 58*  --  36  --   --   --   --   --   ALT 24  --  20  --   --   --   --   --   ALKPHOS 90  --  89  --   --   --   --   --   BILITOT 0.6  --  0.8  --   --   --   --   --   GFRNONAA  --    < > 39*  --  38* 39* 40* 39*  ANIONGAP  --    < > 12  --  _0 < > = values in this interval not displayed.    Lipids  Recent Labs  Lab 04/23/21 2146  CHOL 253*  TRIG 53  HDL 101  LDLCALC 141*  CHOLHDL 2.5    Hematology Recent Labs  Lab 04/27/21 0403 04/28/21 0425 04/29/21 0450  WBC 7.8 8.9 6.2  RBC 4.15 4.28 4.19  HGB 12.4 12.6 12.7  HCT 37.4 39.2 37.9  MCV 90.1 91.6 90.5  MCH 29.9 29.4 30.3  MCHC 33.2 32.1 33.5  RDW 16.3* 16.3* 16.6*  PLT 142* 155 135*   Thyroid  Recent Labs  Lab 04/23/21 2146  TSH 3.737    BNP Recent Labs  Lab 04/23/21 2144  BNP 379.1*    DDimer No results for input(s): DDIMER in the last 168 hours.   Radiology    DG Chest Port 1 View  Result Date: 04/25/2021 CLINICAL DATA:  Non STEMI. EXAM: PORTABLE CHEST 1 VIEW COMPARISON:  Chest radiograph dated  04/25/2021. FINDINGS: Right-sided PICC in similar position. No focal consolidation, pleural effusion, pneumothorax. Stable cardiomegaly. No acute osseous pathology. IMPRESSION: 1. No acute cardiopulmonary process. 2. Stable cardiomegaly. Electronically Signed   By: Anner Crete M.D.   On: 04/25/2021 22:50   DG CHEST PORT 1 VIEW  Result Date: 04/25/2021 CLINICAL DATA:  PICC placement. EXAM: PORTABLE CHEST 1 VIEW COMPARISON:  Chest radiographs 12/18/2020 and CTA 04/23/2021 FINDINGS: A right PICC has been placed, the tip of which is difficult to visualize due to superimposition of the spine and telemetry leads, however it likely is located in the lower SVC. The cardiac silhouette remains mildly enlarged. No airspace consolidation, edema, pleural effusion, pneumothorax is identified. No acute osseous abnormality is seen. IMPRESSION: Right PICC placement as above. Electronically Signed   By: Logan Bores M.D.   On: 04/25/2021 12:18    Cardiac Studies    04/28/21: c.MRI IMPRESSION: Severely reduced LV function (11%)   There is apical LGE.   Though the 2018 [facility] Criteria is met for myocarditis, areas of T2 elevated, LGE, and ECV signal are atypical for myocarditis.   LGE is focal, not in the septum, and without RV involvement. This is less consistent with the pattern seen in cardiac sarcoidosis.   Study done free breathing at elevated heart rates.   04/24/21: TTE IMPRESSIONS  1. No left ventricular thrombus is seen. Left ventricular ejection  fraction, by estimation, is 20 to 25%. The left ventricle has severely  decreased function. The left ventricle demonstrates global hypokinesis.  The left ventricular internal cavity size  was mildly dilated. Left ventricular diastolic function could not be  evaluated.   2. Right ventricular systolic function is moderately reduced. The right  ventricular size is mildly enlarged. There is moderately elevated  pulmonary artery  systolic pressure. The estimated right ventricular  systolic pressure is 93.2 mmHg.   3. Left atrial size was severely dilated.   4. The mitral valve is normal in structure. Mild to moderate mitral valve  regurgitation.   5. The aortic valve is tricuspid. There is mild thickening of the aortic  valve. Aortic valve regurgitation is not visualized. Aortic valve  sclerosis is present, with no evidence of aortic valve stenosis.   6. The inferior vena cava is normal in size with greater than 50%  respiratory variability, suggesting right atrial pressure of 3 mmHg.   Comparison(s): Prior images reviewed side by side. The left ventricular  function is worsened.    04/23/21: LHC   Prox LAD lesion is 30% stenosed.   There is moderate left ventricular systolic dysfunction.   LV end diastolic pressure is severely elevated.   The left ventricular ejection fraction is 35-45% by visual estimate.   Mild non-obstructive CAD Elevated LVEDP LV wall motion abnormality noted with hand injection. (no power injection of the LV given elevated filling pressures). This may represent a Takotsubo's cardiomyopathy.  Ventricular tachycardia prior to cath.    Patient Profile     76 y.o. adult w/PMHx of Fibromyalgia, HTN, Afib  Admitted 2/9 with CP and worsening SOB. Labs notable for troponin 204 -> 379 and BNP 379.1. EKG showed sinus tach with sub-1 mm STE in I and aVL and sinus tachycardia. CT dissection protocol was negative for dissection.  Cath 2/10 with minimal CAD EF 20-25%. ? Possible takotsubo.  Echo 20-25% moderate MR. On way to cath developed VT. Started on amiodarone developed more bradycardia -> PMVT   Cardiogenic shock  Lidocaine stopped 2/2 N/V and failure to suppress arrhythmias NSPMVT (long-short sequenced)  Dobutamine gtt 1.25 Mexiletine 213m BID  Assessment & Plan    Recurrent NSPMVT Occasional PVCs On mexiletine Keep K+ and Mag supplemented  NICM Unclear etiology MRI  noted  Paroxysmal Afib (hx mentions an atypical flutter as well) AFlutter with 1:1 and 2:1 conduction intermittently last night and again this AM, all self terminated CHA2DS2Vasc 4, on Eliquis appropriately dosed D/w HF team APP Off dobutamine 04/28/21 She had a couple shorter episodes of RVR yesterday, otherwise rates good, 50's-60's overnight  She presented in AFlutter on admission 2/9, EKG on 2/10 and 2/11 are SR Tele maintained SR until 04/27/21 AM about 0645 and has been in AFlutter since On eliquis > heparin > Eliquis   Dr. LQuentin Orehas seen the patiet this AM Would TEE/DCCV when felt able, though defer timing to AHF team     For questions or updates, please contact CNorwayPlease consult [email or web address] for contact info under        Signed, RBaldwin Jamaica PA-C  04/29/2021, 7:28 AM

## 2021-04-29 NOTE — Plan of Care (Signed)
Per MD plan is for DCCV tomorrow. Problem: Education: Goal: Knowledge of General Education information will improve Description: Including pain rating scale, medication(s)/side effects and non-pharmacologic comfort measures Outcome: Progressing   Problem: Health Behavior/Discharge Planning: Goal: Ability to manage health-related needs will improve Outcome: Progressing   Problem: Clinical Measurements: Goal: Ability to maintain clinical measurements within normal limits will improve Outcome: Progressing Goal: Diagnostic test results will improve Outcome: Progressing Goal: Cardiovascular complication will be avoided Outcome: Progressing   Problem: Activity: Goal: Risk for activity intolerance will decrease Outcome: Progressing   Problem: Nutrition: Goal: Adequate nutrition will be maintained Outcome: Progressing   Problem: Coping: Goal: Level of anxiety will decrease Outcome: Progressing   Problem: Elimination: Goal: Will not experience complications related to bowel motility Outcome: Progressing   Problem: Pain Managment: Goal: General experience of comfort will improve Outcome: Progressing   Problem: Safety: Goal: Ability to remain free from injury will improve Outcome: Progressing   Problem: Skin Integrity: Goal: Risk for impaired skin integrity will decrease Outcome: Progressing

## 2021-04-30 ENCOUNTER — Encounter (HOSPITAL_COMMUNITY)
Admission: EM | Disposition: A | Discharge status: Home / Self Care | Payer: Self-pay | Source: Home / Self Care | Attending: Internal Medicine

## 2021-04-30 ENCOUNTER — Inpatient Hospital Stay (HOSPITAL_COMMUNITY): Payer: Medicare Other | Admitting: Anesthesiology

## 2021-04-30 ENCOUNTER — Encounter (HOSPITAL_COMMUNITY): Payer: Self-pay | Admitting: Internal Medicine

## 2021-04-30 DIAGNOSIS — I4729 Other ventricular tachycardia: Secondary | ICD-10-CM | POA: Diagnosis not present

## 2021-04-30 DIAGNOSIS — I130 Hypertensive heart and chronic kidney disease with heart failure and stage 1 through stage 4 chronic kidney disease, or unspecified chronic kidney disease: Secondary | ICD-10-CM

## 2021-04-30 DIAGNOSIS — I252 Old myocardial infarction: Secondary | ICD-10-CM

## 2021-04-30 DIAGNOSIS — I4892 Unspecified atrial flutter: Secondary | ICD-10-CM

## 2021-04-30 DIAGNOSIS — I480 Paroxysmal atrial fibrillation: Secondary | ICD-10-CM | POA: Diagnosis not present

## 2021-04-30 DIAGNOSIS — N189 Chronic kidney disease, unspecified: Secondary | ICD-10-CM

## 2021-04-30 DIAGNOSIS — I5022 Chronic systolic (congestive) heart failure: Secondary | ICD-10-CM | POA: Diagnosis not present

## 2021-04-30 DIAGNOSIS — I509 Heart failure, unspecified: Secondary | ICD-10-CM

## 2021-04-30 HISTORY — PX: CARDIOVERSION: SHX1299

## 2021-04-30 LAB — BASIC METABOLIC PANEL
Anion gap: 8 (ref 5–15)
BUN: 30 mg/dL — ABNORMAL HIGH (ref 8–23)
CO2: 28 mmol/L (ref 22–32)
Calcium: 8.6 mg/dL — ABNORMAL LOW (ref 8.9–10.3)
Chloride: 102 mmol/L (ref 98–111)
Creatinine, Ser: 1.36 mg/dL — ABNORMAL HIGH (ref 0.44–1.00)
GFR, Estimated: 40 mL/min — ABNORMAL LOW (ref >60)
Glucose, Bld: 94 mg/dL (ref 70–99)
Potassium: 4.1 mmol/L (ref 3.5–5.1)
Sodium: 138 mmol/L (ref 135–145)

## 2021-04-30 LAB — GLUCOSE, CAPILLARY
Glucose-Capillary: 102 mg/dL — ABNORMAL HIGH (ref 70–99)
Glucose-Capillary: 116 mg/dL — ABNORMAL HIGH (ref 70–99)
Glucose-Capillary: 95 mg/dL (ref 70–99)
Glucose-Capillary: 125 mg/dL — ABNORMAL HIGH (ref 70–99)

## 2021-04-30 LAB — COOXEMETRY PANEL
Carboxyhemoglobin: <2.2 % — ABNORMAL HIGH (ref 0.5–1.5)
Methemoglobin: 0.3 % (ref 0.0–1.5)
O2 Saturation: 65.2 %
Total hemoglobin: 13 g/dL (ref 12.0–16.0)

## 2021-04-30 LAB — MAGNESIUM: Magnesium: 2.7 mg/dL — ABNORMAL HIGH (ref 1.7–2.4)

## 2021-04-30 SURGERY — CARDIOVERSION
Anesthesia: General

## 2021-04-30 MED ORDER — PHENYLEPHRINE HCL (PRESSORS) 10 MG/ML IV SOLN
INTRAVENOUS | Status: DC | PRN
  Administered 2021-04-30: 80 ug via INTRAVENOUS
  Administered 2021-04-30 (×2): 160 ug via INTRAVENOUS

## 2021-04-30 MED ORDER — PREDNISONE 20 MG PO TABS
30.0000 mg | ORAL_TABLET | Freq: Every day | ORAL | Status: DC
  Administered 2021-04-30 – 2021-05-11 (×12): 30 mg via ORAL
  Filled 2021-04-30 (×12): qty 1

## 2021-04-30 MED ORDER — FOLIC ACID 1 MG PO TABS
1.0000 mg | ORAL_TABLET | Freq: Every day | ORAL | Status: DC
  Administered 2021-04-30 – 2021-05-11 (×11): 1 mg via ORAL
  Filled 2021-04-30 (×11): qty 1

## 2021-04-30 MED ORDER — NOREPINEPHRINE 4 MG/250ML-% IV SOLN: 0.0000 ug/min | INTRAVENOUS | Status: DC

## 2021-04-30 MED ORDER — SENNOSIDES-DOCUSATE SODIUM 8.6-50 MG PO TABS: 1.0000 | ORAL_TABLET | Freq: Every evening | ORAL | Status: DC | PRN

## 2021-04-30 MED ORDER — SULFAMETHOXAZOLE-TRIMETHOPRIM 800-160 MG PO TABS
1.0000 | ORAL_TABLET | ORAL | Status: DC
  Administered 2021-05-01 – 2021-05-06 (×3): 1 via ORAL
  Filled 2021-04-30 (×3): qty 1

## 2021-04-30 MED ORDER — CALCIUM CITRATE 950 (200 CA) MG PO TABS
200.0000 mg | ORAL_TABLET | Freq: Every day | ORAL | Status: DC
  Administered 2021-05-01 – 2021-05-06 (×6): 200 mg via ORAL
  Filled 2021-04-30 (×6): qty 1

## 2021-04-30 MED ORDER — PHENYLEPHRINE HCL-NACL 20-0.9 MG/250ML-% IV SOLN: 0.0000 ug/min | INTRAVENOUS | Status: DC

## 2021-04-30 MED ORDER — METHOTREXATE 2.5 MG PO TABS
10.0000 mg | ORAL_TABLET | ORAL | Status: AC
  Administered 2021-04-30 – 2021-05-07 (×2): 10 mg via ORAL
  Filled 2021-04-30 (×2): qty 4

## 2021-04-30 MED ORDER — NAPHAZOLINE-GLYCERIN 0.012-0.25 % OP SOLN
1.0000 [drp] | Freq: Four times a day (QID) | OPHTHALMIC | Status: DC | PRN
  Administered 2021-05-04: 2 [drp] via OPHTHALMIC
  Filled 2021-04-30 (×2): qty 15

## 2021-04-30 MED ORDER — VITAMIN D 25 MCG (1000 UNIT) PO TABS
1000.0000 [IU] | ORAL_TABLET | Freq: Every day | ORAL | Status: DC
  Administered 2021-05-01 – 2021-05-11 (×10): 1000 [IU] via ORAL
  Filled 2021-04-30 (×10): qty 1

## 2021-04-30 MED ORDER — PROPOFOL 10 MG/ML IV BOLUS
INTRAVENOUS | Status: DC | PRN
  Administered 2021-04-30: 50 mg via INTRAVENOUS

## 2021-04-30 MED ORDER — POLYETHYLENE GLYCOL 3350 17 G PO PACK
17.0000 g | PACK | Freq: Every day | ORAL | Status: DC
  Administered 2021-05-01 – 2021-05-05 (×3): 17 g via ORAL
  Filled 2021-04-30 (×6): qty 1

## 2021-04-30 MED ORDER — MIDODRINE HCL 5 MG PO TABS
5.0000 mg | ORAL_TABLET | Freq: Three times a day (TID) | ORAL | Status: DC
  Administered 2021-04-30: 5 mg via ORAL
  Filled 2021-04-30: qty 1

## 2021-04-30 MED ORDER — PREDNISONE 20 MG PO TABS: 30.0000 mg | ORAL_TABLET | Freq: Every day | ORAL | Status: DC

## 2021-04-30 NOTE — Transfer of Care (Signed)
Immediate Anesthesia Transfer of Care Note  Patient: Sheri Morgan  Procedure(s) Performed: CARDIOVERSION  Patient Location: PACU and Endoscopy Unit  Anesthesia Type:General  Level of Consciousness: drowsy  Airway & Oxygen Therapy: Patient Spontanous Breathing  Post-op Assessment: Report given to RN and Post -op Vital signs reviewed and stable  Post vital signs: Reviewed and stable  Last Vitals:  Vitals Value Taken Time  BP    Temp    Pulse    Resp    SpO2      Last Pain:  Vitals:   04/30/21 0740  TempSrc: Temporal  PainSc: 0-No pain      Patients Stated Pain Goal: 2 (04/29/21 0939)  Complications: No notable events documented.

## 2021-04-30 NOTE — CV Procedure (Signed)
° °   DIRECT CURRENT CARDIOVERSION  NAME:  Sheri Morgan   MRN: [id number] DOB:  14-Feb-1946   ADMIT DATE: 04/23/2021   INDICATIONS: Atrial flutter   PROCEDURE:   Informed consent was obtained prior to the procedure. The risks, benefits and alternatives for the procedure were discussed and the patient comprehended these risks. Once an appropriate time out was taken, the patient had the defibrillator pads placed in the anterior and posterior position. The patients flutter sped up to 195 priro to sedation. I slowed her down with carotid massage. She then underwent sedation by the anesthesia service. Once an appropriate level of sedation was achieved, the patient received a single biphasic, synchronized 150J shock with prompt conversion to sinus rhythm with frequent PACs/PVCs.. No apparent complications.  Arvilla Meres, MD  8:47 AM

## 2021-04-30 NOTE — Progress Notes (Signed)
Advanced Heart Failure Rounding Note   Subjective:    She remains in AFL. Rate in 90s. On apixaban. No bleeding  Off DBA co-ox 65%  Denies SOB, orthopnea or PND.   Objective:   Weight Range:  Vital Signs:   Temp:  [97 F (36.1 C)-98.3 F (36.8 C)] 97.1 F (36.2 C) (02/16 0740) Pulse Rate:  [56-99] 85 (02/16 0740) Resp:  [12-39] 18 (02/16 0740) BP: (79-110)/(54-86) 86/62 (02/16 0740) SpO2:  [90 %-100 %] 100 % (02/16 0740) Weight:  [88.5 kg] 88.5 kg (02/16 0740) Last BM Date : 04/29/21  Weight change: Filed Weights   04/29/21 0500 04/30/21 0500 04/30/21 0740  Weight: 88.6 kg 88.5 kg 88.5 kg    Intake/Output:   Intake/Output Summary (Last 24 hours) at 04/30/2021 0756 Last data filed at 04/30/2021 0500 Gross per 24 hour  Intake 611.96 ml  Output --  Net 611.96 ml     PHYSICAL EXAM: General:  Well appearing. No resp difficulty HEENT: normal Neck: supple. no JVD. Carotids 2+ bilat; no bruits. No lymphadenopathy or thryomegaly appreciated. Cor: PMI nondisplaced. Regular rate & rhythm. No rubs, gallops or murmurs. Lungs: clear Abdomen: soft, nontender, nondistended. No hepatosplenomegaly. No bruits or masses. Good bowel sounds. Extremities: no cyanosis, clubbing, rash, edema Neuro: alert & orientedx3, cranial nerves grossly intact. moves all 4 extremities w/o difficulty. Affect pleasant   Telemetry: Atrial Flutter 90sPersonally reviewed    Labs: Basic Metabolic Panel: Recent Labs  Lab 04/25/21 2217 04/25/21 2230 04/26/21 0737 04/27/21 0403 04/28/21 0425 04/29/21 0450 04/30/21 0524  NA 136   139   < > 136 138 138 136 138  K 2.8*   2.8*   < > 3.2* 3.7 4.6 4.5 4.1  CL 93*  --  93* 100 101 102 102  CO2 31  --  _0 GLUCOSE 252*  --  171* 139* 156* 130* 94  BUN 14  --  15 18 24* 29* 30*  CREATININE 1.40*  --  1.43* 1.39* 1.35* 1.38* 1.36*  CALCIUM 8.8*  --  8.8* 9.1 9.3 8.7* 8.6*  MG 1.8  --  1.9 2.6* 2.6*  --  2.7*   < > = values in this  interval not displayed.     Liver Function Tests: Recent Labs  Lab 04/23/21 2144 04/25/21 2217  AST 58* 36  ALT 24 20  ALKPHOS 90 89  BILITOT 0.6 0.8  PROT 6.8 6.8  ALBUMIN 3.7 3.6    No results for input(s): LIPASE, AMYLASE in the last 168 hours. No results for input(s): AMMONIA in the last 168 hours.  CBC: Recent Labs  Lab 04/25/21 2217 04/25/21 2230 04/26/21 0737 04/27/21 0403 04/28/21 0425 04/29/21 0450  WBC 6.9  --  8.6 7.8 8.9 6.2  HGB 12.0   13.3 13.6 12.4 12.4 12.6 12.7  HCT 37.6   39.0 40.0 38.3 37.4 39.2 37.9  MCV 91.9  --  91.0 90.1 91.6 90.5  PLT 136*  --  142* 142* 155 135*     Cardiac Enzymes: No results for input(s): CKTOTAL, CKMB, CKMBINDEX, TROPONINI in the last 168 hours.  BNP: BNP (last 3 results) Recent Labs    04/23/21 2144  BNP 379.1*     ProBNP (last 3 results) No results for input(s): PROBNP in the last 8760 hours.    Other results:  Imaging: MR CARDIAC MORPHOLOGY W WO CONTRAST  Result Date: 04/28/2021 CLINICAL DATA:  Clinical question of cardiomyopathy Study  assumes HCT of 39 and BSA of 1.99 m2. EXAM: CARDIAC MRI TECHNIQUE: The patient was scanned on a 1.5 Tesla GE magnet. A dedicated cardiac coil was used. Functional imaging was done using Fiesta sequences. 2,3, and 4 chamber views were done to assess for RWMA's. Modified Simpson's rule using a short axis stack was used to calculate an ejection fraction on a dedicated work station using Circle software. The patient received 10 cc of Gadavist. After 10 minutes inversion recovery sequences were used to assess for infiltration and scar tissue. CONTRAST:  10 cc  of Gadavist FINDINGS: 1. Mildly increased left ventricular size, with LVEDD 60 mm, and LVEDVi 98 mL/m2. Normal left ventricular thickness, with intraventricular septal thickness of 7 mm, posterior wall thickness of 7 mm, and myocardial mass index of 54 g/m2. There is severely reduced LV function. Though there is notable  artifact, LVEF by volumes 11%. There is global hypokinesis. During PVCs, atypical septal motion. No LV thrombus. Left ventricular parametric mapping notable for elevated ECV signal in the basal septum (37-40%) and mid septum (35%). Thought there is normal T2 signal by mapping, there is increased signal in the basal, anterolateral on T2 STIR. There is late gadolinium enhancement in the left ventricular myocardium: There is small mid myocardial LGE in the apical inferior. 2. Normal right ventricular size with RVEDVI 69 mL/m2. Normal right ventricular thickness. Moderate right ventricular systolic dysfunction (RVEF =35%). There are no regional wall motion abnormalities or aneurysms. 3. Normal left and right atrial size by MRI indexed volumes. Interatrial septum bows to the right. 4. Normal size of the aortic root, ascending aorta and pulmonary artery. 5. Valve assessment: Aortic Valve:Tri-leaflet aortic valve. Qualitatively no significant regurgitation. Pulmonic Valve: Qualitatively no significant regurgitation. Tricuspid Valve: Qualitatively mild regurgitation. Mitral Valve: Significant artifact. Qualitatively mild to moderate central and likely ventricular functional secondary regurgitation. 6.  Normal pericardium.  No pericardial effusion. 7. Grossly, no extracardiac findings. Recommended dedicated study if concerned for non-cardiac pathology. 8. Breath-hold Artifacts noted. This decreased the sensitivity of volumetric assessments. Much of the study is done free-breathing. Scanned at elevated heart rates. IMPRESSION: Severely reduced LV function There is apical LGE. Though the 2018 Modified [place] Criteria is met for myocarditis, areas of T2 elevated, LGE, and ECV signal are atypical for myocarditis. LGE is focal, not in the septum, and without RV involvement. This is less consistent with the pattern seen in cardiac sarcoidosis. Study done free breathing at elevated heart rates. Mahesh Chandrasekhar MD  Electronically Signed   By: Mahesh   Chandrasekhar M.D.   On: 04/28/2021 17:51   ° ° °Medications:   ° ° °Scheduled Medications: ° [MAR Hold] apixaban  5 mg Oral BID  ° [MAR Hold] aspirin EC  81 mg Oral Daily  ° [MAR Hold] Chlorhexidine Gluconate Cloth  6 each Topical Daily  ° [MAR Hold] dapagliflozin propanediol  10 mg Oral Daily  ° [MAR Hold] insulin aspart  0-9 Units Subcutaneous TID WC  ° [MAR Hold] levothyroxine  200 mcg Oral QAC breakfast  ° [MAR Hold] losartan  12.5 mg Oral QHS  ° [MAR Hold] melatonin  3 mg Oral QHS  ° [MAR Hold] mexiletine  200 mg Oral Q12H  ° [MAR Hold] pantoprazole  40 mg Oral Daily  ° [MAR Hold] polyethylene glycol  17 g Oral Daily  ° [MAR Hold] predniSONE  40 mg Oral Q breakfast  ° [MAR Hold] rosuvastatin  40 mg Oral Daily  ° [MAR Hold] sodium chloride flush    10-40 mL Intracatheter Q12H   [MAR Hold] spironolactone  12.5 mg Oral Daily    Infusions:  sodium chloride Stopped (04/25/21 1526)    PRN Medications: [MAR Hold] acetaminophen, lidocaine (PF), [MAR Hold] nitroGLYCERIN, [MAR Hold] ondansetron (ZOFRAN) IV, [MAR Hold] senna-docusate, [MAR Hold] sodium chloride flush   Assessment/Plan:   1.  Acute on chronic systolic HF due to NICM -> cardiogenic shock. Suspect cardiac sarcoid - 5/22 ECHO EF 30-35% - Cath 2/23 minimal CAD. hstroponin 204 -> 379 - Echo 2/23: EF 20-25% - Off DBA. Co-ox stable at 65% - Continue spiro 12.5 mg daily  - Continue Farxiga 10 mg daily  - Continue losartan 12.5 mg qhs  - BP too low to titrate GDMT  - cMRI EF 11% RVEF 15% LGE pattern concerning for sarcoid. (D/w Dr. Gardiner Rhyme)  - Continue mexiletine - Had pulse-dose steroids. Now on prednisone.  - Will need to initiate full sarcoid protocol today with MTX/folic acid/PJP prophylaxis   2. PVCs/NSVT/PMVT - EP following.  - did not tolerate amio due to worsening bradycardia - off lido due to n/v that said PVC burden much decreased with lido - continue mexilitene 200 bid - on steroids.  Plan as above - ACE level normal (52 U/L)  - Keep K> 4.0 Mg > 2.0    3. Paroxysmal AF/ Atrial Flutter w/ RVR  - remains in AFL rates 80-100  - Off DBA - Continue apixaban 5 mg bid - Will plan DC-CV this am. If recurs will need to consider IV amio (if develops recurrent bradycardia would need a device) - Keep K > 4.0 and Mg >2.0   4. Hypokalemia - resolved. K 4.1 today   5. Constipation  - Soribtol   Length of Stay: 7  Esteven Overfelt MD 04/30/2021, 7:56 AM  Advanced Heart Failure Team Pager [phone] (M-F; [place])  Please contact [facility] for night-coverage after hours (4p -7a ) and weekends on [email or web address]

## 2021-04-30 NOTE — Interval H&P Note (Signed)
History and Physical Interval Note:  04/30/2021 8:02 AM  Sheri Morgan  has presented today for surgery, with the diagnosis of atrial flutter.  The various methods of treatment have been discussed with the patient and family. After consideration of risks, benefits and other options for treatment, the patient has consented to  Procedure(s): CARDIOVERSION (N/A) as a surgical intervention.  The patient's history has been reviewed, patient examined, no change in status, stable for surgery.  I have reviewed the patient's chart and labs.  Questions were answered to the patient's satisfaction.     [name]

## 2021-04-30 NOTE — H&P (View-Only) (Signed)
Advanced Heart Failure Rounding Note   Subjective:    She remains in AFL. Rate in 90s. On apixaban. No bleeding  Off DBA co-ox 65%  Denies SOB, orthopnea or PND.   Objective:   Weight Range:  Vital Signs:   Temp:  [97 F (36.1 C)-98.3 F (36.8 C)] 97.1 F (36.2 C) (02/16 0740) Pulse Rate:  [56-99] 85 (02/16 0740) Resp:  [12-39] 18 (02/16 0740) BP: (79-110)/(54-86) 86/62 (02/16 0740) SpO2:  [90 %-100 %] 100 % (02/16 0740) Weight:  [88.5 kg] 88.5 kg (02/16 0740) Last BM Date : 04/29/21  Weight change: Filed Weights   04/29/21 0500 04/30/21 0500 04/30/21 0740  Weight: 88.6 kg 88.5 kg 88.5 kg    Intake/Output:   Intake/Output Summary (Last 24 hours) at 04/30/2021 0756 Last data filed at 04/30/2021 0500 Gross per 24 hour  Intake 611.96 ml  Output --  Net 611.96 ml     PHYSICAL EXAM: General:  Well appearing. No resp difficulty HEENT: normal Neck: supple. no JVD. Carotids 2+ bilat; no bruits. No lymphadenopathy or thryomegaly appreciated. Cor: PMI nondisplaced. Regular rate & rhythm. No rubs, gallops or murmurs. Lungs: clear Abdomen: soft, nontender, nondistended. No hepatosplenomegaly. No bruits or masses. Good bowel sounds. Extremities: no cyanosis, clubbing, rash, edema Neuro: alert & orientedx3, cranial nerves grossly intact. moves all 4 extremities w/o difficulty. Affect pleasant   Telemetry: Atrial Flutter 90sPersonally reviewed    Labs: Basic Metabolic Panel: Recent Labs  Lab 04/25/21 2217 04/25/21 2230 04/26/21 0737 04/27/21 0403 04/28/21 0425 04/29/21 0450 04/30/21 0524  NA 136   139   < > 136 138 138 136 138  K 2.8*   2.8*   < > 3.2* 3.7 4.6 4.5 4.1  CL 93*  --  93* 100 101 102 102  CO2 31  --  _0 GLUCOSE 252*  --  171* 139* 156* 130* 94  BUN 14  --  15 18 24* 29* 30*  CREATININE 1.40*  --  1.43* 1.39* 1.35* 1.38* 1.36*  CALCIUM 8.8*  --  8.8* 9.1 9.3 8.7* 8.6*  MG 1.8  --  1.9 2.6* 2.6*  --  2.7*   < > = values in this  interval not displayed.     Liver Function Tests: Recent Labs  Lab 04/23/21 2144 04/25/21 2217  AST 58* 36  ALT 24 20  ALKPHOS 90 89  BILITOT 0.6 0.8  PROT 6.8 6.8  ALBUMIN 3.7 3.6    No results for input(s): LIPASE, AMYLASE in the last 168 hours. No results for input(s): AMMONIA in the last 168 hours.  CBC: Recent Labs  Lab 04/25/21 2217 04/25/21 2230 04/26/21 0737 04/27/21 0403 04/28/21 0425 04/29/21 0450  WBC 6.9  --  8.6 7.8 8.9 6.2  HGB 12.0   13.3 13.6 12.4 12.4 12.6 12.7  HCT 37.6   39.0 40.0 38.3 37.4 39.2 37.9  MCV 91.9  --  91.0 90.1 91.6 90.5  PLT 136*  --  142* 142* 155 135*     Cardiac Enzymes: No results for input(s): CKTOTAL, CKMB, CKMBINDEX, TROPONINI in the last 168 hours.  BNP: BNP (last 3 results) Recent Labs    04/23/21 2144  BNP 379.1*     ProBNP (last 3 results) No results for input(s): PROBNP in the last 8760 hours.    Other results:  Imaging: MR CARDIAC MORPHOLOGY W WO CONTRAST  Result Date: 04/28/2021 CLINICAL DATA:  Clinical question of cardiomyopathy Study  assumes HCT of 39 and BSA of 1.99 m2. EXAM: CARDIAC MRI TECHNIQUE: The patient was scanned on a 1.5 Tesla GE magnet. A dedicated cardiac coil was used. Functional imaging was done using Fiesta sequences. 2,3, and 4 chamber views were done to assess for RWMA's. Modified Simpson's rule using a short axis stack was used to calculate an ejection fraction on a dedicated work Conservation officer, nature. The patient received 10 cc of Gadavist. After 10 minutes inversion recovery sequences were used to assess for infiltration and scar tissue. CONTRAST:  10 cc  of Gadavist FINDINGS: 1. Mildly increased left ventricular size, with LVEDD 60 mm, and LVEDVi 98 mL/m2. Normal left ventricular thickness, with intraventricular septal thickness of 7 mm, posterior wall thickness of 7 mm, and myocardial mass index of 54 g/m2. There is severely reduced LV function. Though there is notable  artifact, LVEF by volumes 11%. There is global hypokinesis. During PVCs, atypical septal motion. No LV thrombus. Left ventricular parametric mapping notable for elevated ECV signal in the basal septum (37-40%) and mid septum (35%). Thought there is normal T2 signal by mapping, there is increased signal in the basal, anterolateral on T2 STIR. There is late gadolinium enhancement in the left ventricular myocardium: There is small mid myocardial LGE in the apical inferior. 2. Normal right ventricular size with RVEDVI 69 mL/m2. Normal right ventricular thickness. Moderate right ventricular systolic dysfunction (RVEF =35%). There are no regional wall motion abnormalities or aneurysms. 3. Normal left and right atrial size by MRI indexed volumes. Interatrial septum bows to the right. 4. Normal size of the aortic root, ascending aorta and pulmonary artery. 5. Valve assessment: Aortic Valve:Tri-leaflet aortic valve. Qualitatively no significant regurgitation. Pulmonic Valve: Qualitatively no significant regurgitation. Tricuspid Valve: Qualitatively mild regurgitation. Mitral Valve: Significant artifact. Qualitatively mild to moderate central and likely ventricular functional secondary regurgitation. 6.  Normal pericardium.  No pericardial effusion. 7. Grossly, no extracardiac findings. Recommended dedicated study if concerned for non-cardiac pathology. 8. Breath-hold Artifacts noted. This decreased the sensitivity of volumetric assessments. Much of the study is done free-breathing. Scanned at elevated heart rates. IMPRESSION: Severely reduced LV function There is apical LGE. Though the 2018 [facility] Criteria is met for myocarditis, areas of T2 elevated, LGE, and ECV signal are atypical for myocarditis. LGE is focal, not in the septum, and without RV involvement. This is less consistent with the pattern seen in cardiac sarcoidosis. Study done free breathing at elevated heart rates. Rudean Haskell MD  Electronically Signed   By: Rudean Haskell M.D.   On: 04/28/2021 17:51     Medications:     Scheduled Medications:  [MAR Hold] apixaban  5 mg Oral BID   [MAR Hold] aspirin EC  81 mg Oral Daily   [MAR Hold] Chlorhexidine Gluconate Cloth  6 each Topical Daily   [MAR Hold] dapagliflozin propanediol  10 mg Oral Daily   [MAR Hold] insulin aspart  0-9 Units Subcutaneous TID WC   [MAR Hold] levothyroxine  200 mcg Oral QAC breakfast   [MAR Hold] losartan  12.5 mg Oral QHS   [MAR Hold] melatonin  3 mg Oral QHS   [MAR Hold] mexiletine  200 mg Oral Q12H   [MAR Hold] pantoprazole  40 mg Oral Daily   [MAR Hold] polyethylene glycol  17 g Oral Daily   [MAR Hold] predniSONE  40 mg Oral Q breakfast   [MAR Hold] rosuvastatin  40 mg Oral Daily   [MAR Hold] sodium chloride flush  10-40 mL Intracatheter Q12H   [MAR Hold] spironolactone  12.5 mg Oral Daily    Infusions:  sodium chloride Stopped (04/25/21 1526)    PRN Medications: [MAR Hold] acetaminophen, lidocaine (PF), [MAR Hold] nitroGLYCERIN, [MAR Hold] ondansetron (ZOFRAN) IV, [MAR Hold] senna-docusate, [MAR Hold] sodium chloride flush   Assessment/Plan:   1.  Acute on chronic systolic HF due to NICM -> cardiogenic shock. Suspect cardiac sarcoid - 5/22 ECHO EF 30-35% - Cath 2/23 minimal CAD. hstroponin 204 -> 379 - Echo 2/23: EF 20-25% - Off DBA. Co-ox stable at 65% - Continue spiro 12.5 mg daily  - Continue Farxiga 10 mg daily  - Continue losartan 12.5 mg qhs  - BP too low to titrate GDMT  - cMRI EF 11% RVEF 15% LGE pattern concerning for sarcoid. (D/w Dr. Gardiner Rhyme)  - Continue mexiletine - Had pulse-dose steroids. Now on prednisone.  - Will need to initiate full sarcoid protocol today with MTX/folic acid/PJP prophylaxis   2. PVCs/NSVT/PMVT - EP following.  - did not tolerate amio due to worsening bradycardia - off lido due to n/v that said PVC burden much decreased with lido - continue mexilitene 200 bid - on steroids.  Plan as above - ACE level normal (52 U/L)  - Keep K> 4.0 Mg > 2.0    3. Paroxysmal AF/ Atrial Flutter w/ RVR  - remains in AFL rates 80-100  - Off DBA - Continue apixaban 5 mg bid - Will plan DC-CV this am. If recurs will need to consider IV amio (if develops recurrent bradycardia would need a device) - Keep K > 4.0 and Mg >2.0   4. Hypokalemia - resolved. K 4.1 today   5. Constipation  - Soribtol   Length of Stay: 7  Gerold Sar MD 04/30/2021, 7:56 AM  Advanced Heart Failure Team Pager [phone] (M-F; [place])  Please contact [facility] for night-coverage after hours (4p -7a ) and weekends on [email or web address]

## 2021-04-30 NOTE — Progress Notes (Signed)
Patient with consistently low BP on left arm.  Cuff changed to left leg and BP now 124/58 with a map of 74.  Dr. Gala Romney made aware of difference in BP on leg vs. Left arm.

## 2021-04-30 NOTE — Anesthesia Postprocedure Evaluation (Signed)
Anesthesia Post Note  Patient: Sheri Morgan  Procedure(s) Performed: CARDIOVERSION     Patient location during evaluation: PACU Anesthesia Type: General Level of consciousness: awake and alert Pain management: pain level controlled Vital Signs Assessment: post-procedure vital signs reviewed and stable Respiratory status: spontaneous breathing, nonlabored ventilation, respiratory function stable and patient connected to nasal cannula oxygen Cardiovascular status: blood pressure returned to baseline and stable Postop Assessment: no apparent nausea or vomiting Anesthetic complications: no   No notable events documented.  Last Vitals:  Vitals:   04/30/21 0835 04/30/21 0842  BP: (!) 84/29 (!) 90/44  Pulse: (!) 48 (!) 44  Resp: 13 17  Temp:    SpO2: 100% 98%    Last Pain:  Vitals:   04/30/21 0842  TempSrc:   PainSc: 0-No pain                 Kennieth Rad

## 2021-04-30 NOTE — Progress Notes (Signed)
° °Progress Note ° °Patient Name: Sheri Morgan °Date of Encounter: 04/30/2021 ° °[facility] Cardiologist: Gayatri A Acharya, MD  ° °Subjective  ° °Chest feels "quieter", no CP, no SOB ° °Inpatient Medications  °  °Scheduled Meds: ° apixaban  5 mg Oral BID  ° aspirin EC  81 mg Oral Daily  ° Chlorhexidine Gluconate Cloth  6 each Topical Daily  ° dapagliflozin propanediol  10 mg Oral Daily  ° insulin aspart  0-9 Units Subcutaneous TID WC  ° levothyroxine  200 mcg Oral QAC breakfast  ° losartan  12.5 mg Oral QHS  ° melatonin  3 mg Oral QHS  ° mexiletine  200 mg Oral Q12H  ° midodrine  5 mg Oral TID WC  ° pantoprazole  40 mg Oral Daily  ° [START ON 05/01/2021] polyethylene glycol  17 g Oral Daily  ° [START ON 05/01/2021] predniSONE  30 mg Oral Q breakfast  ° rosuvastatin  40 mg Oral Daily  ° sodium chloride flush  10-40 mL Intracatheter Q12H  ° spironolactone  12.5 mg Oral Daily  ° °Continuous Infusions: ° ° °PRN Meds: °acetaminophen, nitroGLYCERIN, ondansetron (ZOFRAN) IV, senna-docusate, sodium chloride flush  ° °Vital Signs  °  °Vitals:  ° 04/30/21 0822 04/30/21 0832 04/30/21 0835 04/30/21 0842  °BP: (!) 82/48 (!) 83/47 (!) 84/29 (!) 90/44  °Pulse: (!) 56 (!) 49 (!) 48 (!) 44  °Resp: 16 13 13 17  °Temp: (!) 96.4 °F (35.8 °C)     °TempSrc: Temporal     °SpO2: 98% 99% 100% 98%  °Weight:      °Height:      ° ° °Intake/Output Summary (Last 24 hours) at 04/30/2021 0951 °Last data filed at 04/30/2021 0500 °Gross per 24 hour  °Intake 491.96 ml  °Output --  °Net 491.96 ml  ° °Last 3 Weights 04/30/2021 04/30/2021 04/29/2021  °Weight (lbs) 195 lb 1.7 oz 195 lb 1.7 oz 195 lb 5.2 oz  °Weight (kg) 88.5 kg 88.5 kg 88.6 kg  °   ° °Telemetry  °  °S/p DCCV this AM, SB 50's, 1st degree Avblock, occ PACs, infrequent PVCs post DCCV- Personally Reviewed ° °ECG  °  °SB 53bpm, 1st degree AVblock, 218ms - Personally Reviewed ° °Physical Exam  ° °GEN: No acute distress.   °Neck: No JVD °Cardiac: RRR, bradycardic, no murmurs, rubs, or gallops.   °Respiratory: CTA b/l. °GI: Soft, nontender, non-distended  °MS: No edema; No deformity. °Neuro:  Nonfocal  °Psych: Normal affect  ° °Labs  °  °High Sensitivity Troponin:   °Recent Labs  °Lab 04/23/21 °1847 04/23/21 °2144  °TROPONINIHS 204* 379*  °   °Chemistry °Recent Labs  °Lab 04/23/21 °2144 04/24/21 °0249 04/25/21 °2217 04/25/21 °2230 04/27/21 °0403 04/28/21 °0425 04/29/21 °0450 04/30/21 °0524  °NA  --    < > 136   139   < > 138 138 136 138  °K  --    < > 2.8*   2.8*   < > 3.7 4.6 4.5 4.1  °CL  --    < > 93*   < > 100 101 102 102  °CO2  --    < > 31   < > 27 28 27 28  °GLUCOSE  --    < > 252*   < > 139* 156* 130* 94  °BUN  --    < > 14   < > 18 24* 29* 30*  °CREATININE  --    < >   1.40*   < > 1.39* 1.35* 1.38* 1.36*  CALCIUM  --    < > 8.8*   < > 9.1 9.3 8.7* 8.6*  MG 2.1   < > 1.8   < > 2.6* 2.6*  --  2.7*  PROT 6.8  --  6.8  --   --   --   --   --   ALBUMIN 3.7  --  3.6  --   --   --   --   --   AST 58*  --  36  --   --   --   --   --   ALT 24  --  20  --   --   --   --   --   ALKPHOS 90  --  89  --   --   --   --   --   BILITOT 0.6  --  0.8  --   --   --   --   --   GFRNONAA  --    < > 39*   < > 39* 40* 39* 40*  ANIONGAP  --    < > 12   < > _0 < > = values in this interval not displayed.    Lipids  Recent Labs  Lab 04/23/21 2146  CHOL 253*  TRIG 53  HDL 101  LDLCALC 141*  CHOLHDL 2.5    Hematology Recent Labs  Lab 04/27/21 0403 04/28/21 0425 04/29/21 0450  WBC 7.8 8.9 6.2  RBC 4.15 4.28 4.19  HGB 12.4 12.6 12.7  HCT 37.4 39.2 37.9  MCV 90.1 91.6 90.5  MCH 29.9 29.4 30.3  MCHC 33.2 32.1 33.5  RDW 16.3* 16.3* 16.6*  PLT 142* 155 135*   Thyroid  Recent Labs  Lab 04/23/21 2146  TSH 3.737    BNP Recent Labs  Lab 04/23/21 2144  BNP 379.1*    DDimer No results for input(s): DDIMER in the last 168 hours.   Radiology    DG Chest Port 1 View  Result Date: 04/25/2021 CLINICAL DATA:  Non STEMI. EXAM: PORTABLE CHEST 1 VIEW COMPARISON:  Chest radiograph dated  04/25/2021. FINDINGS: Right-sided PICC in similar position. No focal consolidation, pleural effusion, pneumothorax. Stable cardiomegaly. No acute osseous pathology. IMPRESSION: 1. No acute cardiopulmonary process. 2. Stable cardiomegaly. Electronically Signed   By: Anner Crete M.D.   On: 04/25/2021 22:50   DG CHEST PORT 1 VIEW  Result Date: 04/25/2021 CLINICAL DATA:  PICC placement. EXAM: PORTABLE CHEST 1 VIEW COMPARISON:  Chest radiographs 12/18/2020 and CTA 04/23/2021 FINDINGS: A right PICC has been placed, the tip of which is difficult to visualize due to superimposition of the spine and telemetry leads, however it likely is located in the lower SVC. The cardiac silhouette remains mildly enlarged. No airspace consolidation, edema, pleural effusion, pneumothorax is identified. No acute osseous abnormality is seen. IMPRESSION: Right PICC placement as above. Electronically Signed   By: Logan Bores M.D.   On: 04/25/2021 12:18    Cardiac Studies    04/28/21: c.MRI IMPRESSION: Severely reduced LV function (11%)   There is apical LGE.   Though the 2018 [facility] Criteria is met for myocarditis, areas of T2 elevated, LGE, and ECV signal are atypical for myocarditis.   LGE is focal, not in the septum, and without RV involvement. This is less consistent with the pattern seen in cardiac sarcoidosis.   Study done  free breathing at elevated heart rates.   04/24/21: TTE IMPRESSIONS   1. No left ventricular thrombus is seen. Left ventricular ejection  fraction, by estimation, is 20 to 25%. The left ventricle has severely  decreased function. The left ventricle demonstrates global hypokinesis.  The left ventricular internal cavity size  was mildly dilated. Left ventricular diastolic function could not be  evaluated.   2. Right ventricular systolic function is moderately reduced. The right  ventricular size is mildly enlarged. There is moderately elevated  pulmonary artery  systolic pressure. The estimated right ventricular  systolic pressure is 98.9 mmHg.   3. Left atrial size was severely dilated.   4. The mitral valve is normal in structure. Mild to moderate mitral valve  regurgitation.   5. The aortic valve is tricuspid. There is mild thickening of the aortic  valve. Aortic valve regurgitation is not visualized. Aortic valve  sclerosis is present, with no evidence of aortic valve stenosis.   6. The inferior vena cava is normal in size with greater than 50%  respiratory variability, suggesting right atrial pressure of 3 mmHg.   Comparison(s): Prior images reviewed side by side. The left ventricular  function is worsened.    04/23/21: LHC   Prox LAD lesion is 30% stenosed.   There is moderate left ventricular systolic dysfunction.   LV end diastolic pressure is severely elevated.   The left ventricular ejection fraction is 35-45% by visual estimate.   Mild non-obstructive CAD Elevated LVEDP LV wall motion abnormality noted with hand injection. (no power injection of the LV given elevated filling pressures). This may represent a Takotsubo's cardiomyopathy.  Ventricular tachycardia prior to cath.    Patient Profile     76 y.o. adult w/PMHx of Fibromyalgia, HTN, Afib  Admitted 2/9 with CP and worsening SOB. Labs notable for troponin 204 -> 379 and BNP 379.1. EKG showed sinus tach with sub-1 mm STE in I and aVL and sinus tachycardia. CT dissection protocol was negative for dissection.  Cath 2/10 with minimal CAD EF 20-25%. ? Possible takotsubo.  Echo 20-25% moderate MR. On way to cath developed VT. Started on amiodarone developed more bradycardia -> PMVT   Cardiogenic shock  Lidocaine stopped 2/2 N/V and failure to suppress arrhythmias NSPMVT (long-short sequenced)  Dobutamine gtt 1.25 Mexiletine 244m BID  Assessment & Plan    Recurrent NSPMVT Occasional PVCs On mexiletine Keep K+ and Mag supplemented  NICM Unclear etiology MRI  noted  Paroxysmal Afib (hx mentions an atypical flutter as well) AFlutter with 1:1 and 2:1 conduction intermittently last night and again this AM, all self terminated CHA2DS2Vasc 4, on Eliquis appropriately dosed DCCV this AM > SB 50's  6, hypotension Started on midodrine by HF team    Dr. LQuentin Orewill see later today Hopefully her rhythm holds, suspect if she needs amio she will need pacing support      For questions or updates, please contact CIredellPlease consult [email or web address] for contact info under        Signed, RBaldwin Jamaica PA-C  04/30/2021, 9:51 AM

## 2021-04-30 NOTE — Anesthesia Preprocedure Evaluation (Signed)
Anesthesia Evaluation  Patient identified by MRN, date of birth, ID band Patient awake    Reviewed: Allergy & Precautions, NPO status , Patient's Chart, lab work & pertinent test results  Airway Mallampati: II  TM Distance: >3 FB Neck ROM: Full    Dental   Pulmonary neg pulmonary ROS,    breath sounds clear to auscultation       Cardiovascular hypertension, + Past MI and +CHF   Rhythm:Irregular Rate:Normal  EF 20-25%   Neuro/Psych negative neurological ROS     GI/Hepatic negative GI ROS, Neg liver ROS,   Endo/Other  Hypothyroidism   Renal/GU CRFRenal disease     Musculoskeletal   Abdominal   Peds  Hematology negative hematology ROS (+)   Anesthesia Other Findings   Reproductive/Obstetrics                             Anesthesia Physical Anesthesia Plan  ASA: 3  Anesthesia Plan: General   Post-op Pain Management: Minimal or no pain anticipated   Induction: Intravenous  PONV Risk Score and Plan: 3 and Treatment may vary due to age or medical condition  Airway Management Planned: Natural Airway and Mask  Additional Equipment:   Intra-op Plan:   Post-operative Plan:   Informed Consent: I have reviewed the patients History and Physical, chart, labs and discussed the procedure including the risks, benefits and alternatives for the proposed anesthesia with the patient or authorized representative who has indicated his/her understanding and acceptance.       Plan Discussed with: CRNA  Anesthesia Plan Comments:         Anesthesia Quick Evaluation

## 2021-05-01 ENCOUNTER — Encounter (HOSPITAL_COMMUNITY): Payer: Self-pay | Admitting: Internal Medicine

## 2021-05-01 DIAGNOSIS — I4892 Unspecified atrial flutter: Secondary | ICD-10-CM | POA: Diagnosis not present

## 2021-05-01 DIAGNOSIS — I4729 Other ventricular tachycardia: Secondary | ICD-10-CM | POA: Diagnosis not present

## 2021-05-01 DIAGNOSIS — I480 Paroxysmal atrial fibrillation: Secondary | ICD-10-CM | POA: Diagnosis not present

## 2021-05-01 DIAGNOSIS — I214 Non-ST elevation (NSTEMI) myocardial infarction: Secondary | ICD-10-CM | POA: Diagnosis not present

## 2021-05-01 LAB — COMPREHENSIVE METABOLIC PANEL
ALT: 17 U/L (ref 0–44)
AST: 17 U/L (ref 15–41)
Albumin: 2.8 g/dL — ABNORMAL LOW (ref 3.5–5.0)
Alkaline Phosphatase: 71 U/L (ref 38–126)
Anion gap: 8 (ref 5–15)
BUN: 27 mg/dL — ABNORMAL HIGH (ref 8–23)
CO2: 27 mmol/L (ref 22–32)
Calcium: 8.6 mg/dL — ABNORMAL LOW (ref 8.9–10.3)
Chloride: 102 mmol/L (ref 98–111)
Creatinine, Ser: 1.17 mg/dL — ABNORMAL HIGH (ref 0.44–1.00)
GFR, Estimated: 48 mL/min — ABNORMAL LOW (ref >60)
Glucose, Bld: 126 mg/dL — ABNORMAL HIGH (ref 70–99)
Potassium: 4.5 mmol/L (ref 3.5–5.1)
Sodium: 137 mmol/L (ref 135–145)
Total Bilirubin: 0.4 mg/dL (ref 0.3–1.2)
Total Protein: 5.7 g/dL — ABNORMAL LOW (ref 6.5–8.1)

## 2021-05-01 LAB — CBC
HCT: 35.5 % — ABNORMAL LOW (ref 36.0–46.0)
Hemoglobin: 11.9 g/dL — ABNORMAL LOW (ref 12.0–15.0)
MCH: 30.4 pg (ref 26.0–34.0)
MCHC: 33.5 g/dL (ref 30.0–36.0)
MCV: 90.6 fL (ref 80.0–100.0)
Platelets: 128 10*3/uL — ABNORMAL LOW (ref 150–400)
RBC: 3.92 MIL/uL (ref 3.87–5.11)
RDW: 16.7 % — ABNORMAL HIGH (ref 11.5–15.5)
WBC: 5.2 10*3/uL (ref 4.0–10.5)
nRBC: 0 % (ref 0.0–0.2)

## 2021-05-01 LAB — GLUCOSE, CAPILLARY
Glucose-Capillary: 132 mg/dL — ABNORMAL HIGH (ref 70–99)
Glucose-Capillary: 98 mg/dL (ref 70–99)
Glucose-Capillary: 125 mg/dL — ABNORMAL HIGH (ref 70–99)
Glucose-Capillary: 139 mg/dL — ABNORMAL HIGH (ref 70–99)

## 2021-05-01 LAB — COOXEMETRY PANEL
Carboxyhemoglobin: 1.4 % (ref 0.5–1.5)
Methemoglobin: 1 % (ref 0.0–1.5)
O2 Saturation: 65.3 %
Total hemoglobin: 12.6 g/dL (ref 12.0–16.0)

## 2021-05-01 MED ORDER — LOSARTAN POTASSIUM 25 MG PO TABS
25.0000 mg | ORAL_TABLET | Freq: Every day | ORAL | Status: DC
  Administered 2021-05-01: 25 mg via ORAL
  Filled 2021-05-01: qty 1

## 2021-05-01 NOTE — Evaluation (Signed)
Occupational Therapy Evaluation Patient Details Name: Sheri Morgan MRN: [id number] DOB: 04/10/1945 Today's Date: 05/01/2021   History of Present Illness 76 y.o. female who presented 04/23/21 with x2 week hx of SOB and chest pain with exertion and then sudden chest pain at rest with nausea and emesis on 2/9. In route to cath lab, pt had stable VT. S/p LHC 2/9 which showed non-obstructive CAD with an elevated LVEDP and reduced EF. S/p cardioversion 2/16. Possibly cardiac sarcoid. PMH: atypical atrial flutter, fibromyalgia, HTN, HFrEF, Afib   Clinical Impression   PTA, pt was living with her son and was independent. Currently, pt performing ADLs and functional mobility at Mod I level. Provided education on safe tub transfer and fall prevention; pt demonstrated understanding. Answered all pt questions. Recommend dc home once medically stable per physician. All acute OT needs met and will sign off. Thank you.      Recommendations for follow up therapy are one component of a multi-disciplinary discharge planning process, led by the attending physician.  Recommendations may be updated based on patient status, additional functional criteria and insurance authorization.   Follow Up Recommendations  No OT follow up    Assistance Recommended at Discharge PRN  Patient can return home with the following      Functional Status Assessment  Patient has had a recent decline in their functional status and demonstrates the ability to make significant improvements in function in a reasonable and predictable amount of time.  Equipment Recommendations  None recommended by OT    Recommendations for Other Services       Precautions / Restrictions Precautions Precautions: Fall Precaution Comments: low risk Restrictions Weight Bearing Restrictions: No      Mobility Bed Mobility               General bed mobility comments: In recliner    Transfers Overall transfer level: Modified  independent                        Balance Overall balance assessment: Mild deficits observed, not formally tested                                         ADL either performed or assessed with clinical judgement   ADL Overall ADL's : Modified independent                                       General ADL Comments: Pt performing ADLs and functional mobility with Mod I - increased time as needed. Feel she will progress well.     Vision         Perception     Praxis      Pertinent Vitals/Pain Pain Assessment Pain Assessment: Faces Faces Pain Scale: Hurts a little bit Pain Location: bil anterior thighs Pain Descriptors / Indicators: Tightness Pain Intervention(s): Monitored during session, Limited activity within patient's tolerance, Repositioned     Hand Dominance Right   Extremity/Trunk Assessment Upper Extremity Assessment Upper Extremity Assessment: Overall WFL for tasks assessed   Lower Extremity Assessment Lower Extremity Assessment: Overall WFL for tasks assessed   Cervical / Trunk Assessment Cervical / Trunk Assessment: Normal   Communication Communication Communication: No difficulties   Cognition Arousal/Alertness: Awake/alert Behavior During Therapy: WFL for tasks assessed/performed  Overall Cognitive Status: Within Functional Limits for tasks assessed                                       General Comments  VSS on RA    Exercises     Shoulder Instructions      Home Living Family/patient expects to be discharged to:: Private residence Living Arrangements: Children (adult son) Available Help at Discharge: Family Type of Home: House Home Access: Stairs to enter Technical brewer of Steps: 1 Entrance Stairs-Rails: None Home Layout: One level     Bathroom Shower/Tub: Teacher, early years/pre: Standard (handicapped in other bathroom)     Home Equipment: None           Prior Functioning/Environment Prior Level of Function : Independent/Modified Independent;Driving             Mobility Comments: x1 fall off rolling chair 1x due to "probably was too close to the edge of it and slipped off", but otherwise no falls. No AD. ADLs Comments: Does own shopping, cooking, cleaning etc        OT Problem List: Decreased range of motion;Decreased activity tolerance      OT Treatment/Interventions:      OT Goals(Current goals can be found in the care plan section) Acute Rehab OT Goals Patient Stated Goal: Go home soon OT Goal Formulation: All assessment and education complete, DC therapy  OT Frequency:      Co-evaluation              AM-PAC OT "6 Clicks" Daily Activity     Outcome Measure Help from another person eating meals?: None Help from another person taking care of personal grooming?: None Help from another person toileting, which includes using toliet, bedpan, or urinal?: None Help from another person bathing (including washing, rinsing, drying)?: None Help from another person to put on and taking off regular upper body clothing?: None Help from another person to put on and taking off regular lower body clothing?: None 6 Click Score: 24   End of Session Nurse Communication: Mobility status  Activity Tolerance: Patient tolerated treatment well Patient left: in chair;with call bell/phone within reach;with family/visitor present  OT Visit Diagnosis: Unsteadiness on feet (R26.81);Other abnormalities of gait and mobility (R26.89);Muscle weakness (generalized) (M62.81)                Time: 3903-0092 OT Time Calculation (min): 17 min Charges:  OT General Charges $OT Visit: 1 Visit OT Evaluation $OT Eval Moderate Complexity: [place], OTR/L Acute Rehab Pager: [phone] Office: [place] 05/01/2021, 3:37 PM

## 2021-05-01 NOTE — Progress Notes (Addendum)
Advanced Heart Failure Rounding Note   Subjective:   02/14: DBA stopped 02/16: Underwent DCCV AFL>>NSR  Remains off DBA. Check co-ox.   CVP 6.  Sinus brady 50s this am.   Feels good. No CP or dyspnea.   BP low yesterday (inaccurate). Better this am.    Objective:     Vital Signs:   Temp:  [97.3 F (36.3 C)-98.2 F (36.8 C)] 98.2 F (36.8 C) (02/17 0745) Pulse Rate:  [43-106] 47 (02/17 0500) Resp:  [12-22] 18 (02/17 0723) BP: (86-167)/(40-94) 167/94 (02/17 0723) SpO2:  [80 %-100 %] 97 % (02/17 0500) Weight:  [88.5 kg] 88.5 kg (02/17 0500) Last BM Date : 04/29/21  Weight change: Filed Weights   04/30/21 0500 04/30/21 0740 05/01/21 0500  Weight: 88.5 kg 88.5 kg 88.5 kg    Intake/Output:   Intake/Output Summary (Last 24 hours) at 05/01/2021 0913 Last data filed at 05/01/2021 0732 Gross per 24 hour  Intake 270 ml  Output 500 ml  Net -230 ml    PHYSICAL EXAM: General:  Well appearing. No resp difficulty HEENT: normal Neck: supple. no JVD. Carotids 2+ bilat; no bruits. Cor: PMI nondisplaced. Regular rate & rhythm. No rubs, gallops or murmurs. Lungs: clear Abdomen: soft, nontender, nondistended. No hepatosplenomegaly.  Extremities: no cyanosis, clubbing, rash, edema, + RUE PICC Neuro: alert & orientedx3, cranial nerves grossly intact. moves all 4 extremities w/o difficulty. Affect pleasant   Telemetry: Sinus brady 50s (personally reviewed)    Labs: Basic Metabolic Panel: Recent Labs  Lab 04/25/21 2217 04/25/21 2230 04/26/21 0737 04/27/21 0403 04/28/21 0425 04/29/21 0450 04/30/21 0524 05/01/21 0525  NA 136   139   < > 136 138 138 136 138 137  K 2.8*   2.8*   < > 3.2* 3.7 4.6 4.5 4.1 4.5  CL 93*  --  93* 100 101 102 102 102  CO2 31  --  30 27 28 27 28 27   GLUCOSE 252*  --  171* 139* 156* 130* 94 126*  BUN 14  --  15 18 24* 29* 30* 27*  CREATININE 1.40*  --  1.43* 1.39* 1.35* 1.38* 1.36* 1.17*  CALCIUM 8.8*  --  8.8* 9.1 9.3 8.7* 8.6* 8.6*   MG 1.8  --  1.9 2.6* 2.6*  --  2.7*  --    < > = values in this interval not displayed.    Liver Function Tests: Recent Labs  Lab 04/25/21 2217 05/01/21 0525  AST 36 17  ALT 20 17  ALKPHOS 89 71  BILITOT 0.8 0.4  PROT 6.8 5.7*  ALBUMIN 3.6 2.8*   No results for input(s): LIPASE, AMYLASE in the last 168 hours. No results for input(s): AMMONIA in the last 168 hours.  CBC: Recent Labs  Lab 04/26/21 0737 04/27/21 0403 04/28/21 0425 04/29/21 0450 05/01/21 0525  WBC 8.6 7.8 8.9 6.2 5.2  HGB 12.4 12.4 12.6 12.7 11.9*  HCT 38.3 37.4 39.2 37.9 35.5*  MCV 91.0 90.1 91.6 90.5 90.6  PLT 142* 142* 155 135* 128*    Cardiac Enzymes: No results for input(s): CKTOTAL, CKMB, CKMBINDEX, TROPONINI in the last 168 hours.  BNP: BNP (last 3 results) Recent Labs    04/23/21 2144  BNP 379.1*    ProBNP (last 3 results) No results for input(s): PROBNP in the last 8760 hours.    Other results:  Imaging: No results found.   Medications:     Scheduled Medications:  apixaban  5 mg Oral  BID   aspirin EC  81 mg Oral Daily   calcium citrate  200 mg of elemental calcium Oral Daily   Chlorhexidine Gluconate Cloth  6 each Topical Daily   cholecalciferol  1,000 Units Oral Daily   dapagliflozin propanediol  10 mg Oral Daily   folic acid  1 mg Oral Daily   insulin aspart  0-9 Units Subcutaneous TID WC   levothyroxine  200 mcg Oral QAC breakfast   losartan  12.5 mg Oral QHS   melatonin  3 mg Oral QHS   methotrexate  10 mg Oral Weekly   mexiletine  200 mg Oral Q12H   pantoprazole  40 mg Oral Daily   polyethylene glycol  17 g Oral Daily   predniSONE  30 mg Oral Q breakfast   rosuvastatin  40 mg Oral Daily   sodium chloride flush  10-40 mL Intracatheter Q12H   spironolactone  12.5 mg Oral Daily   sulfamethoxazole-trimethoprim  1 tablet Oral Q M,W,F    Infusions:    PRN Medications: acetaminophen, naphazoline-glycerin, nitroGLYCERIN, ondansetron (ZOFRAN) IV,  senna-docusate, sodium chloride flush   Assessment/Plan:   1.  Acute on chronic systolic HF due to NICM -> cardiogenic shock. Suspect cardiac sarcoid - 5/22 ECHO EF 30-35% - Cath 2/23 minimal CAD. hstroponin 204 -> 379 - Echo 2/23: EF 20-25% - Off DBA. Check Co-ox. - Continue spiro 12.5 mg daily  - Continue Farxiga 10 mg daily  - Increase losartan to 25 mg daily - cMRI EF 11% RVEF 15% LGE pattern concerning for sarcoid. (D/w Dr. Gardiner Rhyme)  - Continue mexiletine - Had pulse-dose steroids. Now on prednisone.  - Now on full sarcoid protocol with MTX/folic acid/PJP prophylaxis   2. PVCs/NSVT/PMVT - EP following.  - did not tolerate amio due to worsening bradycardia - off lido due to n/v that said PVC burden much decreased with lido - continue mexilitene 200 bid - on steroids. Plan as above - ACE level normal (52 U/L)  - Keep K> 4.0 Mg > 2.0    3. Paroxysmal AF/ Atrial Flutter w/ RVR  - S/p DCCV yesterday - Maintaining SR  - Off DBA - Continue apixaban 5 mg bid - Will plan DC-CV this am. If recurs will need to consider IV amio (if develops recurrent bradycardia would need a device) - Keep K > 4.0 and Mg >2.0   4. Hypokalemia - resolved. K 4.5   Mobilize  Okay to transfer to [facility].  Length of Stay: 8  [facility], LINDSAY N MD 05/01/2021, 9:13 AM  Advanced Heart Failure Team Pager [phone] (M-F; 7a - 4p)  Please contact [facility] for night-coverage after hours (4p -7a ) and weekends on [email or web address]   Patient seen and examined with the above-signed Advanced Practice Provider and/or Housestaff. I personally reviewed laboratory data, imaging studies and relevant notes. I independently examined the patient and formulated the important aspects of the plan. I have edited the note to reflect any of my changes or salient points. I have personally discussed the plan with the patient and/or family.  Remains in sinus brady post cardioversion + PACs and occasional PVCs. Co-ox 65%  Was  able to walk hall today, Denies CP or SOB. On Eliquis. No bleeding   General:  Well appearing. No resp difficulty HEENT: normal Neck: supple. JVP 6-7. Carotids 2+ bilat; no bruits. No lymphadenopathy or thryomegaly appreciated. Cor: PMI nondisplaced. Loletha Grayer regular Lungs: clear Abdomen: soft, nontender, nondistended. No hepatosplenomegaly. No bruits or masses. Good bowel sounds.  Extremities: no cyanosis, clubbing, rash, edema Neuro: alert & orientedx3, cranial nerves grossly intact. moves all 4 extremities w/o difficulty. Affect pleasant  Maintaining sinus post DC-CV. If rhythm stable can send home over the weekend on mexilitene. If develops recurrent AF/AFL will need amio and possible pacing. Continue treatment for probable cardiac sarcoid.   Cardiac sarcoid burden low on MRI suspect LV dysfunction likely related to high PVC burden and hope EF will improve with PVC suppression and GDMT. Will eventually need ICD.  Glori Bickers, MD  10:33 PM

## 2021-05-01 NOTE — Progress Notes (Signed)
Progress Note  Patient Name: Sheri Morgan Date of Encounter: 05/01/2021  [facility] Cardiologist: Elouise Munroe, MD   Subjective   I feel better no CP, no SOB  Inpatient Medications    Scheduled Meds:  apixaban  5 mg Oral BID   aspirin EC  81 mg Oral Daily   calcium citrate  200 mg of elemental calcium Oral Daily   Chlorhexidine Gluconate Cloth  6 each Topical Daily   cholecalciferol  1,000 Units Oral Daily   dapagliflozin propanediol  10 mg Oral Daily   folic acid  1 mg Oral Daily   insulin aspart  0-9 Units Subcutaneous TID WC   levothyroxine  200 mcg Oral QAC breakfast   losartan  12.5 mg Oral QHS   melatonin  3 mg Oral QHS   methotrexate  10 mg Oral Weekly   mexiletine  200 mg Oral Q12H   pantoprazole  40 mg Oral Daily   polyethylene glycol  17 g Oral Daily   predniSONE  30 mg Oral Q breakfast   rosuvastatin  40 mg Oral Daily   sodium chloride flush  10-40 mL Intracatheter Q12H   spironolactone  12.5 mg Oral Daily   sulfamethoxazole-trimethoprim  1 tablet Oral Q M,W,F   Continuous Infusions:   PRN Meds: acetaminophen, naphazoline-glycerin, nitroGLYCERIN, ondansetron (ZOFRAN) IV, senna-docusate, sodium chloride flush   Vital Signs    Vitals:   05/01/21 0200 05/01/21 0300 05/01/21 0400 05/01/21 0500  BP: 130/60 125/60 (!) 108/40 (!) 119/58  Pulse: (!) 46 (!) 43 (!) 53 (!) 47  Resp: 16 17 12 16   Temp:   98 F (36.7 C)   TempSrc:   Oral   SpO2: 97% 97% 97% 97%  Weight:    88.5 kg  Height:        Intake/Output Summary (Last 24 hours) at 05/01/2021 0724 Last data filed at 05/01/2021 0700 Gross per 24 hour  Intake 20 ml  Output 500 ml  Net -480 ml   Last 3 Weights 05/01/2021 04/30/2021 04/30/2021  Weight (lbs) 195 lb 1.7 oz 195 lb 1.7 oz 195 lb 1.7 oz  Weight (kg) 88.5 kg 88.5 kg 88.5 kg      Telemetry    SB 50's, noctural rates 40 or so, 1st degree AVBlock- Personally Reviewed  ECG    No new EKgs - Personally Reviewed  Physical Exam    GEN: No acute distress.  Sitting at edge of the bed, getting ready for breakfast Neck: No JVD Cardiac: RRR, bradycardic, no murmurs, rubs, or gallops.  Respiratory: CTA b/l. GI: Soft, nontender, non-distended  MS: No edema; No deformity. Neuro:  Nonfocal  Psych: Normal affect   Labs    High Sensitivity Troponin:   Recent Labs  Lab 04/23/21 1847 04/23/21 2144  TROPONINIHS 204* 379*     Chemistry Recent Labs  Lab 04/25/21 2217 04/25/21 2230 04/27/21 0403 04/28/21 0425 04/29/21 0450 04/30/21 0524 05/01/21 0525  NA 136   139   < > 138 138 136 138 137  K 2.8*   2.8*   < > 3.7 4.6 4.5 4.1 4.5  CL 93*   < > 100 101 102 102 102  CO2 31   < > 27 28 27 28 27   GLUCOSE 252*   < > 139* 156* 130* 94 126*  BUN 14   < > 18 24* 29* 30* 27*  CREATININE 1.40*   < > 1.39* 1.35* 1.38* 1.36* 1.17*  CALCIUM 8.8*   < >  9.1 9.3 8.7* 8.6* 8.6*  MG 1.8   < > 2.6* 2.6*  --  2.7*  --   PROT 6.8  --   --   --   --   --  5.7*  ALBUMIN 3.6  --   --   --   --   --  2.8*  AST 36  --   --   --   --   --  17  ALT 20  --   --   --   --   --  17  ALKPHOS 89  --   --   --   --   --  71  BILITOT 0.8  --   --   --   --   --  0.4  GFRNONAA 39*   < > 39* 40* 39* 40* 48*  ANIONGAP 12   < > 11 9 7 8 8    < > = values in this interval not displayed.    Lipids  No results for input(s): CHOL, TRIG, HDL, LABVLDL, LDLCALC, CHOLHDL in the last 168 hours.   Hematology Recent Labs  Lab 04/28/21 0425 04/29/21 0450 05/01/21 0525  WBC 8.9 6.2 5.2  RBC 4.28 4.19 3.92  HGB 12.6 12.7 11.9*  HCT 39.2 37.9 35.5*  MCV 91.6 90.5 90.6  MCH 29.4 30.3 30.4  MCHC 32.1 33.5 33.5  RDW 16.3* 16.6* 16.7*  PLT 155 135* 128*   Thyroid  No results for input(s): TSH, FREET4 in the last 168 hours.   BNP No results for input(s): BNP, PROBNP in the last 168 hours.   DDimer No results for input(s): DDIMER in the last 168 hours.   Radiology    DG Chest Port 1 View  Result Date: 04/25/2021 CLINICAL DATA:  Non STEMI.  EXAM: PORTABLE CHEST 1 VIEW COMPARISON:  Chest radiograph dated 04/25/2021. FINDINGS: Right-sided PICC in similar position. No focal consolidation, pleural effusion, pneumothorax. Stable cardiomegaly. No acute osseous pathology. IMPRESSION: 1. No acute cardiopulmonary process. 2. Stable cardiomegaly. Electronically Signed   By: Anner Crete M.D.   On: 04/25/2021 22:50   DG CHEST PORT 1 VIEW  Result Date: 04/25/2021 CLINICAL DATA:  PICC placement. EXAM: PORTABLE CHEST 1 VIEW COMPARISON:  Chest radiographs 12/18/2020 and CTA 04/23/2021 FINDINGS: A right PICC has been placed, the tip of which is difficult to visualize due to superimposition of the spine and telemetry leads, however it likely is located in the lower SVC. The cardiac silhouette remains mildly enlarged. No airspace consolidation, edema, pleural effusion, pneumothorax is identified. No acute osseous abnormality is seen. IMPRESSION: Right PICC placement as above. Electronically Signed   By: Logan Bores M.D.   On: 04/25/2021 12:18    Cardiac Studies    04/28/21: c.MRI IMPRESSION: Severely reduced LV function (11%)   There is apical LGE.   Though the 2018 [facility] Criteria is met for myocarditis, areas of T2 elevated, LGE, and ECV signal are atypical for myocarditis.   LGE is focal, not in the septum, and without RV involvement. This is less consistent with the pattern seen in cardiac sarcoidosis.   Study done free breathing at elevated heart rates.   04/24/21: TTE IMPRESSIONS   1. No left ventricular thrombus is seen. Left ventricular ejection  fraction, by estimation, is 20 to 25%. The left ventricle has severely  decreased function. The left ventricle demonstrates global hypokinesis.  The left ventricular internal cavity size  was mildly dilated. Left ventricular diastolic  function could not be  evaluated.   2. Right ventricular systolic function is moderately reduced. The right  ventricular size is mildly  enlarged. There is moderately elevated  pulmonary artery systolic pressure. The estimated right ventricular  systolic pressure is 12.1 mmHg.   3. Left atrial size was severely dilated.   4. The mitral valve is normal in structure. Mild to moderate mitral valve  regurgitation.   5. The aortic valve is tricuspid. There is mild thickening of the aortic  valve. Aortic valve regurgitation is not visualized. Aortic valve  sclerosis is present, with no evidence of aortic valve stenosis.   6. The inferior vena cava is normal in size with greater than 50%  respiratory variability, suggesting right atrial pressure of 3 mmHg.   Comparison(s): Prior images reviewed side by side. The left ventricular  function is worsened.    04/23/21: LHC   Prox LAD lesion is 30% stenosed.   There is moderate left ventricular systolic dysfunction.   LV end diastolic pressure is severely elevated.   The left ventricular ejection fraction is 35-45% by visual estimate.   Mild non-obstructive CAD Elevated LVEDP LV wall motion abnormality noted with hand injection. (no power injection of the LV given elevated filling pressures). This may represent a Takotsubo's cardiomyopathy.  Ventricular tachycardia prior to cath.    Patient Profile     76 y.o. adult w/PMHx of Fibromyalgia, HTN, Afib  Admitted 2/9 with CP and worsening SOB. Labs notable for troponin 204 -> 379 and BNP 379.1. EKG showed sinus tach with sub-1 mm STE in I and aVL and sinus tachycardia. CT dissection protocol was negative for dissection.  Cath 2/10 with minimal CAD EF 20-25%. ? Possible takotsubo.  Echo 20-25% moderate MR. On way to cath developed VT. Started on amiodarone developed more bradycardia -> PMVT   Cardiogenic shock  Lidocaine stopped 2/2 N/V and failure to suppress arrhythmias NSPMVT (long-short sequenced)  Dobutamine gtt 1.25 Mexiletine 267m BID  Assessment & Plan    Recurrent NSPMVT Occasional PVCs On mexiletine Keep K+ and  Mag supplemented PVC burden much improved  NICM Unclear etiology MRI noted, still w/suspicion of Sarcoid C/w AHF team  Paroxysmal Afib (hx mentions an atypical flutter as well) AFlutter with 1:1 and 2:1 conduction intermittently last night and again this AM, all self terminated CHA2DS2Vasc 4, on Eliquis appropriately dosed DCCV this 2/16 > SB 50's (nocturnal 40's)   6. Hypotension yesterday ? Cuff position Palpable radial pulses, equal by my assessment, excelent pleth signal as well.      For questions or updates, please contact CHidden MeadowsPlease consult [email or web address] for contact info under        Signed, RBaldwin Jamaica PA-C  05/01/2021, 7:24 AM

## 2021-05-01 NOTE — Evaluation (Signed)
Physical Therapy Evaluation Patient Details Name: Sheri Morgan MRN: [id number] DOB: 05-14-1945 Today's Date: 05/01/2021  History of Present Illness  Pt is a 76 y.o. female who presented 04/23/21 with x2 week hx of SOB and chest pain with exertion and then sudden chest pain at rest with nausea and emesis on 2/9. In route to cath lab, pt had stable VT. S/p LHC 2/9 which showed non-obstructive CAD with an elevated LVEDP and reduced EF. S/p cardioversion 2/16. Possibly cardiac sarcoid. PMH: atypical atrial flutter, fibromyalgia, HTN, HFrEF, Afib   Clinical Impression  Pt presents with condition above and deficits mentioned below, see PT Problem List. PTA, she was independent without DME, living with her son in a 1-level house with 1 STE. Currently, pt displays some mild deficits in endurance/activity tolerance and balance. She scored a 19 on the DGI today, indicating a risk for falls. However, pt was able to recover during any bout of minor staggering without assistance or UE support. Suspect that pt will quickly progress back to her baseline with continued mobility. Encouraged pt and sons for pt to ambulate >/= 2x/day while here. Educated pt on eating fresh fruits and vegetables and reducing sodium and processed foods intake. Will continue to follow acutely for likely just 1-2 more sessions to maximize pt return to baseline prior to d/c.      Recommendations for follow up therapy are one component of a multi-disciplinary discharge planning process, led by the attending physician.  Recommendations may be updated based on patient status, additional functional criteria and insurance authorization.  Follow Up Recommendations No PT follow up    Assistance Recommended at Discharge PRN  Patient can return home with the following  Assistance with cooking/housework;Help with stairs or ramp for entrance    Equipment Recommendations None recommended by PT  Recommendations for Other Services        Functional Status Assessment Patient has had a recent decline in their functional status and demonstrates the ability to make significant improvements in function in a reasonable and predictable amount of time.     Precautions / Restrictions Precautions Precautions: Fall Precaution Comments: low risk Restrictions Weight Bearing Restrictions: No      Mobility  Bed Mobility Overal bed mobility: Modified Independent             General bed mobility comments: Pt able to transition supine > sit EOB with HOB elevated without assistance.    Transfers Overall transfer level: Needs assistance Equipment used: None Transfers: Sit to/from Stand Sit to Stand: Supervision           General transfer comment: Supervision for safety, no LOB.    Ambulation/Gait Ambulation/Gait assistance: Min guard Gait Distance (Feet): 410 Feet Assistive device: None Gait Pattern/deviations: Step-through pattern, Decreased stride length, Staggering left, Staggering right Gait velocity: reduced Gait velocity interpretation: <1.8 ft/sec, indicate of risk for recurrent falls   General Gait Details: Pt with fairly slow gait with intermittent min stagger laterally with dynamic gait challenges, but able to recover without LOB or assistance. Min guard for safety.  Stairs            Wheelchair Mobility    Modified Rankin (Stroke Patients Only)       Balance Overall balance assessment: Mild deficits observed, not formally tested                               Standardized Balance Assessment Standardized Balance Assessment :  Dynamic Gait Index   Dynamic Gait Index Level Surface: Mild Impairment Change in Gait Speed: Normal Gait with Horizontal Head Turns: Mild Impairment Gait with Vertical Head Turns: Mild Impairment Gait and Pivot Turn: Normal Step Over Obstacle: Mild Impairment Step Around Obstacles: Normal Steps: Mild Impairment (assumed, not tested) Total Score: 19        Pertinent Vitals/Pain Pain Assessment Pain Assessment: Faces Faces Pain Scale: Hurts a little bit Pain Location: bil anterior thighs Pain Descriptors / Indicators: Tightness Pain Intervention(s): Limited activity within patient's tolerance, Monitored during session, Repositioned    Home Living Family/patient expects to be discharged to:: Private residence Living Arrangements: Children (adult son) Available Help at Discharge: Family Type of Home: House Home Access: Stairs to enter Entrance Stairs-Rails: None Technical brewer of Steps: 1   Home Layout: One level Home Equipment: None      Prior Function Prior Level of Function : Independent/Modified Independent;Driving             Mobility Comments: x1 fall off rolling chair 1x due to "probably was too close to the edge of it and slipped off", but otherwise no falls. No AD. ADLs Comments: Does own shopping, cooking, cleaning etc     Hand Dominance        Extremity/Trunk Assessment   Upper Extremity Assessment Upper Extremity Assessment: Overall WFL for tasks assessed;Defer to OT evaluation    Lower Extremity Assessment Lower Extremity Assessment: Overall WFL for tasks assessed (denies numbness/tingling bil; MMT scores 4+ to 5 grossly bil)    Cervical / Trunk Assessment Cervical / Trunk Assessment: Normal  Communication   Communication: No difficulties  Cognition Arousal/Alertness: Awake/alert Behavior During Therapy: WFL for tasks assessed/performed Overall Cognitive Status: Within Functional Limits for tasks assessed                                          General Comments General comments (skin integrity, edema, etc.): VSS on RA    Exercises     Assessment/Plan    PT Assessment Patient needs continued PT services  PT Problem List Decreased activity tolerance;Decreased balance;Decreased mobility;Cardiopulmonary status limiting activity       PT Treatment Interventions  DME instruction;Gait training;Stair training;Functional mobility training;Therapeutic activities;Therapeutic exercise;Balance training;Neuromuscular re-education;Patient/family education    PT Goals (Current goals can be found in the Care Plan section)  Acute Rehab PT Goals Patient Stated Goal: to get back to her normal PT Goal Formulation: With patient/family Time For Goal Achievement: 05/15/21 Potential to Achieve Goals: Good    Frequency Min 2X/week     Co-evaluation               AM-PAC PT "6 Clicks" Mobility  Outcome Measure Help needed turning from your back to your side while in a flat bed without using bedrails?: None Help needed moving from lying on your back to sitting on the side of a flat bed without using bedrails?: None Help needed moving to and from a bed to a chair (including a wheelchair)?: A Little Help needed standing up from a chair using your arms (e.g., wheelchair or bedside chair)?: A Little Help needed to walk in hospital room?: A Little Help needed climbing 3-5 steps with a railing? : A Little 6 Click Score: 20    End of Session Equipment Utilized During Treatment: Gait belt Activity Tolerance: Patient tolerated treatment well Patient left: in chair;with  call bell/phone within reach;with family/visitor present Nurse Communication: Mobility status PT Visit Diagnosis: Unsteadiness on feet (R26.81);Other abnormalities of gait and mobility (R26.89)    Time: [id number] PT Time Calculation (min) (ACUTE ONLY): 18 min   Charges:   PT Evaluation $PT Eval Moderate Complexity: 1 Mod          Moishe Spice, PT, DPT Acute Rehabilitation Services  Pager: [phone] Office: [phone]   Orvan Falconer 05/01/2021, 2:48 PM

## 2021-05-02 DIAGNOSIS — I495 Sick sinus syndrome: Secondary | ICD-10-CM | POA: Diagnosis not present

## 2021-05-02 DIAGNOSIS — R570 Cardiogenic shock: Secondary | ICD-10-CM | POA: Diagnosis not present

## 2021-05-02 DIAGNOSIS — I480 Paroxysmal atrial fibrillation: Secondary | ICD-10-CM | POA: Diagnosis not present

## 2021-05-02 DIAGNOSIS — I5023 Acute on chronic systolic (congestive) heart failure: Secondary | ICD-10-CM | POA: Diagnosis not present

## 2021-05-02 LAB — CBC
HCT: 36.2 % (ref 36.0–46.0)
Hemoglobin: 12.1 g/dL (ref 12.0–15.0)
MCH: 30.1 pg (ref 26.0–34.0)
MCHC: 33.4 g/dL (ref 30.0–36.0)
MCV: 90 fL (ref 80.0–100.0)
Platelets: 121 10*3/uL — ABNORMAL LOW (ref 150–400)
RBC: 4.02 MIL/uL (ref 3.87–5.11)
RDW: 16 % — ABNORMAL HIGH (ref 11.5–15.5)
WBC: 6.8 10*3/uL (ref 4.0–10.5)
nRBC: 0 % (ref 0.0–0.2)

## 2021-05-02 LAB — COOXEMETRY PANEL
Carboxyhemoglobin: 1.4 % (ref 0.5–1.5)
Methemoglobin: <0.7 % (ref 0.0–1.5)
O2 Saturation: 71.1 %
Total hemoglobin: 12.3 g/dL (ref 12.0–16.0)

## 2021-05-02 LAB — BASIC METABOLIC PANEL
Anion gap: 7 (ref 5–15)
BUN: 25 mg/dL — ABNORMAL HIGH (ref 8–23)
CO2: 28 mmol/L (ref 22–32)
Calcium: 8.5 mg/dL — ABNORMAL LOW (ref 8.9–10.3)
Chloride: 103 mmol/L (ref 98–111)
Creatinine, Ser: 1.34 mg/dL — ABNORMAL HIGH (ref 0.44–1.00)
GFR, Estimated: 41 mL/min — ABNORMAL LOW (ref >60)
Glucose, Bld: 99 mg/dL (ref 70–99)
Potassium: 4.1 mmol/L (ref 3.5–5.1)
Sodium: 138 mmol/L (ref 135–145)

## 2021-05-02 LAB — GLUCOSE, CAPILLARY
Glucose-Capillary: 89 mg/dL (ref 70–99)
Glucose-Capillary: 105 mg/dL — ABNORMAL HIGH (ref 70–99)
Glucose-Capillary: 128 mg/dL — ABNORMAL HIGH (ref 70–99)
Glucose-Capillary: 126 mg/dL — ABNORMAL HIGH (ref 70–99)

## 2021-05-02 MED ORDER — AMIODARONE HCL 200 MG PO TABS
200.0000 mg | ORAL_TABLET | Freq: Two times a day (BID) | ORAL | Status: DC
Start: 2021-05-02 — End: 2021-05-04
  Administered 2021-05-02 – 2021-05-04 (×5): 200 mg via ORAL
  Filled 2021-05-02 (×5): qty 1

## 2021-05-02 MED ORDER — SACUBITRIL-VALSARTAN 24-26 MG PO TABS
1.0000 | ORAL_TABLET | Freq: Two times a day (BID) | ORAL | Status: DC
  Administered 2021-05-02 – 2021-05-05 (×7): 1 via ORAL
  Filled 2021-05-02 (×8): qty 1

## 2021-05-02 NOTE — Progress Notes (Signed)
Progress Note  Patient Name: Sheri Morgan Date of Encounter: 05/02/2021  Primary Cardiologist: Elouise Munroe, MD   Subjective   Denies chest pain or sob. Asked by Dr. Aundra Dubin to revisit as she has reverted back to atrial fib/flutter.   Inpatient Medications    Scheduled Meds:  amiodarone  200 mg Oral BID   apixaban  5 mg Oral BID   calcium citrate  200 mg of elemental calcium Oral Daily   Chlorhexidine Gluconate Cloth  6 each Topical Daily   cholecalciferol  1,000 Units Oral Daily   dapagliflozin propanediol  10 mg Oral Daily   folic acid  1 mg Oral Daily   insulin aspart  0-9 Units Subcutaneous TID WC   levothyroxine  200 mcg Oral QAC breakfast   melatonin  3 mg Oral QHS   methotrexate  10 mg Oral Weekly   mexiletine  200 mg Oral Q12H   pantoprazole  40 mg Oral Daily   polyethylene glycol  17 g Oral Daily   predniSONE  30 mg Oral Q breakfast   rosuvastatin  40 mg Oral Daily   sacubitril-valsartan  1 tablet Oral BID   sodium chloride flush  10-40 mL Intracatheter Q12H   spironolactone  12.5 mg Oral Daily   sulfamethoxazole-trimethoprim  1 tablet Oral Q M,W,F   Continuous Infusions:  PRN Meds: acetaminophen, naphazoline-glycerin, nitroGLYCERIN, ondansetron (ZOFRAN) IV, senna-docusate, sodium chloride flush   Vital Signs    Vitals:   05/02/21 0915 05/02/21 1000 05/02/21 1100 05/02/21 1200  BP: (!) 156/70     Pulse:      Resp: 20 16 15 17   Temp:   (!) 97.5 F (36.4 C)   TempSrc:   Oral   SpO2:      Weight:      Height:        Intake/Output Summary (Last 24 hours) at 05/02/2021 1403 Last data filed at 05/02/2021 1228 Gross per 24 hour  Intake 470 ml  Output --  Net 470 ml   Filed Weights   04/30/21 0500 04/30/21 0740 05/01/21 0500  Weight: 88.5 kg 88.5 kg 88.5 kg    Telemetry    Atrial fib/flutter with a CVR/RVR - Personally Reviewed  ECG    none - Personally Reviewed  Physical Exam   GEN: No acute distress.   Neck: No JVD Cardiac:  IRRR, no murmurs, rubs, or gallops.  Respiratory: Clear to auscultation bilaterally. GI: Soft, nontender, non-distended  MS: No edema; No deformity. Neuro:  Nonfocal  Psych: Normal affect   Labs    Chemistry Recent Labs  Lab 04/25/21 2217 04/25/21 2230 04/30/21 0524 05/01/21 0525 05/02/21 0451  NA 136   139   < > 138 137 138  K 2.8*   2.8*   < > 4.1 4.5 4.1  CL 93*   < > 102 102 103  CO2 31   < > 28 27 28   GLUCOSE 252*   < > 94 126* 99  BUN 14   < > 30* 27* 25*  CREATININE 1.40*   < > 1.36* 1.17* 1.34*  CALCIUM 8.8*   < > 8.6* 8.6* 8.5*  PROT 6.8  --   --  5.7*  --   ALBUMIN 3.6  --   --  2.8*  --   AST 36  --   --  17  --   ALT 20  --   --  17  --   ALKPHOS 89  --   --  71  --   BILITOT 0.8  --   --  0.4  --   GFRNONAA 39*   < > 40* 48* 41*  ANIONGAP 12   < > 8 8 7    < > = values in this interval not displayed.     Hematology Recent Labs  Lab 04/29/21 0450 05/01/21 0525 05/02/21 0451  WBC 6.2 5.2 6.8  RBC 4.19 3.92 4.02  HGB 12.7 11.9* 12.1  HCT 37.9 35.5* 36.2  MCV 90.5 90.6 90.0  MCH 30.3 30.4 30.1  MCHC 33.5 33.5 33.4  RDW 16.6* 16.7* 16.0*  PLT 135* 128* 121*    Cardiac EnzymesNo results for input(s): TROPONINI in the last 168 hours. No results for input(s): TROPIPOC in the last 168 hours.   BNPNo results for input(s): BNP, PROBNP in the last 168 hours.   DDimer No results for input(s): DDIMER in the last 168 hours.   Radiology    No results found.  Cardiac Studies   MRI reviewed and with multiple wall motion abnormalities.  Patient Profile     76 y.o. adult woman with multiple wall motion abnormalities, PMVT, MMVT, sinus node dysfunction and severe LV dysfunction and worsening CHF.  Assessment & Plan    PAF - she is back in atrial fib/flutter. She will need to be placed on amiodarone.  Sinus node dysfunction - she is severely bradycardic and will only worsen on amiodarone which has already been stopped. I have recommended she undergo  back up pacing Chronic systolic heart failure - her symptoms date back months but a relatively new finding. Her EF by MRI is 11% by volume and RV EF is also down (35%). We will plan GDMT and DDD ICD.   Sheri Morgan  For questions or updates, please contact [facility] Please consult [email or web address] for contact info under Cardiology/STEMI.      Signed, Cristopher Peru, MD  05/02/2021, 2:03 PM

## 2021-05-02 NOTE — Progress Notes (Signed)
Patient ID: Sheri Morgan, adult   DOB: 12/01/1945, 76 y.o.   MRN: [id number]    Advanced Heart Failure Rounding Note   Subjective:    02/14: DBA stopped 02/16: Underwent DCCV AFL>>NSR 02/18: Recurrent AFL  Off dobutamine, co-ox stable at 71%. CVP unhooked.   Back in AFL overnight, rate 60s, but does not feel different.   MAP stable.   Objective:     Vital Signs:   Temp:  [98.2 F (36.8 C)-98.4 F (36.9 C)] 98.3 F (36.8 C) (02/17 1935) Pulse Rate:  [54-62] 61 (02/17 1627) Resp:  [12-22] 12 (02/18 0400) BP: (114-167)/(49-112) 118/65 (02/18 0400) SpO2:  [95 %-97 %] 96 % (02/18 0400) Last BM Date : 04/29/21  Weight change: Filed Weights   04/30/21 0500 04/30/21 0740 05/01/21 0500  Weight: 88.5 kg 88.5 kg 88.5 kg    Intake/Output:   Intake/Output Summary (Last 24 hours) at 05/02/2021 0716 Last data filed at 05/01/2021 2000 Gross per 24 hour  Intake 490 ml  Output --  Net 490 ml    PHYSICAL EXAM: General: NAD Neck: No JVD, no thyromegaly or thyroid nodule.  Lungs: Clear to auscultation bilaterally with normal respiratory effort. CV: Nondisplaced PMI.  Heart irregular S1/S2, no S3/S4, no murmur.  No peripheral edema.   Abdomen: Soft, nontender, no hepatosplenomegaly, no distention.  Skin: Intact without lesions or rashes.  Neurologic: Alert and oriented x 3.  Psych: Normal affect. Extremities: No clubbing or cyanosis.  HEENT: Normal.    Telemetry: AFL 60s (personally reviewed)    Labs: Basic Metabolic Panel: Recent Labs  Lab 04/25/21 2217 04/25/21 2230 04/26/21 0737 04/27/21 0403 04/28/21 0425 04/29/21 0450 04/30/21 0524 05/01/21 0525 05/02/21 0451  NA 136   139   < > 136 138 138 136 138 137 138  K 2.8*   2.8*   < > 3.2* 3.7 4.6 4.5 4.1 4.5 4.1  CL 93*  --  93* 100 101 102 102 102 103  CO2 31  --  30 27 28 27 28 27 28   GLUCOSE 252*  --  171* 139* 156* 130* 94 126* 99  BUN 14  --  15 18 24* 29* 30* 27* 25*  CREATININE 1.40*  --  1.43* 1.39*  1.35* 1.38* 1.36* 1.17* 1.34*  CALCIUM 8.8*  --  8.8* 9.1 9.3 8.7* 8.6* 8.6* 8.5*  MG 1.8  --  1.9 2.6* 2.6*  --  2.7*  --   --    < > = values in this interval not displayed.    Liver Function Tests: Recent Labs  Lab 04/25/21 2217 05/01/21 0525  AST 36 17  ALT 20 17  ALKPHOS 89 71  BILITOT 0.8 0.4  PROT 6.8 5.7*  ALBUMIN 3.6 2.8*   No results for input(s): LIPASE, AMYLASE in the last 168 hours. No results for input(s): AMMONIA in the last 168 hours.  CBC: Recent Labs  Lab 04/27/21 0403 04/28/21 0425 04/29/21 0450 05/01/21 0525 05/02/21 0451  WBC 7.8 8.9 6.2 5.2 6.8  HGB 12.4 12.6 12.7 11.9* 12.1  HCT 37.4 39.2 37.9 35.5* 36.2  MCV 90.1 91.6 90.5 90.6 90.0  PLT 142* 155 135* 128* 121*    Cardiac Enzymes: No results for input(s): CKTOTAL, CKMB, CKMBINDEX, TROPONINI in the last 168 hours.  BNP: BNP (last 3 results) Recent Labs    04/23/21 2144  BNP 379.1*    ProBNP (last 3 results) No results for input(s): PROBNP in the last 8760 hours.  Other results:  Imaging: No results found.   Medications:     Scheduled Medications:  apixaban  5 mg Oral BID   calcium citrate  200 mg of elemental calcium Oral Daily   Chlorhexidine Gluconate Cloth  6 each Topical Daily   cholecalciferol  1,000 Units Oral Daily   dapagliflozin propanediol  10 mg Oral Daily   folic acid  1 mg Oral Daily   insulin aspart  0-9 Units Subcutaneous TID WC   levothyroxine  200 mcg Oral QAC breakfast   melatonin  3 mg Oral QHS   methotrexate  10 mg Oral Weekly   mexiletine  200 mg Oral Q12H   pantoprazole  40 mg Oral Daily   polyethylene glycol  17 g Oral Daily   predniSONE  30 mg Oral Q breakfast   rosuvastatin  40 mg Oral Daily   sacubitril-valsartan  1 tablet Oral BID   sodium chloride flush  10-40 mL Intracatheter Q12H   spironolactone  12.5 mg Oral Daily   sulfamethoxazole-trimethoprim  1 tablet Oral Q M,W,F    Infusions:    PRN Medications: acetaminophen,  naphazoline-glycerin, nitroGLYCERIN, ondansetron (ZOFRAN) IV, senna-docusate, sodium chloride flush   Assessment/Plan:   1.  Acute on chronic systolic HF due to NICM -> cardiogenic shock. Suspect cardiac sarcoid based on cardiac MRI.  - 5/22 ECHO EF 30-35% - Cath 2/23 minimal CAD. hstroponin 204 -> 379 - Echo 2/23: EF 20-25% - Off DBA, co-ox 71%.  - Continue spiro 12.5 mg daily  - Continue Farxiga 10 mg daily  - BP stable on losartan, transition to Entresto 24/26 bid.  - cMRI EF 11% RVEF 15% LGE pattern concerning for sarcoid. (D/w Dr. Bjorn Pippin)  - Continue mexiletine for PVCs.  - Had pulse-dose steroids. Now on prednisone.  - Now on full sarcoid protocol with MTX/folic acid/PJP prophylaxis   2. PVCs/NSVT/PMVT - EP following.  - did not tolerate amiodarone due to worsening bradycardia - off lido due to n/v that said PVC burden much decreased with lido - continue mexilitene 200 bid - on steroids. Plan as above - ACE level normal (52 U/L)  - Keep K> 4.0 Mg > 2.0    3. Paroxysmal Atrial Flutter - S/p DCCV 2/16 - Now back in AFL on 2/18.  - Off DBA - Continue apixaban 5 mg bid - Had issues with bradycardia with IV amiodarone.  Will try to gently restart po amiodarone 200 mg bid and follow heart rate.  If she tolerates amiodarone without significant bradycardia, DCCV on Monday.  Will ask EP to see again, would ablation this admission be an option?   4. Hypokalemia - resolved. K 4.5  Mobilize  Watch on 2H for now with amiodarone administration given prior bradycardia.   Marca Ancona 05/02/2021 7:22 AM

## 2021-05-03 DIAGNOSIS — I495 Sick sinus syndrome: Secondary | ICD-10-CM | POA: Diagnosis not present

## 2021-05-03 DIAGNOSIS — I480 Paroxysmal atrial fibrillation: Secondary | ICD-10-CM | POA: Diagnosis not present

## 2021-05-03 LAB — BASIC METABOLIC PANEL
Anion gap: 7 (ref 5–15)
BUN: 21 mg/dL (ref 8–23)
CO2: 27 mmol/L (ref 22–32)
Calcium: 8.5 mg/dL — ABNORMAL LOW (ref 8.9–10.3)
Chloride: 103 mmol/L (ref 98–111)
Creatinine, Ser: 1.3 mg/dL — ABNORMAL HIGH (ref 0.44–1.00)
GFR, Estimated: 42 mL/min — ABNORMAL LOW (ref >60)
Glucose, Bld: 106 mg/dL — ABNORMAL HIGH (ref 70–99)
Potassium: 4.3 mmol/L (ref 3.5–5.1)
Sodium: 137 mmol/L (ref 135–145)

## 2021-05-03 LAB — GLUCOSE, CAPILLARY
Glucose-Capillary: 91 mg/dL (ref 70–99)
Glucose-Capillary: 114 mg/dL — ABNORMAL HIGH (ref 70–99)
Glucose-Capillary: 133 mg/dL — ABNORMAL HIGH (ref 70–99)
Glucose-Capillary: 165 mg/dL — ABNORMAL HIGH (ref 70–99)

## 2021-05-03 LAB — CBC
HCT: 40.3 % (ref 36.0–46.0)
Hemoglobin: 13.3 g/dL (ref 12.0–15.0)
MCH: 30.1 pg (ref 26.0–34.0)
MCHC: 33 g/dL (ref 30.0–36.0)
MCV: 91.2 fL (ref 80.0–100.0)
Platelets: 144 10*3/uL — ABNORMAL LOW (ref 150–400)
RBC: 4.42 MIL/uL (ref 3.87–5.11)
RDW: 16.3 % — ABNORMAL HIGH (ref 11.5–15.5)
WBC: 7.3 10*3/uL (ref 4.0–10.5)
nRBC: 0 % (ref 0.0–0.2)

## 2021-05-03 MED ORDER — SPIRONOLACTONE 25 MG PO TABS
25.0000 mg | ORAL_TABLET | Freq: Every day | ORAL | Status: DC
Start: 2021-05-03 — End: 2021-05-05
  Administered 2021-05-03 – 2021-05-05 (×3): 25 mg via ORAL
  Filled 2021-05-03 (×3): qty 1

## 2021-05-03 NOTE — Progress Notes (Signed)
Progress Note  Patient Name: Sheri Morgan Date of Encounter: 05/03/2021  Primary Cardiologist: Elouise Munroe, MD   Subjective   Denies chest pain or sob.   Inpatient Medications    Scheduled Meds:  amiodarone  200 mg Oral BID   apixaban  5 mg Oral BID   calcium citrate  200 mg of elemental calcium Oral Daily   Chlorhexidine Gluconate Cloth  6 each Topical Daily   cholecalciferol  1,000 Units Oral Daily   dapagliflozin propanediol  10 mg Oral Daily   folic acid  1 mg Oral Daily   insulin aspart  0-9 Units Subcutaneous TID WC   levothyroxine  200 mcg Oral QAC breakfast   melatonin  3 mg Oral QHS   methotrexate  10 mg Oral Weekly   mexiletine  200 mg Oral Q12H   pantoprazole  40 mg Oral Daily   polyethylene glycol  17 g Oral Daily   predniSONE  30 mg Oral Q breakfast   rosuvastatin  40 mg Oral Daily   sacubitril-valsartan  1 tablet Oral BID   sodium chloride flush  10-40 mL Intracatheter Q12H   spironolactone  25 mg Oral Daily   sulfamethoxazole-trimethoprim  1 tablet Oral Q M,W,F   Continuous Infusions:  PRN Meds: acetaminophen, naphazoline-glycerin, nitroGLYCERIN, ondansetron (ZOFRAN) IV, senna-docusate, sodium chloride flush   Vital Signs    Vitals:   05/03/21 0700 05/03/21 0800 05/03/21 0900 05/03/21 1000  BP: (!) 154/101 (!) 170/144    Pulse:      Resp: (!) 21 (!) 21 19 16   Temp:      TempSrc:      SpO2:      Weight:      Height:        Intake/Output Summary (Last 24 hours) at 05/03/2021 1115 Last data filed at 05/03/2021 0800 Gross per 24 hour  Intake 680 ml  Output --  Net 680 ml   Filed Weights   04/30/21 0740 05/01/21 0500 05/03/21 0500  Weight: 88.5 kg 88.5 kg 88 kg    Telemetry    Atrial fib/flutter with a CVR/SVR/RVR - Personally Reviewed  ECG    none - Personally Reviewed  Physical Exam   GEN: No acute distress.   Neck: No JVD Cardiac: IRRR, no murmurs, rubs, or gallops.  Respiratory: Clear to auscultation  bilaterally. GI: Soft, nontender, non-distended  MS: No edema; No deformity. Neuro:  Nonfocal  Psych: Normal affect   Labs    Chemistry Recent Labs  Lab 05/01/21 0525 05/02/21 0451 05/03/21 0324  NA 137 138 137  K 4.5 4.1 4.3  CL 102 103 103  CO2 27 28 27   GLUCOSE 126* 99 106*  BUN 27* 25* 21  CREATININE 1.17* 1.34* 1.30*  CALCIUM 8.6* 8.5* 8.5*  PROT 5.7*  --   --   ALBUMIN 2.8*  --   --   AST 17  --   --   ALT 17  --   --   ALKPHOS 71  --   --   BILITOT 0.4  --   --   GFRNONAA 48* 41* 42*  ANIONGAP 8 7 7      Hematology Recent Labs  Lab 05/01/21 0525 05/02/21 0451 05/03/21 0324  WBC 5.2 6.8 7.3  RBC 3.92 4.02 4.42  HGB 11.9* 12.1 13.3  HCT 35.5* 36.2 40.3  MCV 90.6 90.0 91.2  MCH 30.4 30.1 30.1  MCHC 33.5 33.4 33.0  RDW 16.7* 16.0* 16.3*  PLT  128* 121* 144*    Cardiac EnzymesNo results for input(s): TROPONINI in the last 168 hours. No results for input(s): TROPIPOC in the last 168 hours.   BNPNo results for input(s): BNP, PROBNP in the last 168 hours.   DDimer No results for input(s): DDIMER in the last 168 hours.   Radiology    No results found.  Cardiac Studies   none  Patient Profile     76 y.o. adult admitted with worsening CHF, severe LV dysfunction by MRI, sinus node dysfunction and PMVT and MMVT.   Assessment & Plan    Acute on chronic systolic heart failure -her symptoms are improved thought she got sob with walking in the halls this morning. Sinus node dysfunction - I have recommended DDD ICD Insertion.  PAF - she is out of rhythm. Her rates are slow with sleep, controlled with rest and fast with walking or any exertion. I have recommended DDD ICD followed by re-initiation of amiodarone.  For questions or updates, please contact [facility] Please consult [email or web address] for contact info under Cardiology/STEMI.      Signed, Cristopher Peru, MD  05/03/2021, 11:15 AM

## 2021-05-03 NOTE — Progress Notes (Signed)
Patient ID: Sheri Morgan, adult   DOB: [birth year], 76 y.o.   MRN: [id number]    Advanced Heart Failure Rounding Note   Subjective:    02/14: DBA stopped 02/16: Underwent DCCV AFL>>NSR 02/18: Recurrent AFL  Remains off dobutamine. CVP unhooked.   Remains in atrial flutter.  HR dropped to 30s overnight but now in 90s.  He was started back on po amiodarone yesterday.   MAP stable.   Objective:     Vital Signs:   Temp:  [97.4 F (36.3 C)-97.9 F (36.6 C)] 97.5 F (36.4 C) (02/19 0650) Resp:  [11-22] 14 (02/19 0600) BP: (74-156)/(50-92) 112/66 (02/19 0600) SpO2:  [97 %-100 %] 97 % (02/19 0400) Weight:  [88 kg] 88 kg (02/19 0500) Last BM Date : 04/29/21  Weight change: Filed Weights   04/30/21 0740 05/01/21 0500 05/03/21 0500  Weight: 88.5 kg 88.5 kg 88 kg    Intake/Output:   Intake/Output Summary (Last 24 hours) at 05/03/2021 0813 Last data filed at 05/03/2021 0700 Gross per 24 hour  Intake 570 ml  Output --  Net 570 ml    PHYSICAL EXAM: General: NAD Neck: No JVD, no thyromegaly or thyroid nodule.  Lungs: Clear to auscultation bilaterally with normal respiratory effort. CV: Nondisplaced PMI.  Heart irregular S1/S2, no S3/S4, no murmur.  No peripheral edema.   Abdomen: Soft, nontender, no hepatosplenomegaly, no distention.  Skin: Intact without lesions or rashes.  Neurologic: Alert and oriented x 3.  Psych: Normal affect. Extremities: No clubbing or cyanosis.  HEENT: Normal.    Telemetry: AFL 30s-90s (personally reviewed)    Labs: Basic Metabolic Panel: Recent Labs  Lab 04/27/21 0403 04/28/21 0425 04/29/21 0450 04/30/21 0524 05/01/21 0525 05/02/21 0451 05/03/21 0324  NA 138 138 136 138 137 138 137  K 3.7 4.6 4.5 4.1 4.5 4.1 4.3  CL 100 101 102 102 102 103 103  CO2 27 28 27 28 27 28 27   GLUCOSE 139* 156* 130* 94 126* 99 106*  BUN 18 24* 29* 30* 27* 25* 21  CREATININE 1.39* 1.35* 1.38* 1.36* 1.17* 1.34* 1.30*  CALCIUM 9.1 9.3 8.7* 8.6* 8.6*  8.5* 8.5*  MG 2.6* 2.6*  --  2.7*  --   --   --     Liver Function Tests: Recent Labs  Lab 05/01/21 0525  AST 17  ALT 17  ALKPHOS 71  BILITOT 0.4  PROT 5.7*  ALBUMIN 2.8*   No results for input(s): LIPASE, AMYLASE in the last 168 hours. No results for input(s): AMMONIA in the last 168 hours.  CBC: Recent Labs  Lab 04/28/21 0425 04/29/21 0450 05/01/21 0525 05/02/21 0451 05/03/21 0324  WBC 8.9 6.2 5.2 6.8 7.3  HGB 12.6 12.7 11.9* 12.1 13.3  HCT 39.2 37.9 35.5* 36.2 40.3  MCV 91.6 90.5 90.6 90.0 91.2  PLT 155 135* 128* 121* 144*    Cardiac Enzymes: No results for input(s): CKTOTAL, CKMB, CKMBINDEX, TROPONINI in the last 168 hours.  BNP: BNP (last 3 results) Recent Labs    04/23/21 2144  BNP 379.1*    ProBNP (last 3 results) No results for input(s): PROBNP in the last 8760 hours.    Other results:  Imaging: No results found.   Medications:     Scheduled Medications:  amiodarone  200 mg Oral BID   apixaban  5 mg Oral BID   calcium citrate  200 mg of elemental calcium Oral Daily   Chlorhexidine Gluconate Cloth  6 each Topical Daily  cholecalciferol  1,000 Units Oral Daily   dapagliflozin propanediol  10 mg Oral Daily   folic acid  1 mg Oral Daily   insulin aspart  0-9 Units Subcutaneous TID WC   levothyroxine  200 mcg Oral QAC breakfast   melatonin  3 mg Oral QHS   methotrexate  10 mg Oral Weekly   mexiletine  200 mg Oral Q12H   pantoprazole  40 mg Oral Daily   polyethylene glycol  17 g Oral Daily   predniSONE  30 mg Oral Q breakfast   rosuvastatin  40 mg Oral Daily   sacubitril-valsartan  1 tablet Oral BID   sodium chloride flush  10-40 mL Intracatheter Q12H   spironolactone  12.5 mg Oral Daily   sulfamethoxazole-trimethoprim  1 tablet Oral Q M,W,F    Infusions:    PRN Medications: acetaminophen, naphazoline-glycerin, nitroGLYCERIN, ondansetron (ZOFRAN) IV, senna-docusate, sodium chloride flush   Assessment/Plan:   1.  Acute on  chronic systolic HF due to NICM -> cardiogenic shock. Suspect cardiac sarcoid based on cardiac MRI.  - 5/22 ECHO EF 30-35% - Cath 2/23 minimal CAD. hstroponin 204 -> 379 - Echo 2/23: EF 20-25% - Off DBA, no co-ox today.  - Increase spironolactone to 25 mg daily  - Continue Farxiga 10 mg daily  - Continue Entresto 24/26 bid.  - cMRI EF 11% RVEF 15% LGE pattern concerning for sarcoid. (D/w Dr. Gardiner Rhyme)  - Continue mexiletine for PVCs.  - Had pulse-dose steroids. Now on prednisone.  - Now on full sarcoid protocol with MTX/folic acid/PJP prophylaxis   2. PVCs/NSVT/PMVT - EP following.  - did not tolerate amiodarone previously due to worsening bradycardia - off lido due to n/v that said PVC burden much decreased with lido - continue mexilitene 200 bid - on steroids. Plan as above - ACE level normal (52 U/L)  - Keep K> 4.0 Mg > 2.0    3. Paroxysmal Atrial Flutter - S/p DCCV 2/16 - Now back in AFL on 2/18.  - Off DBA - Continue apixaban 5 mg bid - Had issues with bradycardia with IV amiodarone.  Have restarted po amiodarone 200 mg bid. HR in 30s while asleep but 90s currently.  Seen by Dr. Lovena Le, recommends DDD ICD placement for backup pacing => potentially tomorrow, will make NPO at midnight.  - Would aim for ICD placement, amiodarone loading, then repeat DCCV.   4. Hypokalemia - resolved. K 4.3  Mobilize  Watch on 2H for now with amiodarone administration given prior bradycardia.   Loralie Champagne 05/03/2021 8:13 AM

## 2021-05-03 NOTE — Progress Notes (Signed)
During ambulation in the hallway patient went into SVT into the 180's. RN immediately placed patient back in bed and Dr. Shirlee Latch paged and made aware.

## 2021-05-04 ENCOUNTER — Inpatient Hospital Stay (HOSPITAL_COMMUNITY)
Admission: EM | Disposition: A | Discharge status: Home / Self Care | Payer: Self-pay | Source: Home / Self Care | Attending: Internal Medicine

## 2021-05-04 DIAGNOSIS — I428 Other cardiomyopathies: Secondary | ICD-10-CM | POA: Diagnosis not present

## 2021-05-04 DIAGNOSIS — I495 Sick sinus syndrome: Secondary | ICD-10-CM

## 2021-05-04 HISTORY — PX: ICD IMPLANT: EP1208

## 2021-05-04 LAB — BASIC METABOLIC PANEL
Anion gap: 8 (ref 5–15)
BUN: 25 mg/dL — ABNORMAL HIGH (ref 8–23)
CO2: 24 mmol/L (ref 22–32)
Calcium: 8.6 mg/dL — ABNORMAL LOW (ref 8.9–10.3)
Chloride: 104 mmol/L (ref 98–111)
Creatinine, Ser: 1.44 mg/dL — ABNORMAL HIGH (ref 0.44–1.00)
GFR, Estimated: 37 mL/min — ABNORMAL LOW (ref >60)
Glucose, Bld: 110 mg/dL — ABNORMAL HIGH (ref 70–99)
Potassium: 4.2 mmol/L (ref 3.5–5.1)
Sodium: 136 mmol/L (ref 135–145)

## 2021-05-04 LAB — SURGICAL PCR SCREEN
MRSA, PCR: NEGATIVE
Staphylococcus aureus: NEGATIVE

## 2021-05-04 LAB — GLUCOSE, CAPILLARY
Glucose-Capillary: 93 mg/dL (ref 70–99)
Glucose-Capillary: 93 mg/dL (ref 70–99)
Glucose-Capillary: 119 mg/dL — ABNORMAL HIGH (ref 70–99)
Glucose-Capillary: 137 mg/dL — ABNORMAL HIGH (ref 70–99)

## 2021-05-04 LAB — CBC
HCT: 40.7 % (ref 36.0–46.0)
Hemoglobin: 13.4 g/dL (ref 12.0–15.0)
MCH: 29.8 pg (ref 26.0–34.0)
MCHC: 32.9 g/dL (ref 30.0–36.0)
MCV: 90.6 fL (ref 80.0–100.0)
Platelets: 142 10*3/uL — ABNORMAL LOW (ref 150–400)
RBC: 4.49 MIL/uL (ref 3.87–5.11)
RDW: 16.6 % — ABNORMAL HIGH (ref 11.5–15.5)
WBC: 9.1 10*3/uL (ref 4.0–10.5)
nRBC: 0 % (ref 0.0–0.2)

## 2021-05-04 LAB — MAGNESIUM: Magnesium: 2.4 mg/dL (ref 1.7–2.4)

## 2021-05-04 SURGERY — ICD IMPLANT

## 2021-05-04 MED ORDER — SODIUM CHLORIDE 0.9 % IV SOLN
80.0000 mg | INTRAVENOUS | Status: AC
  Administered 2021-05-04: 80 mg
  Filled 2021-05-04: qty 2

## 2021-05-04 MED ORDER — SODIUM CHLORIDE 0.9 % IV SOLN: INTRAVENOUS | Status: DC

## 2021-05-04 MED ORDER — FENTANYL CITRATE (PF) 100 MCG/2ML IJ SOLN
INTRAMUSCULAR | Status: AC
  Filled 2021-05-04: qty 2

## 2021-05-04 MED ORDER — LIDOCAINE HCL (PF) 1 % IJ SOLN
INTRAMUSCULAR | Status: DC | PRN
  Administered 2021-05-04: 60 mL

## 2021-05-04 MED ORDER — LIDOCAINE HCL (PF) 1 % IJ SOLN
INTRAMUSCULAR | Status: AC
  Filled 2021-05-04: qty 60

## 2021-05-04 MED ORDER — HEPARIN (PORCINE) IN NACL 1000-0.9 UT/500ML-% IV SOLN
INTRAVENOUS | Status: AC
  Filled 2021-05-04: qty 500

## 2021-05-04 MED ORDER — AMIODARONE HCL 200 MG PO TABS
400.0000 mg | ORAL_TABLET | Freq: Two times a day (BID) | ORAL | Status: DC
  Administered 2021-05-04: 400 mg via ORAL
  Filled 2021-05-04 (×2): qty 2

## 2021-05-04 MED ORDER — SODIUM CHLORIDE 0.9 % IV SOLN
INTRAVENOUS | Status: AC
  Filled 2021-05-04: qty 2

## 2021-05-04 MED ORDER — MUPIROCIN 2 % EX OINT
1.0000 "application " | TOPICAL_OINTMENT | Freq: Two times a day (BID) | CUTANEOUS | Status: AC
  Administered 2021-05-04 – 2021-05-08 (×9): 1 via NASAL
  Filled 2021-05-04: qty 22

## 2021-05-04 MED ORDER — MIDAZOLAM HCL 5 MG/5ML IJ SOLN
INTRAMUSCULAR | Status: AC
  Filled 2021-05-04: qty 5

## 2021-05-04 MED ORDER — CEFAZOLIN SODIUM-DEXTROSE 2-4 GM/100ML-% IV SOLN
2.0000 g | INTRAVENOUS | Status: AC
  Administered 2021-05-04: 2 g via INTRAVENOUS
  Filled 2021-05-04: qty 100

## 2021-05-04 MED ORDER — MIDAZOLAM HCL 5 MG/5ML IJ SOLN
INTRAMUSCULAR | Status: DC | PRN
Start: 2021-05-04 — End: 2021-05-04
  Administered 2021-05-04: 1 mg via INTRAVENOUS
  Administered 2021-05-04: 2 mg via INTRAVENOUS

## 2021-05-04 MED ORDER — FENTANYL CITRATE (PF) 100 MCG/2ML IJ SOLN
INTRAMUSCULAR | Status: DC | PRN
Start: 2021-05-04 — End: 2021-05-04
  Administered 2021-05-04: 25 ug via INTRAVENOUS
  Administered 2021-05-04: 12.5 ug via INTRAVENOUS

## 2021-05-04 MED ORDER — IOHEXOL 350 MG/ML SOLN
INTRAVENOUS | Status: DC | PRN
  Administered 2021-05-04: 10 mL via INTRAVENOUS

## 2021-05-04 MED ORDER — METOPROLOL TARTRATE 5 MG/5ML IV SOLN
INTRAVENOUS | Status: AC
  Filled 2021-05-04: qty 5

## 2021-05-04 MED ORDER — METOPROLOL TARTRATE 5 MG/5ML IV SOLN
INTRAVENOUS | Status: DC | PRN
  Administered 2021-05-04: 5 mg via INTRAVENOUS

## 2021-05-04 SURGICAL SUPPLY — 9 items
CABLE SURGICAL S-101-97-12 (CABLE) ×2 IMPLANT
ICD ELLIPSE DR CD2411-36C (ICD Generator) ×1 IMPLANT
KIT MICROPUNCTURE NIT STIFF (SHEATH) ×1 IMPLANT
LEAD DURATA 7122-65CM (Lead) ×1 IMPLANT
LEAD TENDRIL MRI 52CM LPA1200M (Lead) ×1 IMPLANT
PAD DEFIB RADIO PHYSIO CONN (PAD) ×2 IMPLANT
SHEATH 7FR PRELUDE SNAP 13 (SHEATH) ×1 IMPLANT
SHEATH 8FR PRELUDE SNAP 13 (SHEATH) ×1 IMPLANT
TRAY PACEMAKER INSERTION (PACKS) ×2 IMPLANT

## 2021-05-04 NOTE — Progress Notes (Addendum)
Progress Note  Patient Name: Sheri Morgan Date of Encounter: 05/04/2021  Primary Cardiologist: Elouise Munroe, MD   Subjective   NAEO. Comfortable upright in chair this am.   Inpatient Medications    Scheduled Meds:  amiodarone  200 mg Oral BID   calcium citrate  200 mg of elemental calcium Oral Daily   Chlorhexidine Gluconate Cloth  6 each Topical Daily   cholecalciferol  1,000 Units Oral Daily   dapagliflozin propanediol  10 mg Oral Daily   folic acid  1 mg Oral Daily   gentamicin irrigation  80 mg Irrigation On Call   insulin aspart  0-9 Units Subcutaneous TID WC   levothyroxine  200 mcg Oral QAC breakfast   melatonin  3 mg Oral QHS   methotrexate  10 mg Oral Weekly   mexiletine  200 mg Oral Q12H   pantoprazole  40 mg Oral Daily   polyethylene glycol  17 g Oral Daily   predniSONE  30 mg Oral Q breakfast   rosuvastatin  40 mg Oral Daily   sacubitril-valsartan  1 tablet Oral BID   sodium chloride flush  10-40 mL Intracatheter Q12H   spironolactone  25 mg Oral Daily   sulfamethoxazole-trimethoprim  1 tablet Oral Q M,W,F   Continuous Infusions:  sodium chloride     sodium chloride      ceFAZolin (ANCEF) IV     PRN Meds: acetaminophen, naphazoline-glycerin, nitroGLYCERIN, ondansetron (ZOFRAN) IV, senna-docusate, sodium chloride flush   Vital Signs    Vitals:   05/04/21 0300 05/04/21 0535 05/04/21 0600 05/04/21 0655  BP: 112/70 (!) 123/95 (!) 126/98   Pulse:      Resp: 17 (!) 23 15   Temp: 97.6 F (36.4 C)   (!) 97.3 F (36.3 C)  TempSrc: Oral   Oral  SpO2:   93%   Weight:  88.6 kg    Height:        Intake/Output Summary (Last 24 hours) at 05/04/2021 0738 Last data filed at 05/03/2021 2100 Gross per 24 hour  Intake 610 ml  Output --  Net 610 ml    Filed Weights   05/01/21 0500 05/03/21 0500 05/04/21 0535  Weight: 88.5 kg 88 kg 88.6 kg    Telemetry    Atrial fib/flutter with variable rates. Personally reviewed.   ECG    None - Personally  Reviewed  Physical Exam   General: Pleasant, NAD. No resp difficulty Psych: Normal affect. HEENT:  Normal, without mass or lesion.         Neck: Supple, no bruits or JVD. Carotids 2+. No lymphadenopathy/thyromegaly appreciated. Heart: PMI nondisplaced. Irregularly irregular.  Lungs:  Resp regular and unlabored, CTA. Abdomen: Soft, non-tender, non-distended, No HSM, BS + x 4.   Extremities: No clubbing, cyanosis or edema. DP/PT/Radials 2+ and equal bilaterally. Neuro: Alert and oriented X 3. Moves all extremities spontaneously.   Labs    Chemistry    Hematology Recent Labs  Lab 05/02/21 0451 05/03/21 0324 05/04/21 0315  WBC 6.8 7.3 9.1  RBC 4.02 4.42 4.49  HGB 12.1 13.3 13.4  HCT 36.2 40.3 40.7  MCV 90.0 91.2 90.6  MCH 30.1 30.1 29.8  MCHC 33.4 33.0 32.9  RDW 16.0* 16.3* 16.6*  PLT 121* 144* 142*     Cardiac EnzymesNo results for input(s): TROPONINI in the last 168 hours. No results for input(s): TROPIPOC in the last 168 hours.   BNPNo results for input(s): BNP, PROBNP in the last 168 hours.  DDimer No results for input(s): DDIMER in the last 168 hours.   Radiology    No results found.  Cardiac Studies   none  Patient Profile     76 y.o. adult admitted with worsening CHF, severe LV dysfunction by MRI, sinus node dysfunction and PMVT and MMVT.   Assessment & Plan    Acute on chronic systolic CHF Echo this admit EF 20-25% Symptoms have overall improved.  For DDD ICD today  GDMT per HF team  2. SND Plan for DDD ICD today with Dr. Lovena Le.  Explained risks, benefits, and alternatives to ICD implantation, including but not limited to bleeding, infection, pneumothorax, pericardial effusion, lead dislodgement, heart attack, stroke, or death.  Pt verbalized understanding and agrees to proceed  3. PAF  Remains out of rhythm, rates are variable On amiodarone.  [place] will need to be held briefly for ICD implantation.  HF team wishes to get her back into rhythm  to help support CO.   For questions or updates, please contact [place] Please consult [email or web address] for contact info under Cardiology/STEMI.   Signed, Shirley Friar, PA-C  05/04/2021, 7:38 AM     EP Attending  Patient seen and examined. Agree with the findings as noted above. Her atrial fib is fairly well controlled. She will undergo ICD insertion. I have discussed with Dr. Reine Just as well as the patient's son and she wishes to proceed.  Carleene Overlie Maciah Feeback,MD

## 2021-05-04 NOTE — TOC Progression Note (Signed)
Transition of Care [facility]) - Progression Note    Patient Details  Name: Sheri Morgan MRN: [id number] Date of Birth: [birth year]  [facility] CM/SW Contact  Elliot Cousin, RN Phone Number: [phone] 05/04/2021, 1:29 PM  Clinical Narrative:     HF TOC CM spoke to pt at bedside. Pt states she was independent prior to hospitalization. States she lives in home with son and DIL. States she would like a RW with seat, cane and 3n1 bedside commode at home. Will order DME closer to dc date. Will continue to follow for dc needs.   Expected Discharge Plan: Home/Self Care Barriers to Discharge: Continued Medical Work up  Expected Discharge Plan and Services Expected Discharge Plan: Home/Self Care   Discharge Planning Services: CM Consult   Living arrangements for the past 2 months: Single Family Home                                       Social Determinants of Health (SDOH) Interventions    Readmission Risk Interventions No flowsheet data found.

## 2021-05-04 NOTE — Progress Notes (Addendum)
Patient ID: Sheri Morgan, adult   DOB: 12/24/1945, 76 y.o.   MRN: [id number]    Advanced Heart Failure Rounding Note   Subjective:    02/14: DBA stopped 02/16: Underwent DCCV AFL>>NSR 02/18: Recurrent AFL, restarted po amio  Remains off DBA.  CVP unhooked.  Remains in AFL. Rate controlled at rest, slow ventricular response in sleep hours and tachy with any exertion.   Going for DDD ICD with Dr. Hall Busing today.   Feels okay. Notes a little bit of dyspnea when up walking yesterday.     Objective:     Vital Signs:   Temp:  [97.3 F (36.3 C)-97.7 F (36.5 C)] 97.3 F (36.3 C) (02/20 0655) Resp:  [11-26] 15 (02/20 0600) BP: (105-170)/(53-144) 126/98 (02/20 0600) SpO2:  [93 %-94 %] 93 % (02/20 0600) Weight:  [88.6 kg] 88.6 kg (02/20 0535) Last BM Date : 04/29/21  Weight change: Filed Weights   05/01/21 0500 05/03/21 0500 05/04/21 0535  Weight: 88.5 kg 88 kg 88.6 kg    Intake/Output:   Intake/Output Summary (Last 24 hours) at 05/04/2021 0737 Last data filed at 05/03/2021 2100 Gross per 24 hour  Intake 610 ml  Output --  Net 610 ml    PHYSICAL EXAM: General:  Well appearing. No resp difficulty HEENT: normal Neck: supple. no JVD. Carotids 2+ bilat; no bruits.  Cor: PMI nondisplaced. Irregular rate & rhythm. No rubs, gallops or murmurs. Lungs: clear Abdomen: soft, nontender, nondistended. No hepatosplenomegaly.  Extremities: no cyanosis, clubbing, rash, edema Neuro: alert & orientedx3, cranial nerves grossly intact. moves all 4 extremities w/o difficulty. Affect pleasant    Telemetry: AFL 30s-100s (personally reviewed)    Labs: Basic Metabolic Panel: Recent Labs  Lab 04/28/21 0425 04/29/21 0450 04/30/21 0524 05/01/21 0525 05/02/21 0451 05/03/21 0324 05/04/21 0315  NA 138   < > 138 137 138 137 136  K 4.6   < > 4.1 4.5 4.1 4.3 4.2  CL 101   < > 102 102 103 103 104  CO2 28   < > 28 27 28 27 24   GLUCOSE 156*   < > 94 126* 99 106* 110*  BUN 24*   <  > 30* 27* 25* 21 25*  CREATININE 1.35*   < > 1.36* 1.17* 1.34* 1.30* 1.44*  CALCIUM 9.3   < > 8.6* 8.6* 8.5* 8.5* 8.6*  MG 2.6*  --  2.7*  --   --   --  2.4   < > = values in this interval not displayed.    Liver Function Tests: Recent Labs  Lab 05/01/21 0525  AST 17  ALT 17  ALKPHOS 71  BILITOT 0.4  PROT 5.7*  ALBUMIN 2.8*   No results for input(s): LIPASE, AMYLASE in the last 168 hours. No results for input(s): AMMONIA in the last 168 hours.  CBC: Recent Labs  Lab 04/29/21 0450 05/01/21 0525 05/02/21 0451 05/03/21 0324 05/04/21 0315  WBC 6.2 5.2 6.8 7.3 9.1  HGB 12.7 11.9* 12.1 13.3 13.4  HCT 37.9 35.5* 36.2 40.3 40.7  MCV 90.5 90.6 90.0 91.2 90.6  PLT 135* 128* 121* 144* 142*    Cardiac Enzymes: No results for input(s): CKTOTAL, CKMB, CKMBINDEX, TROPONINI in the last 168 hours.  BNP: BNP (last 3 results) Recent Labs    04/23/21 2144  BNP 379.1*    ProBNP (last 3 results) No results for input(s): PROBNP in the last 8760 hours.    Other results:  Imaging: No results found.  Medications:     Scheduled Medications:  amiodarone  200 mg Oral BID   calcium citrate  200 mg of elemental calcium Oral Daily   Chlorhexidine Gluconate Cloth  6 each Topical Daily   cholecalciferol  1,000 Units Oral Daily   dapagliflozin propanediol  10 mg Oral Daily   folic acid  1 mg Oral Daily   gentamicin irrigation  80 mg Irrigation On Call   insulin aspart  0-9 Units Subcutaneous TID WC   levothyroxine  200 mcg Oral QAC breakfast   melatonin  3 mg Oral QHS   methotrexate  10 mg Oral Weekly   mexiletine  200 mg Oral Q12H   pantoprazole  40 mg Oral Daily   polyethylene glycol  17 g Oral Daily   predniSONE  30 mg Oral Q breakfast   rosuvastatin  40 mg Oral Daily   sacubitril-valsartan  1 tablet Oral BID   sodium chloride flush  10-40 mL Intracatheter Q12H   spironolactone  25 mg Oral Daily   sulfamethoxazole-trimethoprim  1 tablet Oral Q M,W,F     Infusions:  sodium chloride     sodium chloride      ceFAZolin (ANCEF) IV       PRN Medications: acetaminophen, naphazoline-glycerin, nitroGLYCERIN, ondansetron (ZOFRAN) IV, senna-docusate, sodium chloride flush   Assessment/Plan:   1.  Acute on chronic systolic HF due to NICM -> cardiogenic shock. Suspect cardiac sarcoid based on cardiac MRI.  - 5/22 ECHO EF 30-35% - Cath 2/23 minimal CAD. hstroponin 204 -> 379 - Echo 2/23: EF 20-25% - Off DBA, no co-ox today.  - continue spironolactone 25 mg daily  - Continue Farxiga 10 mg daily  - Continue Entresto 24/26 bid.  - cMRI EF 11% RVEF 15% LGE pattern concerning for sarcoid. (D/w Dr. Gardiner Rhyme)  - Continue mexiletine for PVCs.  - Had pulse-dose steroids. Now on prednisone.  - Now on full sarcoid protocol with MTX/folic acid/PJP prophylaxis   2. PVCs/NSVT/PMVT - EP following.  - did not tolerate amiodarone previously due to worsening bradycardia - off lido due to n/v that said PVC burden much decreased with lido - continue mexilitene 200 bid - on steroids. Plan as above - ACE level normal (52 U/L)  - Keep K> 4.0 Mg > 2.0    3. Paroxysmal Atrial Flutter - S/p DCCV 2/16 - Now back in AFL on 2/18.  - Off DBA - Continue apixaban 5 mg bid - Had issues with bradycardia with IV amiodarone.  Have restarted po amiodarone 200 mg bid. HR in 30s while asleep, 80s-90s at rest this am. Tachy with activity.  Seen by Dr. Lovena Le, recommends DDD ICD placement for backup pacing => plan for device this afternoon - Would aim for ICD placement, amiodarone loading, then repeat DCCV.   4. Hypokalemia - resolved. K 4.2  Mobilize  PT/OT evaluated - no f/u necessary  [facility], LINDSAY N 05/04/2021 7:37 AM  Patient seen and examined with the above-signed Advanced Practice Provider and/or Housestaff. I personally reviewed laboratory data, imaging studies and relevant notes. I independently examined the patient and formulated the important  aspects of the plan. I have edited the note to reflect any of my changes or salient points. I have personally discussed the plan with the patient and/or family.  Had episodes of bradycardia over the weekend as well as runs of SVT. Was in AFL but now seems to be back in NSR  Denies CP or SOB. PVCs suppressed.   EP planning  dual-chamber ICD today to permit amio therapy  General:  Well appearing. No resp difficulty HEENT: normal Neck: supple. no JVD. Carotids 2+ bilat; no bruits. No lymphadenopathy or thryomegaly appreciated. Cor: PMI nondisplaced. Regular rate & rhythm. No rubs, gallops or murmurs. Lungs: clear Abdomen: soft, nontender, nondistended. No hepatosplenomegaly. No bruits or masses. Good bowel sounds. Extremities: no cyanosis, clubbing, rash, edema Neuro: alert & orientedx3, cranial nerves grossly intact. moves all 4 extremities w/o difficulty. Affect pleasant  Plan ICD today then continue to load amiodarone. Continue eliquis. Continue treatment for cardiac sarcoid and PVC suppression. Plan discussed in detail. D/w Dr. Lovena Le.   Glori Bickers, MD  11:25 AM

## 2021-05-04 NOTE — Progress Notes (Signed)
CARDIAC REHAB PHASE I   Checked on pt. Pt states some ambulation, denies leg weakness, but HR continues to be an issue. Plans for procedure today to hopefully help. Pt denies further questions or concerns at this time. Will continue to follow.  [id number] Rufina Falco, RN BSN 05/04/2021 3:01 PM

## 2021-05-05 ENCOUNTER — Inpatient Hospital Stay (HOSPITAL_COMMUNITY): Payer: Medicare Other

## 2021-05-05 ENCOUNTER — Other Ambulatory Visit: Payer: Self-pay

## 2021-05-05 ENCOUNTER — Encounter (HOSPITAL_COMMUNITY): Payer: Self-pay | Admitting: Internal Medicine

## 2021-05-05 DIAGNOSIS — I214 Non-ST elevation (NSTEMI) myocardial infarction: Secondary | ICD-10-CM | POA: Diagnosis not present

## 2021-05-05 LAB — CBC
HCT: 40.4 % (ref 36.0–46.0)
Hemoglobin: 13.7 g/dL (ref 12.0–15.0)
MCH: 30.5 pg (ref 26.0–34.0)
MCHC: 33.9 g/dL (ref 30.0–36.0)
MCV: 90 fL (ref 80.0–100.0)
Platelets: 133 10*3/uL — ABNORMAL LOW (ref 150–400)
RBC: 4.49 MIL/uL (ref 3.87–5.11)
RDW: 16.7 % — ABNORMAL HIGH (ref 11.5–15.5)
WBC: 10.1 10*3/uL (ref 4.0–10.5)
nRBC: 0 % (ref 0.0–0.2)

## 2021-05-05 LAB — BASIC METABOLIC PANEL
Anion gap: 11 (ref 5–15)
BUN: 26 mg/dL — ABNORMAL HIGH (ref 8–23)
CO2: 19 mmol/L — ABNORMAL LOW (ref 22–32)
Calcium: 8.4 mg/dL — ABNORMAL LOW (ref 8.9–10.3)
Chloride: 106 mmol/L (ref 98–111)
Creatinine, Ser: 1.47 mg/dL — ABNORMAL HIGH (ref 0.44–1.00)
GFR, Estimated: 37 mL/min — ABNORMAL LOW (ref >60)
Glucose, Bld: 90 mg/dL (ref 70–99)
Potassium: 4.7 mmol/L (ref 3.5–5.1)
Sodium: 136 mmol/L (ref 135–145)

## 2021-05-05 LAB — GLUCOSE, CAPILLARY
Glucose-Capillary: 86 mg/dL (ref 70–99)
Glucose-Capillary: 99 mg/dL (ref 70–99)
Glucose-Capillary: 134 mg/dL — ABNORMAL HIGH (ref 70–99)
Glucose-Capillary: 172 mg/dL — ABNORMAL HIGH (ref 70–99)

## 2021-05-05 MED ORDER — DAPAGLIFLOZIN PROPANEDIOL 10 MG PO TABS
10.0000 mg | ORAL_TABLET | Freq: Every day | ORAL | Status: DC
Start: 2021-05-06 — End: 2021-05-11
  Administered 2021-05-06 – 2021-05-11 (×6): 10 mg via ORAL
  Filled 2021-05-05 (×6): qty 1

## 2021-05-05 MED ORDER — AMIODARONE HCL IN DEXTROSE 360-4.14 MG/200ML-% IV SOLN
30.0000 mg/h | INTRAVENOUS | Status: DC
Start: 2021-05-05 — End: 2021-05-10
  Administered 2021-05-05 – 2021-05-10 (×10): 30 mg/h via INTRAVENOUS
  Filled 2021-05-05 (×11): qty 200

## 2021-05-05 MED ORDER — AMIODARONE HCL IN DEXTROSE 360-4.14 MG/200ML-% IV SOLN
60.0000 mg/h | INTRAVENOUS | Status: DC
  Administered 2021-05-05 (×2): 60 mg/h via INTRAVENOUS
  Filled 2021-05-05 (×2): qty 200

## 2021-05-05 MED ORDER — AMIODARONE LOAD VIA INFUSION
150.0000 mg | Freq: Once | INTRAVENOUS | Status: AC
  Administered 2021-05-05: 150 mg via INTRAVENOUS
  Filled 2021-05-05: qty 83.34

## 2021-05-05 MED ORDER — AMIODARONE HCL 200 MG PO TABS
200.0000 mg | ORAL_TABLET | Freq: Two times a day (BID) | ORAL | Status: DC
Start: 2021-05-05 — End: 2021-05-05
  Administered 2021-05-05: 200 mg via ORAL

## 2021-05-05 MED FILL — Heparin Sod (Porcine)-NaCl IV Soln 1000 Unit/500ML-0.9%: INTRAVENOUS | Qty: 500 | Status: AC

## 2021-05-05 NOTE — TOC CM/SW Note (Signed)
HF TOC CM contacted [place] for RW with seat and 3n1 bsc to be delivered to room. Possible dc scheduled for 05/06/2021.[place], Heart Failure TOC CM [phone]

## 2021-05-05 NOTE — Progress Notes (Signed)
CARDIAC REHAB PHASE I   Offered to walk with pt. Pt states she feels "off" at this time, BP checked, 93/75. Reviewed ICD site care and restrictions. Encouraged continued ambulation with emphasis on safety. Reviewed importance of daily weights, HF book in pts bag. Pt states she owns a BP cuff. Pt denies further questions or concerns at this time. Hopeful to d/c today. Will f/u to ambulate later if pt remains in hospital. Referred to CRP [facility] [place] Rufina Falco, RN BSN 05/05/2021 9:43 AM

## 2021-05-05 NOTE — Progress Notes (Addendum)
Patient ID: Sheri Morgan, adult   DOB: 28-Mar-1945, 76 y.o.   MRN: [id number]    Advanced Heart Failure Rounding Note   Subjective:    02/14: DBA stopped 02/16: Underwent DCCV AFL>>NSR 02/18: Recurrent AFL, restarted po amio 02/20: S/p dual chamber [employer] ICD placement  Remains off DBA.  Remains in AFL.   No PTX on CXR  No dyspnea. A little soreness at ICD pocket. Only complaint is "wooziness" with position changes.   Objective:     Vital Signs:   Temp:  [97.6 F (36.4 C)-98.9 F (37.2 C)] 98.7 F (37.1 C) (02/21 0450) Pulse Rate:  [59-123] 59 (02/21 0450) Resp:  [17-32] 18 (02/20 1648) BP: (98-160)/(58-117) 108/64 (02/21 0450) SpO2:  [93 %-100 %] 100 % (02/21 0450) Weight:  [89.1 kg] 89.1 kg (02/21 0450) Last BM Date : 05/05/21  Weight change: Filed Weights   05/03/21 0500 05/04/21 0535 05/05/21 0450  Weight: 88 kg 88.6 kg 89.1 kg    Intake/Output:   Intake/Output Summary (Last 24 hours) at 05/05/2021 1102 Last data filed at 05/04/2021 2053 Gross per 24 hour  Intake 429.15 ml  Output --  Net 429.15 ml    PHYSICAL EXAM: General:  No distress. Sitting up in bed. HEENT: normal Neck: supple. no JVD. Carotids 2+ bilat; no bruits.  Cor: PMI nondisplaced. Irregular rate & rhythm. No rubs, gallops or murmurs. ICD site stable, no hematoma. Lungs: clear Abdomen: soft, nontender, nondistended. No hepatosplenomegaly.  Extremities: no cyanosis, clubbing, rash, edema Neuro: alert & orientedx3, cranial nerves grossly intact. moves all 4 extremities w/o difficulty. Affect pleasant   Telemetry: AFL 60s, intermittently Vpaced    Labs: Basic Metabolic Panel: Recent Labs  Lab 04/30/21 0524 05/01/21 0525 05/02/21 0451 05/03/21 0324 05/04/21 0315 05/05/21 0459  NA 138 137 138 137 136 136  K 4.1 4.5 4.1 4.3 4.2 4.7  CL 102 102 103 103 104 106  CO2 28 27 28 27 24  19*  GLUCOSE 94 126* 99 106* 110* 90  BUN 30* 27* 25* 21 25* 26*  CREATININE 1.36* 1.17* 1.34*  1.30* 1.44* 1.47*  CALCIUM 8.6* 8.6* 8.5* 8.5* 8.6* 8.4*  MG 2.7*  --   --   --  2.4  --     Liver Function Tests: Recent Labs  Lab 05/01/21 0525  AST 17  ALT 17  ALKPHOS 71  BILITOT 0.4  PROT 5.7*  ALBUMIN 2.8*   No results for input(s): LIPASE, AMYLASE in the last 168 hours. No results for input(s): AMMONIA in the last 168 hours.  CBC: Recent Labs  Lab 05/01/21 0525 05/02/21 0451 05/03/21 0324 05/04/21 0315 05/05/21 0459  WBC 5.2 6.8 7.3 9.1 10.1  HGB 11.9* 12.1 13.3 13.4 13.7  HCT 35.5* 36.2 40.3 40.7 40.4  MCV 90.6 90.0 91.2 90.6 90.0  PLT 128* 121* 144* 142* 133*    Cardiac Enzymes: No results for input(s): CKTOTAL, CKMB, CKMBINDEX, TROPONINI in the last 168 hours.  BNP: BNP (last 3 results) Recent Labs    04/23/21 2144  BNP 379.1*    ProBNP (last 3 results) No results for input(s): PROBNP in the last 8760 hours.    Other results:  Imaging: DG Chest 2 View  Result Date: 05/05/2021 CLINICAL DATA:  Defibrillator implantation EXAM: CHEST - 2 VIEW COMPARISON:  04/25/2021 FINDINGS: No focal consolidation. No pleural effusion or pneumothorax. Heart and mediastinal contours are unremarkable. Interval placement of a dual lead cardiac pacemaker in satisfactory position. No acute osseous abnormality.  IMPRESSION: No active cardiopulmonary disease. Electronically Signed   By: Kathreen Devoid M.D.   On: 05/05/2021 10:24   EP PPM/ICD IMPLANT  Result Date: 05/04/2021 CONCLUSIONS:  1. Non-Ischemic cardiomyopathy with chronic New [place] Heart Association class III heart failure.  2. Successful ICD implantation.  3. No early apparent complications. ICD Criteria Current LVEF:15%. Within 12 months prior to implant: Yes Heart failure history: Yes, Class III Cardiomyopathy history: Yes, Non-Ischemic Cardiomyopathy. Atrial Fibrillation/Atrial Flutter: Yes, Persistent (> 7 Days). Ventricular tachycardia history: No. Cardiac arrest history: No. History of syndromes with risk of  sudden death: No. Previous ICD: No. Current ICD indication: Primary PPM indication: No.  Class I or II Bradycardia indication present: Yes Beta Blocker therapy for 3 or more months: Yes, prescribed. Ace Inhibitor/ARB therapy for 3 or more months: Yes, prescribed. Cristopher Peru, MD     Medications:     Scheduled Medications:  amiodarone  200 mg Oral BID   calcium citrate  200 mg of elemental calcium Oral Daily   Chlorhexidine Gluconate Cloth  6 each Topical Daily   cholecalciferol  1,000 Units Oral Daily   dapagliflozin propanediol  10 mg Oral Daily   folic acid  1 mg Oral Daily   insulin aspart  0-9 Units Subcutaneous TID WC   levothyroxine  200 mcg Oral QAC breakfast   melatonin  3 mg Oral QHS   methotrexate  10 mg Oral Weekly   mupirocin ointment  1 application Nasal BID   pantoprazole  40 mg Oral Daily   polyethylene glycol  17 g Oral Daily   predniSONE  30 mg Oral Q breakfast   rosuvastatin  40 mg Oral Daily   sacubitril-valsartan  1 tablet Oral BID   sodium chloride flush  10-40 mL Intracatheter Q12H   spironolactone  25 mg Oral Daily   sulfamethoxazole-trimethoprim  1 tablet Oral Q M,W,F    Infusions:     PRN Medications: acetaminophen, naphazoline-glycerin, nitroGLYCERIN, ondansetron (ZOFRAN) IV, senna-docusate, sodium chloride flush   Assessment/Plan:   1.  Acute on chronic systolic HF due to NICM -> cardiogenic shock. Suspect cardiac sarcoid based on cardiac MRI.  - 5/22 ECHO EF 30-35% - Cath 2/23 minimal CAD. hstroponin 204 -> 379 - Echo 2/23: EF 20-25% - cMRI EF 11% RVEF 15% LGE pattern concerning for sarcoid. (D/w Dr. Gardiner Rhyme)  - Off DBA - continue spironolactone 25 mg daily  - Continue Farxiga 10 mg daily  - Continue Entresto 24/26 bid.  - Daily BMET - Check orthostatics d/t positional dizziness. SBP 100s. - Continue mexiletine for PVCs.  - Had pulse-dose steroids. Now on prednisone.  - Now on full sarcoid protocol with MTX/folic acid/PJP prophylaxis    2. PVCs/NSVT/PMVT - EP following.  - did not tolerate amiodarone previously due to worsening bradycardia - off lido due to n/v that said PVC burden much decreased with lido - mexiletine discontinued this am per EP - Loading with IV amio  - S/p ICD 02/23 - on steroids. Plan as above - ACE level normal (52 U/L)  - Keep K> 4.0 Mg > 2.0    3. Paroxysmal Atrial Flutter - S/p DCCV 2/16 - Now back in AFL on 2/18.  - Off DBA - Restart Eliquis tomorrow evening per EP - Would ideally like to restore SR prior to discharge. Now that she has ICD, will load with IV amiodarone and plan for TEE/DCCV on Friday, 02/24  4. Hypokalemia - resolved. K 4.7    Mobilize  PT/OT  evaluated - no f/u necessary    ADDENDUM 5:03 PM:  Patient more hypotensive this afternoon with SBP down to 70s-80s/50s. Last BP 104/66. ? if d/t amiodarone bolus which was given around 1:30 pm. Will stop entresto and spiro for now.  FINCH, LINDSAY N 05/05/2021 11:02 AM  Patient seen and examined with the above-signed Advanced Practice Provider and/or Housestaff. I personally reviewed laboratory data, imaging studies and relevant notes. I independently examined the patient and formulated the important aspects of the plan. I have edited the note to reflect any of my changes or salient points. I have personally discussed the plan with the patient and/or family.  S/p ICD yesterday. Site a bit sore. Remains in AFL. Breathing ok. BP remains soft. Remains on steroids for sarcoid CM  General:  Lying in bed . No resp difficulty HEENT: normal Neck: supple. no JVD. Carotids 2+ bilat; no bruits. No lymphadenopathy or thryomegaly appreciated. Cor: PMI nondisplaced. Irregular rate & rhythm. No rubs, gallops or murmurs. ICD site ok Lungs: clear Abdomen: soft, nontender, nondistended. No hepatosplenomegaly. No bruits or masses. Good bowel sounds. Extremities: no cyanosis, clubbing, rash, edema Neuro: alert & orientedx3, cranial nerves  grossly intact. moves all 4 extremities w/o difficulty. Affect pleasant  Remains in AFL. Now with back-up pacing. Will start IV amio. BP soft. Will hold Entresto and spiro. Can give 500cc NS as needed. Restart Eliquis tomorrow with plan for TEE/DC-CV on Friday.   Glori Bickers, MD  5:41 PM

## 2021-05-05 NOTE — Progress Notes (Signed)
Physical Therapy Treatment Patient Details Name: Sheri Morgan MRN: [id number] DOB: [birth year] Today's Date: 05/05/2021   History of Present Illness 76 y.o. female who presented 04/23/21 with SOB and chest pain with exertion and then sudden chest pain at rest with nausea and emesis on 2/9. 2/9 cardiac cath with VT,non-obstructive CAD. S/p cardioversion 2/16. Pt with cardiac sarcoid, progressive CHF with LV dysfunction. 2/20 DDD ICD placement. PMH: atypical atrial flutter, fibromyalgia, HTN, HFrEF, Afib    PT Comments    Pt moving well with decreased gait speed and endurance post ICD placement. Pt with education for precautions, transfers and mobility. Pt with sling applied end of session as pt stated she is having trouble recalling precautions. Pt educated for progressive mobility and ICD progressive movement. Will continue to follow and encouraged daily ambulation.   HR 64-92    Recommendations for follow up therapy are one component of a multi-disciplinary discharge planning process, led by the attending physician.  Recommendations may be updated based on patient status, additional functional criteria and insurance authorization.  Follow Up Recommendations  No PT follow up     Assistance Recommended at Discharge PRN  Patient can return home with the following Assistance with cooking/housework;Help with stairs or ramp for entrance   Equipment Recommendations  None recommended by PT    Recommendations for Other Services       Precautions / Restrictions Precautions Precautions: Fall;ICD/Pacemaker Restrictions Weight Bearing Restrictions: No     Mobility  Bed Mobility Overal bed mobility: Needs Assistance Bed Mobility: Rolling, Sidelying to Sit Rolling: Supervision Sidelying to sit: Supervision       General bed mobility comments: cues for safety to roll to right and rise, pt maintaining precautions    Transfers Overall transfer level: Modified independent                       Ambulation/Gait Ambulation/Gait assistance: Min guard Gait Distance (Feet): 300 Feet Assistive device: None Gait Pattern/deviations: Step-through pattern, Decreased stride length Gait velocity: 36' in 45 sec=.8 ft/sec Gait velocity interpretation: <1.31 ft/sec, indicative of household ambulator   General Gait Details: pt with slow gait with cautious steps   Stairs Stairs: Yes Stairs assistance: Min assist Stair Management: Step to pattern, Forwards Number of Stairs: 2 General stair comments: hand held assist to ascend/descend as no rail at home   Wheelchair Mobility    Modified Rankin (Stroke Patients Only)       Balance Overall balance assessment: Mild deficits observed, not formally tested                                          Cognition Arousal/Alertness: Awake/alert Behavior During Therapy: WFL for tasks assessed/performed Overall Cognitive Status: Within Functional Limits for tasks assessed                                          Exercises General Exercises - Lower Extremity Long Arc Quad: AROM, Both, Seated, 20 reps Hip Flexion/Marching: AROM, Both, 15 reps, Seated    General Comments        Pertinent Vitals/Pain Pain Assessment Pain Assessment: No/denies pain    Home Living Family/patient expects to be discharged to:: Private residence Living Arrangements: Children  Prior Function            PT Goals (current goals can now be found in the care plan section) Progress towards PT goals: Progressing toward goals    Frequency    Min 2X/week      PT Plan Current plan remains appropriate    Co-evaluation              AM-PAC PT "6 Clicks" Mobility   Outcome Measure  Help needed turning from your back to your side while in a flat bed without using bedrails?: None Help needed moving from lying on your back to sitting on the side of a flat bed without  using bedrails?: None Help needed moving to and from a bed to a chair (including a wheelchair)?: A Little Help needed standing up from a chair using your arms (e.g., wheelchair or bedside chair)?: A Little Help needed to walk in hospital room?: A Little Help needed climbing 3-5 steps with a railing? : A Little 6 Click Score: 20    End of Session   Activity Tolerance: Patient tolerated treatment well Patient left: in chair;with call bell/phone within reach Nurse Communication: Mobility status PT Visit Diagnosis: Other abnormalities of gait and mobility (R26.89)     Time: [id number] PT Time Calculation (min) (ACUTE ONLY): 24 min  Charges:  $Gait Training: 8-22 mins $Therapeutic Exercise: 8-22 mins                     Jerami Tammen P, PT Acute Rehabilitation Services Pager: [phone] Office: [facility] 05/05/2021, 11:52 AM

## 2021-05-05 NOTE — Care Management Important Message (Signed)
Important Message  Patient Details  Name: Sheri Morgan MRN: [id number] Date of Birth: [birth year]   Medicare Important Message Given:  Yes     [name] 05/05/2021, 9:02 AM

## 2021-05-05 NOTE — Discharge Summary (Addendum)
Advanced Heart Failure Team  Discharge Summary   Patient ID: Sheri Morgan MRN: [id number], DOB/AGE: 78-Jul-1947 78 y.o. Admit date: 04/23/2021 D/C date:     05/11/2021   Primary Discharge Diagnoses:  Acute on Chronic Systolic CHF Cardiogenic Shock - Resolved Non-Ischemic Cardiomyopathy Cardiac Sarcoidosis Non-Sustained VT/ PMVT/ PVCs Paroxysmal Atrial Flutter S/p ICD AKI on CKD 3b   Hospital Course:  Sheri Morgan is a 76 year old obese female with a history of chronic systolic CHF with EF of 70-01%, paroxysmal atrial fibrillation not compliant with Eliquis prior to admission, PVCs, hypertension, and fibromyalgia. She was previously seen by Dr. Donata Clay in [place] and then established with Dr. Margaretann Loveless in 04/2020 for recurrent atrial fibrillation. Echo in 07/2020 showed LVEF of 30-35% which was new. Patient was lost to follow-up after this.  Patient was admitted on 04/23/2021 with chest pain and worsening shortness of breath. Labs were notable for high-sensitivity troponin of 204 >> 379 and BNP of 379.1. CTA was negative for aortic dissection. She underwent cardiac catheterization on 2/9 which showed mild non-obstructive CAD with only 30% stenosis of proximal LAD and elevated LVEDP. Echo showed LVEF of 20-25% with global hypokinesis, mildly enlarged RV with moderately reduced systolic function, mild to moderate MR, and moderately elevated RVSP of 54.0 mmHg.   Developed VT and was started on Amiodarone but had more bradycardia >> PMVT (brady mediated). This was complicated by hypotension and cardiogenic shock requiring Dobutamine which was weaned off on 2/14. EP was consulted and she was started on Lidocaine with reduced PVC burden but this was later stopped due to nausea/vomiting. Subsequently, she was started on Mexiletine but this was ultimately stopped as well. She then developed atrial flutter with RVR on 2/14. She underwent DCCV on 2/16 which was initially successful but then she had recurrent  atrial flutter on 2/18. Due to bradycardia and need for Amiodarone, she underwent placement of dual chamber ICD on 2/20. She was then loaded with IV Amiodarone and underwent repeat TEE/DCCV on 2/24. No recurrent atrial arrhythmias prior to discharge. Kept on amiodarone for PVCs and mexiletine restarted when PVCs not fully suppressed.  CMRI raised concern for cardiac sarcoidosis. Started on treatment per PharmD protocol (prednisone + methotrexate + bactrim/folic acid/calcium citrate/vitaminD, protonix).  She will follow-up with PharmD for CHF and sarcoid medication titration 05/20/21. Follow-up with HF APP clinic 05/27/21.  Scheduled for follow-up in device clinic for wound check on 03/02.  Seen by PT/OT. No needs identified.   Hospital Course by Problem: 1.  Acute on chronic systolic HF due to NICM -> cardiogenic shock. Suspect cardiac sarcoid based on cardiac MRI.  - 5/22 ECHO EF 30-35% - Cath 2/23 minimal CAD. hstroponin 204 -> 379 - Echo 2/23: EF 20-25% - cMRI EF 11% RVEF 15% LGE pattern concerning for sarcoid. (D/w Dr. Gardiner Rhyme)  - Only 2 small areas of sarcoid on cMRI. Suspect EF down mostly due to frequent PVCs. Should recover with suppression - Initially on DBA for support. Now off - Diuresed with intermittent IV lasix. Last dose 02/25. Volume stable today. PRN lasix at discharge. - Continue Farxiga 10 - Off Delene Loll and Arlyce Harman on hold w/ soft BP and AKI/hyperkalemia - Holding b-blocker with bradycardia/low output - Would like to add losartan 12.5 if K remains stable. Will wait until clinic    2. Cardiac sarcoidosis - based on cMRI and clinical course - ACE level normal (52 U/L). CT chest. No extra-cardiac sarcoid Immunosuppressive Therapy for Cardiac Sarcoid  Prednisone (04/29/21) Weeks  1-4: 30 mg daily  Methotrexate  (04/30/21 & 2/23/223) Weeks 1&2: 10 mg (four 2.5 mg tablets) once weekly  Weeks 3&4 starting 05/14/21 : 12.5 mg (five 2.5 mg tablets) once weekly Weeks 5&6: 15 mg  (six 2.5 mg tablets) once weekly  Weeks 7&8: 17.5 mg (seven 2.5 mg tablets) once weekly Weeks 9+:  20 mg (eight 2.5 mg tablets ) once weekly  Additional Medications  Bactrim DS : 1 tablet Mon-Wed- Fri while taking >15 mg of prednisone daily  Folic Acid : 1 mg daily  Calcium Citrate -- switch to twice a day   Vitamin D: 1000 IU total daily  On protonix 40 mg daily while taking any dose of prednisone.      3. PVCs/NSVT/PMVT - EP following.  - did not tolerate amiodarone previously due to worsening bradycardia - off lido due to n/v that said PVC burden much decreased with lido - mexiletine discontinued 2/21 per EP - S/p ICD 02/23 - On po amio 200 mg daily.  - Added back mexiletine 200 mg BID on 02/26 - PVC burden improved, down to 2-5/min   4. Paroxysmal Atrial Flutter - S/p DCCV 2/16 -> back in [facility] on 2/18.  - Continue Eliquis  - s/p TEE/DCCV t2/24  - Apaced today - Continue amio   4. Hypokalemia/hyperkalemia - Stable today   5. AKI - SCr 1.47>>1.60>>1.74> 1.35 -> 1.6>1.45 - BP ok  - continue to hold Entresto and Arlyce Harman  - encouraged PO intake     Discharge Weight Range: 202 lb >>191 lb  Discharge Vitals: Blood pressure 124/85, pulse 68, temperature (!) 97.4 F (36.3 C), temperature source Oral, resp. rate 17, height 5' 3"  (1.6 m), weight 87 kg, SpO2 98 %.  Labs: Lab Results  Component Value Date   WBC 8.6 05/11/2021   HGB 11.9 (L) 05/11/2021   HCT 35.0 (L) 05/11/2021   MCV 89.5 05/11/2021   PLT 92 (L) 05/11/2021    Recent Labs  Lab 05/11/21 0342  NA 138  K 4.2  CL 104  CO2 27  BUN 24*  CREATININE 1.45*  CALCIUM 9.0  GLUCOSE 91   Lab Results  Component Value Date   CHOL 253 (H) 04/23/2021   HDL 101 04/23/2021   LDLCALC 141 (H) 04/23/2021   TRIG 53 04/23/2021   BNP (last 3 results) Recent Labs    04/23/21 2144  BNP 379.1*    ProBNP (last 3 results) No results for input(s): PROBNP in the last 8760 hours.   Diagnostic Studies/Procedures    LHC, 04/23/21:   Prox LAD lesion is 30% stenosed.   There is moderate left ventricular systolic dysfunction.   LV end diastolic pressure is severely elevated.   The left ventricular ejection fraction is 35-45% by visual estimate.   Mild non-obstructive CAD Elevated LVEDP LV wall motion abnormality noted with hand injection. (no power injection of the LV given elevated filling pressures). This may represent a Takotsubo's cardiomyopathy.  Ventricular tachycardia prior to cath.   Echo, 04/24/21: IMPRESSIONS   1. No left ventricular thrombus is seen. Left ventricular ejection fraction, by estimation, is 20 to 25%. The left ventricle has severely  decreased function. The left ventricle demonstrates global hypokinesis. The left ventricular internal cavity size was mildly dilated. Left ventricular diastolic function could not be evaluated.   2. Right ventricular systolic function is moderately reduced. The right ventricular size is mildly enlarged. There is moderately elevated pulmonary artery systolic pressure. The estimated right ventricular systolic pressure  is 54.0 mmHg.   3. Left atrial size was severely dilated.   4. The mitral valve is normal in structure. Mild to moderate mitral valve regurgitation.   5. The aortic valve is tricuspid. There is mild thickening of the aortic valve. Aortic valve regurgitation is not visualized. Aortic valve  sclerosis is present, with no evidence of aortic valve stenosis.   6. The inferior vena cava is normal in size with greater than 50% respiratory variability, suggesting right atrial pressure of 3 mmHg.    cMRI, 04/28/2021: IMPRESSION: Severely reduced LV function   There is apical LGE.   Though the 2018 [facility] Criteria is met for myocarditis, areas of T2 elevated, LGE, and ECV signal are atypical for myocarditis.   LGE is focal, not in the septum, and without RV involvement. This is less consistent with the pattern seen in  cardiac sarcoidosis.   ICD implant 05/04/21: CONCLUSIONS:   1. Non-Ischemic cardiomyopathy with chronic New York Heart Association class III heart failure.   2. Successful ICD implantation.   3. No early apparent complications.     Discharge Medications   Allergies as of 05/11/2021       Reactions   Codeine Itching        Medication List     STOP taking these medications    gabapentin 300 MG capsule Commonly known as: Neurontin   hydrochlorothiazide 25 MG tablet Commonly known as: HYDRODIURIL   LORazepam 0.5 MG tablet Commonly known as: ATIVAN   predniSONE 10 MG (21) Tbpk tablet Commonly known as: STERAPRED UNI-PAK 21 TAB Replaced by: predniSONE 10 MG tablet       TAKE these medications    amiodarone 200 MG tablet Commonly known as: PACERONE Take 1 tablet (200 mg total) by mouth daily. Start taking on: May 12, 2021   apixaban 5 MG Tabs tablet Commonly known as: ELIQUIS Take 1 tablet (5 mg total) by mouth 2 (two) times daily.   calcium citrate 950 (200 Ca) MG tablet Commonly known as: CALCITRATE - dosed in mg elemental calcium Take 1 tablet (200 mg of elemental calcium total) by mouth 2 (two) times daily.   dapagliflozin propanediol 10 MG Tabs tablet Commonly known as: FARXIGA Take 1 tablet (10 mg total) by mouth daily.   folic acid 1 MG tablet Commonly known as: FOLVITE Take 1 tablet (1 mg total) by mouth daily. Start taking on: May 12, 2021   furosemide 40 MG tablet Commonly known as: Lasix Take 1 tablet as needed for weight gain > 3 lb in a day or 5 lb in a week, leg edema, shortness of breath   levothyroxine 200 MCG tablet Commonly known as: SYNTHROID Take 1 tablet (200 mcg total) by mouth daily before breakfast. What changed: Another medication with the same name was removed. Continue taking this medication, and follow the directions you see here.   methotrexate 2.5 MG tablet Commonly known as: RHEUMATREX Take 12.5 mg (5  tablets) once a week X 2 weeks starting 03/02 then as directed by HF clinic  Caution:Chemotherapy. Protect from light.   mexiletine 200 MG capsule Commonly known as: MEXITIL Take 1 capsule (200 mg total) by mouth 2 (two) times daily.   pantoprazole 40 MG tablet Commonly known as: PROTONIX Take 1 tablet (40 mg total) by mouth daily. Start taking on: May 12, 2021   potassium chloride SA 20 MEQ tablet Commonly known as: KLOR-CON M Take 1 tablet when taking furosemide   prednisoLONE acetate  1 % ophthalmic suspension Commonly known as: PRED FORTE Place 1 drop into the left eye every evening.   predniSONE 10 MG tablet Commonly known as: DELTASONE Take 3 tablets (30 mg total) by mouth daily with breakfast. Start taking on: May 12, 2021 Replaces: predniSONE 10 MG (21) Tbpk tablet   rosuvastatin 40 MG tablet Commonly known as: CRESTOR Take 1 tablet (40 mg total) by mouth daily. What changed:  medication strength how much to take   sulfamethoxazole-trimethoprim 800-160 MG tablet Commonly known as: BACTRIM DS Take 1 tablet by mouth every Monday, Wednesday, and Friday. Start taking on: May 13, 2021   Vitamin D3 25 MCG tablet Commonly known as: Vitamin D Take 1 tablet (1,000 Units total) by mouth daily. Start taking on: May 12, 2021               Durable Medical Equipment  (From admission, onward)           Start     Ordered   05/04/21 1336  For home use only DME 3 n 1  Once        05/04/21 1335   05/04/21 1335  For home use only DME 4 wheeled rolling walker with seat  Once       Question:  Patient needs a walker to treat with the following condition  Answer:  HF (heart failure), systolic ([place])   49/44/96 1335            Disposition   The patient will be discharged in stable condition to home. Discharge Instructions     (HEART FAILURE PATIENTS) Call MD:  Anytime you have any of the following symptoms: 1) 3 pound weight gain in 24 hours  or 5 pounds in 1 week 2) shortness of breath, with or without a dry hacking cough 3) swelling in the hands, feet or stomach 4) if you have to sleep on extra pillows at night in order to breathe.   Complete by: As directed    (HEART FAILURE PATIENTS) Call MD:  Anytime you have any of the following symptoms: 1) 3 pound weight gain in 24 hours or 5 pounds in 1 week 2) shortness of breath, with or without a dry hacking cough 3) swelling in the hands, feet or stomach 4) if you have to sleep on extra pillows at night in order to breathe.   Complete by: As directed    Amb Referral to Cardiac Rehabilitation   Complete by: As directed    Diagnosis: NSTEMI   After initial evaluation and assessments completed: Virtual Based Care may be provided alone or in conjunction with Phase 2 Cardiac Rehab based on patient barriers.: Yes   Diet - low sodium heart healthy   Complete by: As directed    Diet - low sodium heart healthy   Complete by: As directed    Heart Failure patients record your daily weight using the same scale at the same time of day   Complete by: As directed    Heart Failure patients record your daily weight using the same scale at the same time of day   Complete by: As directed    Increase activity slowly   Complete by: As directed    Increase activity slowly   Complete by: As directed        Follow-up Information     [facility] Follow up on 05/27/2021.   Specialty: Cardiology Why: Advanced Heart Failure Clinic at  [place] 11:30 am entrance C, garage code [employer] information: [address] [id number] [place] [facility] Follow up on 05/20/2021.   Specialty: Cardiology Why: Heart Failure Pharmacist -Advanced Heart Failure Clinic at 2 pm Entrance C, Garage Code [address] Contact information: [address] [id number] [place] [phone]        [facility] Follow up on 05/14/2021.   Specialty: Cardiology Why: 10 am for ICD wound check Contact information: [address] [place] [phone]                  Duration of Discharge Encounter: Greater than 35 minutes   Signed, Leata Mouse  05/11/2021, 12:35 PM   Patient seen and examined with the above-signed Advanced Practice Provider and/or Housestaff. I personally reviewed laboratory data, imaging studies and relevant notes. I independently examined the patient and formulated the important aspects of the plan. I have edited the note to reflect any of my changes or salient points. I have personally discussed the plan with the patient and/or family.  She is improved. [place] for d/c today with close f/u.   Glori Bickers, MD  11:33 AM

## 2021-05-05 NOTE — Progress Notes (Addendum)
Electrophysiology Rounding Note  Patient Name: Sheri Morgan Date of Encounter: 05/05/2021  Primary Cardiologist: Elouise Munroe, MD Electrophysiologist: Dr. Lovena Le   Subjective   S/p St Jude DDD ICD 05/04/2020  The patient is doing well today.  At this time, the patient denies chest pain, shortness of breath, or any new concerns.  Inpatient Medications    Scheduled Meds:  amiodarone  400 mg Oral BID   calcium citrate  200 mg of elemental calcium Oral Daily   Chlorhexidine Gluconate Cloth  6 each Topical Daily   cholecalciferol  1,000 Units Oral Daily   dapagliflozin propanediol  10 mg Oral Daily   folic acid  1 mg Oral Daily   insulin aspart  0-9 Units Subcutaneous TID WC   levothyroxine  200 mcg Oral QAC breakfast   melatonin  3 mg Oral QHS   methotrexate  10 mg Oral Weekly   mexiletine  200 mg Oral Q12H   mupirocin ointment  1 application Nasal BID   pantoprazole  40 mg Oral Daily   polyethylene glycol  17 g Oral Daily   predniSONE  30 mg Oral Q breakfast   rosuvastatin  40 mg Oral Daily   sacubitril-valsartan  1 tablet Oral BID   sodium chloride flush  10-40 mL Intracatheter Q12H   spironolactone  25 mg Oral Daily   sulfamethoxazole-trimethoprim  1 tablet Oral Q M,W,F   Continuous Infusions:  PRN Meds: acetaminophen, naphazoline-glycerin, nitroGLYCERIN, ondansetron (ZOFRAN) IV, senna-docusate, sodium chloride flush   Vital Signs    Vitals:   05/04/21 1648 05/04/21 2053 05/04/21 2357 05/05/21 0450  BP: 129/68 102/62 100/67 108/64  Pulse: 62 68 60 (!) 59  Resp: 18     Temp: 98.7 F (37.1 C) 98.9 F (37.2 C) 98.8 F (37.1 C) 98.7 F (37.1 C)  TempSrc: Oral Oral Oral Oral  SpO2: 97% 97% 98% 100%  Weight:    89.1 kg  Height:        Intake/Output Summary (Last 24 hours) at 05/05/2021 0754 Last data filed at 05/04/2021 2053 Gross per 24 hour  Intake 529.15 ml  Output --  Net 529.15 ml   Filed Weights   05/03/21 0500 05/04/21 0535 05/05/21 0450   Weight: 88 kg 88.6 kg 89.1 kg    Physical Exam    GEN- The patient is well appearing, alert and oriented x 3 today.   Head- normocephalic, atraumatic Eyes-  Sclera clear, conjunctiva pink Ears- hearing intact Oropharynx- clear Neck- supple Lungs- Clear to ausculation bilaterally, normal work of breathing Heart- Regular rate and rhythm (Mode switch V pacing), no murmurs, rubs or gallops GI- soft, NT, ND, + BS Extremities- no clubbing or cyanosis. No edema Skin- no rash or lesion Psych- euthymic mood, full affect Neuro- strength and sensation are intact  Labs    CBC Recent Labs    05/04/21 0315 05/05/21 0459  WBC 9.1 10.1  HGB 13.4 13.7  HCT 40.7 40.4  MCV 90.6 90.0  PLT 142* [id number]*   Basic Metabolic Panel Recent Labs    05/04/21 0315 05/05/21 0459  NA 136 136  K 4.2 4.7  CL 104 106  CO2 24 19*  GLUCOSE 110* 90  BUN 25* 26*  CREATININE 1.44* 1.47*  CALCIUM 8.6* 8.4*  MG 2.4  --    Liver Function Tests No results for input(s): AST, ALT, ALKPHOS, BILITOT, PROT, ALBUMIN in the last 72 hours. No results for input(s): LIPASE, AMYLASE in the last 72 hours.  Cardiac Enzymes No results for input(s): CKTOTAL, CKMB, CKMBINDEX, TROPONINI in the last 72 hours.   Telemetry    V pacing 60-70s overnight (personally reviewed)  Radiology    EP PPM/ICD IMPLANT  Result Date: 05/04/2021 CONCLUSIONS:  1. Non-Ischemic cardiomyopathy with chronic New York Heart Association class III heart failure.  2. Successful ICD implantation.  3. No early apparent complications. ICD Criteria Current LVEF:15%. Within 12 months prior to implant: Yes Heart failure history: Yes, Class III Cardiomyopathy history: Yes, Non-Ischemic Cardiomyopathy. Atrial Fibrillation/Atrial Flutter: Yes, Persistent (> 7 Days). Ventricular tachycardia history: No. Cardiac arrest history: No. History of syndromes with risk of sudden death: No. Previous ICD: No. Current ICD indication: Primary PPM indication: No.   Class I or II Bradycardia indication present: Yes Beta Blocker therapy for 3 or more months: Yes, prescribed. Ace Inhibitor/ARB therapy for 3 or more months: Yes, prescribed. Cristopher Peru, MD    Patient Profile     76 y.o. adult admitted with worsening CHF, severe LV dysfunction by MRI, sinus node dysfunction and PMVT and MMVT.   Assessment & Plan    Acute on chronic systolic CHF Echo this admit EF 20-25% Symptoms have overall improved.  GDMT per HF team   2. SND S/p ST Jude DDD ICD 05/04/2021 with Dr. Lovena Le.  CXR pending Interrogation OK; Underlying V rate 45    3. PAF  Remains out of rhythm, rates are variable On amiodarone.  [place] will need to be held briefly for ICD implantation.  HF team wishes to get her back into rhythm to help support CO.  Would not resume Eliquis until TOMORROW, 05/06/2021 with the evening dose   For questions or updates, please contact [place] Please consult [email or web address] for contact info under Cardiology/STEMI.  Signed, Shirley Friar, PA-C  05/05/2021, 7:54 AM   EP Attending  Patient seen and examined. Agree with above. Ok to start back A[place] tomorrow evening. Watch for bleeding. ICD interrogation under my direction demonstrates normal device function and cxr demonstrates stable lead position with no PTX. Usual EP followup. As her HR is a little low now and we do not want to RV pace, I would decrease the dose of the amiodarone back to 200  bid.   Carleene Overlie Bostyn Bogie,MD

## 2021-05-06 DIAGNOSIS — I214 Non-ST elevation (NSTEMI) myocardial infarction: Secondary | ICD-10-CM | POA: Diagnosis not present

## 2021-05-06 LAB — GLUCOSE, CAPILLARY
Glucose-Capillary: 89 mg/dL (ref 70–99)
Glucose-Capillary: 92 mg/dL (ref 70–99)
Glucose-Capillary: 134 mg/dL — ABNORMAL HIGH (ref 70–99)
Glucose-Capillary: 140 mg/dL — ABNORMAL HIGH (ref 70–99)

## 2021-05-06 LAB — BASIC METABOLIC PANEL
Anion gap: 11 (ref 5–15)
BUN: 29 mg/dL — ABNORMAL HIGH (ref 8–23)
CO2: 20 mmol/L — ABNORMAL LOW (ref 22–32)
Calcium: 8.5 mg/dL — ABNORMAL LOW (ref 8.9–10.3)
Chloride: 103 mmol/L (ref 98–111)
Creatinine, Ser: 1.6 mg/dL — ABNORMAL HIGH (ref 0.44–1.00)
GFR, Estimated: 33 mL/min — ABNORMAL LOW (ref >60)
Glucose, Bld: 122 mg/dL — ABNORMAL HIGH (ref 70–99)
Potassium: 4.2 mmol/L (ref 3.5–5.1)
Sodium: 134 mmol/L — ABNORMAL LOW (ref 135–145)

## 2021-05-06 MED ORDER — CALCIUM CITRATE 950 (200 CA) MG PO TABS
200.0000 mg | ORAL_TABLET | Freq: Two times a day (BID) | ORAL | Status: DC
  Administered 2021-05-06 – 2021-05-11 (×10): 200 mg via ORAL
  Filled 2021-05-06 (×11): qty 1

## 2021-05-06 MED ORDER — LACTULOSE 10 GM/15ML PO SOLN
20.0000 g | Freq: Once | ORAL | Status: AC
  Administered 2021-05-06: 20 g via ORAL
  Filled 2021-05-06: qty 30

## 2021-05-06 MED ORDER — APIXABAN 5 MG PO TABS
5.0000 mg | ORAL_TABLET | Freq: Two times a day (BID) | ORAL | Status: DC
  Administered 2021-05-06 – 2021-05-11 (×10): 5 mg via ORAL
  Filled 2021-05-06 (×10): qty 1

## 2021-05-06 NOTE — Progress Notes (Addendum)
Patient ID: Sheri Morgan, adult   DOB: 1945/03/27, 76 y.o.   MRN: [id number]    Advanced Heart Failure Rounding Note   Subjective:    02/14: DBA stopped 02/16: Underwent DCCV AFL>>NSR 02/18: Recurrent AFL, restarted po amio 02/20: S/p dual chamber [employer] Jude ICD placement 2/21 Mexiletine stopped and Amiodarone amio drip started.   Having a little dizziness but improved today. Denies SOB.   Objective:     Vital Signs:   Temp:  [97.4 F (36.3 C)-98.2 F (36.8 C)] 97.7 F (36.5 C) (02/22 1132) Pulse Rate:  [52-90] 52 (02/22 1132) Resp:  [16] 16 (02/21 1548) BP: (73-127)/(53-73) 127/53 (02/22 1132) SpO2:  [97 %-98 %] 98 % (02/22 1132) Weight:  [89.3 kg] 89.3 kg (02/22 0510) Last BM Date : 05/05/21  Weight change: Filed Weights   05/04/21 0535 05/05/21 0450 05/06/21 0510  Weight: 88.6 kg 89.1 kg 89.3 kg    Intake/Output:   Intake/Output Summary (Last 24 hours) at 05/06/2021 1214 Last data filed at 05/06/2021 0600 Gross per 24 hour  Intake 1326.79 ml  Output --  Net 1326.79 ml    PHYSICAL EXAM: General:  Sitting in the chair. . No resp difficulty HEENT: normal Neck: supple. no JVD. Carotids 2+ bilat; no bruits. No lymphadenopathy or thryomegaly appreciated. Cor: PMI nondisplaced. Irregular rate & rhythm. No rubs, gallops or murmurs. L upper chest dressing.  Lungs: clear Abdomen: soft, nontender, nondistended. No hepatosplenomegaly. No bruits or masses. Good bowel sounds. Extremities: no cyanosis, clubbing, rash, edema Neuro: alert & orientedx3, cranial nerves grossly intact. moves all 4 extremities w/o difficulty. Affect pleasant   Telemetry: A flutter 60-80s     Labs: Basic Metabolic Panel: Recent Labs  Lab 04/30/21 0524 05/01/21 0525 05/02/21 0451 05/03/21 0324 05/04/21 0315 05/05/21 0459 05/06/21 0316  NA 138   < > 138 137 136 136 134*  K 4.1   < > 4.1 4.3 4.2 4.7 4.2  CL 102   < > 103 103 104 106 103  CO2 28   < > 28 27 24  19* 20*  GLUCOSE 94    < > 99 106* 110* 90 122*  BUN 30*   < > 25* 21 25* 26* 29*  CREATININE 1.36*   < > 1.34* 1.30* 1.44* 1.47* 1.60*  CALCIUM 8.6*   < > 8.5* 8.5* 8.6* 8.4* 8.5*  MG 2.7*  --   --   --  2.4  --   --    < > = values in this interval not displayed.    Liver Function Tests: Recent Labs  Lab 05/01/21 0525  AST 17  ALT 17  ALKPHOS 71  BILITOT 0.4  PROT 5.7*  ALBUMIN 2.8*   No results for input(s): LIPASE, AMYLASE in the last 168 hours. No results for input(s): AMMONIA in the last 168 hours.  CBC: Recent Labs  Lab 05/01/21 0525 05/02/21 0451 05/03/21 0324 05/04/21 0315 05/05/21 0459  WBC 5.2 6.8 7.3 9.1 10.1  HGB 11.9* 12.1 13.3 13.4 13.7  HCT 35.5* 36.2 40.3 40.7 40.4  MCV 90.6 90.0 91.2 90.6 90.0  PLT 128* 121* 144* 142* 133*    Cardiac Enzymes: No results for input(s): CKTOTAL, CKMB, CKMBINDEX, TROPONINI in the last 168 hours.  BNP: BNP (last 3 results) Recent Labs    04/23/21 2144  BNP 379.1*    ProBNP (last 3 results) No results for input(s): PROBNP in the last 8760 hours.    Other results:  Imaging: DG Chest  2 View  Result Date: 05/05/2021 CLINICAL DATA:  Defibrillator implantation EXAM: CHEST - 2 VIEW COMPARISON:  04/25/2021 FINDINGS: No focal consolidation. No pleural effusion or pneumothorax. Heart and mediastinal contours are unremarkable. Interval placement of a dual lead cardiac pacemaker in satisfactory position. No acute osseous abnormality. IMPRESSION: No active cardiopulmonary disease. Electronically Signed   By: Elige Ko M.D.   On: 05/05/2021 10:24   EP PPM/ICD IMPLANT  Result Date: 05/04/2021 CONCLUSIONS:  1. Non-Ischemic cardiomyopathy with chronic New [place] Heart Association class III heart failure.  2. Successful ICD implantation.  3. No early apparent complications. ICD Criteria Current LVEF:15%. Within 12 months prior to implant: Yes Heart failure history: Yes, Class III Cardiomyopathy history: Yes, Non-Ischemic Cardiomyopathy. Atrial  Fibrillation/Atrial Flutter: Yes, Persistent (> 7 Days). Ventricular tachycardia history: No. Cardiac arrest history: No. History of syndromes with risk of sudden death: No. Previous ICD: No. Current ICD indication: Primary PPM indication: No.  Class I or II Bradycardia indication present: Yes Beta Blocker therapy for 3 or more months: Yes, prescribed. Ace Inhibitor/ARB therapy for 3 or more months: Yes, prescribed. Lewayne Bunting, MD     Medications:     Scheduled Medications:  apixaban  5 mg Oral BID   calcium citrate  200 mg of elemental calcium Oral Daily   Chlorhexidine Gluconate Cloth  6 each Topical Daily   cholecalciferol  1,000 Units Oral Daily   dapagliflozin propanediol  10 mg Oral Daily   folic acid  1 mg Oral Daily   insulin aspart  0-9 Units Subcutaneous TID WC   levothyroxine  200 mcg Oral QAC breakfast   melatonin  3 mg Oral QHS   methotrexate  10 mg Oral Weekly   mupirocin ointment  1 application Nasal BID   pantoprazole  40 mg Oral Daily   polyethylene glycol  17 g Oral Daily   predniSONE  30 mg Oral Q breakfast   rosuvastatin  40 mg Oral Daily   sodium chloride flush  10-40 mL Intracatheter Q12H   sulfamethoxazole-trimethoprim  1 tablet Oral Q M,W,F    Infusions:  amiodarone 30 mg/hr (05/06/21 0313)      PRN Medications: acetaminophen, naphazoline-glycerin, nitroGLYCERIN, ondansetron (ZOFRAN) IV, senna-docusate, sodium chloride flush   Assessment/Plan:   1.  Acute on chronic systolic HF due to NICM -> cardiogenic shock. Suspect cardiac sarcoid based on cardiac MRI.  - 5/22 ECHO EF 30-35% - Cath 2/23 minimal CAD. hstroponin 204 -> 379 - Echo 2/23: EF 20-25% - cMRI EF 11% RVEF 15% LGE pattern concerning for sarcoid. (D/w Dr. Bjorn Pippin)  - Off DBA. S/P ICDm  - Volume status stable. She does not need diuretics.  - continue spironolactone 25 mg daily  - Continue Farxiga 10 mg daily  -Now on the below for  sarcoid   Immunosuppressive Therapy for Cardiac  Sarcoid  Prednisone (04/29/21) Weeks 1-4: 30 mg daily  Methotrexate  (04/30/21) Weeks 1&2: 10 mg (four 2.5 mg tablets) once weekly Additional Medications  Bactrim DS : 1 tablet Mon-Wed- Fri while taking >15 mg of prednisone daily  Folic Acid : 1 mg daily  Calcium Citrate -- switch to twice a day   Vitamin D: 1000 IU total daily  On protonix 40 mg daily while taking any dose of prednisone.      2. PVCs/NSVT/PMVT - EP following.  - did not tolerate amiodarone previously due to worsening bradycardia - off lido due to n/v that said PVC burden much decreased with lido - mexiletine  discontinued 2/21 per EP - Continue amio drip.  - S/p ICD 02/23 - on steroids. Plan as above - ACE level normal (52 U/L)  - Keep K> 4.0 Mg > 2.0    3. Paroxysmal Atrial Flutter - S/p DCCV 2/16 - Now back in AFL on 2/18.  - Restart eliquis this evening per EP.  -  Continue amio drip.   -Plan for TEE/DCCV on Friday, 02/24  4. Hypokalemia - resolved. K 4.2   Amy Clegg NP-C  05/06/2021 12:14 PM   Patient seen and examined with the above-signed Advanced Practice Provider and/or Housestaff. I personally reviewed laboratory data, imaging studies and relevant notes. I independently examined the patient and formulated the important aspects of the plan. I have edited the note to reflect any of my changes or salient points. I have personally discussed the plan with the patient and/or family.  Mild dizziness. Denies CP or SOB. Remains in AFL. On Amio gtt. Off AC due to ICD.   General:  Sitting up in bed No resp difficulty HEENT: normal Neck: supple. no JVD. Carotids 2+ bilat; no bruits. No lymphadenopathy or thryomegaly appreciated. Cor: PMI nondisplaced. Irregular rate & rhythm. No rubs, gallops or murmurs. ICD site ok Lungs: clear Abdomen: soft, nontender, nondistended. No hepatosplenomegaly. No bruits or masses. Good bowel sounds. Extremities: no cyanosis, clubbing, rash, edema Neuro: alert & orientedx3,  cranial nerves grossly intact. moves all 4 extremities w/o difficulty. Affect pleasant  Stable from HF standpoint. Remains in AFL on IV amio.   Restart Eliquis tonight. Plan TEE/DC-CV on Friday. Continue treatment for sarcoid.  Glori Bickers, MD  6:13 PM

## 2021-05-06 NOTE — Progress Notes (Signed)
NAEO.   EP to follow at a distance.   Note plan for Eliquis resumption tonight and TEE/DCC at end of week.  Will follow device pocket for hematoma back on Eliquis.    Casimiro Needle [address] [place], PA-C  05/06/2021 7:05 AM

## 2021-05-06 NOTE — Plan of Care (Signed)

## 2021-05-06 NOTE — Progress Notes (Signed)
°   05/06/21 0510  Orthostatic Lying   BP- Lying 107/60  Orthostatic Sitting  BP- Sitting 127/59  Pulse- Sitting 88  Orthostatic Standing at 0 minutes  BP- Standing at 0 minutes 102/73  Orthostatic Standing at 3 minutes  BP- Standing at 3 minutes (!) 118/96  Pulse- Standing at 3 minutes 92

## 2021-05-07 DIAGNOSIS — R570 Cardiogenic shock: Secondary | ICD-10-CM | POA: Diagnosis not present

## 2021-05-07 DIAGNOSIS — I5023 Acute on chronic systolic (congestive) heart failure: Secondary | ICD-10-CM | POA: Diagnosis not present

## 2021-05-07 LAB — BASIC METABOLIC PANEL
Anion gap: 8 (ref 5–15)
BUN: 26 mg/dL — ABNORMAL HIGH (ref 8–23)
CO2: 23 mmol/L (ref 22–32)
Calcium: 8.7 mg/dL — ABNORMAL LOW (ref 8.9–10.3)
Chloride: 103 mmol/L (ref 98–111)
Creatinine, Ser: 1.74 mg/dL — ABNORMAL HIGH (ref 0.44–1.00)
GFR, Estimated: 30 mL/min — ABNORMAL LOW (ref >60)
Glucose, Bld: 111 mg/dL — ABNORMAL HIGH (ref 70–99)
Potassium: 4.6 mmol/L (ref 3.5–5.1)
Sodium: 134 mmol/L — ABNORMAL LOW (ref 135–145)

## 2021-05-07 LAB — GLUCOSE, CAPILLARY
Glucose-Capillary: 92 mg/dL (ref 70–99)
Glucose-Capillary: 114 mg/dL — ABNORMAL HIGH (ref 70–99)
Glucose-Capillary: 159 mg/dL — ABNORMAL HIGH (ref 70–99)
Glucose-Capillary: 121 mg/dL — ABNORMAL HIGH (ref 70–99)

## 2021-05-07 LAB — MAGNESIUM: Magnesium: 2.3 mg/dL (ref 1.7–2.4)

## 2021-05-07 MED ORDER — SULFAMETHOXAZOLE-TRIMETHOPRIM 400-80 MG PO TABS
1.0000 | ORAL_TABLET | ORAL | Status: DC
  Administered 2021-05-08: 1 via ORAL
  Filled 2021-05-07: qty 1

## 2021-05-07 MED ORDER — SORBITOL 70 % SOLN
30.0000 mL | Freq: Once | Status: DC
  Filled 2021-05-07: qty 30

## 2021-05-07 MED ORDER — SENNA 8.6 MG PO TABS
1.0000 | ORAL_TABLET | Freq: Every day | ORAL | Status: DC
  Administered 2021-05-07 – 2021-05-11 (×5): 8.6 mg via ORAL
  Filled 2021-05-07 (×5): qty 1

## 2021-05-07 NOTE — H&P (View-Only) (Signed)
Patient ID: JODIE-LEE EHMKE, adult   DOB: 01/07/1946, 76 y.o.   MRN: [id number]    Advanced Heart Failure Rounding Note   Subjective:    02/14: DBA stopped 02/16: Underwent DCCV AFL>>NSR 02/18: Recurrent AFL, restarted po amio 02/20: S/p dual chamber [employer] Jude ICD placement 2/21 Mexiletine stopped and Amiodarone amio drip started.   Eliquis restarted last night. Device pocket stable.   Remains in AFL, controlled rate. On amio gtt.   Scr up, 1.47>>1.60>>1.74. No loop dieretics. Remains off Entresto and [place]. BP has been stable.    Wt stable. Denies dyspnea. Less dizzy today.   Only complaint is constipation. Last BM 3 days ago.    Objective:     Vital Signs:   Temp:  [97.7 F (36.5 C)-98.2 F (36.8 C)] 98.2 F (36.8 C) (02/23 0525) Pulse Rate:  [52-86] 60 (02/23 0525) Resp:  [15-16] 16 (02/23 0525) BP: (122-127)/(53-80) 122/80 (02/22 2039) SpO2:  [97 %-98 %] 98 % (02/23 0525) Weight:  [89.1 kg] 89.1 kg (02/23 0525) Last BM Date : 05/05/21  Weight change: Filed Weights   05/05/21 0450 05/06/21 0510 05/07/21 0525  Weight: 89.1 kg 89.3 kg 89.1 kg    Intake/Output:  No intake or output data in the 24 hours ending 05/07/21 0714   PHYSICAL EXAM: General:  Well appearing. No respiratory difficulty HEENT: normal Neck: supple. no JVD. Carotids 2+ bilat; no bruits. No lymphadenopathy or thyromegaly appreciated. Cor: PMI nondisplaced. irregular rhythm and rate. No rubs, gallops or murmurs. Device pocket left upper chest bandaged. Clean dressing. No drainage.  Lungs: clear Abdomen: soft, nontender, nondistended. No hepatosplenomegaly. No bruits or masses. Good bowel sounds. Extremities: no cyanosis, clubbing, rash, edema Neuro: alert & oriented x 3, cranial nerves grossly intact. moves all 4 extremities w/o difficulty. Affect pleasant.   Telemetry: A flutter 60-80s,  PVCs <10/min   Labs: Basic Metabolic Panel: Recent Labs  Lab 05/03/21 0324 05/04/21 0315  05/05/21 0459 05/06/21 0316 05/07/21 0357  NA 137 136 136 134* 134*  K 4.3 4.2 4.7 4.2 4.6  CL 103 104 106 103 103  CO2 27 24 19* 20* 23  GLUCOSE 106* 110* 90 122* 111*  BUN 21 25* 26* 29* 26*  CREATININE 1.30* 1.44* 1.47* 1.60* 1.74*  CALCIUM 8.5* 8.6* 8.4* 8.5* 8.7*  MG  --  2.4  --   --  2.3    Liver Function Tests: Recent Labs  Lab 05/01/21 0525  AST 17  ALT 17  ALKPHOS 71  BILITOT 0.4  PROT 5.7*  ALBUMIN 2.8*   No results for input(s): LIPASE, AMYLASE in the last 168 hours. No results for input(s): AMMONIA in the last 168 hours.  CBC: Recent Labs  Lab 05/01/21 0525 05/02/21 0451 05/03/21 0324 05/04/21 0315 05/05/21 0459  WBC 5.2 6.8 7.3 9.1 10.1  HGB 11.9* 12.1 13.3 13.4 13.7  HCT 35.5* 36.2 40.3 40.7 40.4  MCV 90.6 90.0 91.2 90.6 90.0  PLT 128* 121* 144* 142* 133*    Cardiac Enzymes: No results for input(s): CKTOTAL, CKMB, CKMBINDEX, TROPONINI in the last 168 hours.  BNP: BNP (last 3 results) Recent Labs    04/23/21 2144  BNP 379.1*    ProBNP (last 3 results) No results for input(s): PROBNP in the last 8760 hours.    Other results:  Imaging: DG Chest 2 View  Result Date: 05/05/2021 CLINICAL DATA:  Defibrillator implantation EXAM: CHEST - 2 VIEW COMPARISON:  04/25/2021 FINDINGS: No focal consolidation. No pleural effusion or  pneumothorax. Heart and mediastinal contours are unremarkable. Interval placement of a dual lead cardiac pacemaker in satisfactory position. No acute osseous abnormality. IMPRESSION: No active cardiopulmonary disease. Electronically Signed   By: Kathreen Devoid M.D.   On: 05/05/2021 10:24     Medications:     Scheduled Medications:  apixaban  5 mg Oral BID   calcium citrate  200 mg of elemental calcium Oral BID   Chlorhexidine Gluconate Cloth  6 each Topical Daily   cholecalciferol  1,000 Units Oral Daily   dapagliflozin propanediol  10 mg Oral Daily   folic acid  1 mg Oral Daily   insulin aspart  0-9 Units  Subcutaneous TID WC   levothyroxine  200 mcg Oral QAC breakfast   melatonin  3 mg Oral QHS   methotrexate  10 mg Oral Weekly   mupirocin ointment  1 application Nasal BID   pantoprazole  40 mg Oral Daily   polyethylene glycol  17 g Oral Daily   predniSONE  30 mg Oral Q breakfast   rosuvastatin  40 mg Oral Daily   sodium chloride flush  10-40 mL Intracatheter Q12H   sulfamethoxazole-trimethoprim  1 tablet Oral Q M,W,F    Infusions:  amiodarone 30 mg/hr (05/07/21 0313)      PRN Medications: acetaminophen, naphazoline-glycerin, nitroGLYCERIN, ondansetron (ZOFRAN) IV, senna-docusate, sodium chloride flush   Assessment/Plan:   1.  Acute on chronic systolic HF due to NICM -> cardiogenic shock. Suspect cardiac sarcoid based on cardiac MRI.  - 5/22 ECHO EF 30-35% - Cath 2/23 minimal CAD. hstroponin 204 -> 379 - Echo 2/23: EF 20-25% - cMRI EF 11% RVEF 15% LGE pattern concerning for sarcoid. (D/w Dr. Gardiner Rhyme)  - Off DBA. S/P ICDm  - Volume status stable. She does not need diuretics.  Delene Loll and Arlyce Harman on hold w/ soft BP and AKI   - Continue Farxiga 10 mg daily  -Now on the below for  sarcoid  Immunosuppressive Therapy for Cardiac Sarcoid  Prednisone (04/29/21) Weeks 1-4: 30 mg daily  Methotrexate  (04/30/21) Weeks 1&2: 10 mg (four 2.5 mg tablets) once weekly Additional Medications  Bactrim DS : 1 tablet Mon-Wed- Fri while taking >15 mg of prednisone daily  Folic Acid : 1 mg daily  Calcium Citrate -- switch to twice a day   Vitamin D: 1000 IU total daily  On protonix 40 mg daily while taking any dose of prednisone.      2. PVCs/NSVT/PMVT - EP following.  - did not tolerate amiodarone previously due to worsening bradycardia - off lido due to n/v that said PVC burden much decreased with lido - mexiletine discontinued 2/21 per EP - Continue amio drip.  - S/p ICD 02/23 - on steroids. Plan as above - ACE level normal (52 U/L)  - Keep K> 4.0 Mg > 2.0    3. Paroxysmal Atrial  Flutter - S/p DCCV 2/16 - Now back in AFL on 2/18.  - Continue Eliquis  - Continue amio drip.   - Plan for TEE/DCCV on Friday, 02/24  4. Hypokalemia - resolved. K 4.6   5. AKI - SCr 1.47>>1.60>>1.74 - BP ok  - continue to hold Entresto and Arlyce Harman  - encouraged PO intake   6. Constipation  - advance bowel regimen, will d/w pharmacy - sorbitol if needed   Nelida Gores  05/07/2021 7:14 AM  Patient seen and examined with the above-signed Advanced Practice Provider and/or Housestaff. I personally reviewed laboratory data, imaging studies and relevant  notes. I independently examined the patient and formulated the important aspects of the plan. I have edited the note to reflect any of my changes or salient points. I have personally discussed the plan with the patient and/or family.  Stable. Denies CP or SOB. Now back on eliquis. ICD site ok. SCr up slightly today. SBP 110-125. Remains in AF/AFL. On amio gtt.   General:  Lying in bed No resp difficulty HEENT: normal Neck: supple. no JVD. Carotids 2+ bilat; no bruits. No lymphadenopathy or thryomegaly appreciated. Cor: PMI nondisplaced. irregular rate & rhythm. No rubs, gallops or murmurs. ICD dressing ok  Lungs: clear Abdomen: soft, nontender, nondistended. No hepatosplenomegaly. No bruits or masses. Good bowel sounds. Extremities: no cyanosis, clubbing, rash, edema Neuro: alert & orientedx3, cranial nerves grossly intact. moves all 4 extremities w/o difficulty. Affect pleasant  Stable for TEE/DC-CV in am. If Scr stable would add back losartan.   Glori Bickers, MD  4:27 PM

## 2021-05-07 NOTE — Plan of Care (Signed)
  Problem: Activity: Goal: Risk for activity intolerance will decrease Outcome: Progressing   Problem: Coping: Goal: Level of anxiety will decrease Outcome: Progressing   Problem: Pain Managment: Goal: General experience of comfort will improve Outcome: Progressing   Problem: Safety: Goal: Ability to remain free from injury will improve Outcome: Progressing   Problem: Skin Integrity: Goal: Risk for impaired skin integrity will decrease Outcome: Progressing   

## 2021-05-07 NOTE — Progress Notes (Signed)
CARDIAC REHAB PHASE I   Went to offer to walk with pt. Pt eating lunch at this time. States she is feeling good. Hopes to d/c over the weekend. DME at bedside. Will f/u for ambulation.  Rufina Falco, RN BSN 05/07/2021 1:35 PM

## 2021-05-07 NOTE — Progress Notes (Addendum)
Patient ID: Sheri Morgan, adult   DOB: 12/15/1945, 76 y.o.   MRN: [id number]    Advanced Heart Failure Rounding Note   Subjective:    02/14: DBA stopped 02/16: Underwent DCCV AFL>>NSR 02/18: Recurrent AFL, restarted po amio 02/20: S/p dual chamber [employer] Jude ICD placement 2/21 Mexiletine stopped and Amiodarone amio drip started.   Eliquis restarted last night. Device pocket stable.   Remains in AFL, controlled rate. On amio gtt.   Scr up, 1.47>>1.60>>1.74. No loop dieretics. Remains off Entrest[place] and [place]. BP has been stable.    Wt stable. Denies dyspnea. Less dizzy today.   Only complaint is constipation. Last BM 3 days ago.    Objective:     Vital Signs:   Temp:  [97.7 F (36.5 C)-98.2 F (36.8 C)] 98.2 F (36.8 C) (02/23 0525) Pulse Rate:  [52-86] 60 (02/23 0525) Resp:  [15-16] 16 (02/23 0525) BP: (122-127)/(53-80) 122/80 (02/22 2039) SpO2:  [97 %-98 %] 98 % (02/23 0525) Weight:  [89.1 kg] 89.1 kg (02/23 0525) Last BM Date : 05/05/21  Weight change: Filed Weights   05/05/21 0450 05/06/21 0510 05/07/21 0525  Weight: 89.1 kg 89.3 kg 89.1 kg    Intake/Output:  No intake or output data in the 24 hours ending 05/07/21 0714   PHYSICAL EXAM: General:  Well appearing. No respiratory difficulty HEENT: normal Neck: supple. no JVD. Carotids 2+ bilat; no bruits. No lymphadenopathy or thyromegaly appreciated. Cor: PMI nondisplaced. irregular rhythm and rate. No rubs, gallops or murmurs. Device pocket left upper chest bandaged. Clean dressing. No drainage.  Lungs: clear Abdomen: soft, nontender, nondistended. No hepatosplenomegaly. No bruits or masses. Good bowel sounds. Extremities: no cyanosis, clubbing, rash, edema Neuro: alert & oriented x 3, cranial nerves grossly intact. moves all 4 extremities w/o difficulty. Affect pleasant.   Telemetry: A flutter 60-80s,  PVCs <10/min   Labs: Basic Metabolic Panel: Recent Labs  Lab 05/03/21 0324 05/04/21 0315  05/05/21 0459 05/06/21 0316 05/07/21 0357  NA 137 136 136 134* 134*  K 4.3 4.2 4.7 4.2 4.6  CL 103 104 106 103 103  CO2 27 24 19* 20* 23  GLUCOSE 106* 110* 90 122* 111*  BUN 21 25* 26* 29* 26*  CREATININE 1.30* 1.44* 1.47* 1.60* 1.74*  CALCIUM 8.5* 8.6* 8.4* 8.5* 8.7*  MG  --  2.4  --   --  2.3    Liver Function Tests: Recent Labs  Lab 05/01/21 0525  AST 17  ALT 17  ALKPHOS 71  BILITOT 0.4  PROT 5.7*  ALBUMIN 2.8*   No results for input(s): LIPASE, AMYLASE in the last 168 hours. No results for input(s): AMMONIA in the last 168 hours.  CBC: Recent Labs  Lab 05/01/21 0525 05/02/21 0451 05/03/21 0324 05/04/21 0315 05/05/21 0459  WBC 5.2 6.8 7.3 9.1 10.1  HGB 11.9* 12.1 13.3 13.4 13.7  HCT 35.5* 36.2 40.3 40.7 40.4  MCV 90.6 90.0 91.2 90.6 90.0  PLT 128* 121* 144* 142* 133*    Cardiac Enzymes: No results for input(s): CKTOTAL, CKMB, CKMBINDEX, TROPONINI in the last 168 hours.  BNP: BNP (last 3 results) Recent Labs    04/23/21 2144  BNP 379.1*    ProBNP (last 3 results) No results for input(s): PROBNP in the last 8760 hours.    Other results:  Imaging: DG Chest 2 View  Result Date: 05/05/2021 CLINICAL DATA:  Defibrillator implantation EXAM: CHEST - 2 VIEW COMPARISON:  04/25/2021 FINDINGS: No focal consolidation. No pleural effusion or  pneumothorax. Heart and mediastinal contours are unremarkable. Interval placement of a dual lead cardiac pacemaker in satisfactory position. No acute osseous abnormality. IMPRESSION: No active cardiopulmonary disease. Electronically Signed   By: Kathreen Devoid M.D.   On: 05/05/2021 10:24     Medications:     Scheduled Medications:  apixaban  5 mg Oral BID   calcium citrate  200 mg of elemental calcium Oral BID   Chlorhexidine Gluconate Cloth  6 each Topical Daily   cholecalciferol  1,000 Units Oral Daily   dapagliflozin propanediol  10 mg Oral Daily   folic acid  1 mg Oral Daily   insulin aspart  0-9 Units  Subcutaneous TID WC   levothyroxine  200 mcg Oral QAC breakfast   melatonin  3 mg Oral QHS   methotrexate  10 mg Oral Weekly   mupirocin ointment  1 application Nasal BID   pantoprazole  40 mg Oral Daily   polyethylene glycol  17 g Oral Daily   predniSONE  30 mg Oral Q breakfast   rosuvastatin  40 mg Oral Daily   sodium chloride flush  10-40 mL Intracatheter Q12H   sulfamethoxazole-trimethoprim  1 tablet Oral Q M,W,F    Infusions:  amiodarone 30 mg/hr (05/07/21 0313)      PRN Medications: acetaminophen, naphazoline-glycerin, nitroGLYCERIN, ondansetron (ZOFRAN) IV, senna-docusate, sodium chloride flush   Assessment/Plan:   1.  Acute on chronic systolic HF due to NICM -> cardiogenic shock. Suspect cardiac sarcoid based on cardiac MRI.  - 5/22 ECHO EF 30-35% - Cath 2/23 minimal CAD. hstroponin 204 -> 379 - Echo 2/23: EF 20-25% - cMRI EF 11% RVEF 15% LGE pattern concerning for sarcoid. (D/w Dr. Gardiner Rhyme)  - Off DBA. S/P ICDm  - Volume status stable. She does not need diuretics.  Delene Loll and Arlyce Harman on hold w/ soft BP and AKI   - Continue Farxiga 10 mg daily  -Now on the below for  sarcoid  Immunosuppressive Therapy for Cardiac Sarcoid  Prednisone (04/29/21) Weeks 1-4: 30 mg daily  Methotrexate  (04/30/21) Weeks 1&2: 10 mg (four 2.5 mg tablets) once weekly Additional Medications  Bactrim DS : 1 tablet Mon-Wed- Fri while taking >15 mg of prednisone daily  Folic Acid : 1 mg daily  Calcium Citrate -- switch to twice a day   Vitamin D: 1000 IU total daily  On protonix 40 mg daily while taking any dose of prednisone.      2. PVCs/NSVT/PMVT - EP following.  - did not tolerate amiodarone previously due to worsening bradycardia - off lido due to n/v that said PVC burden much decreased with lido - mexiletine discontinued 2/21 per EP - Continue amio drip.  - S/p ICD 02/23 - on steroids. Plan as above - ACE level normal (52 U/L)  - Keep K> 4.0 Mg > 2.0    3. Paroxysmal Atrial  Flutter - S/p DCCV 2/16 - Now back in AFL on 2/18.  - Continue Eliquis  - Continue amio drip.   - Plan for TEE/DCCV on Friday, 02/24  4. Hypokalemia - resolved. K 4.6   5. AKI - SCr 1.47>>1.60>>1.74 - BP ok  - continue to hold Entresto and Arlyce Harman  - encouraged PO intake   6. Constipation  - advance bowel regimen, will d/w pharmacy - sorbitol if needed   Nelida Gores  05/07/2021 7:14 AM  Patient seen and examined with the above-signed Advanced Practice Provider and/or Housestaff. I personally reviewed laboratory data, imaging studies and relevant  notes. I independently examined the patient and formulated the important aspects of the plan. I have edited the note to reflect any of my changes or salient points. I have personally discussed the plan with the patient and/or family.  Stable. Denies CP or SOB. Now back on eliquis. ICD site ok. SCr up slightly today. SBP 110-125. Remains in AF/AFL. On amio gtt.   General:  Lying in bed No resp difficulty HEENT: normal Neck: supple. no JVD. Carotids 2+ bilat; no bruits. No lymphadenopathy or thryomegaly appreciated. Cor: PMI nondisplaced. irregular rate & rhythm. No rubs, gallops or murmurs. ICD dressing ok  Lungs: clear Abdomen: soft, nontender, nondistended. No hepatosplenomegaly. No bruits or masses. Good bowel sounds. Extremities: no cyanosis, clubbing, rash, edema Neuro: alert & orientedx3, cranial nerves grossly intact. moves all 4 extremities w/o difficulty. Affect pleasant  Stable for TEE/DC-CV in am. If Scr stable would add back losartan.   Glori Bickers, MD  4:27 PM

## 2021-05-07 NOTE — Progress Notes (Signed)
CARDIAC REHAB PHASE I   Returned to walk with pt. Pt in BR, laxative working finally. Belly feels better. Will continue to follow.  Reynold Bowen, RN BSN 05/07/2021 2:44 PM

## 2021-05-08 ENCOUNTER — Inpatient Hospital Stay (HOSPITAL_COMMUNITY): Payer: Medicare Other | Admitting: Anesthesiology

## 2021-05-08 ENCOUNTER — Encounter (HOSPITAL_COMMUNITY): Payer: Self-pay | Admitting: Internal Medicine

## 2021-05-08 ENCOUNTER — Encounter (HOSPITAL_COMMUNITY)
Admission: EM | Disposition: A | Discharge status: Home / Self Care | Payer: Self-pay | Source: Home / Self Care | Attending: Internal Medicine

## 2021-05-08 ENCOUNTER — Inpatient Hospital Stay (HOSPITAL_COMMUNITY): Payer: Medicare Other

## 2021-05-08 DIAGNOSIS — I340 Nonrheumatic mitral (valve) insufficiency: Secondary | ICD-10-CM | POA: Diagnosis not present

## 2021-05-08 DIAGNOSIS — I130 Hypertensive heart and chronic kidney disease with heart failure and stage 1 through stage 4 chronic kidney disease, or unspecified chronic kidney disease: Secondary | ICD-10-CM

## 2021-05-08 DIAGNOSIS — I4892 Unspecified atrial flutter: Secondary | ICD-10-CM | POA: Diagnosis not present

## 2021-05-08 DIAGNOSIS — I509 Heart failure, unspecified: Secondary | ICD-10-CM

## 2021-05-08 DIAGNOSIS — I088 Other rheumatic multiple valve diseases: Secondary | ICD-10-CM

## 2021-05-08 DIAGNOSIS — N189 Chronic kidney disease, unspecified: Secondary | ICD-10-CM

## 2021-05-08 DIAGNOSIS — I5023 Acute on chronic systolic (congestive) heart failure: Secondary | ICD-10-CM | POA: Diagnosis not present

## 2021-05-08 DIAGNOSIS — R570 Cardiogenic shock: Secondary | ICD-10-CM | POA: Diagnosis not present

## 2021-05-08 HISTORY — PX: CARDIOVERSION: SHX1299

## 2021-05-08 HISTORY — PX: TEE WITHOUT CARDIOVERSION: SHX5443

## 2021-05-08 LAB — BASIC METABOLIC PANEL
Anion gap: 10 (ref 5–15)
Anion gap: 10 (ref 5–15)
BUN: 23 mg/dL (ref 8–23)
BUN: 20 mg/dL (ref 8–23)
CO2: 24 mmol/L (ref 22–32)
CO2: 25 mmol/L (ref 22–32)
Calcium: 9.1 mg/dL (ref 8.9–10.3)
Calcium: 9 mg/dL (ref 8.9–10.3)
Chloride: 105 mmol/L (ref 98–111)
Chloride: 104 mmol/L (ref 98–111)
Creatinine, Ser: 1.48 mg/dL — ABNORMAL HIGH (ref 0.44–1.00)
Creatinine, Ser: 1.48 mg/dL — ABNORMAL HIGH (ref 0.44–1.00)
GFR, Estimated: 36 mL/min — ABNORMAL LOW (ref >60)
GFR, Estimated: 36 mL/min — ABNORMAL LOW (ref >60)
Glucose, Bld: 89 mg/dL (ref 70–99)
Glucose, Bld: 112 mg/dL — ABNORMAL HIGH (ref 70–99)
Potassium: 5.9 mmol/L — ABNORMAL HIGH (ref 3.5–5.1)
Potassium: 4.2 mmol/L (ref 3.5–5.1)
Sodium: 139 mmol/L (ref 135–145)
Sodium: 139 mmol/L (ref 135–145)

## 2021-05-08 LAB — CBC
HCT: 41.2 % (ref 36.0–46.0)
Hemoglobin: 13.8 g/dL (ref 12.0–15.0)
MCH: 30.5 pg (ref 26.0–34.0)
MCHC: 33.5 g/dL (ref 30.0–36.0)
MCV: 91.2 fL (ref 80.0–100.0)
Platelets: UNDETERMINED 10*3/uL (ref 150–400)
RBC: 4.52 MIL/uL (ref 3.87–5.11)
RDW: 17.1 % — ABNORMAL HIGH (ref 11.5–15.5)
WBC: 10.1 10*3/uL (ref 4.0–10.5)
nRBC: 0 % (ref 0.0–0.2)

## 2021-05-08 LAB — GLUCOSE, CAPILLARY
Glucose-Capillary: 69 mg/dL — ABNORMAL LOW (ref 70–99)
Glucose-Capillary: 101 mg/dL — ABNORMAL HIGH (ref 70–99)
Glucose-Capillary: 157 mg/dL — ABNORMAL HIGH (ref 70–99)
Glucose-Capillary: 115 mg/dL — ABNORMAL HIGH (ref 70–99)

## 2021-05-08 SURGERY — ECHOCARDIOGRAM, TRANSESOPHAGEAL
Anesthesia: General

## 2021-05-08 MED ORDER — SODIUM CHLORIDE 0.45 % IV SOLN: INTRAVENOUS | Status: DC

## 2021-05-08 MED ORDER — PROPOFOL 500 MG/50ML IV EMUL
INTRAVENOUS | Status: DC | PRN
  Administered 2021-05-08: 30 mg via INTRAVENOUS
  Administered 2021-05-08: 20 mg via INTRAVENOUS
  Administered 2021-05-08: 10 mg via INTRAVENOUS
  Administered 2021-05-08: 20 mg via INTRAVENOUS
  Administered 2021-05-08: 75 ug/kg/min via INTRAVENOUS

## 2021-05-08 MED ORDER — SODIUM CHLORIDE 0.9 % IV SOLN: INTRAVENOUS | Status: DC | PRN

## 2021-05-08 MED ORDER — METHOTREXATE 2.5 MG PO TABS: 12.5000 mg | ORAL_TABLET | ORAL | Status: DC

## 2021-05-08 MED ORDER — PHENYLEPHRINE 40 MCG/ML (10ML) SYRINGE FOR IV PUSH (FOR BLOOD PRESSURE SUPPORT)
PREFILLED_SYRINGE | INTRAVENOUS | Status: DC | PRN
  Administered 2021-05-08 (×3): 80 ug via INTRAVENOUS

## 2021-05-08 NOTE — Interval H&P Note (Signed)
History and Physical Interval Note:  05/08/2021 7:35 AM  Sheri Morgan  has presented today for surgery, with the diagnosis of AFLUTTER.  The various methods of treatment have been discussed with the patient and family. After consideration of risks, benefits and other options for treatment, the patient has consented to  Procedure(s): TRANSESOPHAGEAL ECHOCARDIOGRAM (TEE) (N/A) CARDIOVERSION (N/A) as a surgical intervention.  The patient's history has been reviewed, patient examined, no change in status, stable for surgery.  I have reviewed the patient's chart and labs.  Questions were answered to the patient's satisfaction.     Karilynn Carranza

## 2021-05-08 NOTE — Progress Notes (Signed)
Physical Therapy Treatment/ Discharge Patient Details Name: Sheri Morgan MRN: [id number] DOB: [birth year] Today's Date: 05/08/2021   History of Present Illness 76 y.o. female who presented 04/23/21 with SOB and chest pain with exertion and then sudden chest pain at rest with nausea and emesis on 2/9. 2/9 cardiac cath with VT,non-obstructive CAD. S/p cardioversion 2/16. Pt with cardiac sarcoid, progressive CHF with LV dysfunction. 2/20 DDD ICD placement. 2/24 TEE with DCCV. PMH: atypical atrial flutter, fibromyalgia, HTN, HFrEF, Afib    PT Comments    Pt very pleasant and reports a little anesthesia still hanging around but pt without dizziness or imbalance with mobility. Pt continues to require min cues to maintain ICD precautions with son present during education. Pt did not require assist with transfers or gait and does not require further acute therapy with pt aware and agreeable, will sign off.    Recommendations for follow up therapy are one component of a multi-disciplinary discharge planning process, led by the attending physician.  Recommendations may be updated based on patient status, additional functional criteria and insurance authorization.  Follow Up Recommendations  No PT follow up     Assistance Recommended at Discharge PRN  Patient can return home with the following Assistance with cooking/housework;Help with stairs or ramp for entrance   Equipment Recommendations  None recommended by PT    Recommendations for Other Services       Precautions / Restrictions Precautions Precautions: ICD/Pacemaker     Mobility  Bed Mobility Overal bed mobility: Needs Assistance Bed Mobility: Supine to Sit     Supine to sit: Supervision     General bed mobility comments: bed flat with cues for precautions with pt able to exit to right side without use of LUE    Transfers Overall transfer level: Modified independent                 General transfer comment: pt able to  stand from bed and toilet without assist    Ambulation/Gait Ambulation/Gait assistance: Modified independent (Device/Increase time) Gait Distance (Feet): 500 Feet Assistive device: None Gait Pattern/deviations: Step-through pattern, WFL(Within Functional Limits)   Gait velocity interpretation: >4.37 ft/sec, indicative of normal walking speed   General Gait Details: pt with steady gait, good speed and increased gait tolerance   Stairs             Wheelchair Mobility    Modified Rankin (Stroke Patients Only)       Balance Overall balance assessment: No apparent balance deficits (not formally assessed)                                          Cognition Arousal/Alertness: Awake/alert Behavior During Therapy: WFL for tasks assessed/performed Overall Cognitive Status: Within Functional Limits for tasks assessed                                 General Comments: difficulty maintaining precautions with functional tasks        Exercises      General Comments        Pertinent Vitals/Pain Pain Assessment Pain Assessment: No/denies pain    Home Living                          Prior Function  PT Goals (current goals can now be found in the care plan section) Progress towards PT goals: Progressing toward goals    Frequency           PT Plan Current plan remains appropriate    Co-evaluation              AM-PAC PT "6 Clicks" Mobility   Outcome Measure  Help needed turning from your back to your side while in a flat bed without using bedrails?: None Help needed moving from lying on your back to sitting on the side of a flat bed without using bedrails?: None Help needed moving to and from a bed to a chair (including a wheelchair)?: None Help needed standing up from a chair using your arms (e.g., wheelchair or bedside chair)?: None Help needed to walk in hospital room?: None Help needed climbing  3-5 steps with a railing? : None 6 Click Score: 24    End of Session   Activity Tolerance: Patient tolerated treatment well Patient left: in chair;with call bell/phone within reach;with family/visitor present Nurse Communication: Mobility status PT Visit Diagnosis: Other abnormalities of gait and mobility (R26.89)     Time: [id number] PT Time Calculation (min) (ACUTE ONLY): 24 min  Charges:  $Gait Training: 8-22 mins $Therapeutic Activity: 8-22 mins                     Yoona Ishii P, PT Acute Rehabilitation Services Pager: [phone] Office: [phone]    Sandy Salaam Breasia Karges 05/08/2021, 12:07 PM

## 2021-05-08 NOTE — Anesthesia Preprocedure Evaluation (Signed)
Anesthesia Evaluation  Patient identified by MRN, date of birth, ID band Patient awake    Reviewed: Allergy & Precautions, NPO status , Patient's Chart, lab work & pertinent test results  Airway Mallampati: II  TM Distance: >3 FB Neck ROM: Full    Dental no notable dental hx.    Pulmonary neg pulmonary ROS,    breath sounds clear to auscultation       Cardiovascular hypertension, + Past MI and +CHF   Rhythm:Irregular Rate:Normal  EF 20-25%   Neuro/Psych negative neurological ROS     GI/Hepatic negative GI ROS, Neg liver ROS,   Endo/Other  Hypothyroidism   Renal/GU CRFRenal disease     Musculoskeletal   Abdominal (+) + obese,   Peds  Hematology negative hematology ROS (+)   Anesthesia Other Findings   Reproductive/Obstetrics                             Anesthesia Physical  Anesthesia Plan  ASA: 3  Anesthesia Plan: General   Post-op Pain Management: Minimal or no pain anticipated   Induction: Intravenous  PONV Risk Score and Plan: 3 and Treatment may vary due to age or medical condition, Propofol infusion and TIVA  Airway Management Planned: Natural Airway and Mask  Additional Equipment: None  Intra-op Plan:   Post-operative Plan:   Informed Consent: I have reviewed the patients History and Physical, chart, labs and discussed the procedure including the risks, benefits and alternatives for the proposed anesthesia with the patient or authorized representative who has indicated his/her understanding and acceptance.     Dental advisory given  Plan Discussed with: CRNA  Anesthesia Plan Comments:         Anesthesia Quick Evaluation

## 2021-05-08 NOTE — Progress Notes (Signed)
PT Cancellation Note  Patient Details Name: Sheri Morgan MRN: [id number] DOB: 03-30-1945   Cancelled Treatment:    Reason Eval/Treat Not Completed: Patient at procedure or test/unavailable   [name] B [name] 05/08/2021, 7:46 AM Bayard Males, PT Acute Rehabilitation Services Pager: [phone] Office: [phone]

## 2021-05-08 NOTE — Transfer of Care (Signed)
Immediate Anesthesia Transfer of Care Note  Patient: Sheri Morgan  Procedure(s) Performed: TRANSESOPHAGEAL ECHOCARDIOGRAM (TEE) CARDIOVERSION  Patient Location: PACU and Endoscopy Unit  Anesthesia Type:MAC  Level of Consciousness: awake  Airway & Oxygen Therapy: Patient Spontanous Breathing  Post-op Assessment: Report given to RN and Post -op Vital signs reviewed and stable  Post vital signs: Reviewed and stable  Last Vitals:  Vitals Value Taken Time  BP    Temp    Pulse 65 05/08/21 0843  Resp 19 05/08/21 0843  SpO2 100 % 05/08/21 0843  Vitals shown include unvalidated device data.  Last Pain:  Vitals:   05/08/21 0722  TempSrc: Oral  PainSc: 0-No pain      Patients Stated Pain Goal: 0 (79/15/05 6979)  Complications: No notable events documented.

## 2021-05-08 NOTE — Progress Notes (Signed)
°  Pt device site stable.   Outer bandage removed. Steri-strips left in place.   Revisited wound care instructions.   EP follow up as scheduled.   Legrand Como [address]" [place], PA-C  05/08/2021 12:22 PM

## 2021-05-08 NOTE — Plan of Care (Signed)
  Problem: Activity: Goal: Risk for activity intolerance will decrease Outcome: Progressing   Problem: Pain Managment: Goal: General experience of comfort will improve Outcome: Progressing   Problem: Safety: Goal: Ability to remain free from injury will improve Outcome: Progressing   

## 2021-05-08 NOTE — Anesthesia Postprocedure Evaluation (Signed)
Anesthesia Post Note  Patient: Sheri Morgan  Procedure(s) Performed: TRANSESOPHAGEAL ECHOCARDIOGRAM (TEE) CARDIOVERSION     Patient location during evaluation: PACU Anesthesia Type: General Level of consciousness: awake Pain management: pain level controlled Vital Signs Assessment: post-procedure vital signs reviewed and stable Respiratory status: spontaneous breathing Cardiovascular status: stable Postop Assessment: no apparent nausea or vomiting Anesthetic complications: no   No notable events documented.  Last Vitals:  Vitals:   05/08/21 0845 05/08/21 0853  BP: 106/73 (!) 98/52  Pulse: 61 60  Resp: 19 15  Temp: (!) 36.2 C   SpO2: 100% 99%    Last Pain:  Vitals:   05/08/21 0853  TempSrc:   PainSc: 0-No pain                 Caren Macadam

## 2021-05-08 NOTE — CV Procedure (Signed)
° °  TRANSESOPHAGEAL ECHOCARDIOGRAM GUIDED DIRECT CURRENT CARDIOVERSION  NAME:  Sheri Morgan   MRN: [id number] DOB:  1945/08/26   ADMIT DATE: 04/23/2021  INDICATIONS:  Atrial flutter  PROCEDURE:   Informed consent was obtained prior to the procedure. The risks, benefits and alternatives for the procedure were discussed and the patient comprehended these risks.  Risks include, but are not limited to, cough, sore throat, vomiting, nausea, somnolence, esophageal and stomach trauma or perforation, bleeding, low blood pressure, aspiration, pneumonia, infection, trauma to the teeth and death.    After a procedural time-out, the oropharynx was anesthetized and the patient was sedated by the anesthesia service. The transesophageal probe was inserted in the esophagus and stomach without difficulty and multiple views were obtained.   FINDINGS:  LEFT VENTRICLE: EF = Dilated. EF 20-25%  RIGHT VENTRICLE:Moderately HK  LEFT ATRIUM: Severely dilated  LEFT ATRIAL APPENDAGE: No Clot  RIGHT ATRIUM: Severely dilated  AORTIC VALVE:  Trileaflet.  Trivial AI  MITRAL VALVE:    Normal Mild MR  TRICUSPID VALVE: Normal mild TR  PULMONIC VALVE: Normal Trivial PR  INTERATRIAL SEPTUM: No PFO or ASD  PERICARDIUM: Normal  DESCENDING AORTA: Mild plaque   CARDIOVERSION:     Indications:  Atrial Flutter  Procedure Details:  Once the TEE was complete, the patient had the defibrillator pads placed in the anterior and posterior position. Once an appropriate level of sedation was achieved, the patient received a single biphasic, synchronized 150J shock with prompt conversion to sinus rhythm. No apparent complications.   At the end of the procedure we had the device rep interrogate the device. Underlying atrial rate in 40s. With back-up pacing at 50. We turned up rate to 60 with preserved atrial pacing and marked reduction in PAC and PVc burden.   Arvilla Meres, MD  8:33 AM

## 2021-05-08 NOTE — Progress Notes (Signed)
°  Echocardiogram Echocardiogram Transesophageal has been performed.  Sheri Morgan M 05/08/2021, 8:48 AM

## 2021-05-08 NOTE — Progress Notes (Signed)
Patient ID: Sheri Morgan, adult   DOB: 1945-04-29, 76 y.o.   MRN: [id number]    Advanced Heart Failure Rounding Note   Subjective:    02/14: DBA stopped 02/16: Underwent DCCV AFL>>NSR 02/18: Recurrent AFL, restarted po amio 02/20: S/p dual chamber [employer] ICD placement 2/21 Mexiletine stopped and Amiodarone amio drip started.   Tolerating Eliquis. No bleeding at pacer site.   Denies CP or SOB. Remains in AF. SCR up to 1.7 weight down 1 pound   Objective:     Vital Signs:   Temp:  [97.8 F (36.6 C)-98.4 F (36.9 C)] 97.8 F (36.6 C) (02/24 0722) Pulse Rate:  [54-63] 55 (02/24 0722) Resp:  [16-18] 17 (02/24 0722) BP: (120-155)/(54-81) 155/81 (02/24 0722) SpO2:  [97 %-100 %] 100 % (02/24 0722) Weight:  [88.8 kg] 88.8 kg (02/24 0300) Last BM Date : 05/04/21  Weight change: Filed Weights   05/06/21 0510 05/07/21 0525 05/08/21 0300  Weight: 89.3 kg 89.1 kg 88.8 kg    Intake/Output:   Intake/Output Summary (Last 24 hours) at 05/08/2021 0808 Last data filed at 05/08/2021 0600 Gross per 24 hour  Intake 980.12 ml  Output --  Net 980.12 ml     PHYSICAL EXAM: General:  Lying flat in bed. No resp difficulty HEENT: normal Neck: supple. no JVD. Carotids 2+ bilat; no bruits. No lymphadenopathy or thryomegaly appreciated. Cor: PMI nondisplaced. Ittegular rate & rhythm. No rubs, gallops or murmurs. ICD site ok  Lungs: clear Abdomen: soft, nontender, nondistended. No hepatosplenomegaly. No bruits or masses. Good bowel sounds. Extremities: no cyanosis, clubbing, rash, edema Neuro: alert & orientedx3, cranial nerves grossly intact. moves all 4 extremities w/o difficulty. Affect pleasant    Telemetry: A flutter 50-60s,  PVCs <10/min   Labs: Basic Metabolic Panel: Recent Labs  Lab 05/03/21 0324 05/04/21 0315 05/05/21 0459 05/06/21 0316 05/07/21 0357  NA 137 136 136 134* 134*  K 4.3 4.2 4.7 4.2 4.6  CL 103 104 106 103 103  CO2 27 24 19* 20* 23  GLUCOSE 106* 110*  90 122* 111*  BUN 21 25* 26* 29* 26*  CREATININE 1.30* 1.44* 1.47* 1.60* 1.74*  CALCIUM 8.5* 8.6* 8.4* 8.5* 8.7*  MG  --  2.4  --   --  2.3     Liver Function Tests: No results for input(s): AST, ALT, ALKPHOS, BILITOT, PROT, ALBUMIN in the last 168 hours.  No results for input(s): LIPASE, AMYLASE in the last 168 hours. No results for input(s): AMMONIA in the last 168 hours.  CBC: Recent Labs  Lab 05/02/21 0451 05/03/21 0324 05/04/21 0315 05/05/21 0459  WBC 6.8 7.3 9.1 10.1  HGB 12.1 13.3 13.4 13.7  HCT 36.2 40.3 40.7 40.4  MCV 90.0 91.2 90.6 90.0  PLT 121* 144* 142* 133*     Cardiac Enzymes: No results for input(s): CKTOTAL, CKMB, CKMBINDEX, TROPONINI in the last 168 hours.  BNP: BNP (last 3 results) Recent Labs    04/23/21 2144  BNP 379.1*     ProBNP (last 3 results) No results for input(s): PROBNP in the last 8760 hours.    Other results:  Imaging: No results found.   Medications:     Scheduled Medications:  [MAR Hold] apixaban  5 mg Oral BID   [MAR Hold] calcium citrate  200 mg of elemental calcium Oral BID   [MAR Hold] Chlorhexidine Gluconate Cloth  6 each Topical Daily   [MAR Hold] cholecalciferol  1,000 Units Oral Daily   [MAR Hold]  dapagliflozin propanediol  10 mg Oral Daily   [MAR Hold] folic acid  1 mg Oral Daily   [MAR Hold] insulin aspart  0-9 Units Subcutaneous TID WC   [MAR Hold] levothyroxine  200 mcg Oral QAC breakfast   [MAR Hold] melatonin  3 mg Oral QHS   [MAR Hold] mupirocin ointment  1 application Nasal BID   [MAR Hold] pantoprazole  40 mg Oral Daily   [MAR Hold] polyethylene glycol  17 g Oral Daily   [MAR Hold] predniSONE  30 mg Oral Q breakfast   [MAR Hold] rosuvastatin  40 mg Oral Daily   [MAR Hold] senna  1 tablet Oral Daily   [MAR Hold] sodium chloride flush  10-40 mL Intracatheter Q12H   [MAR Hold] sorbitol  30 mL Oral Once   [MAR Hold] sulfamethoxazole-trimethoprim  1 tablet Oral Once per day on Mon Wed Fri     Infusions:  sodium chloride     amiodarone 30 mg/hr (05/08/21 0434)      PRN Medications: [MAR Hold] acetaminophen, [MAR Hold] naphazoline-glycerin, [MAR Hold] nitroGLYCERIN, [MAR Hold] ondansetron (ZOFRAN) IV, [MAR Hold] senna-docusate, [MAR Hold] sodium chloride flush   Assessment/Plan:   1.  Acute on chronic systolic HF due to NICM -> cardiogenic shock. Suspect cardiac sarcoid based on cardiac MRI.  - 5/22 ECHO EF 30-35% - Cath 2/23 minimal CAD. hstroponin 204 -> 379 - Echo 2/23: EF 20-25% - cMRI EF 11% RVEF 15% LGE pattern concerning for sarcoid. (D/w Dr. Bjorn Pippin)  - Off DBA. S/P ICDm  - Volume status stable. She does not need diuretics.  Sherryll Burger and Cleda Daub on hold w/ soft BP and AKI   - No BMET yet today. Await labs. If SCr worsening can give some IVF - Once renal function better start losartan - Continue Farxiga 10 mg daily  -Now on the below for  sarcoid  Immunosuppressive Therapy for Cardiac Sarcoid  Prednisone (04/29/21) Weeks 1-4: 30 mg daily  Methotrexate  (04/30/21) Weeks 1&2: 10 mg (four 2.5 mg tablets) once weekly Additional Medications  Bactrim DS : 1 tablet Mon-Wed- Fri while taking >15 mg of prednisone daily  Folic Acid : 1 mg daily  Calcium Citrate -- switch to twice a day   Vitamin D: 1000 IU total daily  On protonix 40 mg daily while taking any dose of prednisone.      2. PVCs/NSVT/PMVT - EP following.  - did not tolerate amiodarone previously due to worsening bradycardia - off lido due to n/v that said PVC burden much decreased with lido - mexiletine discontinued 2/21 per EP - Continue amio  - S/p ICD 02/23 - on steroids. Plan as above - ACE level normal (52 U/L)  - Keep K> 4.0 Mg > 2.0    3. Paroxysmal Atrial Flutter - S/p DCCV 2/16 - Now back in AFL on 2/18.  - Continue Eliquis  - Continue amio drip.   - Plan for TEE/DCCV this am   4. Hypokalemia - rBMET pending  5. AKI - SCr 1.47>>1.60>>1.74> pending - BP ok  - continue to hold  Entresto and Cleda Daub  - encouraged PO intake   6. Constipation  - improved  Arvilla Meres MD  05/08/2021 8:08 AM

## 2021-05-08 NOTE — Progress Notes (Signed)
St jude device rep called by Dr Haroldine Laws to interrogate device post cardioversion

## 2021-05-09 ENCOUNTER — Encounter (HOSPITAL_COMMUNITY): Payer: Self-pay | Admitting: Internal Medicine

## 2021-05-09 DIAGNOSIS — I5042 Chronic combined systolic (congestive) and diastolic (congestive) heart failure: Secondary | ICD-10-CM

## 2021-05-09 DIAGNOSIS — N179 Acute kidney failure, unspecified: Secondary | ICD-10-CM

## 2021-05-09 DIAGNOSIS — I4892 Unspecified atrial flutter: Secondary | ICD-10-CM | POA: Diagnosis not present

## 2021-05-09 DIAGNOSIS — I5023 Acute on chronic systolic (congestive) heart failure: Secondary | ICD-10-CM | POA: Diagnosis not present

## 2021-05-09 DIAGNOSIS — D8685 Sarcoid myocarditis: Secondary | ICD-10-CM

## 2021-05-09 DIAGNOSIS — I4729 Other ventricular tachycardia: Secondary | ICD-10-CM

## 2021-05-09 DIAGNOSIS — I480 Paroxysmal atrial fibrillation: Secondary | ICD-10-CM

## 2021-05-09 DIAGNOSIS — I2510 Atherosclerotic heart disease of native coronary artery without angina pectoris: Secondary | ICD-10-CM

## 2021-05-09 DIAGNOSIS — R570 Cardiogenic shock: Secondary | ICD-10-CM | POA: Diagnosis not present

## 2021-05-09 DIAGNOSIS — I5043 Acute on chronic combined systolic (congestive) and diastolic (congestive) heart failure: Secondary | ICD-10-CM

## 2021-05-09 DIAGNOSIS — Z95810 Presence of automatic (implantable) cardiac defibrillator: Secondary | ICD-10-CM

## 2021-05-09 DIAGNOSIS — I472 Ventricular tachycardia, unspecified: Secondary | ICD-10-CM

## 2021-05-09 LAB — CBC
HCT: 35.7 % — ABNORMAL LOW (ref 36.0–46.0)
Hemoglobin: 12 g/dL (ref 12.0–15.0)
MCH: 30.2 pg (ref 26.0–34.0)
MCHC: 33.6 g/dL (ref 30.0–36.0)
MCV: 89.9 fL (ref 80.0–100.0)
Platelets: 94 10*3/uL — ABNORMAL LOW (ref 150–400)
RBC: 3.97 MIL/uL (ref 3.87–5.11)
RDW: 17 % — ABNORMAL HIGH (ref 11.5–15.5)
WBC: 9.9 10*3/uL (ref 4.0–10.5)
nRBC: 0 % (ref 0.0–0.2)

## 2021-05-09 LAB — BASIC METABOLIC PANEL
Anion gap: 8 (ref 5–15)
BUN: 19 mg/dL (ref 8–23)
CO2: 25 mmol/L (ref 22–32)
Calcium: 8.9 mg/dL (ref 8.9–10.3)
Chloride: 106 mmol/L (ref 98–111)
Creatinine, Ser: 1.35 mg/dL — ABNORMAL HIGH (ref 0.44–1.00)
GFR, Estimated: 40 mL/min — ABNORMAL LOW (ref >60)
Glucose, Bld: 80 mg/dL (ref 70–99)
Potassium: 5 mmol/L (ref 3.5–5.1)
Sodium: 139 mmol/L (ref 135–145)

## 2021-05-09 LAB — GLUCOSE, CAPILLARY
Glucose-Capillary: 72 mg/dL (ref 70–99)
Glucose-Capillary: 85 mg/dL (ref 70–99)
Glucose-Capillary: 138 mg/dL — ABNORMAL HIGH (ref 70–99)
Glucose-Capillary: 128 mg/dL — ABNORMAL HIGH (ref 70–99)

## 2021-05-09 LAB — MAGNESIUM: Magnesium: 2.4 mg/dL (ref 1.7–2.4)

## 2021-05-09 MED ORDER — FUROSEMIDE 10 MG/ML IJ SOLN
40.0000 mg | Freq: Once | INTRAMUSCULAR | Status: AC
  Administered 2021-05-09: 40 mg via INTRAVENOUS
  Filled 2021-05-09: qty 4

## 2021-05-09 MED ORDER — SULFAMETHOXAZOLE-TRIMETHOPRIM 800-160 MG PO TABS
1.0000 | ORAL_TABLET | ORAL | Status: DC
  Administered 2021-05-11: 1 via ORAL
  Filled 2021-05-09: qty 1

## 2021-05-09 NOTE — Progress Notes (Signed)
CARDIAC REHAB PHASE I   Came to walk pt. Pt just had her CBG done and it was low (85). Nurse tech gave pt juice. Encouraged pt to walk later. Helped pt to the bathroom and back to bed w/ call bell in reach. Will continue to follow.   [id number] Joya San, MS, ACSM-CEP 05/09/2021 12:04 PM

## 2021-05-09 NOTE — Progress Notes (Signed)
Patient ID: Sheri Morgan, adult   DOB: [birth year], 76 y.o.   MRN: [id number]    Advanced Heart Failure Rounding Note   Subjective:    02/14: DBA stopped 02/16: Underwent DCCV AFL>>NSR 02/18: Recurrent AFL, restarted po amio 02/20: S/p dual chamber [employer] Jude ICD placement 2/21 Mexiletine stopped and Amiodarone amio drip started.  2/24 TEE/DC-CV -> NSR  Underwent TEE/DC-CV yesterday. Remains in NSR On amio. Back-up pacing rate turned up to 60.   Feels weak. Denies CP or SOB.   Objective:     Vital Signs:   Temp:  [97.1 F (36.2 C)-98.2 F (36.8 C)] 97.6 F (36.4 C) (02/25 0500) Pulse Rate:  [59-80] 59 (02/25 0607) Resp:  [14-19] 14 (02/25 0500) BP: (98-126)/(52-77) 120/77 (02/25 0500) SpO2:  [97 %-100 %] 99 % (02/25 0607) Weight:  [91.6 kg] 91.6 kg (02/25 0627) Last BM Date : 05/07/21  Weight change: Filed Weights   05/07/21 0525 05/08/21 0300 05/09/21 0627  Weight: 89.1 kg 88.8 kg 91.6 kg    Intake/Output:   Intake/Output Summary (Last 24 hours) at 05/09/2021 0836 Last data filed at 05/09/2021 [id number] Gross per 24 hour  Intake --  Output 801 ml  Net -801 ml      PHYSICAL EXAM: General:  Sitting up  No resp difficulty HEENT: normal Neck: supple. JVP 9-10  Carotids 2+ bilat; no bruits. No lymphadenopathy or thryomegaly appreciated. Cor: PMI nondisplaced. Regular rate & rhythm. No rubs, gallops or murmurs. ICD site with minimal hematoma Lungs: clear Abdomen: soft, nontender, nondistended. No hepatosplenomegaly. No bruits or masses. Good bowel sounds. Extremities: no cyanosis, clubbing, rash, edema Neuro: alert & orientedx3, cranial nerves grossly intact. moves all 4 extremities w/o difficulty. Affect pleasant   Telemetry: Sinus/a pacing 50-60s + PACs/PVCs Personally reviewed    Labs: Basic Metabolic Panel: Recent Labs  Lab 05/04/21 0315 05/05/21 0459 05/06/21 0316 05/07/21 0357 05/08/21 1114 05/08/21 1304 05/09/21 0434  NA 136   < > 134* 134* 139  139 139  K 4.2   < > 4.2 4.6 5.9* 4.2 5.0  CL 104   < > 103 103 105 104 106  CO2 24   < > 20* 23 24 25 25   GLUCOSE 110*   < > 122* 111* 89 112* 80  BUN 25*   < > 29* 26* 23 20 19   CREATININE 1.44*   < > 1.60* 1.74* 1.48* 1.48* 1.35*  CALCIUM 8.6*   < > 8.5* 8.7* 9.1 9.0 8.9  MG 2.4  --   --  2.3  --   --  2.4   < > = values in this interval not displayed.     Liver Function Tests: No results for input(s): AST, ALT, ALKPHOS, BILITOT, PROT, ALBUMIN in the last 168 hours.  No results for input(s): LIPASE, AMYLASE in the last 168 hours. No results for input(s): AMMONIA in the last 168 hours.  CBC: Recent Labs  Lab 05/03/21 0324 05/04/21 0315 05/05/21 0459 05/08/21 1114 05/09/21 0434  WBC 7.3 9.1 10.1 10.1 9.9  HGB 13.3 13.4 13.7 13.8 12.0  HCT 40.3 40.7 40.4 41.2 35.7*  MCV 91.2 90.6 90.0 91.2 89.9  PLT 144* 142* 133* PLATELET CLUMPS NOTED ON SMEAR, UNABLE TO ESTIMATE 94*     Cardiac Enzymes: No results for input(s): CKTOTAL, CKMB, CKMBINDEX, TROPONINI in the last 168 hours.  BNP: BNP (last 3 results) Recent Labs    04/23/21 2144  BNP 379.1*     ProBNP (last 3  results) No results for input(s): PROBNP in the last 8760 hours.    Other results:  Imaging: ECHO TEE  Result Date: 05/08/2021    TRANSESOPHOGEAL ECHO REPORT   Patient Name:   RAYETTE MALLAS Date of Exam: 05/08/2021 Medical Rec #:  [id number]      Height:       63.0 in Accession #:    [id number]     Weight:       195.8 lb Date of Birth:  [birth year]     BSA:          1.916 m Patient Age:    33 years       BP:           123/111 mmHg Patient Gender: F              HR:           63 bpm. Exam Location:  Inpatient Procedure: Transesophageal Echo and Color Doppler Indications:     Atrial flutter I48.92  History:         Patient has prior history of Echocardiogram examinations, most                  recent 04/24/2021. CHF, Defibrillator, Arrythmias:PVC and PAC;                  Risk Factors:Hypertension.  Sonographer:      Darlina Sicilian RDCS Referring Phys:  [id number] Adaja Wander R Mayela Bullard Diagnosing Phys: Glori Bickers MD PROCEDURE: After discussion of the risks and benefits of a TEE, an informed consent was obtained from the patient. TEE procedure time was 6 minutes. The transesophogeal probe was passed without difficulty through the esophogus of the patient. Imaged were  obtained with the patient in a supine position. Sedation performed by different physician. The patient was monitored while under deep sedation. Anesthestetic sedation was provided intravenously by Anesthesiology: 182.12mg  of Propofol. Image quality was good. The patient's vital signs; including heart rate, blood pressure, and oxygen saturation; remained stable throughout the procedure. The patient developed no complications during the procedure. A successful direct current cardioversion was performed at 150 joules with 1 attempt. IMPRESSIONS  1. Left ventricular ejection fraction, by estimation, is 20 to 25%. The left ventricle has severely decreased function. The left ventricular internal cavity size was moderately dilated.  2. Right ventricular systolic function is moderately reduced. The right ventricular size is mildly enlarged.  3. Left atrial size was severely dilated. No left atrial/left atrial appendage thrombus was detected.  4. Right atrial size was severely dilated.  5. The mitral valve is normal in structure. Mild mitral valve regurgitation.  6. The aortic valve is tricuspid. Aortic valve regurgitation is trivial.  7. There is mild (Grade II) plaque involving the descending aorta. FINDINGS  Left Ventricle: Left ventricular ejection fraction, by estimation, is 20 to 25%. The left ventricle has severely decreased function. The left ventricular internal cavity size was moderately dilated. Right Ventricle: The right ventricular size is mildly enlarged. No increase in right ventricular wall thickness. Right ventricular systolic function is moderately  reduced. Left Atrium: Left atrial size was severely dilated. No left atrial/left atrial appendage thrombus was detected. Right Atrium: Right atrial size was severely dilated. Pericardium: There is no evidence of pericardial effusion. Mitral Valve: The mitral valve is normal in structure. Mild mitral valve regurgitation. Tricuspid Valve: The tricuspid valve is normal in structure. Tricuspid valve regurgitation is mild. Aortic Valve: The aortic valve is tricuspid. Aortic valve  regurgitation is trivial. Pulmonic Valve: The pulmonic valve was normal in structure. Pulmonic valve regurgitation is trivial. Aorta: The aortic root is normal in size and structure. There is mild (Grade II) plaque involving the descending aorta. IAS/Shunts: No atrial level shunt detected by color flow Doppler.  LEFT VENTRICLE PLAX 2D LVOT diam:     1.70 cm LVOT Area:     2.27 cm   AORTA Ao Root diam: 2.90 cm Ao Asc diam:  2.70 cm  SHUNTS Systemic Diam: 1.70 cm Glori Bickers MD Electronically signed by Glori Bickers MD Signature Date/Time: 05/08/2021/4:00:26 PM    Final      Medications:     Scheduled Medications:  apixaban  5 mg Oral BID   calcium citrate  200 mg of elemental calcium Oral BID   cholecalciferol  1,000 Units Oral Daily   dapagliflozin propanediol  10 mg Oral Daily   folic acid  1 mg Oral Daily   furosemide  40 mg Intravenous Once   insulin aspart  0-9 Units Subcutaneous TID WC   levothyroxine  200 mcg Oral QAC breakfast   melatonin  3 mg Oral QHS   [START ON 05/14/2021] methotrexate  12.5 mg Oral Weekly   mupirocin ointment  1 application Nasal BID   pantoprazole  40 mg Oral Daily   polyethylene glycol  17 g Oral Daily   predniSONE  30 mg Oral Q breakfast   rosuvastatin  40 mg Oral Daily   senna  1 tablet Oral Daily   sodium chloride flush  10-40 mL Intracatheter Q12H   sorbitol  30 mL Oral Once   sulfamethoxazole-trimethoprim  1 tablet Oral Once per day on Mon Wed Fri    Infusions:  amiodarone 30  mg/hr (05/09/21 0455)      PRN Medications: acetaminophen, naphazoline-glycerin, nitroGLYCERIN, ondansetron (ZOFRAN) IV, senna-docusate, sodium chloride flush   Assessment/Plan:   1.  Acute on chronic systolic HF due to NICM -> cardiogenic shock. Suspect cardiac sarcoid based on cardiac MRI.  - 5/22 ECHO EF 30-35% - Cath 2/23 minimal CAD. hstroponin 204 -> 379 - Echo 2/23: EF 20-25% - cMRI EF 11% RVEF 15% LGE pattern concerning for sarcoid. (D/w Dr. Gardiner Rhyme)  - Only 2 small areas of sarcoid on cMRI. Suspect EF down mostly due to frequent PVCs. Should recover with suppression - Initially on DBA for support. No off - Continue diogxin 0.125 - Continue Farxiga 10 - Off Entresto and Spiro on hold w/ soft BP and AKI/hyperkalemia - Holding b-blocker with bradycardia/low output - Would like to add losartan 12.5 when k ok - Volume overloaded now. Will give lasix 40 IV x 1  2. Cardiac sarcoidosis - based on cMRI and clinical course - ACE level normal (52 U/L). CT chest. No extra-cardiac sarcoid Immunosuppressive Therapy for Cardiac Sarcoid  Prednisone (04/29/21) Weeks 1-4: 30 mg daily  Methotrexate  (04/30/21) Weeks 1&2: 10 mg (four 2.5 mg tablets) once weekly Additional Medications  Bactrim DS : 1 tablet Mon-Wed- Fri while taking >15 mg of prednisone daily  Folic Acid : 1 mg daily  Calcium Citrate -- switch to twice a day   Vitamin D: 1000 IU total daily  On protonix 40 mg daily while taking any dose of prednisone.      3. PVCs/NSVT/PMVT - EP following.  - did not tolerate amiodarone previously due to worsening bradycardia - off lido due to n/v that said PVC burden much decreased with lido - mexiletine discontinued 2/21 per EP - S/p  ICD 02/23 - Now back on amio  - Keep K> 4.0 Mg > 2.0    4. Paroxysmal Atrial Flutter - S/p DCCV 2/16 -> back in AFL on 2/18.  - Continue Eliquis  - s/p TEE/DCCV t2/24  - Continue amio  4. Hypokalemia/hyperkalemia - BMET was hemolyzed yesterday.  Repeat 4.2. K today 5.0 - Follow  5. AKI - SCr 1.47>>1.60>>1.74> 1.35 - BP ok  - continue to hold Delene Loll and Arlyce Harman  - encouraged PO intake    Glori Bickers MD  05/09/2021 8:36 AM

## 2021-05-10 ENCOUNTER — Encounter (HOSPITAL_COMMUNITY): Payer: Self-pay | Admitting: Internal Medicine

## 2021-05-10 DIAGNOSIS — I5023 Acute on chronic systolic (congestive) heart failure: Secondary | ICD-10-CM | POA: Diagnosis not present

## 2021-05-10 DIAGNOSIS — R570 Cardiogenic shock: Secondary | ICD-10-CM | POA: Diagnosis not present

## 2021-05-10 DIAGNOSIS — I4892 Unspecified atrial flutter: Secondary | ICD-10-CM | POA: Diagnosis not present

## 2021-05-10 LAB — CBC
HCT: 35.8 % — ABNORMAL LOW (ref 36.0–46.0)
Hemoglobin: 12 g/dL (ref 12.0–15.0)
MCH: 29.9 pg (ref 26.0–34.0)
MCHC: 33.5 g/dL (ref 30.0–36.0)
MCV: 89.3 fL (ref 80.0–100.0)
Platelets: 94 10*3/uL — ABNORMAL LOW (ref 150–400)
RBC: 4.01 MIL/uL (ref 3.87–5.11)
RDW: 16.3 % — ABNORMAL HIGH (ref 11.5–15.5)
WBC: 8 10*3/uL (ref 4.0–10.5)
nRBC: 0 % (ref 0.0–0.2)

## 2021-05-10 LAB — GLUCOSE, CAPILLARY
Glucose-Capillary: 78 mg/dL (ref 70–99)
Glucose-Capillary: 113 mg/dL — ABNORMAL HIGH (ref 70–99)
Glucose-Capillary: 134 mg/dL — ABNORMAL HIGH (ref 70–99)
Glucose-Capillary: 161 mg/dL — ABNORMAL HIGH (ref 70–99)

## 2021-05-10 LAB — BASIC METABOLIC PANEL
Anion gap: 8 (ref 5–15)
BUN: 22 mg/dL (ref 8–23)
CO2: 27 mmol/L (ref 22–32)
Calcium: 8.9 mg/dL (ref 8.9–10.3)
Chloride: 104 mmol/L (ref 98–111)
Creatinine, Ser: 1.62 mg/dL — ABNORMAL HIGH (ref 0.44–1.00)
GFR, Estimated: 33 mL/min — ABNORMAL LOW (ref >60)
Glucose, Bld: 99 mg/dL (ref 70–99)
Potassium: 4.1 mmol/L (ref 3.5–5.1)
Sodium: 139 mmol/L (ref 135–145)

## 2021-05-10 MED ORDER — MEXILETINE HCL 200 MG PO CAPS
200.0000 mg | ORAL_CAPSULE | Freq: Two times a day (BID) | ORAL | Status: DC
Start: 2021-05-10 — End: 2021-05-11
  Administered 2021-05-10 – 2021-05-11 (×2): 200 mg via ORAL
  Filled 2021-05-10 (×3): qty 1

## 2021-05-10 MED ORDER — AMIODARONE HCL 200 MG PO TABS
200.0000 mg | ORAL_TABLET | Freq: Every day | ORAL | Status: DC
  Administered 2021-05-10 – 2021-05-11 (×2): 200 mg via ORAL
  Filled 2021-05-10 (×2): qty 1

## 2021-05-10 NOTE — Progress Notes (Addendum)
Patient ID: Sheri Morgan, adult   DOB: [birth year], 76 y.o.   MRN: [id number]    Advanced Heart Failure Rounding Note   Subjective:    02/14: DBA stopped 02/16: Underwent DCCV AFL>>NSR 02/18: Recurrent AFL, restarted po amio 02/20: S/p dual chamber [employer] Jude ICD placement 2/21 Mexiletine stopped and Amiodarone amio drip started.  2/24 TEE/DC-CV -> NSR  Underwent TEE/DC-CV yesterday. Remains in NSR On amio. Back-up pacing rate turned up to 60.   Given IV lasix yesterday. No urine output charted. Weight down at least 3 pounds.   Remains weak. Denies SOB, orthopnea or PND.   SCr 1.4-> 1.6. On apixaban. HGb stable   Objective:     Vital Signs:   Temp:  [97.6 F (36.4 C)-98.7 F (37.1 C)] 98.7 F (37.1 C) (02/26 0853) Pulse Rate:  [56-61] 56 (02/26 0853) BP: (119-132)/(61-94) 119/78 (02/26 0853) SpO2:  [92 %-100 %] 99 % (02/26 0853) Weight:  [87.4 kg] 87.4 kg (02/26 0425) Last BM Date : 05/09/21  Weight change: Filed Weights   05/08/21 0300 05/09/21 0627 05/10/21 0425  Weight: 88.8 kg 91.6 kg 87.4 kg    Intake/Output:   Intake/Output Summary (Last 24 hours) at 05/10/2021 1013 Last data filed at 05/10/2021 0600 Gross per 24 hour  Intake 1419.86 ml  Output --  Net 1419.86 ml      PHYSICAL EXAM: General:  Sitting up. No resp difficulty HEENT: normal Neck: supple. no JVD. Carotids 2+ bilat; no bruits. No lymphadenopathy or thryomegaly appreciated. Cor: Regular rate & rhythm. No rubs, gallops or murmurs. ICD site ok  Lungs: clear Abdomen: soft, nontender, nondistended. No hepatosplenomegaly. No bruits or masses. Good bowel sounds. Extremities: no cyanosis, clubbing, rash, edema Neuro: alert & orientedx3, cranial nerves grossly intact. moves all 4 extremities w/o difficulty. Affect pleasant   Telemetry: Sinus/a pacing 50-60s + PACs/PVCs PVCs ~5-10/min (10-15%) Personally reviewed    Labs: Basic Metabolic Panel: Recent Labs  Lab 05/04/21 0315 05/05/21 0459  05/07/21 0357 05/08/21 1114 05/08/21 1304 05/09/21 0434 05/10/21 0244  NA 136   < > 134* 139 139 139 139  K 4.2   < > 4.6 5.9* 4.2 5.0 4.1  CL 104   < > 103 105 104 106 104  CO2 24   < > 23 24 25 25 27   GLUCOSE 110*   < > 111* 89 112* 80 99  BUN 25*   < > 26* 23 20 19 22   CREATININE 1.44*   < > 1.74* 1.48* 1.48* 1.35* 1.62*  CALCIUM 8.6*   < > 8.7* 9.1 9.0 8.9 8.9  MG 2.4  --  2.3  --   --  2.4  --    < > = values in this interval not displayed.     Liver Function Tests: No results for input(s): AST, ALT, ALKPHOS, BILITOT, PROT, ALBUMIN in the last 168 hours.  No results for input(s): LIPASE, AMYLASE in the last 168 hours. No results for input(s): AMMONIA in the last 168 hours.  CBC: Recent Labs  Lab 05/04/21 0315 05/05/21 0459 05/08/21 1114 05/09/21 0434 05/10/21 0244  WBC 9.1 10.1 10.1 9.9 8.0  HGB 13.4 13.7 13.8 12.0 12.0  HCT 40.7 40.4 41.2 35.7* 35.8*  MCV 90.6 90.0 91.2 89.9 89.3  PLT 142* 133* PLATELET CLUMPS NOTED ON SMEAR, UNABLE TO ESTIMATE 94* 94*     Cardiac Enzymes: No results for input(s): CKTOTAL, CKMB, CKMBINDEX, TROPONINI in the last 168 hours.  BNP: BNP (last 3  results) Recent Labs    04/23/21 2144  BNP 379.1*     ProBNP (last 3 results) No results for input(s): PROBNP in the last 8760 hours.    Other results:  Imaging: No results found.   Medications:     Scheduled Medications:  apixaban  5 mg Oral BID   calcium citrate  200 mg of elemental calcium Oral BID   cholecalciferol  1,000 Units Oral Daily   dapagliflozin propanediol  10 mg Oral Daily   folic acid  1 mg Oral Daily   insulin aspart  0-9 Units Subcutaneous TID WC   levothyroxine  200 mcg Oral QAC breakfast   melatonin  3 mg Oral QHS   [START ON 05/14/2021] methotrexate  12.5 mg Oral Weekly   pantoprazole  40 mg Oral Daily   polyethylene glycol  17 g Oral Daily   predniSONE  30 mg Oral Q breakfast   rosuvastatin  40 mg Oral Daily   senna  1 tablet Oral Daily    sodium chloride flush  10-40 mL Intracatheter Q12H   sorbitol  30 mL Oral Once   [START ON 05/11/2021] sulfamethoxazole-trimethoprim  1 tablet Oral Q M,W,F    Infusions:  amiodarone 30 mg/hr (05/10/21 0544)      PRN Medications: acetaminophen, naphazoline-glycerin, nitroGLYCERIN, ondansetron (ZOFRAN) IV, senna-docusate, sodium chloride flush   Assessment/Plan:   1.  Acute on chronic systolic HF due to NICM -> cardiogenic shock. Suspect cardiac sarcoid based on cardiac MRI.  - 5/22 ECHO EF 30-35% - Cath 2/23 minimal CAD. hstroponin 204 -> 379 - Echo 2/23: EF 20-25% - cMRI EF 11% RVEF 15% LGE pattern concerning for sarcoid. (D/w Dr. Gardiner Rhyme)  - Only 2 small areas of sarcoid on cMRI. Suspect EF down mostly due to frequent PVCs. Should recover with suppression - Initially on DBA for support. Now off - Volume status improved after IV lasix yesterday. Scr up slightly  - Continue Farxiga 10 - Off Delene Loll and Arlyce Harman on hold w/ soft BP and AKI/hyperkalemia - Holding b-blocker with bradycardia/low output - Would like to add losartan 12.5 when k ok. Will wait until clinic   2. Cardiac sarcoidosis - based on cMRI and clinical course - ACE level normal (52 U/L). CT chest. No extra-cardiac sarcoid Immunosuppressive Therapy for Cardiac Sarcoid  Prednisone (04/29/21) Weeks 1-4: 30 mg daily  Methotrexate  (04/30/21) Weeks 1&2: 10 mg (four 2.5 mg tablets) once weekly Additional Medications  Bactrim DS : 1 tablet Mon-Wed- Fri while taking >15 mg of prednisone daily  Folic Acid : 1 mg daily  Calcium Citrate -- switch to twice a day   Vitamin D: 1000 IU total daily  On protonix 40 mg daily while taking any dose of prednisone.      3. PVCs/NSVT/PMVT - EP following.  - did not tolerate amiodarone previously due to worsening bradycardia - off lido due to n/v that said PVC burden much decreased with lido - mexiletine discontinued 2/21 per EP - S/p ICD 02/23 - Switch amio to po 200 daily. PVCs  still not fully suppressed. Will add back mexilitene  - Keep K> 4.0 Mg > 2.0    4. Paroxysmal Atrial Flutter - S/p DCCV 2/16 -> back in AFL on 2/18.  - Continue Eliquis  - s/p TEE/DCCV t2/24  - Continue amio  4. Hypokalemia/hyperkalemia - Stable today  5. AKI - SCr 1.47>>1.60>>1.74> 1.35 -> 1.6 - BP ok  - continue to hold Delene Loll and Arlyce Harman  - encouraged  PO intake   Will hold until tomorrow to make sure renal function stable and we can fill meds at outpatient pharmacy.    Glori Bickers MD  05/10/2021 10:13 AM

## 2021-05-10 NOTE — Plan of Care (Signed)
°  Problem: Education: °Goal: Knowledge of General Education information will improve °Description: Including pain rating scale, medication(s)/side effects and non-pharmacologic comfort measures °Outcome: Progressing °  °Problem: Clinical Measurements: °Goal: Diagnostic test results will improve °Outcome: Progressing °  °Problem: Nutrition: °Goal: Adequate nutrition will be maintained °Outcome: Progressing °  °Problem: Coping: °Goal: Level of anxiety will decrease °Outcome: Progressing °  °

## 2021-05-10 NOTE — Plan of Care (Signed)
°  Problem: Education: Goal: Knowledge of General Education information will improve Description: Including pain rating scale, medication(s)/side effects and non-pharmacologic comfort measures Outcome: Progressing   Problem: Health Behavior/Discharge Planning: Goal: Ability to manage health-related needs will improve Outcome: Progressing   Problem: Clinical Measurements: Goal: Ability to maintain clinical measurements within normal limits will improve Outcome: Progressing Goal: Cardiovascular complication will be avoided Outcome: Progressing   Problem: Activity: Goal: Risk for activity intolerance will decrease Outcome: Progressing   Problem: Nutrition: Goal: Adequate nutrition will be maintained Outcome: Progressing   Problem: Pain Managment: Goal: General experience of comfort will improve Outcome: Progressing

## 2021-05-11 ENCOUNTER — Other Ambulatory Visit (HOSPITAL_COMMUNITY): Payer: Self-pay

## 2021-05-11 DIAGNOSIS — I4892 Unspecified atrial flutter: Secondary | ICD-10-CM | POA: Diagnosis not present

## 2021-05-11 DIAGNOSIS — I5023 Acute on chronic systolic (congestive) heart failure: Secondary | ICD-10-CM | POA: Diagnosis not present

## 2021-05-11 LAB — BASIC METABOLIC PANEL
Anion gap: 7 (ref 5–15)
BUN: 24 mg/dL — ABNORMAL HIGH (ref 8–23)
CO2: 27 mmol/L (ref 22–32)
Calcium: 9 mg/dL (ref 8.9–10.3)
Chloride: 104 mmol/L (ref 98–111)
Creatinine, Ser: 1.45 mg/dL — ABNORMAL HIGH (ref 0.44–1.00)
GFR, Estimated: 37 mL/min — ABNORMAL LOW (ref >60)
Glucose, Bld: 91 mg/dL (ref 70–99)
Potassium: 4.2 mmol/L (ref 3.5–5.1)
Sodium: 138 mmol/L (ref 135–145)

## 2021-05-11 LAB — CBC
HCT: 35 % — ABNORMAL LOW (ref 36.0–46.0)
Hemoglobin: 11.9 g/dL — ABNORMAL LOW (ref 12.0–15.0)
MCH: 30.4 pg (ref 26.0–34.0)
MCHC: 34 g/dL (ref 30.0–36.0)
MCV: 89.5 fL (ref 80.0–100.0)
Platelets: 92 10*3/uL — ABNORMAL LOW (ref 150–400)
RBC: 3.91 MIL/uL (ref 3.87–5.11)
RDW: 16.2 % — ABNORMAL HIGH (ref 11.5–15.5)
WBC: 8.6 10*3/uL (ref 4.0–10.5)
nRBC: 0 % (ref 0.0–0.2)

## 2021-05-11 LAB — GLUCOSE, CAPILLARY
Glucose-Capillary: 99 mg/dL (ref 70–99)
Glucose-Capillary: 93 mg/dL (ref 70–99)

## 2021-05-11 MED ORDER — PREDNISONE 10 MG PO TABS
30.0000 mg | ORAL_TABLET | Freq: Every day | ORAL | 6 refills | Status: DC
  Filled 2021-05-11: qty 90, 30d supply, fill #0

## 2021-05-11 MED ORDER — METHOTREXATE 2.5 MG PO TABS
ORAL_TABLET | ORAL | 0 refills | Status: DC
Start: 2021-05-11 — End: 2021-05-20
  Filled 2021-05-11: qty 25, 35d supply, fill #0

## 2021-05-11 MED ORDER — FOLIC ACID 1 MG PO TABS
1.0000 mg | ORAL_TABLET | Freq: Every day | ORAL | 6 refills | Status: DC
  Filled 2021-05-11: qty 30, 30d supply, fill #0

## 2021-05-11 MED ORDER — SULFAMETHOXAZOLE-TRIMETHOPRIM 800-160 MG PO TABS
1.0000 | ORAL_TABLET | ORAL | 6 refills | Status: DC
Start: 2021-05-13 — End: 2021-05-20
  Filled 2021-05-11: qty 12, 28d supply, fill #0

## 2021-05-11 MED ORDER — ROSUVASTATIN CALCIUM 40 MG PO TABS
40.0000 mg | ORAL_TABLET | Freq: Every day | ORAL | 2 refills | Status: DC
  Filled 2021-05-11: qty 30, 30d supply, fill #0

## 2021-05-11 MED ORDER — MEXILETINE HCL 200 MG PO CAPS
200.0000 mg | ORAL_CAPSULE | Freq: Two times a day (BID) | ORAL | 6 refills | Status: DC
  Filled 2021-05-11: qty 60, 30d supply, fill #0

## 2021-05-11 MED ORDER — DAPAGLIFLOZIN PROPANEDIOL 10 MG PO TABS
10.0000 mg | ORAL_TABLET | Freq: Every day | ORAL | 2 refills | Status: DC
  Filled 2021-05-11: qty 30, 30d supply, fill #0

## 2021-05-11 MED ORDER — VITAMIN D3 25 MCG PO TABS
1000.0000 [IU] | ORAL_TABLET | Freq: Every day | ORAL | 6 refills | Status: DC
  Filled 2021-05-11: qty 30, 30d supply, fill #0

## 2021-05-11 MED ORDER — FUROSEMIDE 40 MG PO TABS
ORAL_TABLET | ORAL | 0 refills | Status: DC
  Filled 2021-05-11: qty 30, 30d supply, fill #0

## 2021-05-11 MED ORDER — PANTOPRAZOLE SODIUM 40 MG PO TBEC
40.0000 mg | DELAYED_RELEASE_TABLET | Freq: Every day | ORAL | 6 refills | Status: DC
  Filled 2021-05-11: qty 30, 30d supply, fill #0

## 2021-05-11 MED ORDER — AMIODARONE HCL 200 MG PO TABS
200.0000 mg | ORAL_TABLET | Freq: Every day | ORAL | 6 refills | Status: DC
  Filled 2021-05-11: qty 30, 30d supply, fill #0

## 2021-05-11 MED ORDER — POTASSIUM CHLORIDE CRYS ER 20 MEQ PO TBCR
EXTENDED_RELEASE_TABLET | ORAL | 0 refills | Status: DC
  Filled 2021-05-11: qty 30, 30d supply, fill #0

## 2021-05-11 MED ORDER — CALCIUM CITRATE 950 (200 CA) MG PO TABS
200.0000 mg | ORAL_TABLET | Freq: Two times a day (BID) | ORAL | 6 refills | Status: DC
  Filled 2021-05-11: qty 30, 15d supply, fill #0

## 2021-05-11 NOTE — Progress Notes (Signed)
CARDIAC REHAB PHASE I   PRE:  Rate/Rhythm: 63 SR  MODE:  Ambulation: 470 ft   POST:  Rate/Rhythm: 115 - 156 ST   Pt agreeable to ambulate. Pt ambulated 457ft in hallway independently with steady gait. Pt denies CP, SOB, or dizziness. Pt returned to EOB. Reviewed site care, restrictions, and exercise guidelines. Pt denies further questions or concerns at this time. Grateful for the care she has received but very glad to be going home. Referred to [facility] [place].   [id number] Rufina Falco, RN BSN 05/11/2021 11:41 AM

## 2021-05-11 NOTE — Plan of Care (Signed)
°  Problem: Education: Goal: Knowledge of General Education information will improve Description: Including pain rating scale, medication(s)/side effects and non-pharmacologic comfort measures Outcome: Adequate for Discharge   Problem: Health Behavior/Discharge Planning: Goal: Ability to manage health-related needs will improve Outcome: Adequate for Discharge   Problem: Clinical Measurements: Goal: Ability to maintain clinical measurements within normal limits will improve Outcome: Adequate for Discharge Goal: Diagnostic test results will improve Outcome: Adequate for Discharge Goal: Cardiovascular complication will be avoided Outcome: Adequate for Discharge   Problem: Activity: Goal: Risk for activity intolerance will decrease Outcome: Adequate for Discharge   Problem: Nutrition: Goal: Adequate nutrition will be maintained Outcome: Adequate for Discharge   Problem: Coping: Goal: Level of anxiety will decrease Outcome: Adequate for Discharge   Problem: Elimination: Goal: Will not experience complications related to bowel motility Outcome: Adequate for Discharge   Problem: Pain Managment: Goal: General experience of comfort will improve Outcome: Adequate for Discharge   Problem: Safety: Goal: Ability to remain free from injury will improve Outcome: Adequate for Discharge   Problem: Skin Integrity: Goal: Risk for impaired skin integrity will decrease Outcome: Adequate for Discharge

## 2021-05-11 NOTE — Care Management Important Message (Signed)
Important Message  Patient Details  Name: Sheri Morgan MRN: [id number] Date of Birth: 1945-09-16   Medicare Important Message Given:  Yes     [name] 05/11/2021, 10:43 AM

## 2021-05-11 NOTE — Progress Notes (Addendum)
Patient ID: YIDES ARGENBRIGHT, adult   DOB: 10/28/1945, 76 y.o.   MRN: [id number]    Advanced Heart Failure Rounding Note   Subjective:    02/14: DBA stopped 02/16: Underwent DCCV AFL>>NSR 02/18: Recurrent AFL, restarted po amio 02/20: S/p dual chamber [employer] ICD placement 2/21 Mexiletine stopped and Amiodarone amio drip started.  2/24 TEE/DC-CV -> NSR  Remains in NSR On amio. Back-up pacing rate turned up to 60.   Last dose IV lasix 02/25. No UOP charted yesterday. Weight down 1 lb.   SCr 1.4-> 1.6-> 1.45. SBP 100s-120s.  Feels okay. No dyspnea. Has been up ambulating to bathroom.   On apixaban. HGb stable. Platelets 142>133>92   Objective:     Vital Signs:   Temp:  [97.7 F (36.5 C)-98.7 F (37.1 C)] 98.2 F (36.8 C) (02/27 0739) Pulse Rate:  [56-62] 62 (02/27 0739) Resp:  [18-19] 19 (02/27 0739) BP: (105-135)/(60-79) 105/61 (02/27 0739) SpO2:  [94 %-100 %] 100 % (02/27 0739) Weight:  [87 kg] 87 kg (02/27 0507) Last BM Date : 05/09/21  Weight change: Filed Weights   05/09/21 0627 05/10/21 0425 05/11/21 0507  Weight: 91.6 kg 87.4 kg 87 kg    Intake/Output:   Intake/Output Summary (Last 24 hours) at 05/11/2021 0833 Last data filed at 05/10/2021 1500 Gross per 24 hour  Intake 247.06 ml  Output --  Net 247.06 ml   Physical Exam:  General:  No distress. Sitting up on side of bed. HEENT: normal Neck: supple. no JVD. Carotids 2+ bilat; no bruits.  Cor: PMI nondisplaced. Regular rate & rhythm. No rubs, gallops or murmurs. ICD site stable. No hematoma. Lungs: clear Abdomen: soft, nontender, nondistended. No hepatosplenomegaly.  Extremities: no cyanosis, clubbing, rash, edema Neuro: alert & orientedx3, cranial nerves grossly intact. moves all 4 extremities w/o difficulty. Affect pleasant    Telemetry: A paced 60s, 2-5 PVCs/min (personally reviewed)    Labs: Basic Metabolic Panel: Recent Labs  Lab 05/07/21 0357 05/08/21 1114 05/08/21 1304  05/09/21 0434 05/10/21 0244 05/11/21 0342  NA 134* 139 139 139 139 138  K 4.6 5.9* 4.2 5.0 4.1 4.2  CL 103 105 104 106 104 104  CO2 23 24 25 25 27 27   GLUCOSE 111* 89 112* 80 99 91  BUN 26* 23 20 19 22  24*  CREATININE 1.74* 1.48* 1.48* 1.35* 1.62* 1.45*  CALCIUM 8.7* 9.1 9.0 8.9 8.9 9.0  MG 2.3  --   --  2.4  --   --     Liver Function Tests: No results for input(s): AST, ALT, ALKPHOS, BILITOT, PROT, ALBUMIN in the last 168 hours.  No results for input(s): LIPASE, AMYLASE in the last 168 hours. No results for input(s): AMMONIA in the last 168 hours.  CBC: Recent Labs  Lab 05/05/21 0459 05/08/21 1114 05/09/21 0434 05/10/21 0244 05/11/21 0342  WBC 10.1 10.1 9.9 8.0 8.6  HGB 13.7 13.8 12.0 12.0 11.9*  HCT 40.4 41.2 35.7* 35.8* 35.0*  MCV 90.0 91.2 89.9 89.3 89.5  PLT 133* PLATELET CLUMPS NOTED ON SMEAR, UNABLE TO ESTIMATE 94* 94* 92*    Cardiac Enzymes: No results for input(s): CKTOTAL, CKMB, CKMBINDEX, TROPONINI in the last 168 hours.  BNP: BNP (last 3 results) Recent Labs    04/23/21 2144  BNP 379.1*    ProBNP (last 3 results) No results for input(s): PROBNP in the last 8760 hours.    Other results:  Imaging: No results found.   Medications:  Scheduled Medications:  amiodarone  200 mg Oral Daily   apixaban  5 mg Oral BID   calcium citrate  200 mg of elemental calcium Oral BID   cholecalciferol  1,000 Units Oral Daily   dapagliflozin propanediol  10 mg Oral Daily   folic acid  1 mg Oral Daily   insulin aspart  0-9 Units Subcutaneous TID WC   levothyroxine  200 mcg Oral QAC breakfast   melatonin  3 mg Oral QHS   [START ON 05/14/2021] methotrexate  12.5 mg Oral Weekly   mexiletine  200 mg Oral BID   pantoprazole  40 mg Oral Daily   polyethylene glycol  17 g Oral Daily   predniSONE  30 mg Oral Q breakfast   rosuvastatin  40 mg Oral Daily   senna  1 tablet Oral Daily   sodium chloride flush  10-40 mL Intracatheter Q12H   sorbitol  30 mL Oral  Once   sulfamethoxazole-trimethoprim  1 tablet Oral Q M,W,F    Infusions:      PRN Medications: acetaminophen, naphazoline-glycerin, nitroGLYCERIN, ondansetron (ZOFRAN) IV, senna-docusate, sodium chloride flush   Assessment/Plan:   1.  Acute on chronic systolic HF due to NICM -> cardiogenic shock. Suspect cardiac sarcoid based on cardiac MRI.  - 5/22 ECHO EF 30-35% - Cath 2/23 minimal CAD. hstroponin 204 -> 379 - Echo 2/23: EF 20-25% - cMRI EF 11% RVEF 15% LGE pattern concerning for sarcoid. (D/w Dr. Bjorn Pippin)  - Only 2 small areas of sarcoid on cMRI. Suspect EF down mostly due to frequent PVCs. Should recover with suppression - Initially on DBA for support. Now off - Volume status improved after IV lasix 02/25. Scr up to 1.62, but now trending back down >>1.45. Will need PRN diuretic at discharge. - Continue Farxiga 10 - Off Sherryll Burger and Cleda Daub on hold w/ soft BP and AKI/hyperkalemia - Holding b-blocker with bradycardia/low output - Would like to add losartan 12.5 if K remains stable. Will wait until clinic   2. Cardiac sarcoidosis - based on cMRI and clinical course - ACE level normal (52 U/L). CT chest. No extra-cardiac sarcoid Immunosuppressive Therapy for Cardiac Sarcoid  Prednisone (04/29/21) Weeks 1-4: 30 mg daily  Methotrexate  (04/30/21 & 2/23/223) Weeks 1&2: 10 mg (four 2.5 mg tablets) once weekly  Weeks 3&4 starting 05/14/21 : 12.5 mg (five 2.5 mg tablets) once weekly Weeks 5&6: 15 mg (six 2.5 mg tablets) once weekly  Weeks 7&8: 17.5 mg (seven 2.5 mg tablets) once weekly Weeks 9+:  20 mg (eight 2.5 mg tablets ) once weekly  Additional Medications  Bactrim DS : 1 tablet Mon-Wed- Fri while taking >15 mg of prednisone daily  Folic Acid : 1 mg daily  Calcium Citrate -- switch to twice a day   Vitamin D: 1000 IU total daily  On protonix 40 mg daily while taking any dose of prednisone.      3. PVCs/NSVT/PMVT - EP following.  - did not tolerate amiodarone previously due  to worsening bradycardia - off lido due to n/v that said PVC burden much decreased with lido - mexiletine discontinued 2/21 per EP - S/p ICD 02/23 - On po amio 200 mg daily.  - Added back mexiletine 200 mg BID on 02/26 - PVC burden improved, down to 2-5/min - Keep K> 4.0 Mg > 2.0    4. Paroxysmal Atrial Flutter - S/p DCCV 2/16 -> back in AFL on 2/18.  - Continue Eliquis  - s/p TEE/DCCV 02/24  -  Continue amio  4. Hypokalemia/hyperkalemia - Stable today  5. AKI - SCr 1.47>>1.60>>1.74> 1.35 -> 1.6>1.45 - BP ok  - continue to hold Entresto and Arlyce Harman  - encouraged PO intake   6. Thrombocytopenia - Platelets 142>133>92 - No bleeding - May be due to MTX. Will need to follow closely    Can likely discharge home later today. Needs to ambulate halls this am.  Darrick Grinder NP-C  12:45 PM  Patient seen and examined with the above-signed Advanced Practice Provider and/or Housestaff. I personally reviewed laboratory data, imaging studies and relevant notes. I independently examined the patient and formulated the important aspects of the plan. I have edited the note to reflect any of my changes or salient points. I have personally discussed the plan with the patient and/or family.  Feels ok. PVCs reduced on mexilitene. Denies SOB, orthopnea or PND. SCr stable.   General:  Well appearing. No resp difficulty HEENT: normal Neck: supple. no JVD. Carotids 2+ bilat; no bruits. No lymphadenopathy or thryomegaly appreciated. Cor: PMI nondisplaced. Regular rate & rhythm. No rubs, gallops or murmurs. ICD site ok.  Lungs: clear Abdomen: soft, nontender, nondistended. No hepatosplenomegaly. No bruits or masses. Good bowel sounds. Extremities: no cyanosis, clubbing, rash, edema Neuro: alert & orientedx3, cranial nerves grossly intact. moves all 4 extremities w/o difficulty. Affect pleasant  OK for d/c today with close follow-up. Needs labs next week.   Glori Bickers, MD  1:21 PM

## 2021-05-11 NOTE — Progress Notes (Signed)
Pt ambulated well on a hallway without c/o SOB.  Idolina Primer, RN

## 2021-05-12 NOTE — TOC CM/SW Note (Signed)
05/13/2021 919 am HF TOC CM spoke to pt and states she received her DME and medications. States she is doing her daily weights and plans to start checking her blood pressure each day. She has a new blood pressure cuff. Her dtr has put her medications in a pill planner to help organize medications. Reviewed medications and potential side effects. Pt will continue to follow up with her MD if she has any questions. Isidoro Donning RN3 CCM, Heart Failure TOC CM [phone]

## 2021-05-13 ENCOUNTER — Encounter (HOSPITAL_COMMUNITY): Payer: Medicare Other

## 2021-05-14 ENCOUNTER — Ambulatory Visit (INDEPENDENT_AMBULATORY_CARE_PROVIDER_SITE_OTHER): Payer: Medicare Other

## 2021-05-14 ENCOUNTER — Other Ambulatory Visit: Payer: Self-pay

## 2021-05-14 DIAGNOSIS — I428 Other cardiomyopathies: Secondary | ICD-10-CM

## 2021-05-14 LAB — CUP PACEART INCLINIC DEVICE CHECK
Battery Remaining Longevity: 69 mo
Brady Statistic RA Percent Paced: 57 %
Brady Statistic RV Percent Paced: 25 %
Date Time Interrogation Session: 20230302125034
HighPow Impedance: 64.125
Implantable Lead Implant Date: 20230220
Implantable Lead Implant Date: 20230220
Implantable Lead Location: 753859
Implantable Lead Location: 753860
Implantable Lead Model: 7122
Implantable Pulse Generator Implant Date: 20230220
Lead Channel Impedance Value: 462.5 Ohm
Lead Channel Impedance Value: 487.5 Ohm
Lead Channel Pacing Threshold Amplitude: 0.5 V
Lead Channel Pacing Threshold Amplitude: 1 V
Lead Channel Pacing Threshold Pulse Width: 0.5 ms
Lead Channel Pacing Threshold Pulse Width: 0.5 ms
Lead Channel Sensing Intrinsic Amplitude: 11.2 mV
Lead Channel Sensing Intrinsic Amplitude: 3.2 mV
Lead Channel Setting Pacing Amplitude: 3.5 V
Lead Channel Setting Pacing Amplitude: 3.5 V
Lead Channel Setting Pacing Pulse Width: 0.5 ms
Lead Channel Setting Sensing Sensitivity: 0.5 mV
Pulse Gen Serial Number: 8937527

## 2021-05-14 NOTE — Patient Instructions (Signed)
? ?  After Your ICD ?(Implantable Cardiac Defibrillator) ? ? ? ?Monitor your defibrillator site for redness, swelling, and drainage. Call the device clinic at [phone] if you experience these symptoms or fever/chills. ? ?Your incision was closed with Steri-strips or staples:  You may shower and wash your incision with soap and water. Avoid lotions, ointments, or perfumes over your incision until it is well-healed. ? ?You may use a hot tub or a pool after your wound check appointment if the incision is completely closed. ? ?Do not lift, push or pull greater than 10 pounds with the affected arm until 6 weeks after your procedure. There are no other restrictions in arm movement after your wound check appointment. April 6 ? ?Your ICD is MRI compatible. ? ?Your ICD is designed to protect you from life threatening heart rhythms. Because of this, you may receive a shock.  ? ?1 shock with no symptoms:  Call the office during business hours. ?1 shock with symptoms (chest pain, chest pressure, dizziness, lightheadedness, shortness of breath, overall feeling unwell):  Call 911. ?If you experience 2 or more shocks in 24 hours:  Call 911. ?If you receive a shock, you should not drive.  ?[place] DMV - no driving for 6 months if you receive appropriate therapy from your ICD.  ? ?ICD Alerts:  Some alerts are vibratory and others beep. These are NOT emergencies. Please call our office to let us know. If this occurs at night or on weekends, it can wait until the next business day. Send a remote transmission. ? ?If your device is capable of reading fluid status (for heart failure), you will be offered monthly monitoring to review this with you.  ? ?Remote monitoring is used to monitor your ICD from home. This monitoring is scheduled every 91 days by our office. It allows us to keep an eye on the functioning of your device to ensure it is working properly. You will routinely see your Electrophysiologist annually (more often if  necessary).  ?

## 2021-05-14 NOTE — Progress Notes (Signed)
Wound check appointment. Steri-strips removed. Wound without redness or edema. Incision edges approximated, wound well healed. Normal device function. Thresholds, sensing, and impedances consistent with implant measurements. Device programmed at 3.5V for extra safety margin until 3 month visit. Histogram distribution appropriate for patient and level of activity. No ventricular arrhythmias noted. Known history of PAF. Burden 49% with longest episode since implant 1 day, 7 hours . Most recent AMS 05/13/21. +OAC.  Patient educated about wound care, arm mobility, lifting restrictions, shock plan. Patient enrolled in remote monitoring with next transmission 08/03/21. 91 day follow up with Dr. Ladona Ridgel 08/26/21.   ?

## 2021-05-18 ENCOUNTER — Other Ambulatory Visit (HOSPITAL_COMMUNITY): Payer: Self-pay

## 2021-05-19 ENCOUNTER — Other Ambulatory Visit (HOSPITAL_COMMUNITY): Payer: Self-pay

## 2021-05-19 NOTE — Progress Notes (Incomplete)
***In Progress***    Advanced Heart Failure Clinic Note   HPI:  Ms. Monico is a 76 year old obese female with a history of chronic systolic CHF with EF of [id number], paroxysmal atrial fibrillation not compliant with Eliquis prior to admission, PVCs, hypertension, and fibromyalgia. She was previously seen by Dr. Donata Clay in [place] and then established with Dr. Margaretann Loveless in 04/2020 for recurrent atrial fibrillation. Echo in 07/2020 showed LVEF of 30-35% which was new. Patient was lost to follow-up after this.   Patient was admitted on 04/23/2021 with chest pain and worsening shortness of breath. Labs were notable for high-sensitivity troponin of 204 >> 379 and BNP of 379.1. CTA was negative for aortic dissection. She underwent cardiac catheterization on 04/23/21 which showed mild non-obstructive CAD with only 30% stenosis of proximal LAD and elevated LVEDP. Echo showed LVEF of 20-25% with global hypokinesis, mildly enlarged RV with moderately reduced systolic function, mild to moderate MR, and moderately elevated RVSP of 54.0 mmHg.    Developed VT and was started on amiodarone but had more bradycardia >> PMVT (brady mediated). This was complicated by hypotension and cardiogenic shock requiring dobutamine which was weaned off on 04/28/21. EP was consulted and she was started on lidocaine with reduced PVC burden but this was later stopped due to nausea/vomiting. Subsequently, she was started on mexiletine but this was ultimately stopped as well. She then developed atrial flutter with RVR on 04/28/21. She underwent DCCV on 04/30/21 which was initially successful but then she had recurrent atrial flutter on 05/02/21. Due to bradycardia and need for amiodarone, she underwent placement of dual chamber ICD on 05/04/21. She was then loaded with IV amiodarone and underwent repeat TEE/DCCV on 05/08/21. No recurrent atrial arrhythmias prior to discharge. Kept on amiodarone for PVCs and mexiletine restarted when PVCs not fully  suppressed.   CMRI raised concern for cardiac sarcoidosis. Started on treatment per PharmD protocol (prednisone + methotrexate + Bactrim/folic acid/calcium citrate/vitaminD, Protonix). Discharge weight 191 lb.   Today she returns to HF clinic for pharmacist medication titration following hospital discharge on 05/11/21.   Overall feeling ***. Dizziness, lightheadedness, fatigue:  Chest pain or palpitations:  How is your breathing?: *** SOB: Able to complete all ADLs. Activity level ***  Weight at home pounds. Takes furosemide/torsemide/bumex *** mg *** daily.  LEE PND/Orthopnea  Appetite *** Low-salt diet:   Physical Exam Cost/affordability of meds   Cardiac Sarcoidosis Medications:  Prednisone 30 mg daily (through 05/26/21) Methotrexate 12.5 mg once weekly (05/14/21 & 05/21/21) Bactrim 1 DS tablet every MWF Folic acid 1 mg daily Pantoprazole 40 mg daily Vitamin D 1000 IU daily Calcium citrate 950 mg BID  HF Medications: Farxiga 10 mg daily Lasix 40 mg PRN  Has the patient been experiencing any side effects to the medications prescribed?  {YES NO:22349}  Does the patient have any problems obtaining medications due to transportation or finances?   {YES NO:22349}  Understanding of regimen: {excellent/good/fair/poor:19665} Understanding of indications: {excellent/good/fair/poor:19665} Potential of compliance: {excellent/good/fair/poor:19665} Patient understands to avoid NSAIDs. Patient understands to avoid decongestants.    Pertinent Lab Values: 05/11/21: Serum creatinine 1.45, BUN 24, Potassium 4.2, Sodium 138, Plt 92 05/01/21: AST 17, ALT 17  Vital Signs: Weight: *** (last clinic weight: *** - n/a?) Blood pressure: ***  Heart rate: ***   Assessment/Plan: 1.  Acute on chronic systolic HF due to NICM -> cardiogenic shock. Suspect cardiac sarcoid based on cardiac MRI.  - 07/2020 ECHO EF 30-35% - Cath 04/2021 minimal CAD. hstroponin  204 -> 379 - Echo 04/2021: EF 20-25% -  cMRI EF 11% RVEF 15% LGE pattern concerning for sarcoid. (D/w Dr. Gardiner Rhyme)  - Only 2 small areas of sarcoid on cMRI. Suspect EF down mostly due to frequent PVCs. Should recover with suppression - Initially on DBA for support. Now off - Diuresed with intermittent IV lasix. Last dose 02/25. Volume stable today. PRN lasix at discharge. - Continue Farxiga 10 mg daily - Off Entresto and spironolactone on hold w/ soft BP and AKI/hyperkalemia during hospitalization - Holding beta blocker with bradycardia/low output.  - Would like to add losartan 12.5 mg if K remains stable. Will wait until clinic.    2. Cardiac sarcoidosis - Based on cMRI and clinical course - ACE level normal (52 U/L). CT chest. No extra-cardiac sarcoid Immunosuppressive Therapy for Cardiac Sarcoid  Prednisone (04/29/21) Weeks 1-4: 30 mg daily  Methotrexate  (04/30/21 & 2/23/223) Weeks 1&2: 10 mg (four 2.5 mg tablets) once weekly Weeks 3&4 starting 05/14/21 : 12.5 mg (five 2.5 mg tablets) once weekly Weeks 5&6: 15 mg (six 2.5 mg tablets) once weekly  Weeks 7&8: 17.5 mg (seven 2.5 mg tablets) once weekly Weeks 9+:  20 mg (eight 2.5 mg tablets ) once weekly  Additional Medications  Bactrim DS : 1 tablet Mon-Wed- Fri while taking >15 mg of prednisone daily  Folic Acid : 1 mg daily  Calcium Citrate -- switch to twice a day   Vitamin D: 1000 IU total daily  On Protonix 40 mg daily while taking any dose of prednisone.      3. PVCs/NSVT/PMVT - EP following.  - Did not tolerate amiodarone previously due to worsening bradycardia - Off lido due to n/v that said PVC burden much decreased with lido - mexiletine discontinued 2/21 per EP - S/p ICD 02/23 - On po amiodarone 200 mg daily.  - Added back mexiletine 200 mg BID on 05/10/21 - PVC burden improved, down to 2-5/min - Keep K> 4.0 Mg > 2.0    4. Paroxysmal Atrial Flutter - S/p DCCV 04/30/21 -> back in AFL on 05/02/21.  - Continue Eliquis  - s/p TEE/DCCV t2/24/23  - Continue  amio   4. Hypokalemia/hyperkalemia - Stable today   5. AKI - SCr 1.47>>1.60>>1.74> 1.35 -> 1.6>1.45 - BP ok  - continue to hold Entresto and spironolactone  - encouraged PO intake   6. Thrombocytopenia - Platelets 142>133>92 - No bleeding - May be due to MTX. Will need to follow closely   Follow up 1 week with APP.   Audry Riles, PharmD, BCPS, BCCP, CPP Heart Failure Clinic Pharmacist [phone]

## 2021-05-20 ENCOUNTER — Other Ambulatory Visit: Payer: Self-pay

## 2021-05-20 ENCOUNTER — Encounter (HOSPITAL_COMMUNITY): Payer: Self-pay | Admitting: Radiology

## 2021-05-20 ENCOUNTER — Telehealth (HOSPITAL_COMMUNITY): Payer: Self-pay | Admitting: Pharmacist

## 2021-05-20 ENCOUNTER — Other Ambulatory Visit (HOSPITAL_COMMUNITY): Payer: Self-pay

## 2021-05-20 ENCOUNTER — Ambulatory Visit (HOSPITAL_COMMUNITY)
Admit: 2021-05-20 | Discharge: 2021-05-20 | Disposition: A | Discharge status: Home / Self Care | Payer: Medicare Other | Attending: Internal Medicine | Admitting: Internal Medicine

## 2021-05-20 VITALS — BP 116/84 | HR 61 | Wt 189.6 lb

## 2021-05-20 DIAGNOSIS — I493 Ventricular premature depolarization: Secondary | ICD-10-CM | POA: Insufficient documentation

## 2021-05-20 DIAGNOSIS — Z7901 Long term (current) use of anticoagulants: Secondary | ICD-10-CM | POA: Insufficient documentation

## 2021-05-20 DIAGNOSIS — E875 Hyperkalemia: Secondary | ICD-10-CM | POA: Diagnosis not present

## 2021-05-20 DIAGNOSIS — I110 Hypertensive heart disease with heart failure: Secondary | ICD-10-CM | POA: Insufficient documentation

## 2021-05-20 DIAGNOSIS — D869 Sarcoidosis, unspecified: Secondary | ICD-10-CM | POA: Insufficient documentation

## 2021-05-20 DIAGNOSIS — I480 Paroxysmal atrial fibrillation: Secondary | ICD-10-CM | POA: Insufficient documentation

## 2021-05-20 DIAGNOSIS — D696 Thrombocytopenia, unspecified: Secondary | ICD-10-CM | POA: Diagnosis not present

## 2021-05-20 DIAGNOSIS — N179 Acute kidney failure, unspecified: Secondary | ICD-10-CM | POA: Insufficient documentation

## 2021-05-20 DIAGNOSIS — I5023 Acute on chronic systolic (congestive) heart failure: Secondary | ICD-10-CM | POA: Insufficient documentation

## 2021-05-20 DIAGNOSIS — I428 Other cardiomyopathies: Secondary | ICD-10-CM | POA: Diagnosis not present

## 2021-05-20 DIAGNOSIS — I5022 Chronic systolic (congestive) heart failure: Secondary | ICD-10-CM | POA: Diagnosis not present

## 2021-05-20 DIAGNOSIS — I472 Ventricular tachycardia, unspecified: Secondary | ICD-10-CM | POA: Diagnosis not present

## 2021-05-20 DIAGNOSIS — E876 Hypokalemia: Secondary | ICD-10-CM | POA: Insufficient documentation

## 2021-05-20 MED ORDER — METHOTREXATE 2.5 MG PO TABS
ORAL_TABLET | ORAL | 1 refills | Status: DC
  Filled 2021-05-20: qty 25, fill #0
  Filled 2021-06-09: qty 25, 35d supply, fill #0

## 2021-05-20 MED ORDER — POTASSIUM CHLORIDE CRYS ER 20 MEQ PO TBCR
20.0000 meq | EXTENDED_RELEASE_TABLET | ORAL | 3 refills | Status: DC
  Filled 2021-05-20: qty 30, fill #0

## 2021-05-20 MED ORDER — DAPAGLIFLOZIN PROPANEDIOL 10 MG PO TABS
10.0000 mg | ORAL_TABLET | Freq: Every day | ORAL | 3 refills | Status: DC
  Filled 2021-05-20 – 2021-06-09 (×2): qty 30, 30d supply, fill #0

## 2021-05-20 MED ORDER — FUROSEMIDE 40 MG PO TABS
ORAL_TABLET | ORAL | 3 refills | Status: DC
  Filled 2021-05-20: qty 30, fill #0

## 2021-05-20 MED ORDER — SULFAMETHOXAZOLE-TRIMETHOPRIM 800-160 MG PO TABS
1.0000 | ORAL_TABLET | ORAL | 3 refills | Status: DC
  Filled 2021-05-20 – 2021-06-09 (×2): qty 12, 28d supply, fill #0

## 2021-05-20 MED ORDER — MEXILETINE HCL 200 MG PO CAPS
200.0000 mg | ORAL_CAPSULE | Freq: Two times a day (BID) | ORAL | 3 refills | Status: DC
  Filled 2021-05-20 – 2021-06-09 (×2): qty 60, 30d supply, fill #0

## 2021-05-20 MED ORDER — ROSUVASTATIN CALCIUM 40 MG PO TABS
40.0000 mg | ORAL_TABLET | Freq: Every day | ORAL | 3 refills | Status: DC
  Filled 2021-05-20 – 2021-06-09 (×2): qty 30, 30d supply, fill #0

## 2021-05-20 MED ORDER — AMIODARONE HCL 200 MG PO TABS
200.0000 mg | ORAL_TABLET | Freq: Every day | ORAL | 3 refills | Status: DC
  Filled 2021-05-20 – 2021-06-09 (×2): qty 30, 30d supply, fill #0

## 2021-05-20 MED ORDER — APIXABAN 5 MG PO TABS
5.0000 mg | ORAL_TABLET | Freq: Two times a day (BID) | ORAL | 3 refills | Status: DC
  Filled 2021-05-20: qty 60, 30d supply, fill #0
  Filled 2021-06-26: qty 60, 30d supply, fill #1

## 2021-05-20 MED ORDER — FOLIC ACID 1 MG PO TABS
1.0000 mg | ORAL_TABLET | Freq: Every day | ORAL | 3 refills | Status: DC
  Filled 2021-05-20: qty 30, 30d supply, fill #0
  Filled 2021-06-09: qty 90, 90d supply, fill #0

## 2021-05-20 MED ORDER — PANTOPRAZOLE SODIUM 40 MG PO TBEC
40.0000 mg | DELAYED_RELEASE_TABLET | Freq: Every day | ORAL | 3 refills | Status: DC
  Filled 2021-05-20 – 2021-06-09 (×2): qty 30, 30d supply, fill #0

## 2021-05-20 MED ORDER — PREDNISONE 10 MG PO TABS
30.0000 mg | ORAL_TABLET | Freq: Every day | ORAL | 6 refills | Status: DC
  Filled 2021-05-20 – 2021-06-18 (×2): qty 90, 30d supply, fill #0

## 2021-05-20 NOTE — Progress Notes (Signed)
?  ?Advanced Heart Failure Clinic Note  ? ?PCP: Dr. Margaretann Loveless ?HF Cardiologist: Dr. Haroldine Laws ? ?HPI:  ?Sheri Morgan is a 76 year old obese female with a history of chronic systolic CHF with EF of [id number], paroxysmal atrial fibrillation not compliant with Eliquis prior to recent admission, PVCs, hypertension, and fibromyalgia. She was previously seen by Dr. Donata Clay in [place] and then established with Dr. Margaretann Loveless in 04/2020 for recurrent atrial fibrillation. Echo in 07/2020 showed LVEF of 30-35% which was new. Patient was lost to follow-up after this. ?  ?Patient was admitted on 04/23/2021 with chest pain and worsening shortness of breath. Labs were notable for high-sensitivity troponin of 204 >> 379 and BNP of 379.1. CTA was negative for aortic dissection. She underwent cardiac catheterization on 04/23/21 which showed mild non-obstructive CAD with only 30% stenosis of proximal LAD and elevated LVEDP.  Echo showed LVEF of 20-25% with global hypokinesis, mildly enlarged RV with moderately reduced systolic function, mild to moderate MR, and moderately elevated RVSP of 54.0 mmHg.  ?  ?Developed VT and was started on amiodarone but had more bradycardia >> PMVT (brady mediated). This was complicated by hypotension and cardiogenic shock requiring dobutamine which was weaned off on 04/28/21. EP was consulted and she was started on lidocaine with reduced PVC burden but this was later stopped due to nausea/vomiting. Subsequently, she was started on mexiletine but this was ultimately stopped as well. She then developed atrial flutter with RVR on 04/28/21. She underwent DCCV on 04/30/21 which was initially successful but then she had recurrent atrial flutter on 05/02/21. Due to bradycardia and need for amiodarone, she underwent placement of dual chamber ICD on 05/04/21. She was then loaded with IV amiodarone and underwent repeat TEE/DCCV on 05/08/21. No recurrent atrial arrhythmias prior to discharge. Kept on amiodarone for PVCs and  mexiletine restarted when PVCs were not fully suppressed.  ? ?CMRI raised concern for cardiac sarcoidosis. Started on treatment per protocol (prednisone + methotrexate + Bactrim/folic acid/calcium citrate/vitaminD, Protonix). Discharge weight was 191 lbs.  ? ?Today she returns to HF clinic for pharmacist medication titration following hospital discharge on 05/11/21. Reports she is doing well since her discharge from the hospital, getting more energy back every day. Has not needed to use any PRN Lasix. She is taking all medications as prescribed except methotrexate. She was taking one tablet daily after discharge until last Friday when she found out from the pharmacy she was taking it incorrectly and stopped it altogether until she had this appointment. She asks to have all her medications switched to [facility].  ? ?Cardiac Sarcoidosis Medications (see telephone note from 05/20/21 for full titration schedule):  ?Prednisone 30 mg daily (through 05/26/21) ?Methotrexate 12.5 mg once weekly - not currently taking ?Bactrim 1 DS tablet every MWF ?Folic acid 1 mg daily ?Pantoprazole 40 mg daily ?Vitamin D 1000 IU daily ?Calcium citrate 950 mg BID ? ?HF Medications: ?Farxiga 10 mg daily ?Lasix 40 mg PRN ?Potassium chloride 20 mEq PRN when taking Lasix ? ?Has the patient been experiencing any side effects to the medications prescribed?  No ? ?Does the patient have any problems obtaining medications due to transportation or finances?   [place] will be $37 (used 30 day free card for first fill).  ? ?Understanding of regimen: good ?Understanding of indications: good ?Potential of compliance: good ?Patient understands to avoid NSAIDs. ?Patient understands to avoid decongestants. ? ?Pertinent Lab Values: ?05/11/21: Serum creatinine 1.45, BUN 24, Potassium  4.2, Sodium 138, Platelets 92 ?05/01/21: AST 17, ALT 17 ?CMET and CBC/DIFF ordered today but labs were unable to be drawn.  ? ?Vital  Signs: ?Weight: 189.6 lbs (discharge weight 191 lbs) ?Blood pressure: 116/84  ?Heart rate: 61  ? ?Assessment/Plan: ?1.  Acute on chronic systolic HF due to NICM -> cardiogenic shock. Suspect cardiac sarcoid based on cardiac MRI.  ?- 07/2020 ECHO EF 30-35% ?- Cath 04/2021 minimal CAD. hstroponin 204 -> 379 ?- Echo 04/2021: EF 20-25% ?- cMRI EF 11% RVEF 15% LGE pattern concerning for sarcoid. (D/w Dr. Gardiner Rhyme)  ?- Only 2 small areas of sarcoid on cMRI. Suspect EF down mostly due to frequent PVCs. Should recover with suppression.  ?- NYHA II. Euvolemic on exam today. She has not needed any Lasix since discharge.  ?- Continue Lasix 40 mg PRN  ?- Holding beta blocker with bradycardia/low output.  ?- Off Entresto and spironolactone on hold w/ soft BP and AKI/hyperkalemia during hospitalization. Consider starting ARB/ARNI at next clinic visit if she remains stable.  ?- Continue Farxiga 10 mg daily ?- Attempted to get labs today but was unsuccessful. Will get CMET and CBC/diff at clinic visit next week.  ? ?2. Cardiac sarcoidosis ?- Based on cMRI and clinical course ?- ACE level normal (52 U/L). CT chest. No extra-cardiac sarcoid ?- She received weeks 1 & 2 of methotrexate in the hospital. Patient was taking incorrectly after discharge. Will resume titration today at week 3 dose. Educated and provided patient with titration protocol.  ?Immunosuppressive Therapy for Cardiac Sarcoid: See telephone note from 05/20/21 for full prednisone and methotrexate titration protocol.  ?- Continue prednisone 30 mg daily (04/29/21 through 05/26/21) then per titration protocol ?- Restart methotrexate today at 12.5 mg (five 2.5 mg tablets) once weekly (05/20/21 & 05/27/21) then per titration protocol ?- Continue Bactrim DS 1 tablet Mon-Wed- Fri while taking >15 mg of prednisone daily  ?- Continue folic acid 1 mg daily  ?- Continue pantoprazole 40 mg daily while taking any dose of prednisone.  ?- Continue calcium citrate 950 mg BID and vitamin D 1000  IU daily  ?- Needs monthly CMET and CBC with diff for first three months. Lab was unable to draw labs today. Will check at visit next week.  ? ?3. PVCs/NSVT/PMVT ?- EP following.  ?- S/p ICD 04/2021 ?- Continue mexiletine 200 mg BID and amiodarone 200 mg daily.  ? ?4. Paroxysmal Atrial Flutter ?- S/p DCCV 04/30/21 -> back in AFL on 05/02/21.  ?- Continue Eliquis 5 mg BID ?- s/p TEE/DCCV 05/08/21  ?- Continue amiodarone 200 mg daily ? ?4. Hypokalemia/hyperkalemia ?- CMET at visit next week ? ?5. AKI ?- Noted during hospitalization. Serum creatinine 1.45 on discharge.  ?- BP ok today ?- Unable to check labs today. CMET next week.  ? ?6. Thrombocytopenia ?- Platelets trended down during hospitalization: 142>133>92 ?- No bleeding ?- May be due to methotrexate. Will need to follow closely.  ?- Unable to check labs today. CBC with diff next week.  ? ?Follow up 1 week with APP.  ? ?Audry Riles, PharmD, BCPS, BCCP, CPP ?Heart Failure Clinic Pharmacist ?[phone] ? ?

## 2021-05-20 NOTE — Patient Instructions (Addendum)
It was a pleasure seeing you today! ? ?MEDICATIONS: ?-We are changing your medications today ?-Follow protocol attached.  ?-Call if you have questions about your medications. ? ?LABS: ?-We will get labs at your appointment next week. Make sure you are hydrated for that visit.  ? ?NEXT APPOINTMENT: ?Return to clinic in 1 week with NP. ? ?In general, to take care of your heart failure: ?-Limit your fluid intake to 2 Liters (half-gallon) per day.   ?-Limit your salt intake to ideally 2-3 grams (2000-3000 mg) per day. ?-Weigh yourself daily and record, and bring that "weight diary" to your next appointment.  (Weight gain of 2-3 pounds in 1 day typically means fluid weight.) ?-The medications for your heart are to help your heart and help you live longer.   ?-Please contact us before stopping any of your heart medications. ? ?Call the clinic at [phone] with questions or to reschedule future appointments. ? ?

## 2021-05-20 NOTE — Telephone Encounter (Signed)
Cardiac Sarcoidosis Treatment Protocol Patient Handout  ?Start Date 04/29/21  ? ?Prednisone   ?Weeks 1-4  04/29/21 through 05/26/21  30 mg (three 10 mg tablets) daily   ?Weeks 5-8  05/27/21 through 06/23/21  25 mg (two and a half 10 mg tablets) daily   ?Weeks 9-12  06/24/21 through 07/21/21  20 mg (two 10 mg tablets) daily   ?Weeks 13-16  07/22/21 through 08/18/21  15 mg (one and a half 10 mg tablets) daily   ?Weeks 17-18  08/19/21 through 09/01/21  10 mg (two 5 mg tablets) daily  ?Stop taking Bactrim on 08/19/21    ?Weeks 19-20  09/02/21 through 09/15/21  7.5 mg (one and a half 5 mg tablets) daily   ?Weeks 21-21  09/16/21 through 09/29/21  5 mg (one 5 mg tablet) daily   ?Weeks 23-24  09/30/21 through 10/13/21  2.5 mg (half of a 5 mg tablet) daily   ?Week 25  10/14/21  Stop prednisone   ? ? ? ?Methotrexate   ?Weeks 1-2  04/30/21 & 05/07/21  10 mg (four 2.5 mg tablets) once weekly   ?*Patient missed originally scheduled increase on 05/14/21. Restarted titration as below ?Weeks 3-4  05/20/21 & 05/27/21  12.5 mg (five 2.5 mg tablets) once weekly   ?Weeks 5-6  06/03/21 & 06/10/21  15 mg (six 2.5 mg tablets) once weekly   ?Weeks 7-8  06/17/21 & 06/24/21  17.5 mg (seven 2.5 mg tablets) once weekly   ?Weeks 9+  07/01/21 & thereafter 20 mg (eight 2.5 mg tablets) once weekly   ? ? ?Audry Riles, PharmD, BCPS, BCCP, CPP ?Heart Failure Clinic Pharmacist ?[phone] ? ? ?

## 2021-05-22 ENCOUNTER — Other Ambulatory Visit: Payer: Self-pay | Admitting: Internal Medicine

## 2021-05-26 ENCOUNTER — Other Ambulatory Visit (HOSPITAL_COMMUNITY): Payer: Self-pay

## 2021-05-26 ENCOUNTER — Other Ambulatory Visit: Payer: Self-pay | Admitting: Internal Medicine

## 2021-05-27 ENCOUNTER — Other Ambulatory Visit (HOSPITAL_COMMUNITY): Payer: Self-pay

## 2021-05-27 ENCOUNTER — Encounter (HOSPITAL_COMMUNITY): Payer: Self-pay

## 2021-05-27 ENCOUNTER — Ambulatory Visit (HOSPITAL_COMMUNITY)
Admit: 2021-05-27 | Discharge: 2021-05-27 | Disposition: A | Discharge status: Home / Self Care | Payer: Medicare Other | Attending: Family Medicine | Admitting: Family Medicine

## 2021-05-27 ENCOUNTER — Other Ambulatory Visit: Payer: Self-pay

## 2021-05-27 VITALS — BP 142/78 | HR 78 | Wt 189.6 lb

## 2021-05-27 DIAGNOSIS — I472 Ventricular tachycardia, unspecified: Secondary | ICD-10-CM | POA: Diagnosis not present

## 2021-05-27 DIAGNOSIS — I110 Hypertensive heart disease with heart failure: Secondary | ICD-10-CM | POA: Insufficient documentation

## 2021-05-27 DIAGNOSIS — I4892 Unspecified atrial flutter: Secondary | ICD-10-CM

## 2021-05-27 DIAGNOSIS — R7989 Other specified abnormal findings of blood chemistry: Secondary | ICD-10-CM

## 2021-05-27 DIAGNOSIS — Z7901 Long term (current) use of anticoagulants: Secondary | ICD-10-CM | POA: Insufficient documentation

## 2021-05-27 DIAGNOSIS — I480 Paroxysmal atrial fibrillation: Secondary | ICD-10-CM | POA: Insufficient documentation

## 2021-05-27 DIAGNOSIS — Z7952 Long term (current) use of systemic steroids: Secondary | ICD-10-CM | POA: Insufficient documentation

## 2021-05-27 DIAGNOSIS — M797 Fibromyalgia: Secondary | ICD-10-CM | POA: Diagnosis not present

## 2021-05-27 DIAGNOSIS — I2510 Atherosclerotic heart disease of native coronary artery without angina pectoris: Secondary | ICD-10-CM | POA: Insufficient documentation

## 2021-05-27 DIAGNOSIS — I428 Other cardiomyopathies: Secondary | ICD-10-CM | POA: Insufficient documentation

## 2021-05-27 DIAGNOSIS — D8685 Sarcoid myocarditis: Secondary | ICD-10-CM | POA: Diagnosis not present

## 2021-05-27 DIAGNOSIS — D696 Thrombocytopenia, unspecified: Secondary | ICD-10-CM | POA: Diagnosis not present

## 2021-05-27 DIAGNOSIS — I493 Ventricular premature depolarization: Secondary | ICD-10-CM

## 2021-05-27 DIAGNOSIS — L600 Ingrowing nail: Secondary | ICD-10-CM | POA: Insufficient documentation

## 2021-05-27 DIAGNOSIS — Z79899 Other long term (current) drug therapy: Secondary | ICD-10-CM | POA: Insufficient documentation

## 2021-05-27 DIAGNOSIS — I5022 Chronic systolic (congestive) heart failure: Secondary | ICD-10-CM | POA: Diagnosis not present

## 2021-05-27 LAB — COMPREHENSIVE METABOLIC PANEL
ALT: 22 U/L (ref 0–44)
AST: 20 U/L (ref 15–41)
Albumin: 3.7 g/dL (ref 3.5–5.0)
Alkaline Phosphatase: 89 U/L (ref 38–126)
Anion gap: 10 (ref 5–15)
BUN: 26 mg/dL — ABNORMAL HIGH (ref 8–23)
CO2: 22 mmol/L (ref 22–32)
Calcium: 9.3 mg/dL (ref 8.9–10.3)
Chloride: 102 mmol/L (ref 98–111)
Creatinine, Ser: 1.4 mg/dL — ABNORMAL HIGH (ref 0.44–1.00)
GFR, Estimated: 39 mL/min — ABNORMAL LOW (ref >60)
Glucose, Bld: 91 mg/dL (ref 70–99)
Potassium: 4.2 mmol/L (ref 3.5–5.1)
Sodium: 134 mmol/L — ABNORMAL LOW (ref 135–145)
Total Bilirubin: 0.5 mg/dL (ref 0.3–1.2)
Total Protein: 6.9 g/dL (ref 6.5–8.1)

## 2021-05-27 LAB — CBC WITH DIFFERENTIAL/PLATELET
Abs Immature Granulocytes: 0.09 10*3/uL — ABNORMAL HIGH (ref 0.00–0.07)
Basophils Absolute: 0 10*3/uL (ref 0.0–0.1)
Basophils Relative: 0 %
Eosinophils Absolute: 0 10*3/uL (ref 0.0–0.5)
Eosinophils Relative: 0 %
HCT: 42.3 % (ref 36.0–46.0)
Hemoglobin: 13.9 g/dL (ref 12.0–15.0)
Immature Granulocytes: 1 %
Lymphocytes Relative: 13 %
Lymphs Abs: 0.9 10*3/uL (ref 0.7–4.0)
MCH: 30.3 pg (ref 26.0–34.0)
MCHC: 32.9 g/dL (ref 30.0–36.0)
MCV: 92.4 fL (ref 80.0–100.0)
Monocytes Absolute: 0.6 10*3/uL (ref 0.1–1.0)
Monocytes Relative: 9 %
Neutro Abs: 5.3 10*3/uL (ref 1.7–7.7)
Neutrophils Relative %: 77 %
Platelets: 113 10*3/uL — ABNORMAL LOW (ref 150–400)
RBC: 4.58 MIL/uL (ref 3.87–5.11)
RDW: 17.4 % — ABNORMAL HIGH (ref 11.5–15.5)
WBC: 6.9 10*3/uL (ref 4.0–10.5)
nRBC: 0 % (ref 0.0–0.2)

## 2021-05-27 MED ORDER — LOSARTAN POTASSIUM 25 MG PO TABS
12.5000 mg | ORAL_TABLET | Freq: Every day | ORAL | 0 refills | Status: DC
  Filled 2021-05-27: qty 45, 90d supply, fill #0

## 2021-05-27 NOTE — Progress Notes (Signed)
? ?ADVANCED HF CLINIC CONSULT NOTE ? ? ?Primary Care: Janith Lima, MD ?HF Cardiologist: Dr. Haroldine Laws ? ?HPI: ?Sheri Morgan is a 76 y.o. obese woman with fibromyalgia, PAF, PVCs, HTN and systolic HF ?  ?Previously followed by Dr. Donata Clay in [place] Saw Dr. Delsa Bern in 2/22 with recurrent AF. Echo 5/22 with newly discovered LV dysfunction EF 30-35%. ?  ?No f/u here since that time. ?  ?Admitted 2/23 with CP and worsening SOB. CT negative for dissection. Underwent cath showing only 30% stenosis of proximal LAD and elevated LVEDP EF 20-25%. ? Possible takotsubo.  Echo 20-25% moderate MR. On way to cath developed VT. Started on amiodarone developed more bradycardia -> PMVT. She had subsequent hypotension and cardiogenic shock requiring DBA gtt. EP consulted and lidocaine gtt started with reduced PVC burden. [place]ido gtt weaned off and mexiletine started but was stopped. She developed AFL with RVR and underwent DCCV. Initially successful but then had recurrent AFL. Due to bradycardia and need for amiodarone, underwent placement of dual chamber ICD. Then loaded with IV amio and underwent successful TEE/DCCV. She remained on amio for PVCs and mexiletine restarted when PVCs not fully suppressed. cMRI suspicious for cardiac sarcoid and started on treatment protocol. GDMT limited by elevated SCr, discharged home, weight 191 lbs. ? ?Today she returns for post hospital HF follow up with her daughter Overall feeling fine. She is mildly weak and is SOB if she pushes herself. Has not been physically active since discharge. Has occasional dizziness but no falls. Denies palpitations, CP, abnormal bleeding edema, or PND/Orthopnea. Appetite ok. No fever or chills. Weight at home 186 pounds. Taking all medications. No hand/finger numbness. Stubbed right great toe in hospital and having pain at site. ? ?Cardiac Studies: ?- Echo (2/23): LVEF of 20-25% with global hypokinesis, mildly enlarged RV with moderately reduced systolic function, mild  to moderate MR, and moderately elevated RVSP of 54.0 mmHg.  ? ?- LHC (2/23): 30% stenosis of pLAD, elevated LVEDP ? ?- cMRI (2/23): LVEF 11%, RVEF 55%, apical LGE, no LV thrombus, pattern concerning for cardiac sarcoid. ? ?Review of Systems: [y] = yes, [ ]  = no  ? ?General: Weight gain [ ] ; Weight loss [ ] ; Anorexia [ ] ; Fatigue Blue.Reese ]; Fever [ ] ; Chills [ ] ; Weakness Blue.Reese ]  ?Cardiac: Chest pain/pressure [ ] ; Resting SOB [ ] ; Exertional SOB [y]; Orthopnea [ ] ; Pedal Edema [ ] ; Palpitations [ ] ; Syncope [ ] ; Presyncope [ ] ; Paroxysmal nocturnal dyspnea[ ]   ?Pulmonary: Cough [ ] ; Wheezing[ ] ; Hemoptysis[ ] ; Sputum [ ] ; Snoring [ ]   ?GI: Vomiting[ ] ; Dysphagia[ ] ; Melena[ ] ; Hematochezia [ ] ; Heartburn[ ] ; Abdominal pain [ ] ; Constipation [ ] ; Diarrhea [ ] ; BRBPR [ ]   ?GU: Hematuria[ ] ; Dysuria [ ] ; Nocturia[ ]   ?Vascular: Pain in legs with walking [ ] ; Pain in feet with lying flat [ ] ; Non-healing sores [ ] ; Stroke [ ] ; TIA [ ] ; Slurred speech [ ] ;  ?Neuro: Headaches[ ] ; Vertigo[ ] ; Seizures[ ] ; Paresthesias[ ] ;Blurred vision [ ] ; Diplopia [ ] ; Vision changes [ ]   ?Ortho/Skin: Arthritis [ ] ; Joint pain [ ] ; Muscle pain [ ] ; Joint swelling [ ] ; Back Pain [ ] ; Rash [ ]   ?Psych: Depression[ ] ; Anxiety[ ]   ?Heme: Bleeding problems [ ] ; Clotting disorders [ ] ; Anemia [ ]   ?Endocrine: Diabetes [ ] ; Thyroid dysfunction[y ] ? ?Past Medical History:  ?Diagnosis Date  ? Anxiety   ? Atypical atrial flutter ([place])   ? Cardiac  sarcoidosis   ? Chronic combined systolic and diastolic CHF (congestive heart failure) ([place])   ? Fibromyalgia   ? Hypertension   ? Non-ischemic cardiomyopathy ([place])   ? LHC 04/23/2021: 30% stenosis of proximal LAD  ? Paroxysmal atrial fibrillation ([facility])   ? Premature atrial contractions   ? Premature ventricular contraction   ? S/P ICD (internal cardiac defibrillator) procedure   ? Tachy-brady syndrome ([place])   ? VT (ventricular tachycardia)   ? ?Current Outpatient Medications  ?Medication Sig Dispense Refill  ?  amiodarone (PACERONE) 200 MG tablet Take 1 tablet (200 mg total) by mouth daily. 30 tablet 3  ? apixaban (ELIQUIS) 5 MG TABS tablet Take 1 tablet (5 mg total) by mouth 2 (two) times daily. 60 tablet 3  ? calcium citrate (CALCITRATE - DOSED IN MG ELEMENTAL CALCIUM) 950 (200 Ca) MG tablet Take 1 tablet (200 mg of elemental calcium total) by mouth 2 (two) times daily. 30 tablet 6  ? dapagliflozin propanediol (FARXIGA) 10 MG TABS tablet Take 1 tablet (10 mg total) by mouth daily. 30 tablet 3  ? folic acid (FOLVITE) 1 MG tablet Take 1 tablet (1 mg total) by mouth daily. 30 tablet 3  ? levothyroxine (SYNTHROID) 200 MCG tablet Take 1 tablet (200 mcg total) by mouth daily before breakfast. 90 tablet 0  ? methotrexate (RHEUMATREX) 2.5 MG tablet Take 12.5 mg (5 tablets) once a week for 2 weeks starting 03/08 then as directed by HF clinic 25 tablet 1  ? mexiletine (MEXITIL) 200 MG capsule Take 1 capsule (200 mg total) by mouth 2 (two) times daily. 60 capsule 3  ? pantoprazole (PROTONIX) 40 MG tablet Take 1 tablet (40 mg total) by mouth daily. 30 tablet 3  ? prednisoLONE acetate (PRED FORTE) 1 % ophthalmic suspension Place 1 drop into the left eye every evening.    ? predniSONE (DELTASONE) 10 MG tablet Take 3 tablets (30 mg total) by mouth daily with breakfast. 90 tablet 6  ? rosuvastatin (CRESTOR) 40 MG tablet Take 1 tablet (40 mg total) by mouth daily. 30 tablet 3  ? sulfamethoxazole-trimethoprim (BACTRIM DS) 800-160 MG tablet Take 1 tablet by mouth every Monday, Wednesday, and Friday. 12 tablet 3  ? Vitamin D3 (VITAMIN D) 25 MCG tablet Take 1 tablet (1,000 Units total) by mouth daily. 30 tablet 6  ? furosemide (LASIX) 40 MG tablet Take 1 tablet as needed for weight gain > 3 lb in a day or 5 lb in a week, leg edema, shortness of breath (Patient not taking: Reported on 05/27/2021) 30 tablet 3  ? potassium chloride SA (KLOR-CON M) 20 MEQ tablet Take 1 tablet (20 mEq total) by mouth when taking furosemide (Patient not taking:  Reported on 05/27/2021) 30 tablet 3  ? ?No current facility-administered medications for this encounter.  ? ?Allergies  ?Allergen Reactions  ? Codeine Itching  ? ?Social History  ? ?Socioeconomic History  ? Marital status: Divorced  ?  Spouse name: Not on file  ? Number of children: Not on file  ? Years of education: Not on file  ? Highest education level: Not on file  ?Occupational History  ? Not on file  ?Tobacco Use  ? Smoking status: Never  ? Smokeless tobacco: Never  ?Vaping Use  ? Vaping Use: Never used  ?Substance and Sexual Activity  ? Alcohol use: No  ? Drug use: No  ? Sexual activity: Never  ?Other Topics Concern  ? Not on file  ?Social History Narrative  ?  Retired from [employer]  ? ?Social Determinants of Health  ? ?Financial Resource Strain: Low Risk   ? Difficulty of Paying Living Expenses: Not hard at all  ?Food Insecurity: No Food Insecurity  ? Worried About Charity fundraiser in the Last Year: Never true  ? Ran Out of Food in the Last Year: Never true  ?Transportation Needs: No Transportation Needs  ? Lack of Transportation (Medical): No  ? Lack of Transportation (Non-Medical): No  ?Physical Activity: Insufficiently Active  ? Days of Exercise per Week: 1 day  ? Minutes of Exercise per Session: 30 min  ?Stress: No Stress Concern Present  ? Feeling of Stress : Not at all  ?Social Connections: Moderately Integrated  ? Frequency of Communication with Friends and Family: More than three times a week  ? Frequency of Social Gatherings with Friends and Family: Not on file  ? Attends Religious Services: More than 4 times per year  ? Active Member of Clubs or Organizations: Yes  ? Attends Archivist Meetings: Never  ? Marital Status: Divorced  ?Intimate Partner Violence: Not At Risk  ? Fear of Current or Ex-Partner: No  ? Emotionally Abused: No  ? Physically Abused: No  ? Sexually Abused: No  ? ?Family History  ?Problem Relation Age of Onset  ? Diabetes Mother   ? Alcoholism Father   ? ?BP (!) 142/78    Pulse 78   Wt 86 kg (189 lb 9.6 oz)   SpO2 98%   BMI 33.59 kg/m?  ? ?Wt Readings from Last 3 Encounters:  ?05/27/21 86 kg (189 lb 9.6 oz)  ?05/20/21 86 kg (189 lb 9.6 oz)  ?05/11/21 87 kg (191 lb 12.8 oz)  ?

## 2021-05-27 NOTE — Patient Instructions (Signed)
Thank you for coming in today ? ?Labs were done today, if any labs are abnormal the clinic will call you ? ?START Losartan 12.5 mg daily  ? ?Your physician recommends that you return for lab work in:  labs in 7-10 days ? ?Your physician recommends that you schedule a follow-up appointment in:  ?4 and 8 weeks in pharmacy  ?2 months in clinic ? ?CHECK blood pressure at home and record and bring to next visits ? ?You were given a handicap form ? ?Please use information given to find a primary care physician ? ?At the [facility], you and your health needs are our priority. As part of our continuing mission to provide you with exceptional heart care, we have created designated Provider Care Teams. These Care Teams include your primary Cardiologist (physician) and Advanced Practice Providers (APPs- Physician Assistants and Nurse Practitioners) who all work together to provide you with the care you need, when you need it.  ? ?You may see any of the following providers on your designated Care Team at your next follow up: ?Dr Arvilla Meres ?Dr Marca Ancona ?Tonye Becket, NP ?Robbie Lis, PA ?Jessica Milford,NP ?Anna Genre, PA ?Karle Plumber, PharmD ? ? ?Please be sure to bring in all your medications bottles to every appointment.  ? ?If you have any questions or concerns before your next appointment please send [place] a message through [place] or call our office at [phone].   ? ?TO LEAVE A MESSAGE FOR THE NURSE SELECT OPTION 2, PLEASE LEAVE A MESSAGE INCLUDING: ?YOUR NAME ?DATE OF BIRTH ?CALL BACK NUMBER ?REASON FOR CALL**this is important as we prioritize the call backs ? ?YOU WILL RECEIVE A CALL BACK THE SAME DAY AS LONG AS YOU CALL BEFORE 4:00 PM ? ?

## 2021-06-03 ENCOUNTER — Ambulatory Visit (HOSPITAL_COMMUNITY)
Admission: RE | Admit: 2021-06-03 | Discharge: 2021-06-03 | Disposition: A | Discharge status: Home / Self Care | Payer: Medicare Other | Source: Ambulatory Visit | Attending: Cardiology | Admitting: Cardiology

## 2021-06-03 ENCOUNTER — Other Ambulatory Visit: Payer: Self-pay

## 2021-06-03 DIAGNOSIS — I5022 Chronic systolic (congestive) heart failure: Secondary | ICD-10-CM | POA: Diagnosis not present

## 2021-06-03 LAB — BASIC METABOLIC PANEL
Anion gap: 11 (ref 5–15)
BUN: 23 mg/dL (ref 8–23)
CO2: 24 mmol/L (ref 22–32)
Calcium: 9.3 mg/dL (ref 8.9–10.3)
Chloride: 106 mmol/L (ref 98–111)
Creatinine, Ser: 1.5 mg/dL — ABNORMAL HIGH (ref 0.44–1.00)
GFR, Estimated: 36 mL/min — ABNORMAL LOW (ref >60)
Glucose, Bld: 84 mg/dL (ref 70–99)
Potassium: 3.7 mmol/L (ref 3.5–5.1)
Sodium: 141 mmol/L (ref 135–145)

## 2021-06-09 ENCOUNTER — Other Ambulatory Visit (HOSPITAL_COMMUNITY): Payer: Self-pay

## 2021-06-10 ENCOUNTER — Other Ambulatory Visit (HOSPITAL_COMMUNITY): Payer: Self-pay

## 2021-06-15 ENCOUNTER — Ambulatory Visit: Payer: Medicare Other | Admitting: Podiatrist

## 2021-06-18 ENCOUNTER — Other Ambulatory Visit (HOSPITAL_COMMUNITY): Payer: Self-pay

## 2021-06-26 ENCOUNTER — Other Ambulatory Visit (HOSPITAL_COMMUNITY): Payer: Self-pay

## 2021-06-29 ENCOUNTER — Ambulatory Visit: Payer: Medicare Other | Admitting: Podiatrist

## 2021-07-02 ENCOUNTER — Other Ambulatory Visit (HOSPITAL_COMMUNITY): Payer: Self-pay

## 2021-07-03 NOTE — Progress Notes (Incomplete)
***In Progress*** ? ?  ?Advanced Heart Failure Clinic Note  ? ?Primary Care: Janith Lima, MD ?HF Cardiologist: Dr. Haroldine Laws ?  ?HPI: ?Sheri Morgan is a 76 y.o. obese woman with fibromyalgia, PAF, PVCs, HTN and systolic HF ?  ?Previously followed by Dr. Donata Clay in [place] Saw Dr. Delsa Bern in 04/2020 with recurrent AF. Echo 07/2020 with newly discovered LV dysfunction EF 30-35%. ?  ?No f/u here since that time. ?  ?Admitted 04/2021 with CP and worsening SOB. CT negative for dissection. Underwent cath showing only 30% stenosis of proximal LAD and elevated LVEDP EF 20-25%. ? Possible takotsubo.  Echo 20-25% moderate MR. On way to cath developed VT. Started on amiodarone developed more bradycardia -> PMVT. She had subsequent hypotension and cardiogenic shock requiring DBA gtt. EP consulted and lidocaine gtt started with reduced PVC burden. Lido gtt weaned off and mexiletine started but was stopped. She developed AFL with RVR and underwent DCCV. Initially successful but then had recurrent AFL. Due to bradycardia and need for amiodarone, underwent placement of dual chamber ICD. Then loaded with IV amio and underwent successful TEE/DCCV. She remained on amio for PVCs and mexiletine restarted when PVCs not fully suppressed. cMRI suspicious for cardiac sarcoid and started on treatment protocol. GDMT limited by elevated SCr, discharged home, weight 191 lbs. ?  ?She returned for post hospital HF follow up with her daughter on 05/27/2021. Overall she was feeling fine. She felt mildly weak and SOB if she pushed herself and had not been physically active since discharge. She was having occasional dizziness but no falls. Denied palpitations, CP, abnormal bleeding edema, or PND/Orthopnea. Appetite was ok and  weight at home had been 186 pounds. She was taking all her medications. Denied hand/finger numbness but had a stubbed right great toe in hospital and was having pain at site. ?  ?Cardiac Studies: ?- Echo (04/2021): LVEF of 20-25%  with global hypokinesis, mildly enlarged RV with moderately reduced systolic function, mild to moderate MR, and moderately elevated RVSP of 54.0 mmHg.  ?  ?- LHC (04/2021): 30% stenosis of pLAD, elevated LVEDP ?  ?- cMRI (04/2021): LVEF 11%, RVEF 55%, apical LGE, no LV thrombus, pattern concerning for cardiac sarcoid. ? ?Today she returns to HF clinic for pharmacist medication titration. At last visit with NP she was started on losartan 12.5 mg daily.  ? ?Overall feeling ***. ?Dizziness, lightheadedness, fatigue:  ?Chest pain or palpitations: ? ?How is your breathing?: *** ?SOB: ?Able to complete all ADLs. Activity level *** ? ?Weight at home pounds. Takes furosemide 40 mg PRN. *** last use ?LEE ?PND/Orthopnea ? ?Appetite *** ?Low-salt diet:  ? ?Physical Exam ?Cost/affordability of meds  ? ?Cardiac Sarcoidosis Medications (see telephone note from 05/20/2021 for full titration schedule):  ?Prednisone 20 mg daily (through 07/22/2021) ?Methotrexate 20 mg once weekly ?Bactrim 1 DS tablet every MWF ?Folic acid 1 mg daily ?Pantoprazole 40 mg daily ?Vitamin D 1000 IU daily ?Calcium citrate 950 mg BID ? ?HF Medications: ?Losartan 12.5 mg daily ?Farxiga 10 mg daily ?Furosemide 40 mg PRN ?Potassium chloride 20 mEq when taking furosemide ? ?Has the patient been experiencing any side effects to the medications prescribed?  {YES NO:22349} ? ?Does the patient have any problems obtaining medications due to transportation or finances?    ?BCBS Medicare - [facility] Glade copay will be $37 (used 30 day free card for first fill). ? ?Understanding of regimen: {excellent/good/fair/poor:19665} ?Understanding of indications: {excellent/good/fair/poor:19665} ?Potential of compliance: {excellent/good/fair/poor:19665} ?Patient understands to avoid NSAIDs. ?Patient understands  to avoid decongestants. ?  ? ?Pertinent Lab Values: ?Labs 06/03/2021: Serum creatinine 1.5, BUN 23, Potassium 3.7, Sodium 141 ?Labs 05/27/2021: Serum creatinine 1.4, BUN 26, Potassium  4.2, Sodium 134, Hgb 13.9, Plts 113 ? ?Vital Signs: ?Weight: *** (last clinic weight: 189.6 lbs) ?Blood pressure: *** 142/78 ?Heart rate: *** 78 ? ?Plan ?No labs today unless needed for sarcoid protocol, got bmet after losartan started ?Did she bring BP log? What is home BP ?GDMT and sarcoid protocol/titration ?A. Spiro 12.5 mg daily- eGFR 36- labs 1 week ?B. Entresto 24/26 mg BID- BP kinda variable high last time, but closer to 115-120s before, but still have room- labs 1 week ?C. Carvedilol 3.125 mg BID ? ?Assessment/Plan: ?1. Chronic systolic CHF (EF ***), due to ***. NYHA class *** symptoms. ?1.  Chronic systolic HF due to NICM  ?- Echo (07/2020): EF 30-35% ?- Cath (04/2021): minimal CAD. Hs-troponin 204 -> 379 ?- Echo (04/2021): EF 20-25% ?- cMRI (04/2021): EF 11% RVEF 15% LGE pattern concerning for sarcoid. (D/w Dr. Gardiner Rhyme)  ?- Only 2 small areas of sarcoid on cMRI. Suspect EF down mostly due to frequent PVCs. Should recover with suppression. ?- NYHA II, Volume status looks good today, weight stable. *** ?- Start losartan 12.5 mg daily. Eventual transition to [facility]. Check BP at home and bring log to next visit. *** ?- Continue Farxiga 10 mg daily. ?- Add spiro next if labs stable. *** ?- Holding b-blocker with recent bradycardia/low output for now. Plan to add back as able. *** ?  ?2. Cardiac sarcoidosis ?- based on cMRI and clinical course ?- ACE level normal (52 U/L). CT chest. No extra-cardiac sarcoid. ?Immunosuppressive Therapy for Cardiac Sarcoid  ?Prednisone (06/24/2021) Weeks 9-12: 20 mg daily ***I updated ?Methotrexate  Weeks 1&2: 10 mg (four 2.5 mg tablets) once weekly ? Weeks 3&4: 12.5 mg (five 2.5 mg tablets) once weekly ?Weeks 5&6: 15 mg (six 2.5 mg tablets) once weekly ?Weeks 7&8: 17.5 mg (seven 2.5 mg tablets) once weekly ?Weeks 9+ (starting 07/01/2021 and thereafter) :  20 mg (eight 2.5 mg tablets ) once weekly *** ?Additional Medications  ?Bactrim DS : 1 tablet Mon-Wed- Fri while taking >15 mg  of prednisone daily  ?Folic Acid : 1 mg daily  ?Calcium Citrate -- twice daily  ?Vitamin D: 1000 IU total daily  ?On protonix 40 mg daily while taking any dose of prednisone.    ?- Protocol labs today.*** ?  ?3. PVCs/NSVT/PMVT ?- EP following.  ?- did not tolerate amiodarone previously due to worsening bradycardia ?- S/p ICD 04/2021. ?- Continue amio 200 mg daily + mexiletine 200 bid for suppression.  ?  ?4. Paroxysmal Atrial Flutter ?- S/p DCCV 2/16 -> back in AFL on 05/02/2021.  ?- s/p TEE/DCCV 05/08/2021-->NSR ?- Continue Eliquis 5 mg bid. No bleeding issues. ?- Continue amio 200 mg daily. ?  ?5. H/o AKI ?- GDMT as above. ?- Labs today. ?  ?6. Thrombocytopenia ?- Platelets 142>133>92 ?- No bleeding issues. ?- May be due to MTX. Will need to follow closely. ?- CBC today. *** ?  ?7. Ingrown nail, R great toe *** ?- Does not look infected, but needs PCP or Podiatry follow up. ?- Given list of PCPs to establish care ?  ?Follow up in 4 weeks with PharmD (for 2 visits for GDMT/sarcoid protocol titration), 8-10 weeks with APP and 4 months with Dr. Haroldine Laws. ? ?Follow up 2 weeks with pharmacy for GDMT/sarcoid protocol titration, 2-4 weeks with APP*** ? ?Audry Riles,  PharmD, BCPS, BCCP, CPP ?Heart Failure Clinic Pharmacist ?[phone] ?  ?

## 2021-07-06 ENCOUNTER — Other Ambulatory Visit (HOSPITAL_COMMUNITY): Payer: Self-pay

## 2021-07-06 ENCOUNTER — Ambulatory Visit (HOSPITAL_COMMUNITY)
Admission: RE | Admit: 2021-07-06 | Discharge: 2021-07-06 | Disposition: A | Discharge status: Home / Self Care | Payer: Medicare Other | Source: Ambulatory Visit | Attending: Cardiology | Admitting: Cardiology

## 2021-07-06 VITALS — BP 142/82 | HR 60 | Wt 189.6 lb

## 2021-07-06 DIAGNOSIS — Z7901 Long term (current) use of anticoagulants: Secondary | ICD-10-CM | POA: Insufficient documentation

## 2021-07-06 DIAGNOSIS — I2510 Atherosclerotic heart disease of native coronary artery without angina pectoris: Secondary | ICD-10-CM | POA: Insufficient documentation

## 2021-07-06 DIAGNOSIS — Z79899 Other long term (current) drug therapy: Secondary | ICD-10-CM | POA: Diagnosis not present

## 2021-07-06 DIAGNOSIS — M797 Fibromyalgia: Secondary | ICD-10-CM | POA: Diagnosis not present

## 2021-07-06 DIAGNOSIS — I428 Other cardiomyopathies: Secondary | ICD-10-CM | POA: Insufficient documentation

## 2021-07-06 DIAGNOSIS — I110 Hypertensive heart disease with heart failure: Secondary | ICD-10-CM | POA: Diagnosis not present

## 2021-07-06 DIAGNOSIS — I4892 Unspecified atrial flutter: Secondary | ICD-10-CM | POA: Diagnosis not present

## 2021-07-06 DIAGNOSIS — D696 Thrombocytopenia, unspecified: Secondary | ICD-10-CM | POA: Diagnosis not present

## 2021-07-06 DIAGNOSIS — I480 Paroxysmal atrial fibrillation: Secondary | ICD-10-CM | POA: Insufficient documentation

## 2021-07-06 DIAGNOSIS — D869 Sarcoidosis, unspecified: Secondary | ICD-10-CM | POA: Diagnosis not present

## 2021-07-06 DIAGNOSIS — E039 Hypothyroidism, unspecified: Secondary | ICD-10-CM | POA: Diagnosis not present

## 2021-07-06 DIAGNOSIS — I493 Ventricular premature depolarization: Secondary | ICD-10-CM | POA: Diagnosis not present

## 2021-07-06 DIAGNOSIS — I5022 Chronic systolic (congestive) heart failure: Secondary | ICD-10-CM | POA: Diagnosis not present

## 2021-07-06 LAB — CBC WITH DIFFERENTIAL/PLATELET
Abs Immature Granulocytes: 0.28 10*3/uL — ABNORMAL HIGH (ref 0.00–0.07)
Basophils Absolute: 0 10*3/uL (ref 0.0–0.1)
Basophils Relative: 0 %
Eosinophils Absolute: 0 10*3/uL (ref 0.0–0.5)
Eosinophils Relative: 0 %
HCT: 43.2 % (ref 36.0–46.0)
Hemoglobin: 13.8 g/dL (ref 12.0–15.0)
Immature Granulocytes: 3 %
Lymphocytes Relative: 11 %
Lymphs Abs: 1.1 10*3/uL (ref 0.7–4.0)
MCH: 30.5 pg (ref 26.0–34.0)
MCHC: 31.9 g/dL (ref 30.0–36.0)
MCV: 95.4 fL (ref 80.0–100.0)
Monocytes Absolute: 0.4 10*3/uL (ref 0.1–1.0)
Monocytes Relative: 4 %
Neutro Abs: 8 10*3/uL — ABNORMAL HIGH (ref 1.7–7.7)
Neutrophils Relative %: 82 %
Platelets: 125 10*3/uL — ABNORMAL LOW (ref 150–400)
RBC: 4.53 MIL/uL (ref 3.87–5.11)
RDW: 19.9 % — ABNORMAL HIGH (ref 11.5–15.5)
WBC: 9.8 10*3/uL (ref 4.0–10.5)
nRBC: 0 % (ref 0.0–0.2)

## 2021-07-06 LAB — COMPREHENSIVE METABOLIC PANEL
ALT: 30 U/L (ref 0–44)
AST: 25 U/L (ref 15–41)
Albumin: 3.6 g/dL (ref 3.5–5.0)
Alkaline Phosphatase: 69 U/L (ref 38–126)
Anion gap: 10 (ref 5–15)
BUN: 20 mg/dL (ref 8–23)
CO2: 25 mmol/L (ref 22–32)
Calcium: 9.1 mg/dL (ref 8.9–10.3)
Chloride: 104 mmol/L (ref 98–111)
Creatinine, Ser: 1.36 mg/dL — ABNORMAL HIGH (ref 0.44–1.00)
GFR, Estimated: 40 mL/min — ABNORMAL LOW (ref >60)
Glucose, Bld: 100 mg/dL — ABNORMAL HIGH (ref 70–99)
Potassium: 4.7 mmol/L (ref 3.5–5.1)
Sodium: 139 mmol/L (ref 135–145)
Total Bilirubin: 0.3 mg/dL (ref 0.3–1.2)
Total Protein: 6.2 g/dL — ABNORMAL LOW (ref 6.5–8.1)

## 2021-07-06 MED ORDER — METHOTREXATE 2.5 MG PO TABS: ORAL_TABLET | ORAL | 6 refills | Status: DC

## 2021-07-06 MED ORDER — PREDNISONE 10 MG PO TABS: 20.0000 mg | ORAL_TABLET | Freq: Every day | ORAL | 6 refills | Status: DC

## 2021-07-06 MED ORDER — LEVOTHYROXINE SODIUM 200 MCG PO TABS: 200.0000 ug | ORAL_TABLET | Freq: Every day | ORAL | 0 refills | Status: DC

## 2021-07-06 MED ORDER — SPIRONOLACTONE 25 MG PO TABS: 12.5000 mg | ORAL_TABLET | Freq: Every day | ORAL | 3 refills | Status: DC

## 2021-07-06 NOTE — Patient Instructions (Addendum)
It was a pleasure seeing you today! ? ?MEDICATIONS: ?-We are changing your medications today ?-Start spironolactone 12.5 mg (half-tablet) daily ?-Call if you have questions about your medications. ? ?LABS: ?-We will call you if your labs need attention. ? ?NEXT APPOINTMENT: ?Return to clinic in 08/03/21 with NP. ? ?In general, to take care of your heart failure: ?-Limit your fluid intake to 2 Liters (half-gallon) per day.   ?-Limit your salt intake to ideally 2-3 grams (2000-3000 mg) per day. ?-Weigh yourself daily and record, and bring that "weight diary" to your next appointment.  (Weight gain of 2-3 pounds in 1 day typically means fluid weight.) ?-The medications for your heart are to help your heart and help you live longer.   ?-Please contact us before stopping any of your heart medications. ? ?Call the clinic at [phone] with questions or to reschedule future appointments.  ?

## 2021-07-06 NOTE — Progress Notes (Signed)
?  ?Advanced Heart Failure Clinic Note  ? ?Primary Care: Janith Lima, MD ?HF Cardiologist: Dr. Haroldine Laws ?  ?HPI: ?Sheri Morgan is a 76 y.o. obese woman with fibromyalgia, PAF, PVCs, HTN and systolic HF. ?  ?Previously followed by Dr. Donata Clay in [place] Saw Dr. Delsa Bern in 04/2020 with recurrent AF. Echo 07/2020 with newly discovered LV dysfunction EF 30-35%. She had no follow-up here since that time. ?  ?Admitted 04/2021 with CP and worsening SOB. CT negative for dissection. Underwent cath showing only 30% stenosis of proximal LAD and elevated LVEDP EF 20-25%. ? Possible takotsubo.  Echo 20-25% moderate MR. On way to cath developed VT. Started on amiodarone developed more bradycardia -> PMVT. She had subsequent hypotension and cardiogenic shock requiring DBA gtt. EP consulted and lidocaine gtt started with reduced PVC burden. Lido gtt weaned off and mexiletine started but was stopped. She developed AFL with RVR and underwent DCCV. Initially successful but then had recurrent AFL. Due to bradycardia and need for amiodarone, underwent placement of dual chamber ICD. Then loaded with IV amiodarone and underwent successful TEE/DCCV. She remained on amiodarone for PVCs and mexiletine restarted when PVCs not fully suppressed. cMRI suspicious for cardiac sarcoid and started on treatment protocol. GDMT limited by elevated SCr, discharged home, weight 191 lbs. ?  ?She returned for post hospital HF follow up with her daughter on 05/27/2021. Overall she was feeling fine. She felt mildly weak and SOB if she pushed herself and had not been physically active since discharge. She was having occasional dizziness but no falls. Denied palpitations, CP, abnormal bleeding edema, or PND/Orthopnea. Appetite was ok and  weight at home had been 186 pounds. She was taking all her medications. Denied hand/finger numbness but had a stubbed right great toe in hospital and was having pain at site. ? ?Today she returns to HF clinic for pharmacist  medication titration. At last visit with NP she was started on losartan 12.5 mg daily. Overall she is feeling good today. Denies dizziness, lightheadedness, chest pain or palpitations. She does have some fatigue and states "I could use a nap." Her breathing is okay overall, but she has not been walking that much since hospital discharge. She was SOB after walking up about 15 stairs and walking 0.5 mile, but is able to complete all ADLs without SOB. Her weight at home has been stable at ~185 lbs. She is taking furosemide 40 mg PRN and has only required 1 dose since her last visit. Trace LEE on exam, which is stable for her. She denies PND, but has been sleeping on 3 pillows since her ICD mostly for comfort; her baseline is 2 pillows. She is unsure if she is able to lay flat without SOB. BP is elevated today in clinic, she has been checking at home, but forgot her log today. Her appetite is good and she has been trying to adhere to a low-salt diet.  ? ?Cardiac Sarcoidosis Medications (see telephone note from 05/20/2021 for full titration schedule):  ?Prednisone 20 mg daily (through 07/22/2021) ?Methotrexate 20 mg once weekly (every Thursday) ?Bactrim 1 DS tablet every MWF ?Folic acid 1 mg daily ?Pantoprazole 40 mg daily ?Vitamin D 1000 IU daily ?Calcium citrate 950 mg BID ? ?HF Medications: ?Losartan 12.5 mg daily ?Farxiga 10 mg daily ?Furosemide 40 mg PRN ?Potassium chloride 20 mEq when taking furosemide ? ?Has the patient been experiencing any side effects to the medications prescribed?   ?Scaly skin on arms and kneecaps since starting methotrexate ? ?  Does the patient have any problems obtaining medications due to transportation or finances?    ?[employer] Medicare - Farxiga copay $37  ? ?Understanding of regimen: fair ?Understanding of indications: fair ?Potential of compliance: good ?Patient understands to avoid NSAIDs. ?Patient understands to avoid decongestants. ?  ? ?Pertinent Lab Values: ?Labs 07/06/2021: pending ?Labs  06/03/2021: Serum creatinine 1.5, BUN 23, Potassium 3.7, Sodium 141 ?Labs 05/27/2021: Serum creatinine 1.4, BUN 26, Potassium 4.2, Sodium 134, Hgb 13.9, Plts 113 ? ?Vital Signs: ?Weight: 189.6 lbs (last clinic weight: 189.6 lbs) ?Blood pressure: 142/82 mmHg ?Heart rate: 60 bpm ? ?Assessment/Plan: ?1.  Chronic systolic HF due to NICM  ?- Echo (07/2020): EF 30-35% ?- Cath (04/2021): minimal CAD. Hs-troponin 204 -> 379 ?- Echo (04/2021): EF 20-25% ?- cMRI (04/2021): EF 11% RVEF 15% LGE pattern concerning for sarcoid. (D/w Dr. Gardiner Rhyme)  ?- Only 2 small areas of sarcoid on cMRI. Suspect EF down mostly due to frequent PVCs. Should recover with suppression. ?- NYHA II, Volume status looks ok today, weight stable. ?- Continue furosemide 40 mg PRN weight gain. Take potassium 20 mEq with each furosemide dose.  ?-Continue losartan 12.5 mg daily. Eventual transition to [facility]. ?-Continue Farxiga 10 mg daily ?-Start spironolactone 12.5 mg daily - Repeat BMET in 1 week  ?- Holding b-blocker with recent bradycardia/low output for now. Plan to add back as able ?  ?2. Cardiac sarcoidosis ?- based on cMRI and clinical course ?- ACE level normal (52 U/L). CT chest. No extra-cardiac sarcoid. ?-See 05/20/2021 telephone note for full titration protocol ?-Continue prednisone 20 mg daily (through 07/21/2021) per titration protocol, then decrease to 15 mg daily.  ?-Continue methotrexate 20 mg (8 tablets) weekly on Thursday per titration protocol ?-Continue Bactrim DS 1 tablet Mon-Wed- Fri through 08/19/2021 per titration protocol ?-Continue folic Acid 1 mg daily  ?-Continue Calcium Citrate twice daily  ?-Continue vitamin D 1000 IU total daily  ?-Continue protonix 40 mg daily through 10/14/2021 per titration protocol ?- CMET and CBC/diff pending ?  ?3. PVCs/NSVT/PMVT ?- EP following.  ?- did not tolerate amiodarone previously due to worsening bradycardia ?- S/p ICD 04/2021. ?- Continue amiodarone 200 mg daily + mexiletine 200 mg BID for suppression.  ?   ?4. Paroxysmal Atrial Flutter ?- S/p DCCV 2/16 -> back in AFL on 05/02/2021.  ?- s/p TEE/DCCV 05/08/2021-->NSR ?- Continue Eliquis 5 mg BID. No bleeding issues. ?- Continue amiodarone 200 mg daily. ?  ?5. H/o AKI ?- GDMT as above. ?- Labs today pending. ?  ?6. Thrombocytopenia ?- Platelets 142>133>92 ?- No bleeding issues. ?- May be due to MTX. Will need to follow closely. ?- CBC today pending. ?  ?7. Hypothyroidism ?-Out of levothyroxine 200 mcg for the past week, refill x 30 days given ?-All further refills per PCP ?- PCP appointment to establish care 07/13/2021 ? ?Follow up 08/03/2021 with NP/PA ? ?Audry Riles, PharmD, BCPS, BCCP, CPP ?Heart Failure Clinic Pharmacist ?[phone] ?  ?

## 2021-07-09 ENCOUNTER — Other Ambulatory Visit (HOSPITAL_COMMUNITY): Payer: Self-pay

## 2021-07-09 MED ORDER — LOSARTAN POTASSIUM 25 MG PO TABS: 12.5000 mg | ORAL_TABLET | Freq: Every day | ORAL | 0 refills | Status: DC

## 2021-07-12 NOTE — Progress Notes (Signed)
? ?New Patient Office Visit ? ?Subjective   ? ?Patient ID: Sheri Morgan, adult    DOB: [birth year]  Age: 76 y.o. MRN: [id number] ? ?CC:  ?Chief Complaint  ?Patient presents with  ? New Patient (Initial Visit)  ? ? ?HPI ?Sheri Morgan presents to establish care ?The patient is accompanied by her daughter-in-law who is a oncology nurse her name is Sheri Morgan ?This patient is a former primary care patient [facility].  Patient is transferring care to our clinic.  Note she had been admitted in February of this year with new onset heart failure cardiogenic shock atrial flutter.  Found to have cardiac sarcoidosis on evaluations.  She is on high-dose steroids and methotrexate to treat the cardiac sarcoidosis.  She does not have any other extracardiac findings of sarcoid. ? ?Below is a copy of the discharge summary. ? ? ?Patient ID: Sheri Morgan ?MRN: [id number], DOB/AGE: May 15, 1945 76 y.o. Admit date: 04/23/2021 ?D/C date:     05/11/2021  ?  ?Primary Discharge Diagnoses:  ?Acute on Chronic Systolic CHF ?Cardiogenic Shock - Resolved ?Non-Ischemic Cardiomyopathy ?Cardiac Sarcoidosis ?Non-Sustained VT/ PMVT/ PVCs ?Paroxysmal Atrial Flutter ?S/p ICD ?AKI on CKD 3b ?  ?  ?Hospital Course:  ?Sheri Morgan is a 76 year old obese female with a history of chronic systolic CHF with EF of 99991111, paroxysmal atrial fibrillation not compliant with Eliquis prior to admission, PVCs, hypertension, and fibromyalgia. She was previously seen by Sheri Morgan in [place] and then established with Sheri Morgan in 04/2020 for recurrent atrial fibrillation. Echo in 07/2020 showed LVEF of 30-35% which was new. Patient was lost to follow-up after this. ?  ?Patient was admitted on 04/23/2021 with chest pain and worsening shortness of breath. Labs were notable for high-sensitivity troponin of 204 >> 379 and BNP of 379.1. CTA was negative for aortic dissection. She underwent cardiac catheterization on 2/9 which showed mild non-obstructive CAD with  only 30% stenosis of proximal LAD and elevated LVEDP. Echo showed LVEF of 20-25% with global hypokinesis, mildly enlarged RV with moderately reduced systolic function, mild to moderate MR, and moderately elevated RVSP of 54.0 mmHg.  ?  ?Developed VT and was started on Amiodarone but had more bradycardia >> PMVT (brady mediated). This was complicated by hypotension and cardiogenic shock requiring Dobutamine which was weaned off on 2/14. EP was consulted and she was started on Lidocaine with reduced PVC burden but this was later stopped due to nausea/vomiting. Subsequently, she was started on Mexiletine but this was ultimately stopped as well. She then developed atrial flutter with RVR on 2/14. She underwent DCCV on 2/16 which was initially successful but then she had recurrent atrial flutter on 2/18. Due to bradycardia and need for Amiodarone, she underwent placement of dual chamber ICD on 2/20. She was then loaded with IV Amiodarone and underwent repeat TEE/DCCV on 2/24. No recurrent atrial arrhythmias prior to discharge. Kept on amiodarone for PVCs and mexiletine restarted when PVCs not fully suppressed. ?  ?CMRI raised concern for cardiac sarcoidosis. Started on treatment per PharmD protocol (prednisone + methotrexate + bactrim/folic acid/calcium citrate/vitaminD, protonix). ?  ?She will follow-up with PharmD for CHF and sarcoid medication titration 05/20/21. Follow-up with HF APP clinic 05/27/21. ?  ?Scheduled for follow-up in device clinic for wound check on 03/02. ?  ?Seen by PT/OT. No needs identified. ?  ?  ?Hospital Course by Problem: ?1.  Acute on chronic systolic HF due to NICM -> cardiogenic shock. Suspect cardiac sarcoid based  on cardiac MRI.  ?- 5/22 ECHO EF 30-35% ?- Cath 2/23 minimal CAD. hstroponin 204 -> 379 ?- Echo 2/23: EF 20-25% ?- cMRI EF 11% RVEF 15% LGE pattern concerning for sarcoid. (D/w Sheri Morgan)  ?- Only 2 small areas of sarcoid on cMRI. Suspect EF down mostly due to frequent PVCs.  Should recover with suppression ?- Initially on DBA for support. Now off ?- Diuresed with intermittent IV lasix. Last dose 02/25. Volume stable today. PRN lasix at discharge. ?- Continue Farxiga 10 ?- Off Entresto and Cleda Daub on hold w/ soft BP and AKI/hyperkalemia ?- Holding b-blocker with bradycardia/low output ?- Would like to add losartan 12.5 if K remains stable. Will wait until clinic  ?  ?2. Cardiac sarcoidosis ?- based on cMRI and clinical course ?- ACE level normal (52 U/L). CT chest. No extra-cardiac sarcoid ?Immunosuppressive Therapy for Cardiac Sarcoid  ?Prednisone (04/29/21) Weeks 1-4: 30 mg daily  ?Methotrexate  (04/30/21 & 2/23/223) Weeks 1&2: 10 mg (four 2.5 mg tablets) once weekly ? Weeks 3&4 starting 05/14/21 : 12.5 mg (five 2.5 mg tablets) once weekly ?Weeks 5&6: 15 mg (six 2.5 mg tablets) once weekly  ?Weeks 7&8: 17.5 mg (seven 2.5 mg tablets) once weekly ?Weeks 9+:  20 mg (eight 2.5 mg tablets ) once weekly  ?Additional Medications  ?Bactrim DS : 1 tablet Mon-Wed- Fri while taking >15 mg of prednisone daily  ?Folic Acid : 1 mg daily  ?Calcium Citrate -- switch to twice a day   ?Vitamin D: 1000 IU total daily  ?On protonix 40 mg daily while taking any dose of prednisone.    ?  ?3. PVCs/NSVT/PMVT ?- EP following.  ?- did not tolerate amiodarone previously due to worsening bradycardia ?- off lido due to n/v that said PVC burden much decreased with lido ?- mexiletine discontinued 2/21 per EP ?- S/p ICD 02/23 ?- On po amio 200 mg daily.  ?- Added back mexiletine 200 mg BID on 02/26 ?- PVC burden improved, down to 2-5/min ?  ?4. Paroxysmal Atrial Flutter ?- S/p DCCV 2/16 -> back in AFL on 2/18.  ?- Continue Eliquis  ?- s/p TEE/DCCV t2/24  ?- Apaced today ?- Continue amio ?  ?4. Hypokalemia/hyperkalemia ?- Stable today ?  ?5. AKI ?- SCr 1.47>>1.60>>1.74> 1.35 -> 1.6>1.45 ?- BP ok  ?- continue to hold Entresto and Cleda Daub  ?- encouraged PO intake  ?  ?  ?Discharge Weight Range: 202 lb >>191 lb ?  ?Discharge  Vitals: Blood pressure 124/85, pulse 68, temperature (!) 97.4 ?F (36.3 ?C), temperature source Oral, resp. rate 17, height 5\' 3"  (1.6 m), weight 87 kg, SpO2 98 %. ? 1 ?ASSESSMENT & PLAN: ?1.  Chronic systolic HF due to NICM  ?- Echo (5/22): EF 30-35% ?- Cath (2/23): minimal CAD. Hs-troponin 204 -> 379 ?- Echo (2/23): EF 20-25% ?- cMRI (2/23): EF 11% RVEF 15% LGE pattern concerning for sarcoid. (D/w Sheri Morgan)  ?- Only 2 small areas of sarcoid on cMRI. Suspect EF down mostly due to frequent PVCs. Should recover with suppression. ?- NYHA II, Volume status looks good today, weight stable. ?- Start losartan 12.5 mg daily. Eventual transition to [facility]. Check BP at home and bring log to next visit. ?- Continue Farxiga 10 mg daily. ?- Add spiro next if labs stable. ?- Holding b-blocker with recent bradycardia/low output for now. Plan to add back as able. ?- Labs today, BMET in 10 days. ?  ?2. Cardiac sarcoidosis ?- based on cMRI and clinical  course ?- ACE level normal (52 U/L). CT chest. No extra-cardiac sarcoid. ?Immunosuppressive Therapy for Cardiac Sarcoid  ?Prednisone (04/29/21) Weeks 1-4: 30 mg daily  ?Methotrexate  (04/30/21 & 2/23/223) Weeks 1&2: 10 mg (four 2.5 mg tablets) once weekly ? Weeks 3&4 starting 05/14/21 : 12.5 mg (five 2.5 mg tablets) once weekly ?Weeks 5&6: 15 mg (six 2.5 mg tablets) once weekly  ?Weeks 7&8: 17.5 mg (seven 2.5 mg tablets) once weekly ?Weeks 9+:  20 mg (eight 2.5 mg tablets ) once weekly  ?Additional Medications  ?Bactrim DS : 1 tablet Mon-Wed- Fri while taking >15 mg of prednisone daily  ?Folic Acid : 1 mg daily  ?Calcium Citrate -- switch to twice a day   ?Vitamin D: 1000 IU total daily  ?On protonix 40 mg daily while taking any dose of prednisone.    ?- Protocol labs today. ?  ?3. PVCs/NSVT/PMVT ?- EP following.  ?- did not tolerate amiodarone previously due to worsening bradycardia ?- S/p ICD 02/23. ?- Continue amio 200 mg daily + mexiletine 200 bid for suppression.  ?  ?4.  Paroxysmal Atrial Flutter ?- S/p DCCV 2/16 -> back in AFL on 05/02/21.  ?- s/p TEE/DCCV 05/08/21-->NSR ?- Continue Eliquis 5 mg bid. No bleeding issues. ?- Continue amio 200 mg daily. ?  ?5. H/o AKI ?- GDMT as above.

## 2021-07-13 ENCOUNTER — Encounter: Payer: Self-pay | Admitting: Critical Care Medicine

## 2021-07-13 ENCOUNTER — Ambulatory Visit: Payer: Medicare Other | Attending: Critical Care Medicine | Admitting: Critical Care Medicine

## 2021-07-13 VITALS — BP 151/86 | HR 64 | Ht 64.0 in | Wt 186.0 lb

## 2021-07-13 DIAGNOSIS — I10 Essential (primary) hypertension: Secondary | ICD-10-CM

## 2021-07-13 DIAGNOSIS — I4811 Longstanding persistent atrial fibrillation: Secondary | ICD-10-CM

## 2021-07-13 DIAGNOSIS — L600 Ingrowing nail: Secondary | ICD-10-CM

## 2021-07-13 DIAGNOSIS — G992 Myelopathy in diseases classified elsewhere: Secondary | ICD-10-CM

## 2021-07-13 DIAGNOSIS — N1831 Chronic kidney disease, stage 3a: Secondary | ICD-10-CM

## 2021-07-13 DIAGNOSIS — M4802 Spinal stenosis, cervical region: Secondary | ICD-10-CM

## 2021-07-13 DIAGNOSIS — I5042 Chronic combined systolic (congestive) and diastolic (congestive) heart failure: Secondary | ICD-10-CM

## 2021-07-13 DIAGNOSIS — E039 Hypothyroidism, unspecified: Secondary | ICD-10-CM | POA: Diagnosis not present

## 2021-07-13 DIAGNOSIS — R7303 Prediabetes: Secondary | ICD-10-CM

## 2021-07-13 DIAGNOSIS — Z23 Encounter for immunization: Secondary | ICD-10-CM

## 2021-07-13 DIAGNOSIS — D8685 Sarcoid myocarditis: Secondary | ICD-10-CM

## 2021-07-13 DIAGNOSIS — I428 Other cardiomyopathies: Secondary | ICD-10-CM

## 2021-07-13 DIAGNOSIS — E785 Hyperlipidemia, unspecified: Secondary | ICD-10-CM

## 2021-07-13 DIAGNOSIS — I214 Non-ST elevation (NSTEMI) myocardial infarction: Secondary | ICD-10-CM

## 2021-07-13 MED ORDER — CICLOPIROX 8 % EX SOLN: Freq: Every day | CUTANEOUS | 0 refills | Status: DC

## 2021-07-13 NOTE — Assessment & Plan Note (Signed)
Referral to podiatry was made 

## 2021-07-13 NOTE — Assessment & Plan Note (Signed)
Continue cholesterol therapy per cardiology. 

## 2021-07-13 NOTE — Assessment & Plan Note (Signed)
Higher NSTEMI management per cardiology ?

## 2021-07-13 NOTE — Assessment & Plan Note (Addendum)
Chronic atrial fibrillation with rate control ?Continue Eliquis ?

## 2021-07-13 NOTE — Assessment & Plan Note (Signed)
Currently stable this time we will monitor ?

## 2021-07-13 NOTE — Assessment & Plan Note (Signed)
A1c of 6.0 is on Farxiga for heart failure ?

## 2021-07-13 NOTE — Assessment & Plan Note (Signed)
Management per cardiology patient does have an ICD ?

## 2021-07-13 NOTE — Assessment & Plan Note (Signed)
Recommended healthy plant-based diet ?

## 2021-07-13 NOTE — Assessment & Plan Note (Signed)
MRI findings consistent with cardiac sarcoidosis prednisone and methotrexate per cardiology ?

## 2021-07-13 NOTE — Assessment & Plan Note (Signed)
Hypertension persists however management is per cardiology ? ?Will monitor for now ?

## 2021-07-13 NOTE — Assessment & Plan Note (Signed)
With resting tremor need to assess thyroid function this will be sent ?

## 2021-07-13 NOTE — Assessment & Plan Note (Signed)
Management per heart failure clinic ?

## 2021-07-13 NOTE — Assessment & Plan Note (Signed)
Improvement in chronic renal insufficiency will monitor ?

## 2021-07-13 NOTE — Patient Instructions (Signed)
No change in medications ? ?Thyroid function to be obtained today and based on this we will determine dose of your thyroid medication ? ?Pneumonia vaccine was given ? ?Return to see Dr. Joya Gaskins 2 months ? ?Foot podiatry appointment will be made ?

## 2021-07-14 ENCOUNTER — Other Ambulatory Visit: Payer: Self-pay | Admitting: Critical Care Medicine

## 2021-07-14 ENCOUNTER — Other Ambulatory Visit (HOSPITAL_COMMUNITY): Payer: Medicare Other

## 2021-07-14 DIAGNOSIS — E039 Hypothyroidism, unspecified: Secondary | ICD-10-CM

## 2021-07-14 LAB — THYROID PANEL WITH TSH
Free Thyroxine Index: 3 (ref 1.2–4.9)
T3 Uptake Ratio: 33 % (ref 24–39)
T4, Total: 9.2 ug/dL (ref 4.5–12.0)
TSH: 0.057 u[IU]/mL — ABNORMAL LOW (ref 0.450–4.500)

## 2021-07-14 MED ORDER — LEVOTHYROXINE SODIUM 200 MCG PO TABS: 200.0000 ug | ORAL_TABLET | Freq: Every day | ORAL | 2 refills | Status: DC

## 2021-07-21 ENCOUNTER — Ambulatory Visit (HOSPITAL_COMMUNITY)
Admission: RE | Admit: 2021-07-21 | Discharge: 2021-07-21 | Disposition: A | Discharge status: Home / Self Care | Payer: Medicare Other | Source: Ambulatory Visit | Attending: Cardiology | Admitting: Cardiology

## 2021-07-21 DIAGNOSIS — Z5189 Encounter for other specified aftercare: Secondary | ICD-10-CM | POA: Insufficient documentation

## 2021-07-21 DIAGNOSIS — I5022 Chronic systolic (congestive) heart failure: Secondary | ICD-10-CM | POA: Insufficient documentation

## 2021-07-21 LAB — BASIC METABOLIC PANEL
Anion gap: 9 (ref 5–15)
BUN: 26 mg/dL — ABNORMAL HIGH (ref 8–23)
CO2: 26 mmol/L (ref 22–32)
Calcium: 9.6 mg/dL (ref 8.9–10.3)
Chloride: 103 mmol/L (ref 98–111)
Creatinine, Ser: 1.65 mg/dL — ABNORMAL HIGH (ref 0.44–1.00)
GFR, Estimated: 32 mL/min — ABNORMAL LOW (ref >60)
Glucose, Bld: 96 mg/dL (ref 70–99)
Potassium: 5.1 mmol/L (ref 3.5–5.1)
Sodium: 138 mmol/L (ref 135–145)

## 2021-07-23 ENCOUNTER — Encounter: Payer: Self-pay | Admitting: Podiatry

## 2021-07-23 ENCOUNTER — Ambulatory Visit: Payer: Medicare Other | Admitting: Podiatry

## 2021-07-23 DIAGNOSIS — M79675 Pain in left toe(s): Secondary | ICD-10-CM | POA: Diagnosis not present

## 2021-07-23 DIAGNOSIS — M79674 Pain in right toe(s): Secondary | ICD-10-CM

## 2021-07-23 DIAGNOSIS — L600 Ingrowing nail: Secondary | ICD-10-CM | POA: Diagnosis not present

## 2021-07-23 DIAGNOSIS — B351 Tinea unguium: Secondary | ICD-10-CM | POA: Diagnosis not present

## 2021-07-23 NOTE — Progress Notes (Signed)
Subjective:  ? ?Patient ID: Sheri Morgan, adult   DOB: 76 y.o.   MRN: [id number]  ? ?HPI ?Patient presents with ingrown toenail deformity right hallux and generalized nail disease bilateral with also vascular issues cardiac issues with no claudication when I asked her currently.  Patient does not smoke and likes to try to be active ? ? ?Review of Systems  ?All other systems reviewed and are negative. ? ? ?   ?Objective:  ?Physical Exam ?Vitals and nursing note reviewed.  ?Constitutional:   ?   Appearance: Sheri Morgan is well-developed.  ?Pulmonary:  ?   Effort: Pulmonary effort is normal.  ?Musculoskeletal:     ?   General: Normal range of motion.  ?Skin: ?   General: Skin is warm.  ?Neurological:  ?   Mental Status: Sheri Morgan is alert.  ?  ?Neuro logical status shows mild diminishment sharp dull vibratory and I did note nonpalpable pulses DP PT but she does keep an eye on this with her cardiologist.  Patient is found to have thick nailbeds 1-5 both feet with the right hallux over the left hallux being incurvated and sore in the corner.  Patient has good digital perfusion is well oriented ? ?   ?Assessment:  ?Chronic ingrown toenail deformity right hallux thick nailbeds 1-5 both feet ? ?   ?Plan:  ?H&P reviewed condition and circulatory status and I do want to keep an eye on this but since she is not showing advanced claudication we will just call it something that is not giving her problems.  I do recommend removal of the nail corner right but due to her lack of circulation I do want to take a chance on doing a permanent procedure so I went ahead and anesthetized 60 mg like Marcaine mixture sterile prep and I would carefully remove the medial border cleaned out some slight thickened skin and applied sterile dressing.  Debrided remaining nails reappoint as needed someday we may need to do a permanent procedure but I like to avoid ?   ? ? ?

## 2021-07-24 ENCOUNTER — Other Ambulatory Visit (HOSPITAL_COMMUNITY): Payer: Self-pay

## 2021-07-26 ENCOUNTER — Other Ambulatory Visit (HOSPITAL_COMMUNITY): Payer: Self-pay | Admitting: Cardiology

## 2021-07-26 DIAGNOSIS — I5022 Chronic systolic (congestive) heart failure: Secondary | ICD-10-CM

## 2021-08-02 NOTE — Progress Notes (Addendum)
ADVANCED HF CLINIC CONSULT NOTE   Primary Care: Storm Frisk, MD HF Cardiologist: Dr. Gala Romney  HPI: Sheri Morgan is a 76 y.o. obese woman with fibromyalgia, PAF, PVCs, HTN, sarcoid,  and systolic HF   Previously followed by Dr. Alonza Bogus [place] Saw Dr. Ashby Dawes in 2/22 with recurrent AF. Echo 5/22 with newly discovered LV dysfunction EF 30-35%.   Admitted 2/23 with CP and worsening SOB. CT negative for dissection. Underwent cath showing only 30% stenosis of proximal LAD and elevated LVEDP EF 20-25%. ? Possible takotsubo.  Echo 20-25% moderate MR. On way to cath developed VT. Started on amiodarone developed more bradycardia -> PMVT. She had subsequent hypotension and cardiogenic shock requiring DBA gtt. EP consulted and lidocaine gtt started with reduced PVC burden. Lido gtt weaned off and mexiletine started but was stopped. She developed AFL with RVR and underwent DCCV. Initially successful but then had recurrent AFL. Due to bradycardia and need for amiodarone, underwent placement of dual chamber ICD. Then loaded with IV amio and underwent successful TEE/DCCV. She remained on amio for PVCs and mexiletine restarted when PVCs not fully suppressed. cMRI suspicious for cardiac sarcoid and started on treatment protocol. GDMT limited by elevated SCr, discharged home, weight 191 lbs.  Today she returns for HF follow up with her daughter in law.. Followed closely for sarcoid. Overall feeling fine. Denies SOB/PND/Orthopnea. Appetite ok. No fever or chills. Weight at home has been stable. Taking all medications. She has been taking prednisone 20 mg daily instead of 15 mg daily.   Cardiac Studies: - Echo (2/23): LVEF of 20-25% with global hypokinesis, mildly enlarged RV with moderately reduced systolic function, mild to moderate MR, and moderately elevated RVSP of 54.0 mmHg.   - LHC (2/23): 30% stenosis of pLAD, elevated LVEDP  - cMRI (2/23): LVEF 11%, RVEF 55%, apical LGE, no LV thrombus, pattern  concerning for cardiac sarcoid.  Past Medical History:  Diagnosis Date   Anxiety    Atypical atrial flutter (HCC)    Cardiac sarcoidosis    Chronic combined systolic and diastolic CHF (congestive heart failure) (HCC)    Fibromyalgia    Hypertension    Non-ischemic cardiomyopathy (HCC)    LHC 04/23/2021: 30% stenosis of proximal LAD   Paroxysmal atrial fibrillation (HCC)    Premature atrial contractions    Premature ventricular contraction    S/P ICD (internal cardiac defibrillator) procedure    Tachy-brady syndrome (HCC)    VT (ventricular tachycardia) (HCC)    Current Outpatient Medications  Medication Sig Dispense Refill   amiodarone (PACERONE) 200 MG tablet Take 1 tablet (200 mg total) by mouth daily. 30 tablet 3   apixaban (ELIQUIS) 5 MG TABS tablet Take 1 tablet (5 mg total) by mouth 2 (two) times daily. 60 tablet 3   calcium citrate (CALCITRATE - DOSED IN MG ELEMENTAL CALCIUM) 950 (200 Ca) MG tablet Take 1 tablet (200 mg of elemental calcium total) by mouth 2 (two) times daily. 30 tablet 6   ciclopirox (PENLAC) 8 % solution Apply topically at bedtime. Apply over nail and surrounding skin. Apply daily over previous coat. After seven (7) days, may remove with alcohol and continue cycle. 6.6 mL 0   dapagliflozin propanediol (FARXIGA) 10 MG TABS tablet Take 1 tablet (10 mg total) by mouth daily. 30 tablet 3   folic acid (FOLVITE) 1 MG tablet Take 1 tablet (1 mg total) by mouth daily. 30 tablet 3   furosemide (LASIX) 40 MG tablet Take 1 tablet as  needed for weight gain > 3 lb in a day or 5 lb in a week, leg edema, shortness of breath 30 tablet 3   levothyroxine (SYNTHROID) 200 MCG tablet Take 1 tablet (200 mcg total) by mouth daily before breakfast. Take 1 tablet (200 mcg total) by mouth daily before breakfast. Further refills must be from PCP. 90 tablet 2   losartan (COZAAR) 25 MG tablet Take 0.5 tablets (12.5 mg total) by mouth daily. 90 tablet 0   methotrexate (RHEUMATREX) 2.5 MG  tablet TAKE 8 TABLETS BY MOUTH EVERY THURSDAY (Patient taking differently: TAKE 8 TABLETS BY MOUTH EVERY Friday.) 96 tablet 3   mexiletine (MEXITIL) 200 MG capsule Take 1 capsule (200 mg total) by mouth 2 (two) times daily. 60 capsule 3   pantoprazole (PROTONIX) 40 MG tablet Take 1 tablet (40 mg total) by mouth daily. 30 tablet 3   potassium chloride SA (KLOR-CON M) 20 MEQ tablet Take 1 tablet (20 mEq total) by mouth when taking furosemide 30 tablet 3   prednisoLONE acetate (PRED FORTE) 1 % ophthalmic suspension Place 1 drop into the left eye every evening.     predniSONE (DELTASONE) 10 MG tablet Take 2 tablets (20 mg total) by mouth daily with breakfast. Take 2 tablets (20 mg total) by mouth daily with breakfast or as directed by the heart failure clinic 30 tablet 6   rosuvastatin (CRESTOR) 40 MG tablet Take 1 tablet (40 mg total) by mouth daily. 30 tablet 3   spironolactone (ALDACTONE) 25 MG tablet Take 0.5 tablets (12.5 mg total) by mouth daily. 45 tablet 3   sulfamethoxazole-trimethoprim (BACTRIM DS) 800-160 MG tablet Take 1 tablet by mouth every Monday, Wednesday, and Friday. 12 tablet 3   Vitamin D3 (VITAMIN D) 25 MCG tablet Take 1 tablet (1,000 Units total) by mouth daily. 30 tablet 6   No current facility-administered medications for this encounter.   Allergies  Allergen Reactions   Codeine Itching   Social History   Socioeconomic History   Marital status: Divorced    Spouse name: Not on file   Number of children: Not on file   Years of education: Not on file   Highest education level: Not on file  Occupational History   Not on file  Tobacco Use   Smoking status: Never   Smokeless tobacco: Never  Vaping Use   Vaping Use: Never used  Substance and Sexual Activity   Alcohol use: No   Drug use: No   Sexual activity: Never  Other Topics Concern   Not on file  Social History Narrative   Retired from [employer]   Social Determinants of Corporate investment banker Strain: Low  Risk    Difficulty of Paying Living Expenses: Not hard at all  Food Insecurity: No Food Insecurity   Worried About Programme researcher, broadcasting/film/video in the Last Year: Never true   Barista in the Last Year: Never true  Transportation Needs: No Transportation Needs   Lack of Transportation (Medical): No   Lack of Transportation (Non-Medical): No  Physical Activity: Insufficiently Active   Days of Exercise per Week: 1 day   Minutes of Exercise per Session: 30 min  Stress: No Stress Concern Present   Feeling of Stress : Not at all  Social Connections: Moderately Integrated   Frequency of Communication with Friends and Family: More than three times a week   Frequency of Social Gatherings with Friends and Family: Not on file   Attends Religious  Services: More than 4 times per year   Active Member of Clubs or Organizations: Yes   Attends Banker Meetings: Never   Marital Status: Divorced  Catering manager Violence: Not At Risk   Fear of Current or Ex-Partner: No   Emotionally Abused: No   Physically Abused: No   Sexually Abused: No   Family History  Problem Relation Age of Onset   Diabetes Mother    Alcoholism Father    BP 136/74   Pulse 62   Wt 83.1 kg (183 lb 3.2 oz)   SpO2 96%   BMI 31.45 kg/m   Wt Readings from Last 3 Encounters:  08/03/21 83.1 kg (183 lb 3.2 oz)  07/13/21 84.4 kg (186 lb)  07/06/21 86 kg (189 lb 9.6 oz)   PHYSICAL EXAM: General:  Walked in the clinic. No resp difficulty HEENT: normal Neck: supple. no JVD. Carotids 2+ bilat; no bruits. No lymphadenopathy or thryomegaly appreciated. Cor: PMI nondisplaced. Regular rate & rhythm. No rubs, gallops or murmurs. Lungs: clear Abdomen: soft, nontender, nondistended. No hepatosplenomegaly. No bruits or masses. Good bowel sounds. Extremities: no cyanosis, clubbing, rash, edema Neuro: alert & orientedx3, cranial nerves grossly intact. moves all 4 extremities w/o difficulty. Affect pleasant   ASSESSMENT &  PLAN: 1.  Chronic systolic HF due to NICM  - Echo (5/22): EF 30-35% - Cath (2/23): minimal CAD. Hs-troponin 204 -> 379 - Echo (2/23): EF 20-25% - cMRI (2/23): EF 11% RVEF 15% LGE pattern concerning for sarcoid. (D/w Dr. Bjorn Pippin)  - NYHA II. Volume status stable. Continue lasix as needed.  - Continue losartan  - Continue spiro  -Continue farxiga - Holding b-blocker with recent bradycardia/low output for now.  - Check renal function.    2. Cardiac sarcoidosis - based on cMRI and clinical course - ACE level normal (52 U/L). CT chest. No extra-cardiac sarcoid. - Set up PET Scan at [facility] once prednisone is weaned down <10 mg.  - She is taking 25 mg prednisone instead  of 15 mg daily. For this reason we will reset the prednisone taper . Today she will cut back prednisone to 20 mg daily.  Immunosuppressive Therapy for Cardiac Sarcoid  Prednisone   Weeks 1-4  04/29/21 through 05/26/21  30 mg (three 10 mg tablets) daily   Weeks 5-8  05/27/21 through 06/23/21  25 mg (two and a half 10 mg tablets) daily   Weeks 9-12  08/04/2021 - 08/31/2021  20 mg (two 10 mg tablets) daily   Weeks 13-16    15 mg (one and a half 10 mg tablets) daily   Weeks 17-18   10 mg (two 5 mg tablets) daily   Weeks 19-20   7.5 mg (one and a half 5 mg tablets) daily   Weeks 21-21   5 mg (one 5 mg tablet) daily   Weeks 23-24   2.5 mg (half of a 5 mg tablet) daily   Week 25   Stop prednisone      Methotrexate   Weeks 1-2  04/30/21 & 05/07/21  10 mg (four 2.5 mg tablets) once weekly   *Patient missed originally scheduled increase on 05/14/21. Restarted titration as below Weeks 3-4  05/20/21 & 05/27/21  12.5 mg (five 2.5 mg tablets) once weekly   Weeks 5-6  06/03/21 & 06/10/21  15 mg (six 2.5 mg tablets) once weekly   Weeks 7-8  06/17/21 & 06/24/21  17.5 mg (seven 2.5 mg tablets) once weekly   Weeks 9+  07/01/21 & thereafter 20 mg (eight 2.5 mg tablets) once weekly      Additional Medications  Bactrim DS : 1 tablet Mon-Wed- Fri while taking  >15 mg of prednisone daily Folic Acid : 1 mg daily  Calcium Citrate -- switch to twice a day   Vitamin D: 1000 IU total daily  On protonix 40 mg daily while taking any dose of prednisone.     -Check CMET and CBC with diff at every visit.   3. PVCs/NSVT/PMVT - EP following.  - did not tolerate amiodarone previously due to worsening bradycardia - S/p ICD 02/23. - Continue amio 200 mg daily + mexiletine 200 bid for suppression.    4. Paroxysmal Atrial Flutter - S/p DCCV 2/16 -> back in AFL on 05/02/21.  - s/p TEE/DCCV 05/08/21-->NSR - Continue Eliquis 5 mg bid. No bleeding issues. - Continue amio 200 mg daily.   5. H/o AKI - GDMT as above. - Labs today.   6. Thrombocytopenia - Platelets 142>133>92 - Check CBC   7. Ingrown nail, R great toe - Resolved.   Discussed with Dr Gala Romney and Pharmacy. She will need close follow up to assure medication taper is correct.   Laderius Valbuena NP-C  2:56 PM     08/03/21

## 2021-08-03 ENCOUNTER — Other Ambulatory Visit (HOSPITAL_COMMUNITY): Payer: Medicare Other

## 2021-08-03 ENCOUNTER — Ambulatory Visit (HOSPITAL_COMMUNITY)
Admission: RE | Admit: 2021-08-03 | Discharge: 2021-08-03 | Disposition: A | Discharge status: Home / Self Care | Payer: Medicare Other | Source: Ambulatory Visit | Attending: Adult Health | Admitting: Adult Health

## 2021-08-03 ENCOUNTER — Encounter (HOSPITAL_COMMUNITY): Payer: Self-pay

## 2021-08-03 ENCOUNTER — Ambulatory Visit (INDEPENDENT_AMBULATORY_CARE_PROVIDER_SITE_OTHER): Payer: Medicare Other

## 2021-08-03 VITALS — BP 136/74 | HR 62 | Wt 183.2 lb

## 2021-08-03 DIAGNOSIS — I472 Ventricular tachycardia, unspecified: Secondary | ICD-10-CM | POA: Insufficient documentation

## 2021-08-03 DIAGNOSIS — Z7952 Long term (current) use of systemic steroids: Secondary | ICD-10-CM | POA: Diagnosis not present

## 2021-08-03 DIAGNOSIS — R0602 Shortness of breath: Secondary | ICD-10-CM | POA: Diagnosis not present

## 2021-08-03 DIAGNOSIS — I493 Ventricular premature depolarization: Secondary | ICD-10-CM | POA: Diagnosis not present

## 2021-08-03 DIAGNOSIS — E669 Obesity, unspecified: Secondary | ICD-10-CM | POA: Diagnosis not present

## 2021-08-03 DIAGNOSIS — D696 Thrombocytopenia, unspecified: Secondary | ICD-10-CM | POA: Insufficient documentation

## 2021-08-03 DIAGNOSIS — M797 Fibromyalgia: Secondary | ICD-10-CM | POA: Insufficient documentation

## 2021-08-03 DIAGNOSIS — Z79899 Other long term (current) drug therapy: Secondary | ICD-10-CM | POA: Diagnosis not present

## 2021-08-03 DIAGNOSIS — I110 Hypertensive heart disease with heart failure: Secondary | ICD-10-CM | POA: Insufficient documentation

## 2021-08-03 DIAGNOSIS — D8685 Sarcoid myocarditis: Secondary | ICD-10-CM

## 2021-08-03 DIAGNOSIS — R079 Chest pain, unspecified: Secondary | ICD-10-CM | POA: Insufficient documentation

## 2021-08-03 DIAGNOSIS — I5022 Chronic systolic (congestive) heart failure: Secondary | ICD-10-CM

## 2021-08-03 DIAGNOSIS — L600 Ingrowing nail: Secondary | ICD-10-CM | POA: Insufficient documentation

## 2021-08-03 DIAGNOSIS — I428 Other cardiomyopathies: Secondary | ICD-10-CM

## 2021-08-03 DIAGNOSIS — Z95810 Presence of automatic (implantable) cardiac defibrillator: Secondary | ICD-10-CM | POA: Insufficient documentation

## 2021-08-03 DIAGNOSIS — I5042 Chronic combined systolic (congestive) and diastolic (congestive) heart failure: Secondary | ICD-10-CM

## 2021-08-03 DIAGNOSIS — I480 Paroxysmal atrial fibrillation: Secondary | ICD-10-CM | POA: Diagnosis not present

## 2021-08-03 DIAGNOSIS — I4892 Unspecified atrial flutter: Secondary | ICD-10-CM | POA: Diagnosis not present

## 2021-08-03 DIAGNOSIS — Z7901 Long term (current) use of anticoagulants: Secondary | ICD-10-CM | POA: Insufficient documentation

## 2021-08-03 LAB — CBC WITH DIFFERENTIAL/PLATELET
Abs Immature Granulocytes: 0.19 10*3/uL — ABNORMAL HIGH (ref 0.00–0.07)
Basophils Absolute: 0 10*3/uL (ref 0.0–0.1)
Basophils Relative: 0 %
Eosinophils Absolute: 0 10*3/uL (ref 0.0–0.5)
Eosinophils Relative: 0 %
HCT: 42.7 % (ref 36.0–46.0)
Hemoglobin: 14.1 g/dL (ref 12.0–15.0)
Immature Granulocytes: 2 %
Lymphocytes Relative: 10 %
Lymphs Abs: 1 10*3/uL (ref 0.7–4.0)
MCH: 31.8 pg (ref 26.0–34.0)
MCHC: 33 g/dL (ref 30.0–36.0)
MCV: 96.4 fL (ref 80.0–100.0)
Monocytes Absolute: 0.5 10*3/uL (ref 0.1–1.0)
Monocytes Relative: 5 %
Neutro Abs: 8.3 10*3/uL — ABNORMAL HIGH (ref 1.7–7.7)
Neutrophils Relative %: 83 %
Platelets: UNDETERMINED 10*3/uL (ref 150–400)
RBC: 4.43 MIL/uL (ref 3.87–5.11)
RDW: 21.2 % — ABNORMAL HIGH (ref 11.5–15.5)
WBC: 10 10*3/uL (ref 4.0–10.5)
nRBC: 0 % (ref 0.0–0.2)

## 2021-08-03 LAB — COMPREHENSIVE METABOLIC PANEL
ALT: 27 U/L (ref 0–44)
AST: 23 U/L (ref 15–41)
Albumin: 3.7 g/dL (ref 3.5–5.0)
Alkaline Phosphatase: 63 U/L (ref 38–126)
Anion gap: 9 (ref 5–15)
BUN: 26 mg/dL — ABNORMAL HIGH (ref 8–23)
CO2: 23 mmol/L (ref 22–32)
Calcium: 9.5 mg/dL (ref 8.9–10.3)
Chloride: 106 mmol/L (ref 98–111)
Creatinine, Ser: 1.44 mg/dL — ABNORMAL HIGH (ref 0.44–1.00)
GFR, Estimated: 37 mL/min — ABNORMAL LOW (ref >60)
Glucose, Bld: 124 mg/dL — ABNORMAL HIGH (ref 70–99)
Potassium: 4.1 mmol/L (ref 3.5–5.1)
Sodium: 138 mmol/L (ref 135–145)
Total Bilirubin: 0.7 mg/dL (ref 0.3–1.2)
Total Protein: 6.4 g/dL — ABNORMAL LOW (ref 6.5–8.1)

## 2021-08-03 MED ORDER — PREDNISONE 10 MG PO TABS: 20.0000 mg | ORAL_TABLET | Freq: Every day | ORAL | 6 refills | Status: DC

## 2021-08-03 NOTE — Patient Instructions (Addendum)
Decrease Prednisone to 20 mg daily (2 tablets) Return to clinic in 3 weeks for visit with pharmacist. PLEASE BRING ALL MEDICATIONS TO THIS VISIT. Return to clinic in 6 weeks for a visit with DR Bensimhon

## 2021-08-03 NOTE — Addendum Note (Signed)
Encounter addended by: Sherald Hess, NP on: 08/03/2021 2:59 PM  Actions taken: Clinical Note Signed

## 2021-08-04 LAB — CUP PACEART REMOTE DEVICE CHECK
Battery Remaining Longevity: 63 mo
Battery Remaining Percentage: 95 %
Battery Voltage: 3.19 V
Brady Statistic AP VP Percent: 8.7 %
Brady Statistic AP VS Percent: 74 %
Brady Statistic AS VP Percent: 1 %
Brady Statistic AS VS Percent: 14 %
Brady Statistic RA Percent Paced: 77 %
Brady Statistic RV Percent Paced: 9.6 %
Date Time Interrogation Session: 20230522020016
HighPow Impedance: 79 Ohm
HighPow Impedance: 79 Ohm
Implantable Lead Implant Date: 20230220
Implantable Lead Implant Date: 20230220
Implantable Lead Location: 753859
Implantable Lead Location: 753860
Implantable Lead Model: 7122
Implantable Pulse Generator Implant Date: 20230220
Lead Channel Impedance Value: 460 Ohm
Lead Channel Impedance Value: 480 Ohm
Lead Channel Pacing Threshold Amplitude: 0.5 V
Lead Channel Pacing Threshold Amplitude: 1 V
Lead Channel Pacing Threshold Pulse Width: 0.5 ms
Lead Channel Pacing Threshold Pulse Width: 0.5 ms
Lead Channel Sensing Intrinsic Amplitude: 11.2 mV
Lead Channel Sensing Intrinsic Amplitude: 4.5 mV
Lead Channel Setting Pacing Amplitude: 3.5 V
Lead Channel Setting Pacing Amplitude: 3.5 V
Lead Channel Setting Pacing Pulse Width: 0.5 ms
Lead Channel Setting Sensing Sensitivity: 0.5 mV
Pulse Gen Serial Number: 8937527

## 2021-08-08 ENCOUNTER — Other Ambulatory Visit: Payer: Self-pay | Admitting: Critical Care Medicine

## 2021-08-12 NOTE — Progress Notes (Signed)
Advanced Heart Failure Clinic Note   Primary Care: Etta Grandchild, MD HF Cardiologist: Dr. Gala Romney   HPI: Sheri Morgan is a 76 y.o. obese woman with fibromyalgia, PAF, PVCs, HTN and systolic HF.   Previously followed by Dr. Alonza Bogus in [place] Saw Dr. Ashby Dawes in 04/2020 with recurrent AF. Echo 07/2020 with newly discovered LV dysfunction EF 30-35%. She had no follow-up here since that time.   Admitted 04/2021 with CP and worsening SOB. CT negative for dissection. Underwent cath showing only 30% stenosis of proximal LAD and elevated LVEDP EF 20-25%. ? Possible takotsubo.  Echo 20-25% moderate MR. On way to cath developed VT. Started on amiodarone and developed more bradycardia -> PMVT. She had subsequent hypotension and cardiogenic shock requiring DBA gtt. EP consulted and lidocaine gtt started with reduced PVC burden. Lido gtt weaned off and mexiletine started but was stopped. She developed AFL with RVR and underwent DCCV. Initially successful but then had recurrent AFL. Due to bradycardia and need for amiodarone, underwent placement of dual chamber ICD. Then loaded with IV amiodarone and underwent successful TEE/DCCV. She remained on amiodarone for PVCs and mexiletine restarted when PVCs not fully suppressed. cMRI suspicious for cardiac sarcoid and started on treatment protocol. GDMT limited by elevated SCr, discharged home, weight 191 lbs.   She returned for HF follow up with her daughter in law 08/03/21. Followed closely for sarcoid. Overall was feeling fine. Denied SOB/PND/Orthopnea. Appetite was ok. No fever or chills. Weight at home had been stable. Reported taking all medications. She had been taking prednisone 25 mg daily instead of 15 mg daily.   Today she returns to HF clinic for pharmacist medication titration. At last visit with NP, prednisone was decreased to 20 mg daily. Overall she is feeling well today. She does have some dizziness after taking medications in the morning. She does not  take her BP during these times specifically. Says her SBPs at home are usually 130-140. Suggested she take medications with food. No CP or palpitations. No SOB/DOE. Weight at home has been stable at 178-179 lbs. Took one dose of PRN Lasix yesterday because she had some leg swelling after eating salty food. No LEE on exam today. No PND/orthopnea. Taking all medications as prescribed and tolerating all medications.      Cardiac Sarcoidosis Medications:  Prednisone 20 mg daily (decrease to 15 mg daily 08/26/21 as below) Methotrexate 20 mg once weekly (every Thursday) Bactrim 1 DS tablet every MWF Folic acid 1 mg daily Pantoprazole 40 mg daily Vitamin D 1000 IU daily Calcium citrate 950 mg BID  HF Medications: Losartan 12.5 mg daily Farxiga 10 mg daily Furosemide 40 mg PRN Potassium chloride 20 mEq when taking furosemide  Has the patient been experiencing any side effects to the medications prescribed?   No  Does the patient have any problems obtaining medications due to transportation or finances?    BCBS Medicare - States [place] and Eliquis are expensive. Provided samples in clinic. Will start Eliquis patient assistance application.   Understanding of regimen: fair Understanding of indications: fair Potential of compliance: good Patient understands to avoid NSAIDs. Patient understands to avoid decongestants.    Pertinent Lab Values: Labs 08/03/21: Serum creatinine 1.44, BUN 26, Potassium 4.1, Sodium 138, Hgb 14.1, Plts: PLT clumps located on smear, unable to estimate  Vital Signs:  Weight: 181.4 lbs (last clinic weight: 183.2 lbs) Blood pressure: 154/82 mmHg Heart rate: 60 bpm  Assessment/Plan: 1.  Chronic systolic HF due to NICM  -  Echo (07/2020): EF 30-35% - Cath (04/2021): minimal CAD. Hs-troponin 204 -> 379 - Echo (04/2021): EF 20-25% - cMRI (04/2021): EF 11% RVEF 15% LGE pattern concerning for sarcoid. (D/w Dr. Bjorn Pippin)  - Only 2 small areas of sarcoid on cMRI. Suspect EF  down mostly due to frequent PVCs. Should recover with suppression. - NYHA II, Volume status looks ok today, weight stable. - Continue furosemide 40 mg PRN weight gain. Take potassium 20 mEq with each furosemide dose.  -Increase losartan to 25 mg daily. Eventual transition to [facility]. -Continue Farxiga 10 mg daily -Continue spironolactone 12.5 mg daily  - Holding b-blocker with recent bradycardia/low output for now. Plan to add back as able.   2. Cardiac sarcoidosis - based on cMRI and clinical course - ACE level normal (52 U/L). CT chest. No extra-cardiac sarcoid. - Set [facility] at [facility] once prednisone is weaned down <10 mg.  -Decrease prednisone to 15 mg daily. Plan to decrease to 10 mg daily on 09/23/21 at clinic visit with Dr. Gala Romney. Will need prednisone 5 mg tablet strength sent in at that time to continue titration. See prednisone taper plan below:  Weeks 9-12 08/04/21 through 08/25/21 20 mg (two 10 mg tablets) daily  Weeks 13-16 08/26/21 through 09/23/21 15 mg (one and a half 10 mg tablets) daily  Weeks 17-18 09/24/21 through 10/07/21 10 mg (two 5 mg tablets) daily Stop taking Bactrim on 09/24/21 (start of week 17)  Weeks 19-20 10/08/21 through 10/21/21 7.5 mg (one and a half 5 mg tablets) daily  Weeks 21-22 10/22/21 through 11/04/21 5 mg (one 5 mg tablet) daily  Weeks 23-24 11/05/21 through 11/18/21 2.5 mg (half of a 5 mg tablet) daily  Week 25 11/19/21 Stop prednisone   -Continue methotrexate 20 mg (8 tablets) weekly on Thursday per titration protocol -Continue Bactrim DS 1 tablet Mon-Wed- Fri while taking >15 mg of prednisone daily (end date 09/24/21) -Continue Folic Acid 1 mg daily  -Continue Calcium Citrate twice daily  -Continue vitamin D 1000 IU total daily  -Continue protonix 40 mg daily while on prednisone per titration protocol   3. PVCs/NSVT/PMVT - EP following.  - did not tolerate amiodarone previously due to worsening bradycardia - S/p ICD 04/2021. - Continue amiodarone 200  mg daily + mexiletine 200 mg BID for suppression.    4. Paroxysmal Atrial Flutter - S/p DCCV 2/16 -> back in AFL on 05/02/2021.  - s/p TEE/DCCV 05/08/2021-->NSR - Continue Eliquis 5 mg BID. No bleeding issues. - Continue amiodarone 200 mg daily.   5. H/o AKI - GDMT as above.   6. Thrombocytopenia - Platelets 142>133>92  - No bleeding issues. - May be due to MTX. Will need to follow closely.   7. Hypothyroidism -Continue levothyroxine 200 mcg daily  Follow up 4 weeks with Dr. Elyse Jarvis, PharmD, BCPS, [facility], CPP Heart Failure Clinic Pharmacist [phone]

## 2021-08-13 ENCOUNTER — Other Ambulatory Visit: Payer: Self-pay | Admitting: Critical Care Medicine

## 2021-08-13 DIAGNOSIS — E039 Hypothyroidism, unspecified: Secondary | ICD-10-CM

## 2021-08-13 NOTE — Telephone Encounter (Signed)
Medication Refill - Medication: levothyroxine (SYNTHROID) 200 MCG tablet   Has the patient contacted their pharmacy? Yes. Pt stated pharmacy was supposed to reach out to [place].   (Agent: If yes, when and what did the pharmacy advise?)  Preferred Pharmacy (with phone number or street name):  Has the patient been seen for an appointment in the last year [id number] [place], [place] - [address] AT [facility] [address]  [address] [place] [address]  Phone: [phone] Fax: [phone]  Hours: Not open 24 hours  R does the patient have an upcoming appointment? Yes.    Agent: Please be advised that RX refills may take up to 3 business days. We ask that you follow-up with your pharmacy.

## 2021-08-14 MED ORDER — LEVOTHYROXINE SODIUM 200 MCG PO TABS: 200.0000 ug | ORAL_TABLET | Freq: Every day | ORAL | 2 refills | Status: DC

## 2021-08-14 NOTE — Telephone Encounter (Signed)
Sending to different pharmacy. Requested Prescriptions  Pending Prescriptions Disp Refills  . levothyroxine (SYNTHROID) 200 MCG tablet 90 tablet 2    Sig: Take 1 tablet (200 mcg total) by mouth daily before breakfast. Take 1 tablet (200 mcg total) by mouth daily before breakfast. Further refills must be from PCP.     Endocrinology:  Hypothyroid Agents Failed - 08/14/2021  8:59 AM      Failed - TSH in normal range and within 360 days    TSH  Date Value Ref Range Status  07/13/2021 0.057 (L) 0.450 - 4.500 uIU/mL Final         Passed - Valid encounter within last 12 months    Recent Outpatient Visits          1 month ago Ingrown toenail   [facility] Storm Frisk, MD      Future Appointments            In 1 month Delford Field Charlcie Cradle, MD [facility]

## 2021-08-17 ENCOUNTER — Other Ambulatory Visit (HOSPITAL_COMMUNITY): Payer: Self-pay | Admitting: Physician Assistant

## 2021-08-19 NOTE — Progress Notes (Signed)
Remote ICD transmission.   

## 2021-08-25 ENCOUNTER — Other Ambulatory Visit (HOSPITAL_COMMUNITY): Payer: Self-pay

## 2021-08-25 ENCOUNTER — Telehealth (HOSPITAL_COMMUNITY): Payer: Self-pay | Admitting: Pharmacist

## 2021-08-25 ENCOUNTER — Ambulatory Visit (HOSPITAL_COMMUNITY)
Admission: RE | Admit: 2021-08-25 | Discharge: 2021-08-25 | Disposition: A | Discharge status: Home / Self Care | Payer: Medicare Other | Source: Ambulatory Visit | Attending: Internal Medicine | Admitting: Internal Medicine

## 2021-08-25 DIAGNOSIS — Z7952 Long term (current) use of systemic steroids: Secondary | ICD-10-CM | POA: Insufficient documentation

## 2021-08-25 DIAGNOSIS — I493 Ventricular premature depolarization: Secondary | ICD-10-CM | POA: Diagnosis not present

## 2021-08-25 DIAGNOSIS — I428 Other cardiomyopathies: Secondary | ICD-10-CM | POA: Diagnosis not present

## 2021-08-25 DIAGNOSIS — M797 Fibromyalgia: Secondary | ICD-10-CM | POA: Diagnosis not present

## 2021-08-25 DIAGNOSIS — Z79899 Other long term (current) drug therapy: Secondary | ICD-10-CM | POA: Insufficient documentation

## 2021-08-25 DIAGNOSIS — Z79811 Long term (current) use of aromatase inhibitors: Secondary | ICD-10-CM | POA: Insufficient documentation

## 2021-08-25 DIAGNOSIS — I5022 Chronic systolic (congestive) heart failure: Secondary | ICD-10-CM | POA: Diagnosis not present

## 2021-08-25 DIAGNOSIS — Z792 Long term (current) use of antibiotics: Secondary | ICD-10-CM | POA: Diagnosis not present

## 2021-08-25 DIAGNOSIS — E669 Obesity, unspecified: Secondary | ICD-10-CM | POA: Insufficient documentation

## 2021-08-25 DIAGNOSIS — I4892 Unspecified atrial flutter: Secondary | ICD-10-CM | POA: Diagnosis not present

## 2021-08-25 DIAGNOSIS — D696 Thrombocytopenia, unspecified: Secondary | ICD-10-CM | POA: Diagnosis not present

## 2021-08-25 DIAGNOSIS — I110 Hypertensive heart disease with heart failure: Secondary | ICD-10-CM | POA: Diagnosis not present

## 2021-08-25 DIAGNOSIS — Z7901 Long term (current) use of anticoagulants: Secondary | ICD-10-CM | POA: Diagnosis not present

## 2021-08-25 DIAGNOSIS — Z95810 Presence of automatic (implantable) cardiac defibrillator: Secondary | ICD-10-CM | POA: Insufficient documentation

## 2021-08-25 DIAGNOSIS — D8689 Sarcoidosis of other sites: Secondary | ICD-10-CM | POA: Diagnosis not present

## 2021-08-25 DIAGNOSIS — E039 Hypothyroidism, unspecified: Secondary | ICD-10-CM | POA: Diagnosis not present

## 2021-08-25 MED ORDER — PREDNISONE 10 MG PO TABS: 15.0000 mg | ORAL_TABLET | Freq: Every day | ORAL | 1 refills | Status: DC

## 2021-08-25 MED ORDER — DAPAGLIFLOZIN PROPANEDIOL 10 MG PO TABS: 10.0000 mg | ORAL_TABLET | Freq: Every day | ORAL | 3 refills | Status: DC

## 2021-08-25 MED ORDER — LOSARTAN POTASSIUM 25 MG PO TABS: 25.0000 mg | ORAL_TABLET | Freq: Every day | ORAL | 3 refills | Status: DC

## 2021-08-25 NOTE — Telephone Encounter (Signed)
Patient Advocate Encounter  Was successful in securing patient an $10,000 grant from [employer] to provide copayment coverage for [place].  This will keep the out of pocket expense at $0.     I have spoken with the patient.    The billing information is as follows and has been shared with [facility].   Member ID: [id number] Group ID: [id number] RxBin: [id number] PCN: [id number] Dates of Eligibility: 07/26/21 through 07/26/22    Karle Plumber, PharmD, BCPS, BCCP, CPP Heart Failure Clinic Pharmacist [phone]

## 2021-08-25 NOTE — Patient Instructions (Addendum)
It was a pleasure seeing you today!  Medication Changes: - Increase losartan to 25 mg (1 tablet) daily.  - Decrease Prednisone to 15 mg (1.5 tablets) daily (follow prednisone taper below).  Prednisone Taper plan: follow these instructions, not what is on the medication bottle.  Weeks 9-12 08/04/21 through 08/25/21 20 mg (two 10 mg tablets) daily  Weeks 13-16 08/26/21 through 09/23/21 15 mg (one and a half 10 mg tablets) daily  Weeks 17-18 09/24/21 through 10/07/21 10 mg (two 5 mg tablets) daily Stop taking Bactrim on 09/24/21 (start of  week 17)  Weeks 19-20 10/08/21 through 10/21/21 7.5 mg (one and a half 5 mg tablets) daily  Weeks 21-22 10/22/21 through 11/04/21 5 mg (one 5 mg tablet) daily  Weeks 23-24 11/05/21 through 11/18/21 2.5 mg (half of a 5 mg tablet) daily  Week 25 11/19/21 Stop prednisone    NEXT APPOINTMENT: Return to clinic in 1 month with Dr. Haroldine Laws.  In general, to take care of your heart failure: -Limit your fluid intake to 2 Liters (half-gallon) per day.   -Limit your salt intake to ideally 2-3 grams (2000-3000 mg) per day. -Weigh yourself daily and record, and bring that "weight diary" to your next appointment.  (Weight gain of 2-3 pounds in 1 day typically means fluid weight.) -The medications for your heart are to help your heart and help you live longer.   -Please contact us before stopping any of your heart medications.  Call the clinic at [phone] with questions or to reschedule future appointments.

## 2021-08-25 NOTE — Telephone Encounter (Signed)
Medication Samples have been provided to the patient.   Drug name: Marcelline Deist     Strength: 10 mg      Qty: 4 boxes                 LOT: [id number] Exp.Date: 01/13/24   Dosing instructions: 1 tablet daily  Drug name: Eliquis     Strength: 5 mg      Qty: 3 boxes                 LOT: [id number] Exp.Date: 06/2023   Dosing instructions: 1 tablet twice daily   The patient has been instructed regarding the correct time, dose, and frequency of taking this medication, including desired effects and most common side effects.   Karle Plumber, PharmD, BCPS, CPP [facility] Pharmacist [phone]

## 2021-08-26 ENCOUNTER — Encounter: Payer: Medicare Other | Admitting: Internal Medicine

## 2021-08-26 ENCOUNTER — Telehealth (HOSPITAL_COMMUNITY): Payer: Self-pay | Admitting: Pharmacist

## 2021-08-26 NOTE — Telephone Encounter (Signed)
Sent in Manufacturer's Assistance application to [employer] for Eliquis. Of note, application does not contain the out of pocket expense report. Application will initially be denied. Patient is aware to bring documentation into clinic so it can be faxed.    Application pending, will continue to follow.   Karle Plumber, PharmD, BCPS, BCCP, CPP [facility] Pharmacist [phone]

## 2021-08-27 NOTE — Telephone Encounter (Signed)
Received communication from [employer] that OOP is required. Patient is aware.

## 2021-09-15 NOTE — Progress Notes (Unsigned)
New Patient Office Visit  Subjective    Patient ID: AMBOR HRNCIR, adult    DOB: [birth year]  Age: 76 y.o. MRN: [id number]  CC:  No chief complaint on file.   HPI 07/13/21 Sheri Morgan presents to establish care The patient is accompanied by her daughter-in-law who is a oncology nurse her name is Tamika This patient is a former primary care patient [facility].  Patient is transferring care to our clinic.  Note she had been admitted in February of this year with new onset heart failure cardiogenic shock atrial flutter.  Found to have cardiac sarcoidosis on evaluations.  She is on high-dose steroids and methotrexate to treat the cardiac sarcoidosis.  She does not have any other extracardiac findings of sarcoid.  Below is a copy of the discharge summary.   Patient ID: RAIAH CIANCI MRN: [id number], DOB/AGE: [birth year] 76 y.o. Admit date: 04/23/2021 D/C date:     05/11/2021    Primary Discharge Diagnoses:  Acute on Chronic Systolic CHF Cardiogenic Shock - Resolved Non-Ischemic Cardiomyopathy Cardiac Sarcoidosis Non-Sustained VT/ PMVT/ PVCs Paroxysmal Atrial Flutter S/p ICD AKI on CKD 3b     Hospital Course:  Ms. Laro is a 76 year old obese female with a history of chronic systolic CHF with EF of 30-35%, paroxysmal atrial fibrillation not compliant with Eliquis prior to admission, PVCs, hypertension, and fibromyalgia. She was previously seen by Dr. Alonza Bogus in [place] and then established with Dr. Jacques Navy in 04/2020 for recurrent atrial fibrillation. Echo in 07/2020 showed LVEF of 30-35% which was new. Patient was lost to follow-up after this.   Patient was admitted on 04/23/2021 with chest pain and worsening shortness of breath. Labs were notable for high-sensitivity troponin of 204 >> 379 and BNP of 379.1. CTA was negative for aortic dissection. She underwent cardiac catheterization on 2/9 which showed mild non-obstructive CAD with only 30% stenosis of proximal LAD and  elevated LVEDP. Echo showed LVEF of 20-25% with global hypokinesis, mildly enlarged RV with moderately reduced systolic function, mild to moderate MR, and moderately elevated RVSP of 54.0 mmHg.    Developed VT and was started on Amiodarone but had more bradycardia >> PMVT (brady mediated). This was complicated by hypotension and cardiogenic shock requiring Dobutamine which was weaned off on 2/14. EP was consulted and she was started on Lidocaine with reduced PVC burden but this was later stopped due to nausea/vomiting. Subsequently, she was started on Mexiletine but this was ultimately stopped as well. She then developed atrial flutter with RVR on 2/14. She underwent DCCV on 2/16 which was initially successful but then she had recurrent atrial flutter on 2/18. Due to bradycardia and need for Amiodarone, she underwent placement of dual chamber ICD on 2/20. She was then loaded with IV Amiodarone and underwent repeat TEE/DCCV on 2/24. No recurrent atrial arrhythmias prior to discharge. Kept on amiodarone for PVCs and mexiletine restarted when PVCs not fully suppressed.   CMRI raised concern for cardiac sarcoidosis. Started on treatment per PharmD protocol (prednisone + methotrexate + bactrim/folic acid/calcium citrate/vitaminD, protonix).   She will follow-up with PharmD for CHF and sarcoid medication titration 05/20/21. Follow-up with HF APP clinic 05/27/21.   Scheduled for follow-up in device clinic for wound check on 03/02.   Seen by PT/OT. No needs identified.     Hospital Course by Problem: 1.  Acute on chronic systolic HF due to NICM -> cardiogenic shock. Suspect cardiac sarcoid based on cardiac MRI.  - 5/22 ECHO  EF 30-35% - Cath 2/23 minimal CAD. hstroponin 204 -> 379 - Echo 2/23: EF 20-25% - cMRI EF 11% RVEF 15% LGE pattern concerning for sarcoid. (D/w Dr. Gardiner Rhyme)  - Only 2 small areas of sarcoid on cMRI. Suspect EF down mostly due to frequent PVCs. Should recover with suppression -  Initially on DBA for support. Now off - Diuresed with intermittent IV lasix. Last dose 02/25. Volume stable today. PRN lasix at discharge. - Continue Farxiga 10 - Off Delene Loll and Arlyce Harman on hold w/ soft BP and AKI/hyperkalemia - Holding b-blocker with bradycardia/low output - Would like to add losartan 12.5 if K remains stable. Will wait until clinic    2. Cardiac sarcoidosis - based on cMRI and clinical course - ACE level normal (52 U/L). CT chest. No extra-cardiac sarcoid Immunosuppressive Therapy for Cardiac Sarcoid  Prednisone (04/29/21) Weeks 1-4: 30 mg daily  Methotrexate  (04/30/21 & 2/23/223) Weeks 1&2: 10 mg (four 2.5 mg tablets) once weekly  Weeks 3&4 starting 05/14/21 : 12.5 mg (five 2.5 mg tablets) once weekly Weeks 5&6: 15 mg (six 2.5 mg tablets) once weekly  Weeks 7&8: 17.5 mg (seven 2.5 mg tablets) once weekly Weeks 9+:  20 mg (eight 2.5 mg tablets ) once weekly  Additional Medications  Bactrim DS : 1 tablet Mon-Wed- Fri while taking >15 mg of prednisone daily  Folic Acid : 1 mg daily  Calcium Citrate -- switch to twice a day   Vitamin D: 1000 IU total daily  On protonix 40 mg daily while taking any dose of prednisone.      3. PVCs/NSVT/PMVT - EP following.  - did not tolerate amiodarone previously due to worsening bradycardia - off lido due to n/v that said PVC burden much decreased with lido - mexiletine discontinued 2/21 per EP - S/p ICD 02/23 - On po amio 200 mg daily.  - Added back mexiletine 200 mg BID on 02/26 - PVC burden improved, down to 2-5/min   4. Paroxysmal Atrial Flutter - S/p DCCV 2/16 -> back in [facility] on 2/18.  - Continue Eliquis  - s/p TEE/DCCV t2/24  - Apaced today - Continue amio   4. Hypokalemia/hyperkalemia - Stable today   5. AKI - SCr 1.47>>1.60>>1.74> 1.35 -> 1.6>1.45 - BP ok  - continue to hold Entresto and Arlyce Harman  - encouraged PO intake      Discharge Weight Range: 202 lb >>191 lb   Discharge Vitals: Blood pressure 124/85, pulse  68, temperature (!) 97.4 F (36.3 C), temperature source Oral, resp. rate 17, height 5\' 3"  (1.6 m), weight 87 kg, SpO2 98 %.  1 ASSESSMENT & PLAN: 1.  Chronic systolic HF due to NICM  - Echo (5/22): EF 30-35% - Cath (2/23): minimal CAD. Hs-troponin 204 -> 379 - Echo (2/23): EF 20-25% - cMRI (2/23): EF 11% RVEF 15% LGE pattern concerning for sarcoid. (D/w Dr. Gardiner Rhyme)  - Only 2 small areas of sarcoid on cMRI. Suspect EF down mostly due to frequent PVCs. Should recover with suppression. - NYHA II, Volume status looks good today, weight stable. - Start losartan 12.5 mg daily. Eventual transition to [facility]. Check BP at home and bring log to next visit. - Continue Farxiga 10 mg daily. - Add spiro next if labs stable. - Holding b-blocker with recent bradycardia/low output for now. Plan to add back as able. - Labs today, BMET in 10 days.   2. Cardiac sarcoidosis - based on cMRI and clinical course - ACE level normal (52 U/L).  CT chest. No extra-cardiac sarcoid. Immunosuppressive Therapy for Cardiac Sarcoid  Prednisone (04/29/21) Weeks 1-4: 30 mg daily  Methotrexate  (04/30/21 & 2/23/223) Weeks 1&2: 10 mg (four 2.5 mg tablets) once weekly  Weeks 3&4 starting 05/14/21 : 12.5 mg (five 2.5 mg tablets) once weekly Weeks 5&6: 15 mg (six 2.5 mg tablets) once weekly  Weeks 7&8: 17.5 mg (seven 2.5 mg tablets) once weekly Weeks 9+:  20 mg (eight 2.5 mg tablets ) once weekly  Additional Medications  Bactrim DS : 1 tablet Mon-Wed- Fri while taking >15 mg of prednisone daily  Folic Acid : 1 mg daily  Calcium Citrate -- switch to twice a day   Vitamin D: 1000 IU total daily  On protonix 40 mg daily while taking any dose of prednisone.    - Protocol labs today.   3. PVCs/NSVT/PMVT - EP following.  - did not tolerate amiodarone previously due to worsening bradycardia - S/p ICD 02/23. - Continue amio 200 mg daily + mexiletine 200 bid for suppression.    4. Paroxysmal Atrial Flutter - S/p DCCV 2/16  -> back in [facility] on 05/02/21.  - s/p TEE/DCCV 05/08/21-->NSR - Continue Eliquis 5 mg bid. No bleeding issues. - Continue amio 200 mg daily.   5. H/o AKI - GDMT as above. - Labs today.   6. Thrombocytopenia - Platelets 142>133>92 - No bleeding issues. - May be due to MTX. Will need to follow closely. - CBC today.    7. Ingrown nail, R great toe - Does not look infected, but needs PCP or Podiatry follow up. - Given list of PCPs to establish care   Follow up in 4 weeks with PharmD (for 2 visits for GDMT/sarcoid protocol titration), 8-10 weeks with APP and 4 months with Dr. Haroldine Laws. Handicap form filled out today per patient's request.  The patient has had her follow-up with the APP and the Pharm.D. and above is the documentations.  She does complain of ingrown toenail in the right great toe.  She also complains of tremors in the hand.  She is on 200 mcg of thyroid supplement and this has not been measured recently with regards to thyroid function.  The patient also has complaints of onychomycosis in the toenails.  The patient is interested in obtaining a podiatrist.  She is due a pneumonia vaccine she agrees to receive that at this visit.  Note blood pressure is elevated on arrival 151/86.  She is on a variety of medications for her cardiac status.  This is all been managed by the Pharm.D. at heart failure clinic  There are no other complaints.  7/5  CHF clinic 5/22 per [facility] NP ASSESSMENT & PLAN: 1.  Chronic systolic HF due to NICM  - Echo (5/22): EF 30-35% - Cath (2/23): minimal CAD. Hs-troponin 204 -> 379 - Echo (2/23): EF 20-25% - cMRI (2/23): EF 11% RVEF 15% LGE pattern concerning for sarcoid. (D/w Dr. Gardiner Rhyme)  - NYHA II. Volume status stable. Continue lasix as needed.  - Continue losartan  - Continue spiro  -Continue farxiga - Holding b-blocker with recent bradycardia/low output for now.  - Check renal function.    2. Cardiac sarcoidosis - based on cMRI and clinical  course - ACE level normal (52 U/L). CT chest. No extra-cardiac sarcoid. - Set up PET Scan at [facility] once prednisone is weaned down <10 mg.  - She is taking 25 mg prednisone instead  of 15 mg daily. For this reason we will reset the  prednisone taper . Today she will cut back prednisone to 20 mg daily.  Immunosuppressive Therapy for Cardiac Sarcoid  Prednisone   Weeks 1-4  04/29/21 through 05/26/21  30 mg (three 10 mg tablets) daily   Weeks 5-8  05/27/21 through 06/23/21  25 mg (two and a half 10 mg tablets) daily   Weeks 9-12  08/04/2021 - 08/31/2021  20 mg (two 10 mg tablets) daily   Weeks 13-16    15 mg (one and a half 10 mg tablets) daily   Weeks 17-18    10 mg (two 5 mg tablets) daily   Weeks 19-20    7.5 mg (one and a half 5 mg tablets) daily   Weeks 21-21    5 mg (one 5 mg tablet) daily   Weeks 23-24    2.5 mg (half of a 5 mg tablet) daily   Week 25    Stop prednisone      Methotrexate   Weeks 1-2  04/30/21 & 05/07/21  10 mg (four 2.5 mg tablets) once weekly   *Patient missed originally scheduled increase on 05/14/21. Restarted titration as below Weeks 3-4  05/20/21 & 05/27/21  12.5 mg (five 2.5 mg tablets) once weekly   Weeks 5-6  06/03/21 & 06/10/21  15 mg (six 2.5 mg tablets) once weekly   Weeks 7-8  06/17/21 & 06/24/21  17.5 mg (seven 2.5 mg tablets) once weekly   Weeks 9+  07/01/21 & thereafter 20 mg (eight 2.5 mg tablets) once weekly       Additional Medications  Bactrim DS : 1 tablet Mon-Wed- Fri while taking >15 mg of prednisone daily Folic Acid : 1 mg daily  Calcium Citrate -- switch to twice a day   Vitamin D: 1000 IU total daily  On protonix 40 mg daily while taking any dose of prednisone.      -Check CMET and CBC with diff at every visit.    3. PVCs/NSVT/PMVT - EP following.  - did not tolerate amiodarone previously due to worsening bradycardia - S/p ICD 02/23. - Continue amio 200 mg daily + mexiletine 200 bid for suppression.    4. Paroxysmal Atrial Flutter - S/p DCCV 2/16 ->  back in AFL on 05/02/21.  - s/p TEE/DCCV 05/08/21-->NSR - Continue Eliquis 5 mg bid. No bleeding issues. - Continue amio 200 mg daily.   5. H/o AKI - GDMT as above. - Labs today.   6. Thrombocytopenia - Platelets 142>133>92 - Check CBC    7. Ingrown nail, R great toe - Resolved.    Discussed with Dr Haroldine Laws and Pharmacy. She will need close follow up to assure medication taper is correct.       Cardiovascular and Mediastinum   Longstanding persistent atrial fibrillation (HCC)    Chronic atrial fibrillation with rate control Continue Eliquis       Primary hypertension    Hypertension persists however management is per cardiology  Will monitor for now       NSTEMI (non-ST elevated myocardial infarction) ([place])    Higher NSTEMI management per cardiology       NICM (nonischemic cardiomyopathy) ([place])    Management per cardiology patient does have an ICD       Chronic combined systolic and diastolic heart failure ([place])    Management per heart failure clinic       Cardiac sarcoidosis    MRI findings consistent with cardiac sarcoidosis prednisone and methotrexate per cardiology  Endocrine   Acquired hypothyroidism    With resting tremor need to assess thyroid function this will be sent       Relevant Orders   Thyroid Panel With TSH     Nervous and Auditory   Stenosis of cervical spine with myelopathy (HCC)    Currently stable this time we will monitor        Musculoskeletal and Integument   Ingrown nail of great toe of right foot    Referral to podiatry was made         Genitourinary   Stage 3a chronic kidney disease (HCC)    Improvement in chronic renal insufficiency will monitor         Other   Prediabetes    A1c of 6.0 is on Farxiga for heart failure       Hyperlipidemia LDL goal <130    Continue cholesterol therapy per cardiology       Severe obesity (BMI 35.0-39.9) with comorbidity (HCC)    Recommended healthy  plant-based diet       Other Visit Diagnoses     Ingrown toenail    -  Primary   Relevant Orders   Ambulatory referral to Podiatry   Need for Streptococcus pneumoniae vaccination       Relevant Orders   Pneumococcal conjugate vaccine 20-valent (Completed)   Outpatient Encounter Medications as of 09/16/2021  Medication Sig   amiodarone (PACERONE) 200 MG tablet Take 1 tablet (200 mg total) by mouth daily.   apixaban (ELIQUIS) 5 MG TABS tablet Take 1 tablet (5 mg total) by mouth 2 (two) times daily.   calcium citrate (CALCITRATE - DOSED IN MG ELEMENTAL CALCIUM) 950 (200 Ca) MG tablet Take 1 tablet (200 mg of elemental calcium total) by mouth 2 (two) times daily.   ciclopirox (PENLAC) 8 % solution PLEASE SEE ATTACHED FOR DETAILED DIRECTIONS   dapagliflozin propanediol (FARXIGA) 10 MG TABS tablet Take 1 tablet (10 mg total) by mouth daily.   folic acid (FOLVITE) 1 MG tablet Take 1 tablet (1 mg total) by mouth daily.   furosemide (LASIX) 40 MG tablet Take 1 tablet as needed for weight gain > 3 lb in a day or 5 lb in a week, leg edema, shortness of breath   levothyroxine (SYNTHROID) 200 MCG tablet Take 1 tablet (200 mcg total) by mouth daily before breakfast. Take 1 tablet (200 mcg total) by mouth daily before breakfast. Further refills must be from PCP.   losartan (COZAAR) 25 MG tablet Take 1 tablet (25 mg total) by mouth daily.   methotrexate (RHEUMATREX) 2.5 MG tablet TAKE 8 TABLETS BY MOUTH EVERY THURSDAY (Patient taking differently: TAKE 8 TABLETS BY MOUTH EVERY Friday.)   mexiletine (MEXITIL) 200 MG capsule Take 1 capsule (200 mg total) by mouth 2 (two) times daily.   pantoprazole (PROTONIX) 40 MG tablet Take 1 tablet (40 mg total) by mouth daily.   potassium chloride SA (KLOR-CON M) 20 MEQ tablet Take 1 tablet (20 mEq total) by mouth when taking furosemide   prednisoLONE acetate (PRED FORTE) 1 % ophthalmic suspension Place 1 drop into the left eye every evening.   predniSONE (DELTASONE)  10 MG tablet Take 1.5 tablets (15 mg total) by mouth daily with breakfast.   rosuvastatin (CRESTOR) 40 MG tablet TAKE 1 TABLET BY MOUTH EVERY DAY   spironolactone (ALDACTONE) 25 MG tablet Take 0.5 tablets (12.5 mg total) by mouth daily.   sulfamethoxazole-trimethoprim (BACTRIM DS) 800-160 MG tablet Take 1 tablet by  mouth every Monday, Wednesday, and Friday.   Vitamin D3 (VITAMIN D) 25 MCG tablet Take 1 tablet (1,000 Units total) by mouth daily.   No facility-administered encounter medications on file as of 09/16/2021.    Past Medical History:  Diagnosis Date   Anxiety    Atypical atrial flutter (HCC)    Cardiac sarcoidosis    Chronic combined systolic and diastolic CHF (congestive heart failure) (HCC)    Fibromyalgia    Hypertension    Non-ischemic cardiomyopathy (HCC)    LHC 04/23/2021: 30% stenosis of proximal LAD   Paroxysmal atrial fibrillation (HCC)    Premature atrial contractions    Premature ventricular contraction    S/P ICD (internal cardiac defibrillator) procedure    Tachy-brady syndrome (HCC)    VT (ventricular tachycardia) (HCC)     Past Surgical History:  Procedure Laterality Date   ABDOMINAL HYSTERECTOMY     CARDIOVERSION N/A 04/30/2021   Procedure: CARDIOVERSION;  Surgeon: Dolores Patty, MD;  Location: [facility] ENDOSCOPY;  Service: Cardiovascular;  Laterality: N/A;   CARDIOVERSION N/A 05/08/2021   Procedure: CARDIOVERSION;  Surgeon: Dolores Patty, MD;  Location: [facility] ENDOSCOPY;  Service: Cardiovascular;  Laterality: N/A;   ICD IMPLANT N/A 05/04/2021   Procedure: ICD IMPLANT;  Surgeon: Marinus Maw, MD;  Location: [facility] INVASIVE CV LAB;  Service: Cardiovascular;  Laterality: N/A;   LEFT HEART CATH AND CORONARY ANGIOGRAPHY N/A 04/23/2021   Procedure: LEFT HEART CATH AND CORONARY ANGIOGRAPHY;  Surgeon: Kathleene Hazel, MD;  Location: [facility] INVASIVE CV LAB;  Service: Cardiovascular;  Laterality: N/A;   LUMBAR LAMINECTOMY     TEE WITHOUT CARDIOVERSION N/A 05/08/2021    Procedure: TRANSESOPHAGEAL ECHOCARDIOGRAM (TEE);  Surgeon: Dolores Patty, MD;  Location: [facility] ENDOSCOPY;  Service: Cardiovascular;  Laterality: N/A;   THYROIDECTOMY      Family History  Problem Relation Age of Onset   Diabetes Mother    Alcoholism Father     Social History   Socioeconomic History   Marital status: Divorced    Spouse name: Not on file   Number of children: Not on file   Years of education: Not on file   Highest education level: Not on file  Occupational History   Not on file  Tobacco Use   Smoking status: Never   Smokeless tobacco: Never  Vaping Use   Vaping Use: Never used  Substance and Sexual Activity   Alcohol use: No   Drug use: No   Sexual activity: Never  Other Topics Concern   Not on file  Social History Narrative   Retired from [employer]   Social Determinants of Health   Financial Resource Strain: Low Risk  (11/13/2020)   Overall Financial Resource Strain (CARDIA)    Difficulty of Paying Living Expenses: Not hard at all  Food Insecurity: No Food Insecurity (11/13/2020)   Hunger Vital Sign    Worried About Running Out of Food in the Last Year: Never true    Ran Out of Food in the Last Year: Never true  Transportation Needs: No Transportation Needs (11/13/2020)   PRAPARE - Administrator, Civil Service (Medical): No    Lack of Transportation (Non-Medical): No  Physical Activity: Insufficiently Active (11/13/2020)   Exercise Vital Sign    Days of Exercise per Week: 1 day    Minutes of Exercise per Session: 30 min  Stress: No Stress Concern Present (11/13/2020)   [employer] of Occupational Health - Occupational Stress Questionnaire    Feeling  of Stress : Not at all  Social Connections: Moderately Integrated (11/13/2020)   Social Connection and Isolation Panel [NHANES]    Frequency of Communication with Friends and Family: More than three times a week    Frequency of Social Gatherings with Friends and Family: Not on file    Attends  Religious Services: More than 4 times per year    Active Member of [employer] or Organizations: Yes    Attends Archivist Meetings: Never    Marital Status: Divorced  Human resources officer Violence: Not At Risk (11/13/2020)   Humiliation, Afraid, Rape, and Kick questionnaire    Fear of Current or Ex-Partner: No    Emotionally Abused: No    Physically Abused: No    Sexually Abused: No    Review of Systems  Constitutional:  Negative for chills, diaphoresis, fever, malaise/fatigue and weight loss.  HENT:  Negative for congestion, hearing loss, nosebleeds, sore throat and tinnitus.   Eyes:  Negative for blurred vision, photophobia and redness.  Respiratory:  Positive for shortness of breath. Negative for cough, hemoptysis, sputum production, wheezing and stridor.   Cardiovascular:  Negative for chest pain, palpitations, orthopnea, claudication, leg swelling and PND.  Gastrointestinal:  Negative for abdominal pain, blood in stool, constipation, diarrhea, heartburn, nausea and vomiting.  Genitourinary:  Negative for dysuria, flank pain, frequency, hematuria and urgency.  Musculoskeletal:  Negative for back pain, falls, joint pain, myalgias and neck pain.       Right great toe pain due to ingrown toenail  Skin:  Negative for itching and rash.  Neurological:  Positive for tremors. Negative for dizziness, tingling, sensory change, speech change, focal weakness, seizures, loss of consciousness, weakness and headaches.       Hands  Endo/Heme/Allergies:  Negative for environmental allergies and polydipsia. Does not bruise/bleed easily.  Psychiatric/Behavioral:  Negative for depression, hallucinations, memory loss, substance abuse and suicidal ideas. The patient is not nervous/anxious and does not have insomnia.         Objective    There were no vitals taken for this visit.  Physical Exam Vitals reviewed.  Constitutional:      Appearance: Normal appearance. KHIANNA BROADWELL is well-developed.  CAYLYNN BROOKING is obese. LASONIA CASAS is not diaphoretic.  HENT:     Head: Normocephalic and atraumatic.     Nose: Nose normal. No nasal deformity, septal deviation, mucosal edema or rhinorrhea.     Right Sinus: No maxillary sinus tenderness or frontal sinus tenderness.     Left Sinus: No maxillary sinus tenderness or frontal sinus tenderness.     Mouth/Throat:     Mouth: Mucous membranes are moist.     Pharynx: Oropharynx is clear. No oropharyngeal exudate.     Comments: Poor dentition with dental caries Eyes:     General: No scleral icterus.    Conjunctiva/sclera: Conjunctivae normal.     Pupils: Pupils are equal, round, and reactive to light.  Neck:     Thyroid: No thyromegaly.     Vascular: No carotid bruit or JVD.     Trachea: Trachea normal. No tracheal tenderness or tracheal deviation.  Cardiovascular:     Rate and Rhythm: Normal rate and regular rhythm.     Chest Wall: PMI is not displaced.     Pulses: Normal pulses. No decreased pulses.     Heart sounds: Normal heart sounds, S1 normal and S2 normal. Heart sounds not distant. No murmur heard.    No systolic murmur is present.  No diastolic murmur is present.     No friction rub. No gallop. No S3 or S4 sounds.  Pulmonary:     Effort: No tachypnea, accessory muscle usage or respiratory distress.     Breath sounds: No stridor. No decreased breath sounds, wheezing, rhonchi or rales.  Chest:     Chest wall: No tenderness.  Abdominal:     General: Bowel sounds are normal. There is no distension.     Palpations: Abdomen is soft. Abdomen is not rigid.     Tenderness: There is no abdominal tenderness. There is no guarding or rebound.  Musculoskeletal:        General: Normal range of motion.     Cervical back: Normal range of motion and neck supple. No edema, erythema or rigidity. No muscular tenderness. Normal range of motion.     Comments: Tender right great toe without evidence of infection but with ingrown toenail also  onychomycosis seen  Lymphadenopathy:     Head:     Right side of head: No submental or submandibular adenopathy.     Left side of head: No submental or submandibular adenopathy.     Cervical: No cervical adenopathy.  Skin:    General: Skin is warm and dry.     Coloration: Skin is not pale.     Findings: No rash.     Nails: There is no clubbing.  Neurological:     General: No focal deficit present.     Mental Status: GIRLIE AUEN is alert and oriented to person, place, and time.     Sensory: No sensory deficit.  Psychiatric:        Mood and Affect: Mood normal.        Speech: Speech normal.        Behavior: Behavior normal.        Thought Content: Thought content normal.        Judgment: Judgment normal.         Assessment & Plan:   Problem List Items Addressed This Visit   None 48 minutes spent reviewing old records performing history and physical educating patient on need of pneumococcal vaccine refilling medications pertinent to noncardiac medical conditions Patient was recommended to receive the by valent Pfizer COVID-vaccine booster  No follow-ups on file.   Asencion Noble, MD

## 2021-09-16 ENCOUNTER — Encounter: Payer: Self-pay | Admitting: Critical Care Medicine

## 2021-09-16 ENCOUNTER — Ambulatory Visit: Payer: Medicare Other | Attending: Critical Care Medicine | Admitting: Critical Care Medicine

## 2021-09-16 VITALS — BP 133/85 | HR 65 | Temp 98.4°F | Resp 12 | Ht 63.5 in | Wt 180.0 lb

## 2021-09-16 DIAGNOSIS — R7303 Prediabetes: Secondary | ICD-10-CM

## 2021-09-16 DIAGNOSIS — N1831 Chronic kidney disease, stage 3a: Secondary | ICD-10-CM

## 2021-09-16 DIAGNOSIS — K219 Gastro-esophageal reflux disease without esophagitis: Secondary | ICD-10-CM | POA: Diagnosis not present

## 2021-09-16 DIAGNOSIS — I10 Essential (primary) hypertension: Secondary | ICD-10-CM

## 2021-09-16 DIAGNOSIS — D8685 Sarcoid myocarditis: Secondary | ICD-10-CM

## 2021-09-16 DIAGNOSIS — H269 Unspecified cataract: Secondary | ICD-10-CM

## 2021-09-16 DIAGNOSIS — E039 Hypothyroidism, unspecified: Secondary | ICD-10-CM

## 2021-09-16 DIAGNOSIS — I4811 Longstanding persistent atrial fibrillation: Secondary | ICD-10-CM

## 2021-09-16 DIAGNOSIS — I5042 Chronic combined systolic (congestive) and diastolic (congestive) heart failure: Secondary | ICD-10-CM

## 2021-09-16 DIAGNOSIS — M8580 Other specified disorders of bone density and structure, unspecified site: Secondary | ICD-10-CM | POA: Diagnosis not present

## 2021-09-16 DIAGNOSIS — E785 Hyperlipidemia, unspecified: Secondary | ICD-10-CM

## 2021-09-16 MED ORDER — CICLOPIROX 8 % EX SOLN: CUTANEOUS | 0 refills | Status: DC

## 2021-09-16 NOTE — Assessment & Plan Note (Signed)
Continue synthroid. Will recheck TSH at follow up.

## 2021-09-16 NOTE — Assessment & Plan Note (Signed)
Continue cholesterol therapy per cardiology.

## 2021-09-16 NOTE — Assessment & Plan Note (Signed)
Continue Protonix daily for symptoms.

## 2021-09-16 NOTE — Assessment & Plan Note (Signed)
Blood pressure 133/85 today. Well controlled. Continue current medications.

## 2021-09-16 NOTE — Assessment & Plan Note (Signed)
Continue prednisone and methotrexate taper. Followed by Cardiology.

## 2021-09-16 NOTE — Progress Notes (Signed)
Established Patient Office Visit  Subjective   Patient ID: Sheri Morgan, adult    DOB: 20-Oct-1945  Age: 76 y.o. MRN: [id number]  Chief Complaint  Patient presents with   Medication Management   Nail Problem    Sheri Morgan is a 76 year old female presents for follow up and medication refills. History of hypertension, myocardial infarction (2023), heart failure, cardiac sarcoidosis, hypothyroidism, prediabetes, and gastroesophageal reflux disease.  Blood pressure today 133/85. She complains of intermittent heart burn,constipation, and blurred vision in right eye. She is taking Prilosec and uses a stool softener as needed for relief. Ophthalmology is following her cataracts and is awaiting a second procedure. She would like for [place] to look at her toe nail today. No further acute concerns.  Patient was hospitalized in February for heart failure and discovered to have sarcoidosis. She is recovering well and states "she feels like nothing even happened". Her condition is being followed by cardiology. She is on a prednisone and methotrexate taper. Adherent to her medications. Denies alcohol, tobacco, illicit drug use.  Patient due for DEXA scan  Note last Cardiology HF clinic visit 08/03/2021 with Ninfa Meeker NP: CHF clinic 5/22 per [place] NP ASSESSMENT & PLAN: 1.  Chronic systolic HF due to NICM  - Echo (5/22): EF 30-35% - Cath (2/23): minimal CAD. Hs-troponin 204 -> 379 - Echo (2/23): EF 20-25% - cMRI (2/23): EF 11% RVEF 15% LGE pattern concerning for sarcoid. (D/w Dr. Gardiner Rhyme)  - NYHA II. Volume status stable. Continue lasix as needed.  - Continue losartan  - Continue spiro  -Continue farxiga - Holding b-blocker with recent bradycardia/low output for now.  - Check renal function.    2. Cardiac sarcoidosis - based on cMRI and clinical course - ACE level normal (52 U/L). CT chest. No extra-cardiac sarcoid. - Set up PET Scan at [facility] once prednisone is weaned down <10 mg.  - She is  taking 25 mg prednisone instead  of 15 mg daily. For this reason we will reset the prednisone taper . Today she will cut back prednisone to 20 mg daily.  Immunosuppressive Therapy for Cardiac Sarcoid  Prednisone   Weeks 1-4  04/29/21 through 05/26/21  30 mg (three 10 mg tablets) daily  Weeks 5-8  05/27/21 through 06/23/21  25 mg (two and a half 10 mg tablets) daily  Weeks 9-12  08/04/2021 - 08/31/2021  20 mg (two 10 mg tablets) daily  Weeks 13-16    15 mg (one and a half 10 mg tablets) daily  Weeks 17-18    10 mg (two 5 mg tablets) daily  Weeks 19-20    7.5 mg (one and a half 5 mg tablets) daily  Weeks 21-21    5 mg (one 5 mg tablet) daily  Weeks 23-24    2.5 mg (half of a 5 mg tablet) daily  Week 25    Stop prednisone     Methotrexate   Weeks 1-2  04/30/21 & 05/07/21  10 mg (four 2.5 mg tablets) once weekly  *Patient missed originally scheduled increase on 05/14/21. Restarted titration as below Weeks 3-4  05/20/21 & 05/27/21  12.5 mg (five 2.5 mg tablets) once weekly  Weeks 5-6  06/03/21 & 06/10/21  15 mg (six 2.5 mg tablets) once weekly  Weeks 7-8  06/17/21 & 06/24/21  17.5 mg (seven 2.5 mg tablets) once weekly  Weeks 9+  07/01/21 & thereafter 20 mg (eight 2.5 mg tablets) once weekly  Additional Medications  Bactrim DS : 1 tablet Mon-Wed- Fri while taking >15 mg of prednisone daily Folic Acid : 1 mg daily  Calcium Citrate -- switch to twice a day   Vitamin D: 1000 IU total daily  On protonix 40 mg daily while taking any dose of prednisone.      -Check CMET and CBC with diff at every visit.    3. PVCs/NSVT/PMVT - EP following.  - did not tolerate amiodarone previously due to worsening bradycardia - S/p ICD 02/23. - Continue amio 200 mg daily + mexiletine 200 bid for suppression.    4. Paroxysmal Atrial Flutter - S/p DCCV 2/16 -> back in [facility] on 05/02/21.  - s/p TEE/DCCV 05/08/21-->NSR - Continue Eliquis 5 mg bid. No bleeding issues. - Continue amio 200 mg daily.   5. H/o AKI - GDMT as  above. - Labs today.   6. Thrombocytopenia - Platelets 142>133>92 - Check CBC        Patient Active Problem List   Diagnosis Date Noted   Cataract 09/16/2021   Ingrown nail of great toe of right foot 05/27/2021   Chronic combined systolic and diastolic heart failure ([place]) 05/09/2021   Non-Obstructive CAD 05/09/2021   Paroxysmal atrial fibrillation 05/09/2021   New onset atrial flutter ([place]) 05/09/2021   NSVT (nonsustained ventricular tachycardia) ([place]) 05/09/2021   PMVT 05/09/2021   Cardiac sarcoidosis 05/09/2021   S/P ICD (internal cardiac defibrillator) procedure 05/09/2021   NICM (nonischemic cardiomyopathy) ([place]) 04/28/2021   Pulmonary nodule 04/23/2021   NSTEMI (non-ST elevated myocardial infarction) ([place]) 04/23/2021   Severe obesity (BMI 35.0-39.9) with comorbidity ([place]) 12/26/2020   Neuroforaminal stenosis of cervical spine 04/04/2020   Stenosis of cervical spine with myelopathy ([place]) 04/04/2020   Stage 3a chronic kidney disease ([place]) 02/06/2020   Numbness in both hands 02/05/2020   Longstanding persistent atrial fibrillation ([place]) 02/05/2020   Acquired hypothyroidism 02/05/2020   Prediabetes 02/05/2020   Primary hypertension 02/05/2020   Hyperlipidemia LDL goal <130 02/05/2020   PVC (premature ventricular contraction) 08/04/2016   GAD (generalized anxiety disorder) 08/10/2013   Osteopenia 10/29/2010   Myalgia and myositis, unspecified 04/21/2010   Esophageal reflux 11/01/2008   Allergic rhinitis 12/23/2006   Past Medical History:  Diagnosis Date   Anxiety    Atypical atrial flutter [facility])    Cardiac sarcoidosis    Chronic combined systolic and diastolic CHF (congestive heart failure) ([place])    Fibromyalgia    Hypertension    Non-ischemic cardiomyopathy ([place])    LHC 04/23/2021: 30% stenosis of proximal LAD   Paroxysmal atrial fibrillation ([facility])    Premature atrial contractions    Premature ventricular contraction    S/P ICD (internal cardiac defibrillator)  procedure    Tachy-brady syndrome ([place])    VT (ventricular tachycardia) ([place])    Past Surgical History:  Procedure Laterality Date   ABDOMINAL HYSTERECTOMY     CARDIOVERSION N/A 04/30/2021   Procedure: CARDIOVERSION;  Surgeon: Jolaine Artist, MD;  Location: [place];  Service: Cardiovascular;  Laterality: N/A;   CARDIOVERSION N/A 05/08/2021   Procedure: CARDIOVERSION;  Surgeon: Jolaine Artist, MD;  Location: [place];  Service: Cardiovascular;  Laterality: N/A;   ICD IMPLANT N/A 05/04/2021   Procedure: ICD IMPLANT;  Surgeon: Evans Lance, MD;  Location: [place] CV LAB;  Service: Cardiovascular;  Laterality: N/A;   LEFT HEART CATH AND CORONARY ANGIOGRAPHY N/A 04/23/2021   Procedure: LEFT HEART CATH AND CORONARY ANGIOGRAPHY;  Surgeon: Burnell Blanks, MD;  Location:  [facility];  Service: Cardiovascular;  Laterality: N/A;   LUMBAR LAMINECTOMY     TEE WITHOUT CARDIOVERSION N/A 05/08/2021   Procedure: TRANSESOPHAGEAL ECHOCARDIOGRAM (TEE);  Surgeon: Dolores Patty, MD;  Location: [facility] ENDOSCOPY;  Service: Cardiovascular;  Laterality: N/A;   THYROIDECTOMY     Social History   Tobacco Use   Smoking status: Never   Smokeless tobacco: Never  Vaping Use   Vaping Use: Never used  Substance Use Topics   Alcohol use: No   Drug use: No   Social History   Socioeconomic History   Marital status: Divorced    Spouse name: Not on file   Number of children: Not on file   Years of education: Not on file   Highest education level: Not on file  Occupational History   Not on file  Tobacco Use   Smoking status: Never   Smokeless tobacco: Never  Vaping Use   Vaping Use: Never used  Substance and Sexual Activity   Alcohol use: No   Drug use: No   Sexual activity: Never  Other Topics Concern   Not on file  Social History Narrative   Retired from [employer]   Social Determinants of Health   Financial Resource Strain: Low Risk  (11/13/2020)   Overall Financial  Resource Strain (CARDIA)    Difficulty of Paying Living Expenses: Not hard at all  Food Insecurity: No Food Insecurity (11/13/2020)   Hunger Vital Sign    Worried About Running Out of Food in the Last Year: Never true    Ran Out of Food in the Last Year: Never true  Transportation Needs: No Transportation Needs (11/13/2020)   PRAPARE - Administrator, Civil Service (Medical): No    Lack of Transportation (Non-Medical): No  Physical Activity: Insufficiently Active (11/13/2020)   Exercise Vital Sign    Days of Exercise per Week: 1 day    Minutes of Exercise per Session: 30 min  Stress: No Stress Concern Present (11/13/2020)   [employer] of Occupational Health - Occupational Stress Questionnaire    Feeling of Stress : Not at all  Social Connections: Moderately Integrated (11/13/2020)   Social Connection and Isolation Panel [NHANES]    Frequency of Communication with Friends and Family: More than three times a week    Frequency of Social Gatherings with Friends and Family: Not on file    Attends Religious Services: More than 4 times per year    Active Member of [employer] or Organizations: Yes    Attends Banker Meetings: Never    Marital Status: Divorced  Catering manager Violence: Not At Risk (11/13/2020)   Humiliation, Afraid, Rape, and Kick questionnaire    Fear of Current or Ex-Partner: No    Emotionally Abused: No    Physically Abused: No    Sexually Abused: No   Family Status  Relation Name Status   Mother  Deceased   Father  Deceased   Family History  Problem Relation Age of Onset   Diabetes Mother    Alcoholism Father    Allergies  Allergen Reactions   Codeine Itching    Review of Systems  Constitutional: Negative.   HENT: Negative.    Eyes:  Positive for blurred vision.  Cardiovascular: Negative.   Gastrointestinal:  Positive for constipation and heartburn.  Genitourinary: Negative.   Musculoskeletal: Negative.   Skin: Negative.    Neurological: Negative.   Endo/Heme/Allergies: Negative.   Psychiatric/Behavioral: Negative.  Objective:     BP 133/85 (BP Location: Left Arm, Patient Position: Sitting, Cuff Size: Normal)   Pulse 65   Temp 98.4 F (36.9 C) (Oral)   Resp 12   Ht 5' 3.5" (1.613 m)   Wt 180 lb (81.6 kg)   SpO2 96%   BMI 31.39 kg/m  BP Readings from Last 3 Encounters:  09/16/21 133/85  08/25/21 (!) 154/82  08/03/21 136/74   Wt Readings from Last 3 Encounters:  09/16/21 180 lb (81.6 kg)  08/25/21 181 lb 6.4 oz (82.3 kg)  08/03/21 183 lb 3.2 oz (83.1 kg)      Physical Exam Constitutional:      Appearance: Normal appearance. Sheri Morgan is obese.  HENT:     Right Ear: Tympanic membrane, ear canal and external ear normal.     Left Ear: Tympanic membrane, ear canal and external ear normal.     Nose: Nose normal.     Mouth/Throat:     Mouth: Mucous membranes are moist.     Dentition: Dental caries present.     Pharynx: Oropharynx is clear.  Eyes:     General: Lids are normal.     Comments: Right cataract  Cardiovascular:     Rate and Rhythm: Normal rate and regular rhythm.     Pulses: Normal pulses.     Heart sounds: Normal heart sounds.  Pulmonary:     Effort: Pulmonary effort is normal.     Breath sounds: Normal breath sounds.  Abdominal:     General: Abdomen is flat. Bowel sounds are normal.     Palpations: Abdomen is soft.     Tenderness: There is abdominal tenderness in the epigastric area.  Musculoskeletal:     Right lower leg: 1+ Pitting Edema present.     Left lower leg: 1+ Pitting Edema present.  Feet:     Right foot:     Toenail Condition: Fungal disease present.    Left foot:     Toenail Condition: Fungal disease present. Skin:    General: Skin is warm and dry.  Neurological:     Mental Status: Sheri Morgan is alert.  Psychiatric:        Mood and Affect: Mood normal.        Behavior: Behavior normal.      No results found for any visits on  09/16/21.  Last CBC Lab Results  Component Value Date   WBC 10.0 08/03/2021   HGB 14.1 08/03/2021   HCT 42.7 08/03/2021   MCV 96.4 08/03/2021   MCH 31.8 08/03/2021   RDW 21.2 (H) 08/03/2021   PLT PLATELET CLUMPS NOTED ON SMEAR, UNABLE TO ESTIMATE 0000000   Last metabolic panel Lab Results  Component Value Date   GLUCOSE 124 (H) 08/03/2021   NA 138 08/03/2021   K 4.1 08/03/2021   CL 106 08/03/2021   CO2 23 08/03/2021   BUN 26 (H) 08/03/2021   CREATININE 1.44 (H) 08/03/2021   GFRNONAA 37 (L) 08/03/2021   CALCIUM 9.5 08/03/2021   PROT 6.4 (L) 08/03/2021   ALBUMIN 3.7 08/03/2021   BILITOT 0.7 08/03/2021   ALKPHOS 63 08/03/2021   AST 23 08/03/2021   ALT 27 08/03/2021   ANIONGAP 9 08/03/2021   Last lipids Lab Results  Component Value Date   CHOL 253 (H) 04/23/2021   HDL 101 04/23/2021   LDLCALC 141 (H) 04/23/2021   TRIG 53 04/23/2021   CHOLHDL 2.5 04/23/2021   Last hemoglobin A1c Lab  Results  Component Value Date   HGBA1C 6.0 (H) 04/23/2021   Last thyroid functions Lab Results  Component Value Date   TSH 0.057 (L) 07/13/2021   T4TOTAL 9.2 07/13/2021   Last vitamin D No results found for: "25OHVITD2", "25OHVITD3", "VD25OH" Last vitamin B12 and Folate Lab Results  Component Value Date   VITAMINB12 314 02/05/2020   FOLATE 21.9 02/05/2020    The ASCVD Risk score (Arnett DK, et al., 2019) failed to calculate for the following reasons:   The patient has a prior MI or stroke diagnosis    Assessment & Plan:   Problem List Items Addressed This Visit       Cardiovascular and Mediastinum   Longstanding persistent atrial fibrillation [facility])    Patient not in Atrial Fibrillation today on physical exam. Continue Eliquis.      Primary hypertension    Blood pressure 133/85 today. Well controlled. Continue current medications.      Chronic combined systolic and diastolic heart failure [facility])    Patient today doing well. Physical exam revealed 1+ bilateral  lower extremity edema. Monitored by cardiology. Continue current medications. Will continue to monitor.      Cardiac sarcoidosis    Continue prednisone and methotrexate taper. Followed by Cardiology.        Digestive   Esophageal reflux    Continue Protonix daily for symptoms.        Endocrine   Acquired hypothyroidism    Continue synthroid. Will recheck TSH at follow up.        Musculoskeletal and Integument   Osteopenia - Primary    DEXA scan ordered today. Continue vitamin supplementation.      Relevant Orders   DG BONE DENSITY (DXA)     Genitourinary   Stage 3a chronic kidney disease (HCC)    Improvement seen in kidney function. Labs drawn by cardiology in May. Will continue to monitor.        Other   Prediabetes    Hemoglobin A1C 6.0 in February. Will re-check at follow up. Continue current medications.      Hyperlipidemia LDL goal <130    Continue cholesterol therapy per cardiology.      Cataract    Currently seeing opthalmology. Patient awaiting treatment.       Return in about 4 months (around 01/17/2022).    Shan Levans, MD

## 2021-09-16 NOTE — Patient Instructions (Signed)
Refills on all your medications [facility]  Bone DEXA scan will be obtained for bone density this will be scheduled through the breast center they will call you  Keep your follow-up appointments with cardiology upcoming this month  Return to Dr. Delford Field 4 months

## 2021-09-16 NOTE — Assessment & Plan Note (Signed)
Improvement seen in kidney function. Labs drawn by cardiology in May. Will continue to monitor.

## 2021-09-16 NOTE — Assessment & Plan Note (Signed)
Currently seeing opthalmology. Patient awaiting treatment.

## 2021-09-16 NOTE — Assessment & Plan Note (Signed)
Hemoglobin A1C 6.0 in February. Will re-check at follow up. Continue current medications.

## 2021-09-16 NOTE — Assessment & Plan Note (Signed)
Patient today doing well. Physical exam revealed 1+ bilateral lower extremity edema. Monitored by cardiology. Continue current medications. Will continue to monitor.

## 2021-09-16 NOTE — Assessment & Plan Note (Addendum)
DEXA scan ordered today. Continue vitamin supplementation.

## 2021-09-16 NOTE — Assessment & Plan Note (Signed)
Patient not in Atrial Fibrillation today on physical exam. Continue Eliquis.

## 2021-09-22 ENCOUNTER — Ambulatory Visit (INDEPENDENT_AMBULATORY_CARE_PROVIDER_SITE_OTHER): Payer: Medicare Other | Admitting: Internal Medicine

## 2021-09-22 ENCOUNTER — Encounter: Payer: Self-pay | Admitting: Internal Medicine

## 2021-09-22 VITALS — BP 112/70 | HR 67 | Ht 63.5 in | Wt 180.4 lb

## 2021-09-22 DIAGNOSIS — Z95810 Presence of automatic (implantable) cardiac defibrillator: Secondary | ICD-10-CM | POA: Diagnosis not present

## 2021-09-22 DIAGNOSIS — I480 Paroxysmal atrial fibrillation: Secondary | ICD-10-CM | POA: Diagnosis not present

## 2021-09-22 DIAGNOSIS — I5042 Chronic combined systolic (congestive) and diastolic (congestive) heart failure: Secondary | ICD-10-CM | POA: Diagnosis not present

## 2021-09-22 DIAGNOSIS — I428 Other cardiomyopathies: Secondary | ICD-10-CM

## 2021-09-22 DIAGNOSIS — I495 Sick sinus syndrome: Secondary | ICD-10-CM | POA: Diagnosis not present

## 2021-09-22 DIAGNOSIS — D8685 Sarcoid myocarditis: Secondary | ICD-10-CM

## 2021-09-22 NOTE — Patient Instructions (Signed)
Medication Instructions:  Your physician recommends that you continue on your current medications as directed. Please refer to the Current Medication list given to you today.  Labwork: None ordered.  Testing/Procedures: None ordered.  Follow-Up: Your physician wants you to follow-up in: one year with Lewayne Bunting, MD or one of the following Advanced Practice Providers on your designated Care Team:   Francis Dowse, [place] Casimiro Needle "Mardelle Matte" Lanna Poche, [place]  Remote monitoring is used to monitor your ICD from home. This monitoring reduces the number of office visits required to check your device to one time per year. It allows [place] to keep an eye on the functioning of your device to ensure it is working properly. You are scheduled for a device check from home on 11/02/2021. You may send your transmission at any time that day. If you have a wireless device, the transmission will be sent automatically. After your physician reviews your transmission, you will receive a postcard with your next transmission date.  Any Other Special Instructions Will Be Listed Below (If Applicable).  If you need a refill on your cardiac medications before your next appointment, please call your pharmacy.   Important Information About Sugar

## 2021-09-22 NOTE — Progress Notes (Signed)
HPI Sheri Morgan returns today for followup. She is a pleasant 76 yo woman with a non-ischemic CM, chronic systolic heart failure, EF 15%, PAF, and VT. She has done well on amiodarone and GDMT for CHF. She has class 2 symptoms. No problems with her ICD. She denies chest pain. No ICD therapies. She is thought to have possible cardiac sarcoid.  Allergies  Allergen Reactions   Codeine Itching     Current Outpatient Medications  Medication Sig Dispense Refill   amiodarone (PACERONE) 200 MG tablet Take 1 tablet (200 mg total) by mouth daily. 30 tablet 3   apixaban (ELIQUIS) 5 MG TABS tablet Take 1 tablet (5 mg total) by mouth 2 (two) times daily. 60 tablet 3   calcium citrate (CALCITRATE - DOSED IN MG ELEMENTAL CALCIUM) 950 (200 Ca) MG tablet Take 1 tablet (200 mg of elemental calcium total) by mouth 2 (two) times daily. 30 tablet 6   ciclopirox (PENLAC) 8 % solution PLEASE SEE ATTACHED FOR DETAILED DIRECTIONS 6.6 mL 0   D3-1000 25 MCG (1000 UT) capsule Take 1,000 Units by mouth daily.     dapagliflozin propanediol (FARXIGA) 10 MG TABS tablet Take 1 tablet (10 mg total) by mouth daily. 90 tablet 3   folic acid (FOLVITE) 1 MG tablet Take 1 tablet (1 mg total) by mouth daily. 30 tablet 3   furosemide (LASIX) 40 MG tablet Take 1 tablet as needed for weight gain > 3 lb in a day or 5 lb in a week, leg edema, shortness of breath 30 tablet 3   levothyroxine (SYNTHROID) 200 MCG tablet Take 1 tablet (200 mcg total) by mouth daily before breakfast. Take 1 tablet (200 mcg total) by mouth daily before breakfast. Further refills must be from PCP. 90 tablet 2   losartan (COZAAR) 25 MG tablet Take 1 tablet (25 mg total) by mouth daily. 90 tablet 3   methotrexate (RHEUMATREX) 2.5 MG tablet TAKE 8 TABLETS BY MOUTH EVERY THURSDAY (Patient taking differently: TAKE 8 TABLETS BY MOUTH EVERY Friday.) 96 tablet 3   mexiletine (MEXITIL) 200 MG capsule Take 1 capsule (200 mg total) by mouth 2 (two) times daily. 60  capsule 3   pantoprazole (PROTONIX) 40 MG tablet Take 1 tablet (40 mg total) by mouth daily. 30 tablet 3   potassium chloride SA (KLOR-CON M) 20 MEQ tablet Take 1 tablet (20 mEq total) by mouth when taking furosemide 30 tablet 3   prednisoLONE acetate (PRED FORTE) 1 % ophthalmic suspension Place 1 drop into the left eye every evening.     predniSONE (DELTASONE) 10 MG tablet Take 1.5 tablets (15 mg total) by mouth daily with breakfast. 45 tablet 1   rosuvastatin (CRESTOR) 40 MG tablet TAKE 1 TABLET BY MOUTH EVERY DAY 90 tablet 3   spironolactone (ALDACTONE) 25 MG tablet Take 0.5 tablets (12.5 mg total) by mouth daily. 45 tablet 3   sulfamethoxazole-trimethoprim (BACTRIM DS) 800-160 MG tablet Take 1 tablet by mouth every Monday, Wednesday, and Friday. 12 tablet 3   No current facility-administered medications for this visit.     Past Medical History:  Diagnosis Date   Anxiety    Atypical atrial flutter [facility])    Cardiac sarcoidosis    Chronic combined systolic and diastolic CHF (congestive heart failure) (HCC)    Fibromyalgia    Hypertension    Non-ischemic cardiomyopathy ([place])    LHC 04/23/2021: 30% stenosis of proximal LAD   Paroxysmal atrial fibrillation ([place])  Premature atrial contractions    Premature ventricular contraction    S/P ICD (internal cardiac defibrillator) procedure    Tachy-brady syndrome (HCC)    VT (ventricular tachycardia) (HCC)     ROS:   All systems reviewed and negative except as noted in the HPI.   Past Surgical History:  Procedure Laterality Date   ABDOMINAL HYSTERECTOMY     CARDIOVERSION N/A 04/30/2021   Procedure: CARDIOVERSION;  Surgeon: Dolores Patty, MD;  Location: [facility] ENDOSCOPY;  Service: Cardiovascular;  Laterality: N/A;   CARDIOVERSION N/A 05/08/2021   Procedure: CARDIOVERSION;  Surgeon: Dolores Patty, MD;  Location: [facility] ENDOSCOPY;  Service: Cardiovascular;  Laterality: N/A;   ICD IMPLANT N/A 05/04/2021   Procedure: ICD IMPLANT;   Surgeon: Marinus Maw, MD;  Location: [facility] INVASIVE CV LAB;  Service: Cardiovascular;  Laterality: N/A;   LEFT HEART CATH AND CORONARY ANGIOGRAPHY N/A 04/23/2021   Procedure: LEFT HEART CATH AND CORONARY ANGIOGRAPHY;  Surgeon: Kathleene Hazel, MD;  Location: [facility] INVASIVE CV LAB;  Service: Cardiovascular;  Laterality: N/A;   LUMBAR LAMINECTOMY     TEE WITHOUT CARDIOVERSION N/A 05/08/2021   Procedure: TRANSESOPHAGEAL ECHOCARDIOGRAM (TEE);  Surgeon: Dolores Patty, MD;  Location: [facility] ENDOSCOPY;  Service: Cardiovascular;  Laterality: N/A;   THYROIDECTOMY       Family History  Problem Relation Age of Onset   Diabetes Mother    Alcoholism Father      Social History   Socioeconomic History   Marital status: Divorced    Spouse name: Not on file   Number of children: Not on file   Years of education: Not on file   Highest education level: Not on file  Occupational History   Not on file  Tobacco Use   Smoking status: Never   Smokeless tobacco: Never  Vaping Use   Vaping Use: Never used  Substance and Sexual Activity   Alcohol use: No   Drug use: No   Sexual activity: Never  Other Topics Concern   Not on file  Social History Narrative   Retired from [employer]   Social Determinants of Health   Financial Resource Strain: Low Risk  (11/13/2020)   Overall Financial Resource Strain (CARDIA)    Difficulty of Paying Living Expenses: Not hard at all  Food Insecurity: No Food Insecurity (11/13/2020)   Hunger Vital Sign    Worried About Running Out of Food in the Last Year: Never true    Ran Out of Food in the Last Year: Never true  Transportation Needs: No Transportation Needs (11/13/2020)   PRAPARE - Administrator, Civil Service (Medical): No    Lack of Transportation (Non-Medical): No  Physical Activity: Insufficiently Active (11/13/2020)   Exercise Vital Sign    Days of Exercise per Week: 1 day    Minutes of Exercise per Session: 30 min  Stress: No Stress Concern  Present (11/13/2020)   [employer] of Occupational Health - Occupational Stress Questionnaire    Feeling of Stress : Not at all  Social Connections: Moderately Integrated (11/13/2020)   Social Connection and Isolation Panel [NHANES]    Frequency of Communication with Friends and Family: More than three times a week    Frequency of Social Gatherings with Friends and Family: Not on file    Attends Religious Services: More than 4 times per year    Active Member of [employer] or Organizations: Yes    Attends Banker Meetings: Never    Marital Status:  Divorced  Intimate Partner Violence: Not At Risk (11/13/2020)   Humiliation, Afraid, Rape, and Kick questionnaire    Fear of Current or Ex-Partner: No    Emotionally Abused: No    Physically Abused: No    Sexually Abused: No     BP 112/70   Pulse 67   Ht 5' 3.5" (1.613 m)   Wt 180 lb 6.4 oz (81.8 kg)   SpO2 97%   BMI 31.46 kg/m   Physical Exam:  Well appearing NAD HEENT: Unremarkable Neck:  No JVD, no thyromegally Lymphatics:  No adenopathy Back:  No CVA tenderness Lungs:  Clear with no with no wheezes HEART:  Regular rate rhythm, no murmurs, no rubs, no clicks Abd:  soft, positive bowel sounds, no organomegally, no rebound, no guarding Ext:  2 plus pulses, no edema, no cyanosis, no clubbing Skin:  No rashes no nodules Neuro:  CN II through XII intact, motor grossly intact  EKG - nsr with poor R wave progression.  DEVICE  Normal device function.  See PaceArt for details.   Assess/Plan:  Acute on chronic systolic heart failure - she appears to be euvolemic and her fluid index looks good. She will continue current GDMT. VT - she is quiet on amiodarone and mexitil. Continue.  PAF - she is maintaining NSR.  Coags - she has not had any bleeding on eliquis.  ICD - her St. Jude DDD ICD is working normally. We will follow.  Sharlot Gowda Janise Gora,MD

## 2021-09-23 ENCOUNTER — Encounter: Payer: Self-pay | Admitting: Internal Medicine

## 2021-09-23 ENCOUNTER — Ambulatory Visit (HOSPITAL_COMMUNITY)
Admission: RE | Admit: 2021-09-23 | Discharge: 2021-09-23 | Disposition: A | Discharge status: Home / Self Care | Payer: Medicare Other | Source: Ambulatory Visit | Attending: Internal Medicine | Admitting: Internal Medicine

## 2021-09-23 ENCOUNTER — Encounter (HOSPITAL_COMMUNITY): Payer: Self-pay | Admitting: Internal Medicine

## 2021-09-23 VITALS — BP 160/98 | HR 69 | Wt 179.8 lb

## 2021-09-23 DIAGNOSIS — I5022 Chronic systolic (congestive) heart failure: Secondary | ICD-10-CM

## 2021-09-23 DIAGNOSIS — I5042 Chronic combined systolic (congestive) and diastolic (congestive) heart failure: Secondary | ICD-10-CM

## 2021-09-23 DIAGNOSIS — D8685 Sarcoid myocarditis: Secondary | ICD-10-CM

## 2021-09-23 DIAGNOSIS — I480 Paroxysmal atrial fibrillation: Secondary | ICD-10-CM | POA: Diagnosis not present

## 2021-09-23 DIAGNOSIS — I493 Ventricular premature depolarization: Secondary | ICD-10-CM | POA: Diagnosis not present

## 2021-09-23 DIAGNOSIS — Z7952 Long term (current) use of systemic steroids: Secondary | ICD-10-CM | POA: Diagnosis not present

## 2021-09-23 DIAGNOSIS — D696 Thrombocytopenia, unspecified: Secondary | ICD-10-CM | POA: Insufficient documentation

## 2021-09-23 DIAGNOSIS — Z79631 Long term (current) use of antimetabolite agent: Secondary | ICD-10-CM | POA: Diagnosis not present

## 2021-09-23 DIAGNOSIS — Z79899 Other long term (current) drug therapy: Secondary | ICD-10-CM | POA: Insufficient documentation

## 2021-09-23 DIAGNOSIS — E669 Obesity, unspecified: Secondary | ICD-10-CM | POA: Diagnosis not present

## 2021-09-23 DIAGNOSIS — I428 Other cardiomyopathies: Secondary | ICD-10-CM | POA: Insufficient documentation

## 2021-09-23 DIAGNOSIS — I110 Hypertensive heart disease with heart failure: Secondary | ICD-10-CM | POA: Diagnosis not present

## 2021-09-23 DIAGNOSIS — M797 Fibromyalgia: Secondary | ICD-10-CM | POA: Diagnosis not present

## 2021-09-23 DIAGNOSIS — Z7901 Long term (current) use of anticoagulants: Secondary | ICD-10-CM | POA: Diagnosis not present

## 2021-09-23 DIAGNOSIS — I2510 Atherosclerotic heart disease of native coronary artery without angina pectoris: Secondary | ICD-10-CM | POA: Diagnosis not present

## 2021-09-23 DIAGNOSIS — I472 Ventricular tachycardia, unspecified: Secondary | ICD-10-CM | POA: Diagnosis not present

## 2021-09-23 DIAGNOSIS — I4892 Unspecified atrial flutter: Secondary | ICD-10-CM | POA: Diagnosis not present

## 2021-09-23 DIAGNOSIS — Z95810 Presence of automatic (implantable) cardiac defibrillator: Secondary | ICD-10-CM | POA: Insufficient documentation

## 2021-09-23 LAB — COMPREHENSIVE METABOLIC PANEL
ALT: 30 U/L (ref 0–44)
AST: 26 U/L (ref 15–41)
Albumin: 3.6 g/dL (ref 3.5–5.0)
Alkaline Phosphatase: 66 U/L (ref 38–126)
Anion gap: 9 (ref 5–15)
BUN: 23 mg/dL (ref 8–23)
CO2: 25 mmol/L (ref 22–32)
Calcium: 9.5 mg/dL (ref 8.9–10.3)
Chloride: 107 mmol/L (ref 98–111)
Creatinine, Ser: 1.6 mg/dL — ABNORMAL HIGH (ref 0.44–1.00)
GFR, Estimated: 33 mL/min — ABNORMAL LOW (ref >60)
Glucose, Bld: 82 mg/dL (ref 70–99)
Potassium: 3.8 mmol/L (ref 3.5–5.1)
Sodium: 141 mmol/L (ref 135–145)
Total Bilirubin: 0.6 mg/dL (ref 0.3–1.2)
Total Protein: 6.5 g/dL (ref 6.5–8.1)

## 2021-09-23 LAB — CBC WITH DIFFERENTIAL/PLATELET
Abs Immature Granulocytes: 0.3 10*3/uL — ABNORMAL HIGH (ref 0.00–0.07)
Basophils Absolute: 0 10*3/uL (ref 0.0–0.1)
Basophils Relative: 1 %
Eosinophils Absolute: 0 10*3/uL (ref 0.0–0.5)
Eosinophils Relative: 0 %
HCT: 40.9 % (ref 36.0–46.0)
Hemoglobin: 13.4 g/dL (ref 12.0–15.0)
Immature Granulocytes: 3 %
Lymphocytes Relative: 28 %
Lymphs Abs: 2.4 10*3/uL (ref 0.7–4.0)
MCH: 32.8 pg (ref 26.0–34.0)
MCHC: 32.8 g/dL (ref 30.0–36.0)
MCV: 100 fL (ref 80.0–100.0)
Monocytes Absolute: 0.7 10*3/uL (ref 0.1–1.0)
Monocytes Relative: 8 %
Neutro Abs: 5.3 10*3/uL (ref 1.7–7.7)
Neutrophils Relative %: 60 %
Platelets: 135 10*3/uL — ABNORMAL LOW (ref 150–400)
RBC: 4.09 MIL/uL (ref 3.87–5.11)
RDW: 18.9 % — ABNORMAL HIGH (ref 11.5–15.5)
WBC: 8.8 10*3/uL (ref 4.0–10.5)
nRBC: 0.2 % (ref 0.0–0.2)

## 2021-09-23 MED ORDER — PREDNISONE 5 MG PO TABS: ORAL_TABLET | ORAL | 0 refills | Status: AC

## 2021-09-23 MED ORDER — LOSARTAN POTASSIUM 50 MG PO TABS: 50.0000 mg | ORAL_TABLET | Freq: Every day | ORAL | 3 refills | Status: DC

## 2021-09-23 NOTE — Progress Notes (Addendum)
ADVANCED HF CLINIC CONSULT NOTE   Primary Care: Storm Frisk, MD HF Cardiologist: Dr. Gala Romney  HPI: Sheri Morgan is a 76 y.o. obese woman with fibromyalgia, PAF, PVCs, HTN, sarcoid,  and systolic HF   Previously followed by Dr. Alonza Bogus in [place] Saw Dr. Ashby Dawes in 2/22 with recurrent AF. Echo 5/22 with newly discovered LV dysfunction EF 30-35%.   Admitted 2/23 with CP and worsening SOB. CT negative for dissection. Underwent cath showing only 30% stenosis of proximal LAD and elevated LVEDP EF 20-25%. ? Possible takotsubo.  Echo 20-25% moderate MR. On way to cath developed VT. Started on amiodarone developed more bradycardia -> PMVT. She had subsequent hypotension and cardiogenic shock requiring DBA gtt. EP consulted and lidocaine gtt started with reduced PVC burden. Lido gtt weaned off and mexiletine started but was stopped. She developed AFL with RVR and underwent DCCV. Initially successful but then had recurrent AFL. Due to bradycardia and need for amiodarone, underwent placement of dual chamber ICD. Then loaded with IV amio and underwent successful TEE/DCCV. She remained on amio for PVCs and mexiletine restarted when PVCs not fully suppressed. cMRI suspicious for cardiac sarcoid and started on treatment protocol. GDMT limited by elevated SCr, discharged home, weight 191 lbs.  She is here today for HF f/u. Has been taking 15 mg prednisone daily and 20 mg methotrexate once weekly. She has been feeling well. Denies any dyspnea, orthopnea or PND. Weight trending down, 179 lb today >> 183 lb last f/u in May. She reports mild LE edema, uses lasix PRN. Has only needed 4 doses since her hospitalization. She lives alone and maintains her home independently. Her blood pressure tends to average 120/70 at home.   Device interrogation: Thoracic impedance trending above threshold, no VT or VF  Cardiac Studies: - Echo (2/23): LVEF of 20-25% with global hypokinesis, mildly enlarged RV with moderately  reduced systolic function, mild to moderate MR, and moderately elevated RVSP of 54.0 mmHg.   - LHC (2/23): 30% stenosis of pLAD, elevated LVEDP  - cMRI (2/23): LVEF 11%, RVEF 55%, apical LGE, no LV thrombus, pattern concerning for cardiac sarcoid.  Past Medical History:  Diagnosis Date   Anxiety    Atypical atrial flutter (HCC)    Cardiac sarcoidosis    Chronic combined systolic and diastolic CHF (congestive heart failure) (HCC)    Fibromyalgia    Hypertension    Non-ischemic cardiomyopathy (HCC)    LHC 04/23/2021: 30% stenosis of proximal LAD   Paroxysmal atrial fibrillation (HCC)    Premature atrial contractions    Premature ventricular contraction    S/P ICD (internal cardiac defibrillator) procedure    Tachy-brady syndrome (HCC)    VT (ventricular tachycardia) (HCC)    Current Outpatient Medications  Medication Sig Dispense Refill   amiodarone (PACERONE) 200 MG tablet Take 1 tablet (200 mg total) by mouth daily. 30 tablet 3   apixaban (ELIQUIS) 5 MG TABS tablet Take 1 tablet (5 mg total) by mouth 2 (two) times daily. 60 tablet 3   calcium citrate (CALCITRATE - DOSED IN MG ELEMENTAL CALCIUM) 950 (200 Ca) MG tablet Take 1 tablet (200 mg of elemental calcium total) by mouth 2 (two) times daily. 30 tablet 6   ciclopirox (PENLAC) 8 % solution PLEASE SEE ATTACHED FOR DETAILED DIRECTIONS 6.6 mL 0   D3-1000 25 MCG (1000 UT) capsule Take 1,000 Units by mouth daily.     dapagliflozin propanediol (FARXIGA) 10 MG TABS tablet Take 1 tablet (10 mg total) by  mouth daily. 90 tablet 3   folic acid (FOLVITE) 1 MG tablet Take 1 tablet (1 mg total) by mouth daily. 30 tablet 3   furosemide (LASIX) 40 MG tablet Take 1 tablet as needed for weight gain > 3 lb in a day or 5 lb in a week, leg edema, shortness of breath 30 tablet 3   levothyroxine (SYNTHROID) 200 MCG tablet Take 1 tablet (200 mcg total) by mouth daily before breakfast. Take 1 tablet (200 mcg total) by mouth daily before breakfast. Further  refills must be from PCP. 90 tablet 2   losartan (COZAAR) 25 MG tablet Take 1 tablet (25 mg total) by mouth daily. 90 tablet 3   methotrexate (RHEUMATREX) 2.5 MG tablet TAKE 8 TABLETS BY MOUTH EVERY THURSDAY (Patient taking differently: TAKE 8 TABLETS BY MOUTH EVERY Friday.) 96 tablet 3   mexiletine (MEXITIL) 200 MG capsule Take 1 capsule (200 mg total) by mouth 2 (two) times daily. 60 capsule 3   pantoprazole (PROTONIX) 40 MG tablet Take 1 tablet (40 mg total) by mouth daily. 30 tablet 3   potassium chloride SA (KLOR-CON M) 20 MEQ tablet Take 1 tablet (20 mEq total) by mouth when taking furosemide 30 tablet 3   prednisoLONE acetate (PRED FORTE) 1 % ophthalmic suspension Place 1 drop into the left eye every evening.     predniSONE (DELTASONE) 10 MG tablet Take 1.5 tablets (15 mg total) by mouth daily with breakfast. 45 tablet 1   rosuvastatin (CRESTOR) 40 MG tablet TAKE 1 TABLET BY MOUTH EVERY DAY 90 tablet 3   spironolactone (ALDACTONE) 25 MG tablet Take 0.5 tablets (12.5 mg total) by mouth daily. 45 tablet 3   sulfamethoxazole-trimethoprim (BACTRIM DS) 800-160 MG tablet Take 1 tablet by mouth every Monday, Wednesday, and Friday. 12 tablet 3   No current facility-administered medications for this encounter.   Allergies  Allergen Reactions   Codeine Itching   Social History   Socioeconomic History   Marital status: Divorced    Spouse name: Not on file   Number of children: Not on file   Years of education: Not on file   Highest education level: Not on file  Occupational History   Not on file  Tobacco Use   Smoking status: Never   Smokeless tobacco: Never  Vaping Use   Vaping Use: Never used  Substance and Sexual Activity   Alcohol use: No   Drug use: No   Sexual activity: Never  Other Topics Concern   Not on file  Social History Narrative   Retired from [employer]   Social Determinants of Health   Financial Resource Strain: Low Risk  (11/13/2020)   Overall Financial Resource  Strain (CARDIA)    Difficulty of Paying Living Expenses: Not hard at all  Food Insecurity: No Food Insecurity (11/13/2020)   Hunger Vital Sign    Worried About Running Out of Food in the Last Year: Never true    Ran Out of Food in the Last Year: Never true  Transportation Needs: No Transportation Needs (11/13/2020)   PRAPARE - Administrator, Civil Service (Medical): No    Lack of Transportation (Non-Medical): No  Physical Activity: Insufficiently Active (11/13/2020)   Exercise Vital Sign    Days of Exercise per Week: 1 day    Minutes of Exercise per Session: 30 min  Stress: No Stress Concern Present (11/13/2020)   [employer] of Occupational Health - Occupational Stress Questionnaire    Feeling of Stress :  Not at all  Social Connections: Moderately Integrated (11/13/2020)   Social Connection and Isolation Panel [NHANES]    Frequency of Communication with Friends and Family: More than three times a week    Frequency of Social Gatherings with Friends and Family: Not on file    Attends Religious Services: More than 4 times per year    Active Member of [employer] or Organizations: Yes    Attends Banker Meetings: Never    Marital Status: Divorced  Catering manager Violence: Not At Risk (11/13/2020)   Humiliation, Afraid, Rape, and Kick questionnaire    Fear of Current or Ex-Partner: No    Emotionally Abused: No    Physically Abused: No    Sexually Abused: No   Family History  Problem Relation Age of Onset   Diabetes Mother    Alcoholism Father    BP (!) 160/98   Pulse 69   Wt 81.6 kg (179 lb 12.8 oz)   SpO2 99%   BMI 31.35 kg/m   Wt Readings from Last 3 Encounters:  09/23/21 81.6 kg (179 lb 12.8 oz)  09/22/21 81.8 kg (180 lb 6.4 oz)  09/16/21 81.6 kg (180 lb)   PHYSICAL EXAM: General:  Well appearing. Ambulated into clinic. HEENT: normal Neck: supple. no JVD. Carotids 2+ bilat; no bruits.  Cor: PMI nondisplaced. Regular rate & rhythm. No rubs, gallops or  murmurs. Lungs: clear Abdomen: soft, nontender, nondistended.  Extremities: no cyanosis, clubbing, rash, edema Neuro: alert & orientedx3, cranial nerves grossly intact. moves all 4 extremities w/o difficulty. Affect pleasant    ASSESSMENT & PLAN: 1.  Chronic systolic HF due to NICM  - Echo (5/22): EF 30-35% - Cath (2/23): minimal CAD. Hs-troponin 204 -> 379 - Echo (2/23): EF 20-25% - cMRI (2/23): EF 11% RVEF 15% LGE pattern concerning for sarcoid. (D/w Dr. Bjorn Pippin)  - NYHA II. Volume status stable. Continue lasix as needed. Thoracic impedance trending above threshold on device check. - Increase Losartan to 50 mg daily. Can consider Entresto next. - Continue spiro 12.5 mg daily - Continue farxiga - Holding b-blocker with hx recent bradycardia/low output - CMET today, BMET in 2 weeks   2. Cardiac sarcoidosis - based on cMRI and clinical course - ACE level normal (52 U/L). CT chest. No extra-cardiac sarcoid. - Decrease prednisone to 10 mg daily. See taper schedule below.  - Continue 20 mg Methotrexate once weekly.  Immunosuppressive Therapy for Cardiac Sarcoid   Weeks 17-18 09/24/21 through 10/07/21 10 mg (two 5 mg tablets) daily Stop taking Bactrim on 09/24/21 (start of week 17)  Weeks 19-20 10/08/21 through 10/21/21 7.5 mg (one and a half 5 mg tablets) daily  Weeks 21-22 10/22/21 through 11/04/21 5 mg (one 5 mg tablet) daily  Weeks 23-24 11/05/21 through 11/18/21 2.5 mg (half of a 5 mg tablet) daily  Week 25 11/19/21 Stop prednisone      Additional Medications  Bactrim DS: Can stop Folic Acid: 1 mg daily  Calcium Citrate: twice a day   Vitamin D: 1000 IU total daily  On protonix 40 mg daily while taking any dose of prednisone.     -CMET and CBC with diff today -Set up PET scan at [facility] in next few weeks, will need another PET in 3-4 months  3. PVCs/NSVT/PMVT - EP following.  - did not tolerate amiodarone previously due to worsening bradycardia - S/p ICD 02/23. No VT on device  check today - Continue amio 200 mg daily + mexiletine 200  bid for suppression.    4. Paroxysmal Atrial Flutter - S/p DCCV 2/16 -> back in AFL on 05/02/21.  - s/p TEE/DCCV 05/08/21-->NSR - Continue Eliquis 5 mg bid. No bleeding issues. - Continue amio 200 mg daily.   5. H/o AKI - GDMT as above. - Labs today.   6. Thrombocytopenia - Platelets 142>133>92>113>125 - Check CBC   Follow-up: 4 weeks with PharmD to assist with Prednisone taper (ensure patient taking correct doses), 2-3 months with Dr. Gala Romney  [facility] PA-C 10:19 AM  Patient seen and examined with the above-signed Advanced Practice Provider and/or Housestaff. I personally reviewed laboratory data, imaging studies and relevant notes. I independently examined the patient and formulated the important aspects of the plan. I have edited the note to reflect any of my changes or salient points. I have personally discussed the plan with the patient and/or family.  Overall doing very well. NYHA I. No ICD shocks. Tapering prednisone. On MTX 20 qweek. No edema, orthopnea or PND.   General:  Well appearing. No resp difficulty HEENT: normal Neck: supple. no JVD. Carotids 2+ bilat; no bruits. No lymphadenopathy or thryomegaly appreciated. Cor: PMI nondisplaced. Regular rate & rhythm. No rubs, gallops or murmurs. Lungs: clear Abdomen: soft, nontender, nondistended. No hepatosplenomegaly. No bruits or masses. Good bowel sounds. Extremities: no cyanosis, clubbing, rash, edema Neuro: alert & orientedx3, cranial nerves grossly intact. moves all 4 extremities w/o difficulty. Affect pleasant  Doing well. Continue to wean prednisone. Continue MTX. Will schedule for PET at [facility]. Increase losartan.   Labs today. ICD interrogated in person today. No VT or AF.   Arvilla Meres, MD  12:42 PM

## 2021-09-23 NOTE — Patient Instructions (Signed)
Medication Changes:  Increase Losartan to 50 mg Daily  STOP Bactrim  Decrease Prednisone to 10 mg Daily, we have sent in 5 mg tablets for you, PLEASE TAKE 2 TABLETS DAILY until your next appointment on 7/26 at 1 pm  Lab Work:  Labs done today, your results will be available in MyChart, we will contact you for abnormal readings.  Testing/Procedures:  Your provider has recommended you have a Cardiac PET Scan at [facility]. We will get this approved with your insurance company and get it scheduled for you. We will call you with the date and time and instructions. [facility] will call you to review this information the day before the test.  Referrals:  None  Special Instructions // Education:  Do the following things EVERYDAY: Weigh yourself in the morning before breakfast. Write it down and keep it in a log. Take your medicines as prescribed Eat low salt foods--Limit salt (sodium) to 2000 mg per day.  Stay as active as you can everyday Limit all fluids for the day to less than 2 liters  Follow-Up in: 2 weeks with our pharmacy clinic and in 2 months with Dr Gala Romney  At the [facility], you and your health needs are our priority. We have a designated team specialized in the treatment of Heart Failure. This Care Team includes your primary Heart Failure Specialized Cardiologist (physician), Advanced Practice Providers (APPs- Physician Assistants and Nurse Practitioners), and Pharmacist who all work together to provide you with the care you need, when you need it.   You may see any of the following providers on your designated Care Team at your next follow up:  Dr Arvilla Meres Dr Carron Curie, NP Robbie Lis, [place] [facility], [place] Karle Plumber, PharmD   Please be sure to bring in all your medications bottles to every appointment.   Need to Contact us:  If you have any questions or concerns before your next appointment please send  [place] a message through [place] or call our office at [phone].    TO LEAVE A MESSAGE FOR THE NURSE SELECT OPTION 2, PLEASE LEAVE A MESSAGE INCLUDING: YOUR NAME DATE OF BIRTH CALL BACK NUMBER REASON FOR CALL**this is important as we prioritize the call backs  YOU WILL RECEIVE A CALL BACK THE SAME DAY AS LONG AS YOU CALL BEFORE 4:00 PM

## 2021-09-24 LAB — CUP PACEART INCLINIC DEVICE CHECK
Battery Remaining Longevity: 87 mo
Brady Statistic RA Percent Paced: 77 %
Brady Statistic RV Percent Paced: 8.7 %
Date Time Interrogation Session: 20230711162400
HighPow Impedance: 75.375
Implantable Lead Implant Date: 20230220
Implantable Lead Implant Date: 20230220
Implantable Lead Location: 753859
Implantable Lead Location: 753860
Implantable Lead Model: 7122
Implantable Pulse Generator Implant Date: 20230220
Lead Channel Impedance Value: 462.5 Ohm
Lead Channel Impedance Value: 462.5 Ohm
Lead Channel Pacing Threshold Amplitude: 0.75 V
Lead Channel Pacing Threshold Amplitude: 0.75 V
Lead Channel Pacing Threshold Amplitude: 1 V
Lead Channel Pacing Threshold Amplitude: 1 V
Lead Channel Pacing Threshold Pulse Width: 0.5 ms
Lead Channel Pacing Threshold Pulse Width: 0.5 ms
Lead Channel Pacing Threshold Pulse Width: 0.5 ms
Lead Channel Pacing Threshold Pulse Width: 0.5 ms
Lead Channel Sensing Intrinsic Amplitude: 11.2 mV
Lead Channel Sensing Intrinsic Amplitude: 4.7 mV
Lead Channel Setting Pacing Amplitude: 2 V
Lead Channel Setting Pacing Amplitude: 2.5 V
Lead Channel Setting Pacing Pulse Width: 0.5 ms
Lead Channel Setting Sensing Sensitivity: 0.5 mV
Pulse Gen Serial Number: 8937527

## 2021-09-25 NOTE — Progress Notes (Signed)
Advanced Heart Failure Clinic Note   Primary Care: Etta Grandchild, MD HF Cardiologist: Dr. Gala Romney   HPI: Sheri Morgan is a 76 y.o. obese woman with fibromyalgia, PAF, PVCs, HTN and systolic HF.   Previously [place] Alonza [place]us [place] Saw Dr. Ashby Dawes in 04/2020 with recurrent AF. Echo 07/2020 with newly discovered LV dysfunction EF 30-35%.   Admitted 04/2021 with CP and worsening SOB. CT negative for dissection. Underwent cath showing only 30% stenosis of proximal LAD and elevated LVEDP EF 20-25%. ? Possible takotsubo.  Echo 20-25% moderate MR. On way to cath developed VT. Started on amiodarone and developed more bradycardia -> PMVT. She had subsequent hypotension and cardiogenic shock requiring DBA gtt. EP consulted and lidocaine gtt started with reduced PVC burden. Lido gtt weaned off and mexiletine started but was stopped. She developed AFL with RVR and underwent DCCV. Initially successful but then had recurrent AFL. Due to bradycardia and need for amiodarone, underwent placement of dual chamber ICD. Then loaded with IV amiodarone and underwent successful TEE/DCCV. She remained on amiodarone for PVCs and mexiletine restarted when PVCs not fully suppressed. cMRI suspicious for cardiac sarcoid and started on treatment protocol. GDMT limited by elevated SCr, discharged home, weight 191 lbs.   She returned for HF follow up 09/23/21. Had been taking 15 mg prednisone daily and 20 mg methotrexate once weekly as instructed. She reported feeling well. Denied any dyspnea, orthopnea or PND. Weight was trending down, 179 lbs in clinic >> 183 lbs last f/u in May. She reported mild LE edema, uses Lasix PRN. Had only needed 4 doses since her hospitalization. She lives alone and maintains her home independently. Noted her blood pressure tends to average 120/70 at home.   Today she returns to HF clinic for pharmacist medication titration. At last visit with MD, losartan was increased to 50 mg daily.  Additionally, prednisone was decreased to 10 mg daily and Bactrim was discontinued per protocol. Overall she is feeling well today. She does have some dizziness after taking her medications but this is not new. SBPs at home have been in the 140s. BP in clinic 152/84. No CP or palpitations. No SOB/DOE. Weight was 177 lbs at home today. States she feels like she has some extra fluid and was planning to take a Lasix tablet today. Has trace LEE on exam. States she uses PRN Lasix infrequently. No PND or orthopnea. She gets confused easily with medication changes. Per her report, she was able to make all the medication changes listed after last visit but she also accidentally stopped the spironolactone.    Cardiac Sarcoidosis Medications:  Prednisone 10 mg daily  Methotrexate 20 mg once weekly (every Thursday) Folic acid 1 mg daily Pantoprazole 40 mg daily Vitamin D 1000 IU daily Calcium citrate 950 mg BID  HF Medications: Losartan 50 mg daily Farxiga 10 mg daily Spironolactone 12.5 mg daily Furosemide 40 mg PRN Potassium chloride 20 mEq when taking furosemide  Has the patient been experiencing any side effects to the medications prescribed?   No  Does the patient have any problems obtaining medications due to transportation or finances?    BCBS Medicare - States [place] and Eliquis are expensive. Provided samples of Eliquis in Clinic. She is aware she needs to bring in OOP expense report so that BMS application can continue to be processed. Reminded her she has Orthoptist for [place] and it will also work for the [employer] (see below).   Understanding of regimen:  fair Understanding of indications: fair Potential of compliance: good Patient understands to avoid NSAIDs. Patient understands to avoid decongestants.    Pertinent Lab Values: Labs 09/23/21: Serum creatinine 1.60, BUN 23, Potassium 3.8, Sodium 141, Hgb 13.4, Plts: 135 BMET today: Serum creatinine 1.30, BUN 16, Potassium 4.1,  Sodium 141  Vital Signs:  Weight: 181 lbs (last clinic weight: 179.8 lbs) Blood pressure: 152/84 mmHg Heart rate: 60 bpm  Assessment/Plan: 1.  Chronic systolic HF due to NICM  - Echo (07/2020): EF 30-35% - Cath (04/2021): minimal CAD. Hs-troponin 204 -> 379 - Echo (04/2021): EF 20-25% - cMRI (04/2021): EF 11% RVEF 15% LGE pattern concerning for sarcoid. (D/w Dr. Bjorn Pippin)  - NYHA II, Volume status mildly elevated. - BMET today stable - Continue Lasix 40 mg PRN weight gain. Take potassium 20 mEq with each Lasix dose.  -Stop Losartan and start Entresto 24/26 mg BID. Repeat BMET in 10-14 days.  -Continue Farxiga 10 mg daily -Restart spironolactone 12.5 mg daily  - Holding b-blocker with recent bradycardia. Plan to add back as able.   2. Cardiac sarcoidosis - based on cMRI and clinical course - ACE level normal (52 U/L). CT chest. No extra-cardiac sarcoid. - Set up PET Scan at [facility] once prednisone is weaned down <10 mg.  -Decrease prednisone to 7.5 mg daily. See prednisone taper plan below:  Weeks 19-20 10/08/21 through 10/21/21 7.5 mg (one and a half 5 mg tablets) daily  Weeks 21-22 10/22/21 through 11/04/21 5 mg (one 5 mg tablet) daily  Weeks 23-24 11/05/21 through 11/18/21 2.5 mg (half of a 5 mg tablet) daily  Week 25 11/19/21 Stop prednisone   -Continue methotrexate 20 mg (8 tablets) weekly on Thursday per titration protocol -Bactrim now discontinued per protocol -Continue Folic Acid 1 mg daily  -Continue Calcium Citrate twice daily  -Continue vitamin D 1000 IU total daily  -Continue protonix 40 mg daily while on prednisone per titration protocol   3. PVCs/NSVT/PMVT - EP following.  - did not tolerate amiodarone previously due to worsening bradycardia - S/p ICD 04/2021. - Continue amiodarone 200 mg daily + mexiletine 200 mg BID for suppression.    4. Paroxysmal Atrial Flutter - S/p DCCV 2/16 -> back in AFL on 05/02/2021.  - s/p TEE/DCCV 05/08/2021-->NSR - Continue Eliquis 5 mg BID.  No bleeding issues. - Continue amiodarone 200 mg daily.   5. H/o AKI - Scr stable at 1.3 on BMET today   6. Thrombocytopenia - Platelets 142>133>92>125  - No bleeding issues. - May be due to MTX. Will need to follow closely.   7. Hypothyroidism -Continue levothyroxine 200 mcg daily  Follow up 6 weeks with Dr. Elyse Jarvis, PharmD, BCPS, [facility], CPP Heart Failure Clinic Pharmacist [phone]

## 2021-10-01 ENCOUNTER — Telehealth (HOSPITAL_COMMUNITY): Payer: Self-pay | Admitting: *Deleted

## 2021-10-05 NOTE — Telephone Encounter (Signed)
Order form, dem, and ins approval all faxed to [employer] at [phone]

## 2021-10-07 ENCOUNTER — Ambulatory Visit (HOSPITAL_COMMUNITY)
Admission: RE | Admit: 2021-10-07 | Discharge: 2021-10-07 | Disposition: A | Discharge status: Home / Self Care | Payer: Medicare Other | Source: Ambulatory Visit | Attending: Internal Medicine | Admitting: Internal Medicine

## 2021-10-07 ENCOUNTER — Telehealth (HOSPITAL_COMMUNITY): Payer: Self-pay | Admitting: Pharmacist

## 2021-10-07 ENCOUNTER — Other Ambulatory Visit (HOSPITAL_COMMUNITY): Payer: Self-pay

## 2021-10-07 VITALS — BP 152/84 | HR 60 | Wt 181.0 lb

## 2021-10-07 DIAGNOSIS — I4892 Unspecified atrial flutter: Secondary | ICD-10-CM | POA: Diagnosis not present

## 2021-10-07 DIAGNOSIS — N179 Acute kidney failure, unspecified: Secondary | ICD-10-CM | POA: Diagnosis not present

## 2021-10-07 DIAGNOSIS — E039 Hypothyroidism, unspecified: Secondary | ICD-10-CM | POA: Insufficient documentation

## 2021-10-07 DIAGNOSIS — I472 Ventricular tachycardia, unspecified: Secondary | ICD-10-CM | POA: Insufficient documentation

## 2021-10-07 DIAGNOSIS — I428 Other cardiomyopathies: Secondary | ICD-10-CM | POA: Insufficient documentation

## 2021-10-07 DIAGNOSIS — Z7901 Long term (current) use of anticoagulants: Secondary | ICD-10-CM | POA: Diagnosis not present

## 2021-10-07 DIAGNOSIS — D8689 Sarcoidosis of other sites: Secondary | ICD-10-CM | POA: Diagnosis not present

## 2021-10-07 DIAGNOSIS — M797 Fibromyalgia: Secondary | ICD-10-CM | POA: Insufficient documentation

## 2021-10-07 DIAGNOSIS — I493 Ventricular premature depolarization: Secondary | ICD-10-CM | POA: Insufficient documentation

## 2021-10-07 DIAGNOSIS — E669 Obesity, unspecified: Secondary | ICD-10-CM | POA: Diagnosis not present

## 2021-10-07 DIAGNOSIS — I110 Hypertensive heart disease with heart failure: Secondary | ICD-10-CM | POA: Diagnosis not present

## 2021-10-07 DIAGNOSIS — I5022 Chronic systolic (congestive) heart failure: Secondary | ICD-10-CM | POA: Insufficient documentation

## 2021-10-07 DIAGNOSIS — D696 Thrombocytopenia, unspecified: Secondary | ICD-10-CM | POA: Diagnosis not present

## 2021-10-07 LAB — BASIC METABOLIC PANEL
Anion gap: 7 (ref 5–15)
BUN: 16 mg/dL (ref 8–23)
CO2: 25 mmol/L (ref 22–32)
Calcium: 9.3 mg/dL (ref 8.9–10.3)
Chloride: 109 mmol/L (ref 98–111)
Creatinine, Ser: 1.3 mg/dL — ABNORMAL HIGH (ref 0.44–1.00)
GFR, Estimated: 42 mL/min — ABNORMAL LOW (ref >60)
Glucose, Bld: 93 mg/dL (ref 70–99)
Potassium: 4.1 mmol/L (ref 3.5–5.1)
Sodium: 141 mmol/L (ref 135–145)

## 2021-10-07 MED ORDER — DAPAGLIFLOZIN PROPANEDIOL 10 MG PO TABS
10.0000 mg | ORAL_TABLET | Freq: Every day | ORAL | 3 refills | Status: DC
  Filled 2021-10-07 – 2021-10-10 (×2): qty 90, 90d supply, fill #0

## 2021-10-07 MED ORDER — SACUBITRIL-VALSARTAN 24-26 MG PO TABS
1.0000 | ORAL_TABLET | Freq: Two times a day (BID) | ORAL | 3 refills | Status: DC
  Filled 2021-10-07: qty 180, 90d supply, fill #0

## 2021-10-07 NOTE — Telephone Encounter (Signed)
Medication Samples have been provided to the patient.   Drug name: Eliquis     Strength: 5 mg      Qty: 3 boxes                 LOT: [id number] Exp.Date: 06/2023   Dosing instructions: 1 tablet BID   The patient has been instructed regarding the correct time, dose, and frequency of taking this medication, including desired effects and most common side effects.   Mattheu Brodersen, PharmD, BCPS, CPP Heart Failure Clinic Pharmacist [phone]    

## 2021-10-07 NOTE — Patient Instructions (Signed)
It was a pleasure seeing you today!  MEDICATIONS: -We are changing your medications today -Decrease prednisone to 7.5 mg daily. Follow predisone taper: Weeks 19-20 10/08/21 through 10/21/21 7.5 mg (one and a half 5 mg tablets) daily  Weeks 21-22 10/22/21 through 11/04/21 5 mg (one 5 mg tablet) daily  Weeks 23-24 11/05/21 through 11/18/21 2.5 mg (half of a 5 mg tablet) daily  Week 25 11/19/21 Stop prednisone   -STOP Losartan. Start Entresto 24/26 mg (1 tablet) twice daily.   -Please bring out of pocket expense report from pharmacy to HF Clinic so we can apply for patient assistance  - You [facility] Deist and [facility] prescriptions have been sent to [facility]. They will bill your grant so the cost will be $0. They will ship the medication to you.   -Call if you have questions about your medications.  LABS: -We will call you if your labs need attention.  NEXT APPOINTMENT: Return to clinic in 6 weeks with Dr. Gala Romney.  In general, to take care of your heart failure: -Limit your fluid intake to 2 Liters (half-gallon) per day.   -Limit your salt intake to ideally 2-3 grams (2000-3000 mg) per day. -Weigh yourself daily and record, and bring that "weight diary" to your next appointment.  (Weight gain of 2-3 pounds in 1 day typically means fluid weight.) -The medications for your heart are to help your heart and help you live longer.   -Please contact us before stopping any of your heart medications.  Call the clinic at [phone] with questions or to reschedule future appointments.

## 2021-10-07 NOTE — Telephone Encounter (Signed)
Received fax from [facility] about scheduled PET Scan.Scheduled for August 11,2023 @10 :45 am. Pt aware of date and time.

## 2021-10-08 ENCOUNTER — Other Ambulatory Visit (HOSPITAL_COMMUNITY): Payer: Self-pay

## 2021-10-08 ENCOUNTER — Other Ambulatory Visit (HOSPITAL_COMMUNITY): Payer: Self-pay | Admitting: Physician Assistant

## 2021-10-10 ENCOUNTER — Other Ambulatory Visit (HOSPITAL_COMMUNITY): Payer: Self-pay

## 2021-10-10 ENCOUNTER — Other Ambulatory Visit (HOSPITAL_COMMUNITY): Payer: Self-pay | Admitting: Internal Medicine

## 2021-10-20 ENCOUNTER — Ambulatory Visit (HOSPITAL_COMMUNITY)
Admission: RE | Admit: 2021-10-20 | Discharge: 2021-10-20 | Disposition: A | Discharge status: Home / Self Care | Payer: Medicare Other | Source: Ambulatory Visit | Attending: Cardiology | Admitting: Cardiology

## 2021-10-20 DIAGNOSIS — D8685 Sarcoid myocarditis: Secondary | ICD-10-CM | POA: Diagnosis not present

## 2021-10-20 DIAGNOSIS — I5042 Chronic combined systolic (congestive) and diastolic (congestive) heart failure: Secondary | ICD-10-CM | POA: Diagnosis not present

## 2021-10-20 DIAGNOSIS — I5022 Chronic systolic (congestive) heart failure: Secondary | ICD-10-CM

## 2021-10-20 LAB — COMPREHENSIVE METABOLIC PANEL
ALT: 33 U/L (ref 0–44)
AST: 31 U/L (ref 15–41)
Albumin: 3.7 g/dL (ref 3.5–5.0)
Alkaline Phosphatase: 84 U/L (ref 38–126)
Anion gap: 11 (ref 5–15)
BUN: 18 mg/dL (ref 8–23)
CO2: 23 mmol/L (ref 22–32)
Calcium: 9.6 mg/dL (ref 8.9–10.3)
Chloride: 109 mmol/L (ref 98–111)
Creatinine, Ser: 1.5 mg/dL — ABNORMAL HIGH (ref 0.44–1.00)
GFR, Estimated: 36 mL/min — ABNORMAL LOW (ref >60)
Glucose, Bld: 90 mg/dL (ref 70–99)
Potassium: 4 mmol/L (ref 3.5–5.1)
Sodium: 143 mmol/L (ref 135–145)
Total Bilirubin: 0.7 mg/dL (ref 0.3–1.2)
Total Protein: 6.4 g/dL — ABNORMAL LOW (ref 6.5–8.1)

## 2021-10-21 NOTE — Telephone Encounter (Signed)
OOP sent to BMS via fax.  Will follow up.

## 2021-10-22 NOTE — Telephone Encounter (Signed)
Advanced Heart Failure Patient Advocate Encounter   Patient was approved to receive Eliquis from [employer]MS  Effective dates: 10/21/21 through 03/14/22  Document scanned to chart. Called and left the patient a message.   Archer Asa, CPhT

## 2021-10-26 ENCOUNTER — Other Ambulatory Visit (HOSPITAL_COMMUNITY): Payer: Self-pay

## 2021-10-26 MED ORDER — APIXABAN 5 MG PO TABS: 5.0000 mg | ORAL_TABLET | Freq: Two times a day (BID) | ORAL | 1 refills | Status: DC

## 2021-10-30 ENCOUNTER — Other Ambulatory Visit (HOSPITAL_COMMUNITY): Payer: Self-pay | Admitting: Physician Assistant

## 2021-11-02 ENCOUNTER — Ambulatory Visit (INDEPENDENT_AMBULATORY_CARE_PROVIDER_SITE_OTHER): Payer: Medicare Other

## 2021-11-02 DIAGNOSIS — I5022 Chronic systolic (congestive) heart failure: Secondary | ICD-10-CM | POA: Diagnosis not present

## 2021-11-03 LAB — CUP PACEART REMOTE DEVICE CHECK
Battery Remaining Longevity: 83 mo
Battery Remaining Percentage: 93 %
Battery Voltage: 3.16 V
Brady Statistic AP VP Percent: 7.6 %
Brady Statistic AP VS Percent: 73 %
Brady Statistic AS VP Percent: 1 %
Brady Statistic AS VS Percent: 18 %
Brady Statistic RA Percent Paced: 79 %
Brady Statistic RV Percent Paced: 8.2 %
Date Time Interrogation Session: 20230821162414
HighPow Impedance: 66 Ohm
HighPow Impedance: 66 Ohm
Implantable Lead Implant Date: 20230220
Implantable Lead Implant Date: 20230220
Implantable Lead Location: 753859
Implantable Lead Location: 753860
Implantable Lead Model: 7122
Implantable Pulse Generator Implant Date: 20230220
Lead Channel Impedance Value: 460 Ohm
Lead Channel Impedance Value: 490 Ohm
Lead Channel Pacing Threshold Amplitude: 0.75 V
Lead Channel Pacing Threshold Amplitude: 0.75 V
Lead Channel Pacing Threshold Pulse Width: 0.5 ms
Lead Channel Pacing Threshold Pulse Width: 0.5 ms
Lead Channel Sensing Intrinsic Amplitude: 11.2 mV
Lead Channel Sensing Intrinsic Amplitude: 4.2 mV
Lead Channel Setting Pacing Amplitude: 2 V
Lead Channel Setting Pacing Amplitude: 2.5 V
Lead Channel Setting Pacing Pulse Width: 0.5 ms
Lead Channel Setting Sensing Sensitivity: 0.5 mV
Pulse Gen Serial Number: 8937527

## 2021-11-05 ENCOUNTER — Telehealth (HOSPITAL_COMMUNITY): Payer: Self-pay

## 2021-11-05 NOTE — Telephone Encounter (Signed)
[facility] called to report that patients recent [facility] PET Scan has been rescheduled to 12/04/21. Please resubmit auth   Charm U. [phone]

## 2021-11-18 NOTE — Progress Notes (Signed)
ADVANCED HF CLINIC  NOTE   Primary Care: Storm Frisk, MD HF Cardiologist: Dr. Gala Romney  HPI: Sheri Morgan is a 76 y.o. obese woman with fibromyalgia, PAF, PVCs, HTN, sarcoid and systolic HF   Previously followed by Dr. Alonza Bogus [place] Saw Dr. Ashby Dawes in 2/22 with recurrent AF. Echo 5/22 with newly discovered LV dysfunction EF 30-35%.   Admitted 2/23 with CP and SOB. CT negative for dissection. Underwent cath showing EF 20-25%.  30% pLAD and elevated LVEDP ? Possible takotsubo.  Echo 20-25% moderate MR. On way to cath developed VT. Started on amiodarone developed more bradycardia -> PMVT. She had subsequent hypotension and cardiogenic shock requiring DBA gtt. EP consulted and lidocaine gtt started with reduced PVC burden. She developed AFL with RVR and underwent DCCV. Initially successful but then had recurrent AFL. Due to bradycardia and need for amiodarone, underwent placement of dual chamber ICD. Then loaded with IV amio and underwent successful TEE/DCCV. She remained on amio for PVCs and mexiletine started when PVCs not fully suppressed. cMRI suspicious for cardiac sarcoid and started on treatment protocol. GDMT limited by elevated SCr, discharged home, weight 191 lbs.  She is here today for HF f/u. Feels pretty good. Does all ADls without problem. Occasional edema. Takes alsix as needed. Taking MTX 20 weekly. Finishes prednisone on Saturday   Device interrogation: Volume ok. No VT Personally reviewed   Cardiac Studies: - Echo (2/23): LVEF of 20-25% with global hypokinesis, mildly enlarged RV with moderately reduced systolic function, mild to moderate MR, and moderately elevated RVSP of 54.0 mmHg.   - LHC (2/23): 30% stenosis of pLAD, elevated LVEDP  - cMRI (2/23): LVEF 11%, RVEF 55%, apical LGE, no LV thrombus, pattern concerning for cardiac sarcoid.  Past Medical History:  Diagnosis Date   Anxiety    Atypical atrial flutter (HCC)    Cardiac sarcoidosis    Chronic combined  systolic and diastolic CHF (congestive heart failure) (HCC)    Fibromyalgia    Hypertension    Non-ischemic cardiomyopathy (HCC)    LHC 04/23/2021: 30% stenosis of proximal LAD   Paroxysmal atrial fibrillation (HCC)    Premature atrial contractions    Premature ventricular contraction    S/P ICD (internal cardiac defibrillator) procedure    Tachy-brady syndrome (HCC)    VT (ventricular tachycardia) (HCC)    Current Outpatient Medications  Medication Sig Dispense Refill   amiodarone (PACERONE) 200 MG tablet Take 1 tablet (200 mg total) by mouth daily. 30 tablet 3   apixaban (ELIQUIS) 5 MG TABS tablet Take 1 tablet (5 mg total) by mouth 2 (two) times daily. 180 tablet 1   calcium citrate (CALCITRATE - DOSED IN MG ELEMENTAL CALCIUM) 950 (200 Ca) MG tablet Take 1 tablet (200 mg of elemental calcium total) by mouth 2 (two) times daily. 30 tablet 6   ciclopirox (PENLAC) 8 % solution PLEASE SEE ATTACHED FOR DETAILED DIRECTIONS 6.6 mL 0   D3-1000 25 MCG (1000 UT) capsule TAKE 1 CAPSULE BY MOUTH EVERY DAY 30 capsule 0   dapagliflozin propanediol (FARXIGA) 10 MG TABS tablet Take 1 tablet (10 mg total) by mouth daily. 90 tablet 3   folic acid (FOLVITE) 1 MG tablet Take 1 tablet (1 mg total) by mouth daily. 30 tablet 3   furosemide (LASIX) 40 MG tablet Take 1 tablet as needed for weight gain > 3 lb in a day or 5 lb in a week, leg edema, shortness of breath 30 tablet 3   levothyroxine (  SYNTHROID) 200 MCG tablet Take 1 tablet (200 mcg total) by mouth daily before breakfast. Take 1 tablet (200 mcg total) by mouth daily before breakfast. Further refills must be from PCP. 90 tablet 2   methotrexate (RHEUMATREX) 2.5 MG tablet TAKE 8 TABLETS BY MOUTH EVERY THURSDAY (Patient taking differently: TAKE 8 TABLETS BY MOUTH EVERY Friday.) 96 tablet 3   mexiletine (MEXITIL) 200 MG capsule Take 1 capsule (200 mg total) by mouth 2 (two) times daily. 60 capsule 3   pantoprazole (PROTONIX) 40 MG tablet Take 1 tablet (40 mg  total) by mouth daily. 30 tablet 3   potassium chloride SA (KLOR-CON M) 20 MEQ tablet Take 1 tablet (20 mEq total) by mouth when taking furosemide 30 tablet 3   prednisoLONE acetate (PRED FORTE) 1 % ophthalmic suspension Place 1 drop into the left eye every evening.     rosuvastatin (CRESTOR) 40 MG tablet TAKE 1 TABLET BY MOUTH EVERY DAY 90 tablet 3   sacubitril-valsartan (ENTRESTO) 24-26 MG Take 1 tablet by mouth 2 (two) times daily. 180 tablet 3   spironolactone (ALDACTONE) 25 MG tablet Take 0.5 tablets (12.5 mg total) by mouth daily. 45 tablet 3   No current facility-administered medications for this encounter.   Allergies  Allergen Reactions   Codeine Itching   Social History   Socioeconomic History   Marital status: Divorced    Spouse name: Not on file   Number of children: Not on file   Years of education: Not on file   Highest education level: Not on file  Occupational History   Not on file  Tobacco Use   Smoking status: Never   Smokeless tobacco: Never  Vaping Use   Vaping Use: Never used  Substance and Sexual Activity   Alcohol use: No   Drug use: No   Sexual activity: Never  Other Topics Concern   Not on file  Social History Narrative   Retired from [employer]   Social Determinants of Health   Financial Resource Strain: Low Risk  (11/13/2020)   Overall Financial Resource Strain (CARDIA)    Difficulty of Paying Living Expenses: Not hard at all  Food Insecurity: Food Insecurity Present (11/19/2021)   Hunger Vital Sign    Worried About Running Out of Food in the Last Year: Sometimes true    Ran Out of Food in the Last Year: Sometimes true  Transportation Needs: No Transportation Needs (11/19/2021)   PRAPARE - Administrator, Civil Service (Medical): No    Lack of Transportation (Non-Medical): No  Physical Activity: Insufficiently Active (11/13/2020)   Exercise Vital Sign    Days of Exercise per Week: 1 day    Minutes of Exercise per Session: 30 min  Stress:  No Stress Concern Present (11/13/2020)   [employer] of Occupational Health - Occupational Stress Questionnaire    Feeling of Stress : Not at all  Social Connections: Moderately Integrated (11/13/2020)   Social Connection and Isolation Panel [NHANES]    Frequency of Communication with Friends and Family: More than three times a week    Frequency of Social Gatherings with Friends and Family: Not on file    Attends Religious Services: More than 4 times per year    Active Member of [employer] or Organizations: Yes    Attends Banker Meetings: Never    Marital Status: Divorced  Catering manager Violence: Not At Risk (11/13/2020)   Humiliation, Afraid, Rape, and Kick questionnaire    Fear of Current  or Ex-Partner: No    Emotionally Abused: No    Physically Abused: No    Sexually Abused: No   Family History  Problem Relation Age of Onset   Diabetes Mother    Alcoholism Father    BP 138/80   Pulse 68   Wt 80.2 kg (176 lb 12.8 oz)   SpO2 100%   BMI 30.83 kg/m   Wt Readings from Last 3 Encounters:  11/19/21 80.2 kg (176 lb 12.8 oz)  10/07/21 82.1 kg (181 lb)  09/23/21 81.6 kg (179 lb 12.8 oz)   PHYSICAL EXAM: General:  Well appearing. No resp difficulty HEENT: normal Neck: supple. no JVD. Carotids 2+ bilat; no bruits. No lymphadenopathy or thryomegaly appreciated. Cor: PMI nondisplaced. Regular rate & rhythm. No rubs, gallops or murmurs. Lungs: clear Abdomen: soft, nontender, nondistended. No hepatosplenomegaly. No bruits or masses. Good bowel sounds. Extremities: no cyanosis, clubbing, rash, edema Neuro: alert & orientedx3, cranial nerves grossly intact. moves all 4 extremities w/o difficulty. Affect pleasant   ASSESSMENT & PLAN: 1.  Chronic systolic HF due to NICM  - Echo (5/22): EF 30-35% - Cath (2/23): minimal CAD. Hs-troponin 204 -> 379 - Echo (2/23): EF 20-25% - cMRI (2/23): EF 11% RVEF 15% LGE pattern concerning for sarcoid. (D/w Dr. Bjorn Pippin)  - NYHA II.  Volume status stable. Continue lasix as needed. Thoracic impedance was down recently but improved with prn lasix  - Increase Entresto to 49/51 bid - Continue spiro 12.5 mg daily - Continue farxiga - Holding b-blocker with hx recent bradycardia/low output - Labs today - Repeat echo    2. Cardiac sarcoidosis - based on cMRI and clinical course - ACE level normal (52 U/L). CT chest. No extra-cardiac sarcoid. - Finishes prednisone this week.  - Continue 20 mg Methotrexate once weekly.  Immunosuppressive Therapy for Cardiac Sarcoid   Weeks 17-18 09/24/21 through 10/07/21 10 mg (two 5 mg tablets) daily Stop taking Bactrim on 09/24/21 (start of week 17)  Weeks 19-20 10/08/21 through 10/21/21 7.5 mg (one and a half 5 mg tablets) daily  Weeks 21-22 10/22/21 through 11/04/21 5 mg (one 5 mg tablet) daily  Weeks 23-24 11/05/21 through 11/18/21 2.5 mg (half of a 5 mg tablet) daily  Week 25 11/19/21 Stop prednisone      Additional Medications  Folic Acid: 1 mg daily  Can stop Protonix Vitamin D: 1000 IU total daily   - Labs today  -Set up PET scan at [facility] in next few weeks, will need another PET in 3-4 months - Will need f/u with Rheum - Cancelled PET. Needs to reschedule  3. PVCs/NSVT/PMVT - EP following.  - did not tolerate amiodarone previously due to worsening bradycardia - S/p ICD 02/23. No VT on device interrogation  - Continue amio 200 mg daily + mexiletine 200 bid for suppression.  - Will leave to EP to adjust/decrease amio    4. Paroxysmal Atrial Flutter - S/p DCCV 2/16 -> back in AFL on 05/02/21.  - s/p TEE/DCCV 05/08/21-->NSR - Continue Eliquis 5 mg bid. No bleeding issues. - Continue amio 200 mg daily.   5. H/o AKI - GDMT as above. - Labs today.   6. Thrombocytopenia - Platelets 142>133>92>113>125 - Check CBC    Arvilla Meres MD 10:07 AM

## 2021-11-19 ENCOUNTER — Encounter (HOSPITAL_COMMUNITY): Payer: Self-pay | Admitting: Internal Medicine

## 2021-11-19 ENCOUNTER — Ambulatory Visit (HOSPITAL_COMMUNITY)
Admission: RE | Admit: 2021-11-19 | Discharge: 2021-11-19 | Disposition: A | Discharge status: Home / Self Care | Payer: Medicare Other | Source: Ambulatory Visit | Attending: Internal Medicine | Admitting: Internal Medicine

## 2021-11-19 VITALS — BP 138/80 | HR 68 | Wt 176.8 lb

## 2021-11-19 DIAGNOSIS — Z139 Encounter for screening, unspecified: Secondary | ICD-10-CM

## 2021-11-19 DIAGNOSIS — Z95810 Presence of automatic (implantable) cardiac defibrillator: Secondary | ICD-10-CM | POA: Insufficient documentation

## 2021-11-19 DIAGNOSIS — M797 Fibromyalgia: Secondary | ICD-10-CM | POA: Insufficient documentation

## 2021-11-19 DIAGNOSIS — I472 Ventricular tachycardia, unspecified: Secondary | ICD-10-CM | POA: Insufficient documentation

## 2021-11-19 DIAGNOSIS — I10 Essential (primary) hypertension: Secondary | ICD-10-CM

## 2021-11-19 DIAGNOSIS — I5022 Chronic systolic (congestive) heart failure: Secondary | ICD-10-CM | POA: Insufficient documentation

## 2021-11-19 DIAGNOSIS — I4892 Unspecified atrial flutter: Secondary | ICD-10-CM | POA: Diagnosis not present

## 2021-11-19 DIAGNOSIS — I110 Hypertensive heart disease with heart failure: Secondary | ICD-10-CM | POA: Diagnosis not present

## 2021-11-19 DIAGNOSIS — D696 Thrombocytopenia, unspecified: Secondary | ICD-10-CM | POA: Insufficient documentation

## 2021-11-19 DIAGNOSIS — Z79899 Other long term (current) drug therapy: Secondary | ICD-10-CM | POA: Diagnosis not present

## 2021-11-19 DIAGNOSIS — I480 Paroxysmal atrial fibrillation: Secondary | ICD-10-CM | POA: Insufficient documentation

## 2021-11-19 DIAGNOSIS — Z7984 Long term (current) use of oral hypoglycemic drugs: Secondary | ICD-10-CM | POA: Diagnosis not present

## 2021-11-19 DIAGNOSIS — E669 Obesity, unspecified: Secondary | ICD-10-CM | POA: Insufficient documentation

## 2021-11-19 DIAGNOSIS — I2510 Atherosclerotic heart disease of native coronary artery without angina pectoris: Secondary | ICD-10-CM | POA: Insufficient documentation

## 2021-11-19 DIAGNOSIS — I428 Other cardiomyopathies: Secondary | ICD-10-CM | POA: Insufficient documentation

## 2021-11-19 DIAGNOSIS — D8685 Sarcoid myocarditis: Secondary | ICD-10-CM | POA: Diagnosis not present

## 2021-11-19 DIAGNOSIS — Z7901 Long term (current) use of anticoagulants: Secondary | ICD-10-CM | POA: Insufficient documentation

## 2021-11-19 DIAGNOSIS — N179 Acute kidney failure, unspecified: Secondary | ICD-10-CM | POA: Insufficient documentation

## 2021-11-19 DIAGNOSIS — I493 Ventricular premature depolarization: Secondary | ICD-10-CM | POA: Insufficient documentation

## 2021-11-19 DIAGNOSIS — I5042 Chronic combined systolic (congestive) and diastolic (congestive) heart failure: Secondary | ICD-10-CM

## 2021-11-19 LAB — CBC WITH DIFFERENTIAL/PLATELET
Abs Immature Granulocytes: 0.07 10*3/uL (ref 0.00–0.07)
Basophils Absolute: 0.1 10*3/uL (ref 0.0–0.1)
Basophils Relative: 1 %
Eosinophils Absolute: 0.1 10*3/uL (ref 0.0–0.5)
Eosinophils Relative: 1 %
HCT: 44.1 % (ref 36.0–46.0)
Hemoglobin: 14.1 g/dL (ref 12.0–15.0)
Immature Granulocytes: 1 %
Lymphocytes Relative: 24 %
Lymphs Abs: 1.4 10*3/uL (ref 0.7–4.0)
MCH: 33.1 pg (ref 26.0–34.0)
MCHC: 32 g/dL (ref 30.0–36.0)
MCV: 103.5 fL — ABNORMAL HIGH (ref 80.0–100.0)
Monocytes Absolute: 0.5 10*3/uL (ref 0.1–1.0)
Monocytes Relative: 10 %
Neutro Abs: 3.5 10*3/uL (ref 1.7–7.7)
Neutrophils Relative %: 63 %
Platelets: 144 10*3/uL — ABNORMAL LOW (ref 150–400)
RBC: 4.26 MIL/uL (ref 3.87–5.11)
RDW: 17.1 % — ABNORMAL HIGH (ref 11.5–15.5)
WBC: 5.5 10*3/uL (ref 4.0–10.5)
nRBC: 0 % (ref 0.0–0.2)

## 2021-11-19 LAB — COMPREHENSIVE METABOLIC PANEL
ALT: 22 U/L (ref 0–44)
AST: 27 U/L (ref 15–41)
Albumin: 4 g/dL (ref 3.5–5.0)
Alkaline Phosphatase: 90 U/L (ref 38–126)
Anion gap: 9 (ref 5–15)
BUN: 20 mg/dL (ref 8–23)
CO2: 25 mmol/L (ref 22–32)
Calcium: 9.4 mg/dL (ref 8.9–10.3)
Chloride: 104 mmol/L (ref 98–111)
Creatinine, Ser: 1.64 mg/dL — ABNORMAL HIGH (ref 0.44–1.00)
GFR, Estimated: 32 mL/min — ABNORMAL LOW (ref >60)
Glucose, Bld: 110 mg/dL — ABNORMAL HIGH (ref 70–99)
Potassium: 3.6 mmol/L (ref 3.5–5.1)
Sodium: 138 mmol/L (ref 135–145)
Total Bilirubin: 0.4 mg/dL (ref 0.3–1.2)
Total Protein: 6.9 g/dL (ref 6.5–8.1)

## 2021-11-19 LAB — BRAIN NATRIURETIC PEPTIDE: B Natriuretic Peptide: 52.6 pg/mL (ref 0.0–100.0)

## 2021-11-19 MED ORDER — ENTRESTO 49-51 MG PO TABS: 1.0000 | ORAL_TABLET | Freq: Two times a day (BID) | ORAL | 6 refills | Status: DC

## 2021-11-19 NOTE — Patient Instructions (Signed)
Medication Changes:  INCREASE Entresto to 49/51 mg Twice daily   Lab Work:  Labs done today, your results will be available in MyChart, we will contact you for abnormal readings.  Testing/Procedures:  Cardiac PET Scan at [facility] is scheduled for 12/04/21, please keep this appointment  Your physician has requested that you have an echocardiogram. Echocardiography is a painless test that uses sound waves to create images of your heart. It provides your doctor with information about the size and shape of your heart and how well your heart's chambers and valves are working. This procedure takes approximately one hour. There are no restrictions for this procedure.   Referrals:  You have been referred to Dr Dierdre Forth at [facility], they will call you for an appointment  You have been referred to follow-up with Dr Ladona Ridgel in Jan 2024  Special Instructions // Education:  Do the following things EVERYDAY: Weigh yourself in the morning before breakfast. Write it down and keep it in a log. Take your medicines as prescribed Eat low salt foods--Limit salt (sodium) to 2000 mg per day.  Stay as active as you can everyday Limit all fluids for the day to less than 2 liters   Follow-Up in: 4 months  At the Advanced Heart Failure Clinic, you and your health needs are our priority. We have a designated team specialized in the treatment of Heart Failure. This Care Team includes your primary Heart Failure Specialized Cardiologist (physician), Advanced Practice Providers (APPs- Physician Assistants and Nurse Practitioners), and Pharmacist who all work together to provide you with the care you need, when you need it.   You may see any of the following providers on your designated Care Team at your next follow up:  Dr. Arvilla Meres Dr. Marca Ancona Dr. Marcos Eke, NP Robbie Lis, [place] [facility], [place] Brynda Peon, NP Karle Plumber, PharmD   Please  be sure to bring in all your medications bottles to every appointment.   Need to Contact us:  If you have any questions or concerns before your next appointment please send [place] a message through [place] or call our office at [phone].    TO LEAVE A MESSAGE FOR THE NURSE SELECT OPTION 2, PLEASE LEAVE A MESSAGE INCLUDING: YOUR NAME DATE OF BIRTH CALL BACK NUMBER REASON FOR CALL**this is important as we prioritize the call backs  YOU WILL RECEIVE A CALL BACK THE SAME DAY AS LONG AS YOU CALL BEFORE 4:00 PM

## 2021-11-19 NOTE — Addendum Note (Signed)
Encounter addended by: Noralee Space, RN on: 11/19/2021 10:42 AM  Actions taken: Visit diagnoses modified, Order list changed, Diagnosis association updated, Clinical Note Signed, Charge Capture section accepted

## 2021-11-19 NOTE — Addendum Note (Signed)
Encounter addended by: Theresia Bough, CMA on: 11/19/2021 10:19 AM  Actions taken: Visit diagnoses modified, Order list changed, Diagnosis association updated

## 2021-11-20 ENCOUNTER — Telehealth (HOSPITAL_COMMUNITY): Payer: Self-pay | Admitting: Licensed Clinical Social Worker

## 2021-11-20 NOTE — Telephone Encounter (Signed)
H&V Care Navigation CSW Progress Note  Clinical Social Worker consulted to speak with pt regarding food insecurity  Pt reported concerns with food insecurity during clinic visit.  CSW followed up with pt by phone to discuss.  Pt reports she lives on her own and has had a large amounts of medical cost recently that have made it hard to purchase sufficient healthy food.   Pt working on medication costs with pharmacy advocate and appears to have resolved her main concerns.  Has outstanding bill with the hospital that she will plan to make payment plan for.  Pt given food bag in clinic as well as list of local food pantries.  CSW discussed applying for food stamps- given pt income she would likely get the minimum amount if she was approved but provided information on where to apply to pt- she will consider following up.  CSW also discussed moms meals referral through [facility] and pt is agreeable- referral sent in and meals to be sent in 2-3 business days.  Pt thinks this will just be a short term problem for her so appreciative of the support- no further concerns at this time  SDOH Screenings   Food Insecurity: Food Insecurity Present (11/20/2021)  Housing: Low Risk  (11/19/2021)  Transportation Needs: No Transportation Needs (11/19/2021)  Utilities: Not At Risk (11/19/2021)  Alcohol Screen: Low Risk  (11/13/2020)  Depression (PHQ2-9): Medium Risk (09/16/2021)  Financial Resource Strain: Low Risk  (11/13/2020)  Physical Activity: Insufficiently Active (11/13/2020)  Social Connections: Moderately Integrated (11/13/2020)  Stress: No Stress Concern Present (11/13/2020)  Tobacco Use: Low Risk  (11/19/2021)   Burna Sis, LCSW Clinical Social Worker Advanced Heart Failure Clinic Desk#: [phone] Cell#: [phone]

## 2021-11-26 ENCOUNTER — Ambulatory Visit (HOSPITAL_COMMUNITY)
Admission: RE | Admit: 2021-11-26 | Discharge: 2021-11-26 | Disposition: A | Discharge status: Home / Self Care | Payer: Medicare Other | Source: Ambulatory Visit | Attending: Critical Care Medicine | Admitting: Critical Care Medicine

## 2021-11-26 ENCOUNTER — Other Ambulatory Visit (HOSPITAL_COMMUNITY): Payer: Self-pay | Admitting: Cardiology

## 2021-11-26 DIAGNOSIS — I509 Heart failure, unspecified: Secondary | ICD-10-CM | POA: Insufficient documentation

## 2021-11-26 DIAGNOSIS — I5022 Chronic systolic (congestive) heart failure: Secondary | ICD-10-CM | POA: Insufficient documentation

## 2021-11-26 DIAGNOSIS — I4892 Unspecified atrial flutter: Secondary | ICD-10-CM | POA: Diagnosis not present

## 2021-11-26 DIAGNOSIS — I493 Ventricular premature depolarization: Secondary | ICD-10-CM | POA: Diagnosis not present

## 2021-11-26 LAB — ECHOCARDIOGRAM COMPLETE
Area-P 1/2: 2.61 cm2
S' Lateral: 3.3 cm

## 2021-11-28 NOTE — Progress Notes (Signed)
Remote ICD transmission.   

## 2021-12-04 DIAGNOSIS — R079 Chest pain, unspecified: Secondary | ICD-10-CM | POA: Diagnosis not present

## 2021-12-04 DIAGNOSIS — Z95810 Presence of automatic (implantable) cardiac defibrillator: Secondary | ICD-10-CM | POA: Diagnosis not present

## 2021-12-04 DIAGNOSIS — I10 Essential (primary) hypertension: Secondary | ICD-10-CM | POA: Diagnosis not present

## 2021-12-09 ENCOUNTER — Other Ambulatory Visit (HOSPITAL_COMMUNITY): Payer: Self-pay | Admitting: Physician Assistant

## 2021-12-14 ENCOUNTER — Other Ambulatory Visit (HOSPITAL_COMMUNITY): Payer: Self-pay | Admitting: Physician Assistant

## 2021-12-15 IMAGING — MR MR HEAD W/O CM
12 series · 48 of 48 positions shown · non-contrast
Comparison: MRI Saturday August, 2007.

CLINICAL DATA: Numbness or tingling, paresthesias.  Prior fall.

EXAM:
MRI HEAD WITHOUT CONTRAST
TECHNIQUE: Multiplanar, multiecho pulse sequences of the brain and surrounding
structures were obtained without intravenous contrast.

[Series 5: T1 · sagittal · 4.0mm · 0.75mm/px · 2 of 31 slices shown (1 of 2)]
[im 1/31]
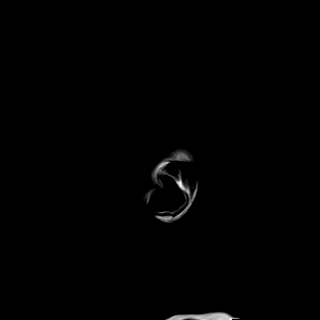
[im 31/31]
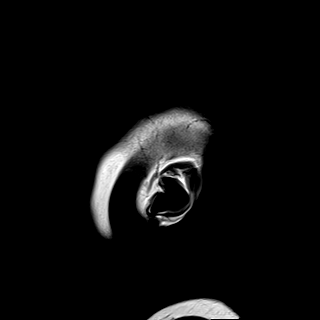

[Series 6: DWI · axial · 3.0mm · 0.94mm/px · z∈[-106,+35]mm · 8 of 160 slices shown (1 of 2)]
[im 1/160]
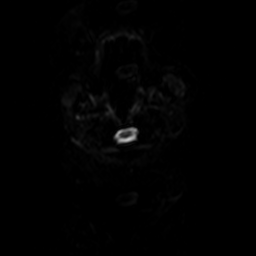
[im 23/160]
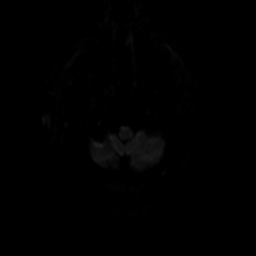
[im 46/160]
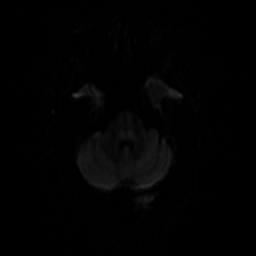
[im 69/160]
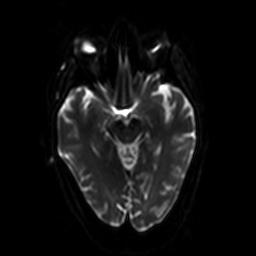
[im 91/160]
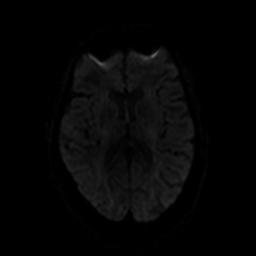
[im 114/160]
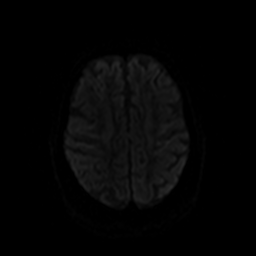
[im 137/160]
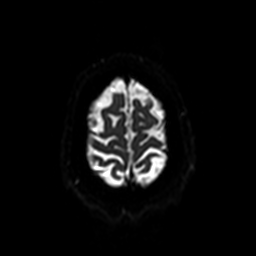
[im 160/160]
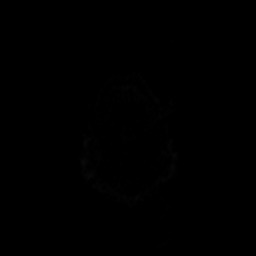

[Series 7: ax dwi_tracew · axial · 3.0mm · 0.94mm/px · z∈[-106,+35]mm · 4 of 80 slices shown]
[im 1/80]
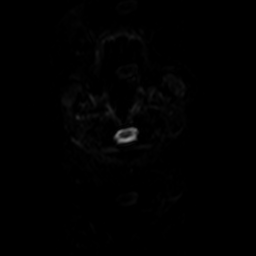
[im 27/80]
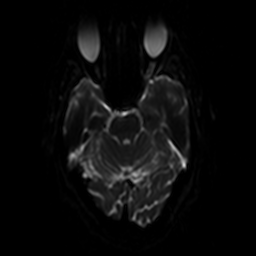
[im 53/80]
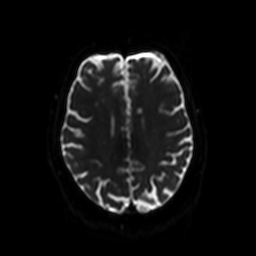
[im 80/80]
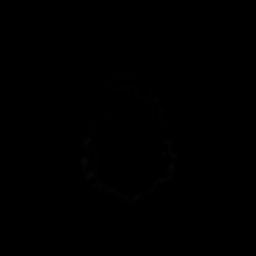

[Series 8: ax dwi_adc · axial · 3.0mm · 0.94mm/px · z∈[-106,+35]mm · 2 of 40 slices shown]
[im 1/40]
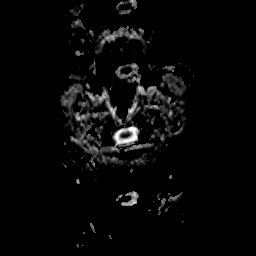
[im 40/40]
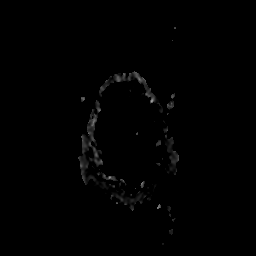

[Series 9: DWI · coronal · 4.0mm · 1.02mm/px · 8 of 155 slices shown (2 of 2)]
[im 1/155]
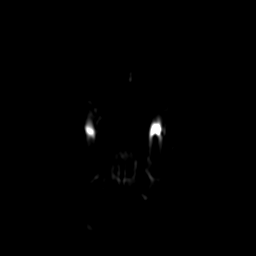
[im 23/155]
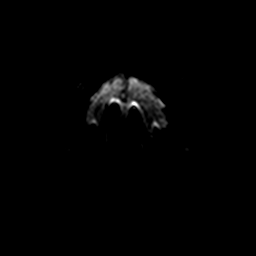
[im 45/155]
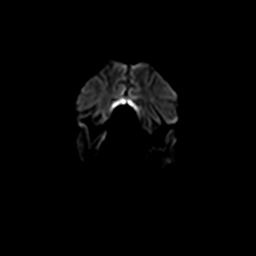
[im 67/155]
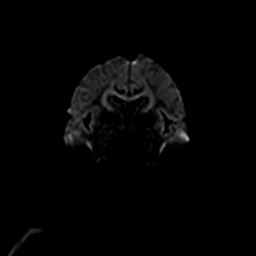
[im 89/155]
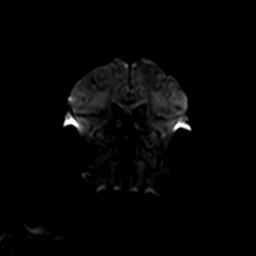
[im 111/155]
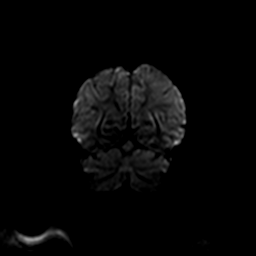
[im 133/155]
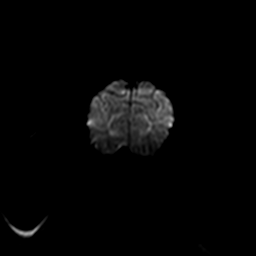
[im 155/155]
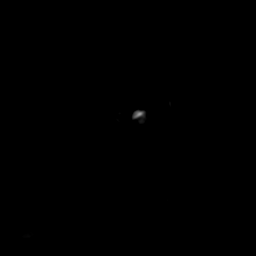

[Series 10: cor dwi_tracew · coronal · 4.0mm · 1.02mm/px · 4 of 79 slices shown]
[im 1/79]
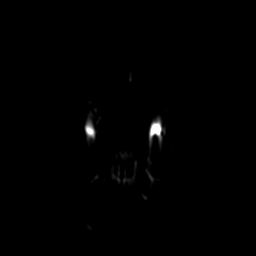
[im 27/79]
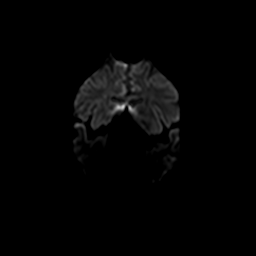
[im 53/79]
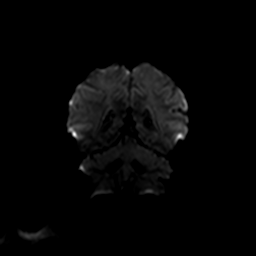
[im 79/79]
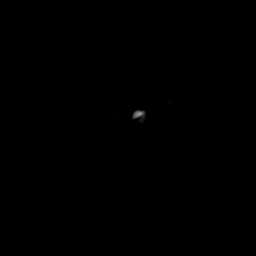

[Series 11: cor dwi_adc · coronal · 4.0mm · 1.02mm/px · 2 of 39 slices shown]
[im 1/39]
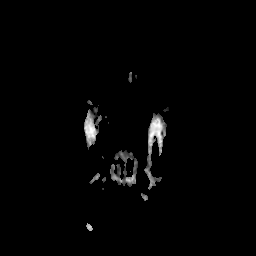
[im 39/39]
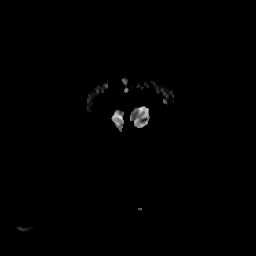

[Series 12: T2 · axial · 4.0mm · 0.36mm/px · z∈[-108,+38]mm · 2 of 29 slices shown (1 of 2)]
[im 1/29]
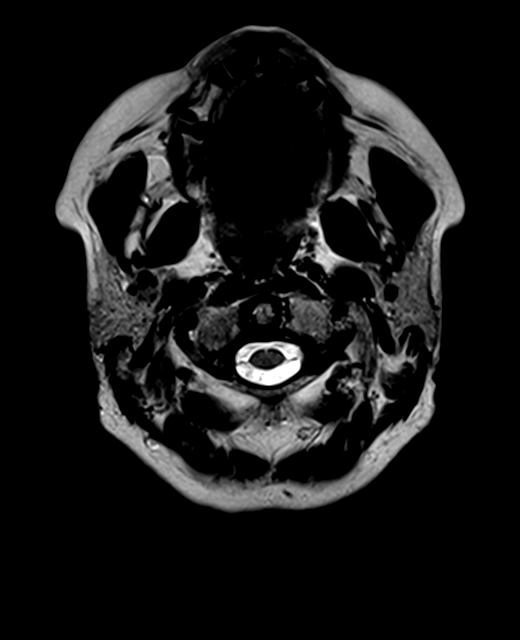
[im 29/29]
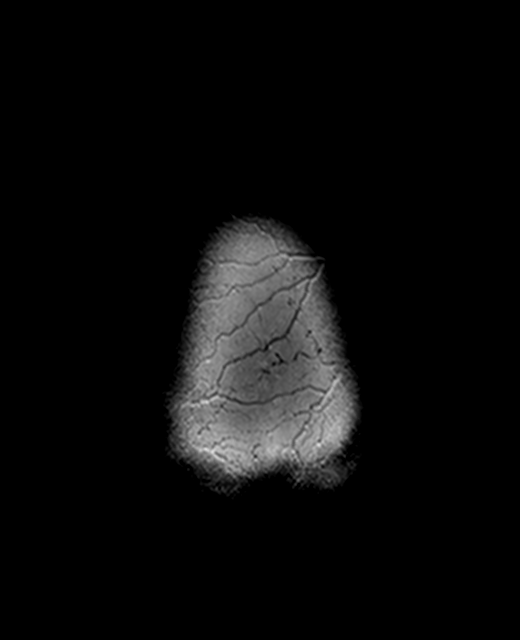

[Series 13: FLAIR · axial · 3.0mm · 0.72mm/px · 1 of 26 slices shown]
[im 1/26]
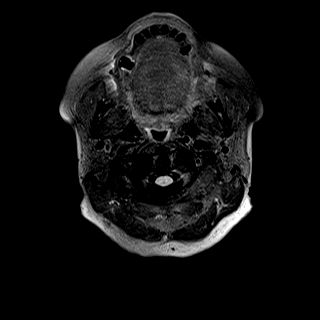

[Series 15: swi_images · axial · 1.5mm · 0.90mm/px · z∈[-106,+36]mm · 5 of 96 slices shown]
[im 1/96]
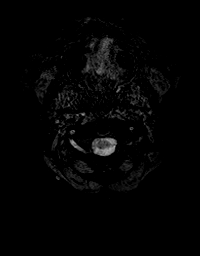
[im 24/96]
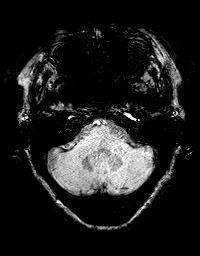
[im 48/96]
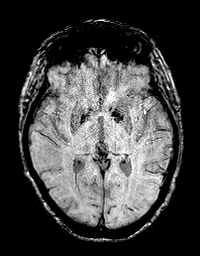
[im 72/96]
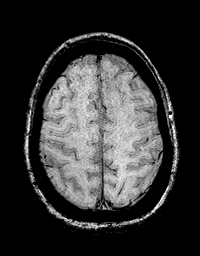
[im 96/96]
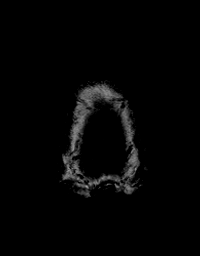

[Series 16: T1 · axial · 1.0mm · 0.94mm/px · z∈[-120,+37]mm · 8 of 159 slices shown (2 of 2)]
[im 1/159]
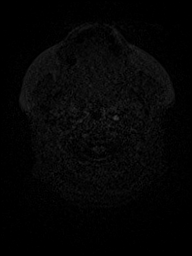
[im 23/159]
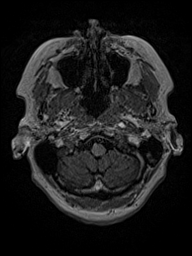
[im 46/159]
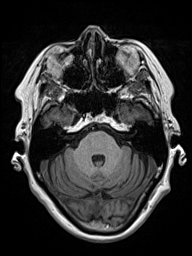
[im 68/159]
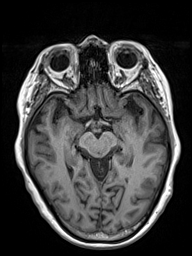
[im 91/159]
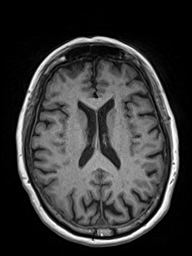
[im 113/159]
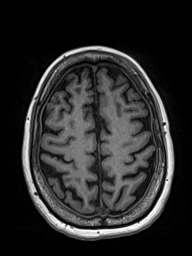
[im 136/159]
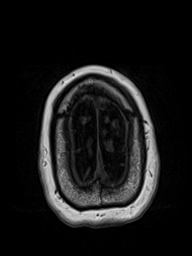
[im 159/159]
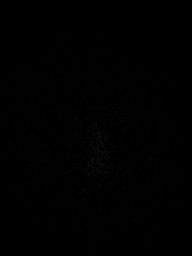

[Series 17: T2 · coronal · 4.5mm · 0.36mm/px · 2 of 32 slices shown (2 of 2)]
[im 1/32]
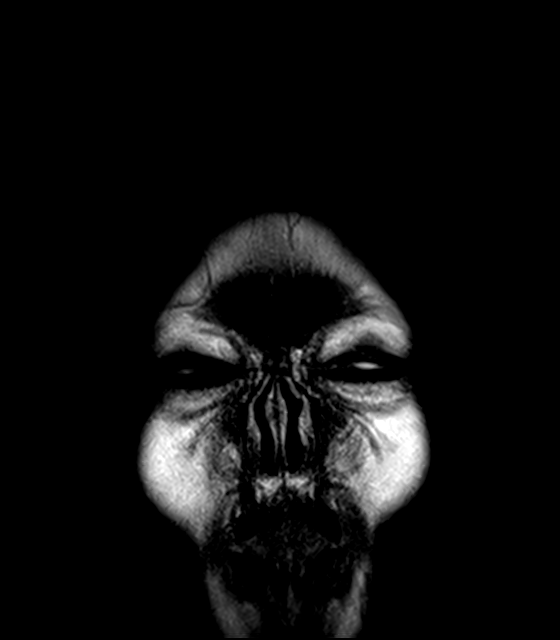
[im 32/32]
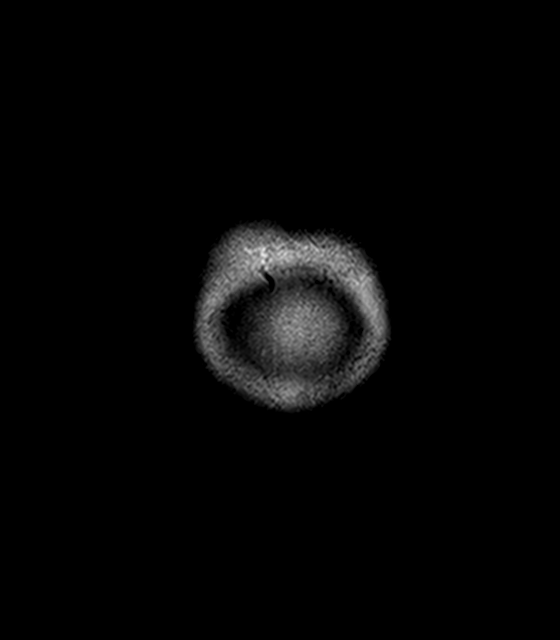

[48 of 48 positions shown; findings below may reference images not displayed]

FINDINGS: Brain: No acute infarction, hemorrhage, hydrocephalus, extra-axial
collection or mass lesion. Interval, but prior clustered small
infarcts in the anterior body of the left paramedian corpus
callosum. Otherwise, mild scattered T2/FLAIR hyperintensities likely
related to chronic microvascular ischemic disease. Normal brain
volume for age.

Vascular: Major arterial flow voids are maintained at the skull
base.

Skull and upper cervical spine: Normal marrow signal.

Sinuses/Orbits: Clear sinuses.  Unremarkable orbits.

Other: No mastoid effusions.
IMPRESSION: 1. No evidence of acute intracranial abnormality.
2. Multiple small interval, but prior infarcts clustered in the left
paramedian anterior body of the corpus callosum. Otherwise, mild
chronic microvascular ischemic disease.

## 2021-12-17 ENCOUNTER — Other Ambulatory Visit (HOSPITAL_COMMUNITY): Payer: Self-pay | Admitting: Internal Medicine

## 2021-12-26 ENCOUNTER — Other Ambulatory Visit (HOSPITAL_COMMUNITY): Payer: Self-pay | Admitting: Internal Medicine

## 2021-12-31 ENCOUNTER — Other Ambulatory Visit (HOSPITAL_COMMUNITY): Payer: Self-pay | Admitting: Cardiology

## 2022-01-05 ENCOUNTER — Other Ambulatory Visit (HOSPITAL_COMMUNITY): Payer: Self-pay | Admitting: Physician Assistant

## 2022-01-08 ENCOUNTER — Telehealth: Payer: Self-pay

## 2022-01-08 ENCOUNTER — Ambulatory Visit: Payer: Medicare Other | Attending: Internal Medicine

## 2022-01-08 DIAGNOSIS — I495 Sick sinus syndrome: Secondary | ICD-10-CM | POA: Diagnosis not present

## 2022-01-08 DIAGNOSIS — I428 Other cardiomyopathies: Secondary | ICD-10-CM | POA: Diagnosis not present

## 2022-01-08 LAB — CUP PACEART INCLINIC DEVICE CHECK
Battery Remaining Longevity: 85 mo
Brady Statistic RA Percent Paced: 83 %
Brady Statistic RV Percent Paced: 8.4 %
Date Time Interrogation Session: 20231027155534
HighPow Impedance: 64.125
Implantable Lead Connection Status: 753985
Implantable Lead Connection Status: 753985
Implantable Lead Implant Date: 20230220
Implantable Lead Implant Date: 20230220
Implantable Lead Location: 753859
Implantable Lead Location: 753860
Implantable Lead Model: 7122
Implantable Pulse Generator Implant Date: 20230220
Lead Channel Impedance Value: 462.5 Ohm
Lead Channel Impedance Value: 462.5 Ohm
Lead Channel Pacing Threshold Amplitude: 0.75 V
Lead Channel Pacing Threshold Amplitude: 0.75 V
Lead Channel Pacing Threshold Amplitude: 1 V
Lead Channel Pacing Threshold Amplitude: 1 V
Lead Channel Pacing Threshold Pulse Width: 0.5 ms
Lead Channel Pacing Threshold Pulse Width: 0.5 ms
Lead Channel Pacing Threshold Pulse Width: 0.5 ms
Lead Channel Pacing Threshold Pulse Width: 0.5 ms
Lead Channel Sensing Intrinsic Amplitude: 11.2 mV
Lead Channel Sensing Intrinsic Amplitude: 2.8 mV
Lead Channel Setting Pacing Amplitude: 2 V
Lead Channel Setting Pacing Amplitude: 2.5 V
Lead Channel Setting Pacing Pulse Width: 0.5 ms
Lead Channel Setting Sensing Sensitivity: 0.5 mV
Pulse Gen Serial Number: 8937527
Zone Setting Status: 755011

## 2022-01-08 NOTE — Progress Notes (Signed)
Patient seen today in device clinic complaining of severe pain and "bulging" at her device site.  Patient was a new ICD implant in 04/2021.   Upon assessment in clinic:  Site presents with some protrusion of device but is normal in placement and appearance.  No signs of swelling or fluid collection under the skin.  Site is cool and normal in skin color and appearance without signs of infection.   The protrusion that is present is normal device bulge through thin skin.  Patient reports that she has lost 30lbs since device was placed last February that could account for the change in appearance.   Patient states that the appearance change was acute and sudden.  Stating this just started overnight and did not look like this yesterday. She woke up with a 9 out of 10 intermittent pain that has continued through the day today.  The pain occurs most with movement of the left arm.  She denies any fall, accident or injury.   Device interrogated today and showed normal device function.   Patient is very anxious about this and would like to see Dr. Lovena Le on Monday to have him evaluate to ensure everything is normal with placement.    Patient educated on s/s of infection and given instructions for OTC pain control for the weekend until she sees the doctor on Monday.

## 2022-01-08 NOTE — Telephone Encounter (Signed)
Patient reports intermittent pain/pressure to her left chest and arm over device.  States that she woke up this morning with pain over her ICD site and noticed that the area over her ICD was "bulging" which it has not been doing.  She is fearful that the device has moved, states it looks "a lot" different than it did yesterday.   New Implant in 04/2021.  Denies any recent fall, accident or injury Denies chest pain anywhere else and states that current pain/pressure comes and goes and is triggered/worse with movement. Denies any SOB, Dizziness, vision changes, numbness or tingling  She is unable to send [place] a picture through mychart/email.  Will need to come in to the office today for [place] to take a look.  Patient is aware if any emergent symptoms (as mentioned above) that she needs to call 911 and go to the ER.  She denies any emergent symptoms but does verbalize understanding if anything changes.   Appointment today with Device clinic at 2:30pm for site check and possible device interrogation. Patient aware and will have someone bring her.

## 2022-01-08 NOTE — Telephone Encounter (Signed)
The patient states her ICD shifted and having some discomfort from it. I told her the nurse will give her a call back. Pt phone number is [phone].

## 2022-01-08 NOTE — Patient Instructions (Addendum)
Thank you for your time today.   Per our discussion:  It is normal for your device to slightly move over time.  There is no sign of acute swelling, redness or infection at site check today.  Continue to monitor the site through the weekend and we will have you see Dr. Lovena Le on Monday at 8:45 am to further assess due to level of pain (rated at 9 intermittently) and concern that you are experiencing.   Take Tylenol or Ibuprofen (whichever you are allowed to take with your medications) through the weekend as needed for your pain.  Monitor for signs of infection: redness, warmth, fever, baseball size swelling or drainage from the healed incision site, if develops go to ER.      Otherwise, we will see you back on Monday with Dr. Lovena Le.

## 2022-01-11 ENCOUNTER — Encounter: Payer: Self-pay | Admitting: Internal Medicine

## 2022-01-11 ENCOUNTER — Ambulatory Visit: Payer: Medicare Other | Attending: Internal Medicine | Admitting: Internal Medicine

## 2022-01-11 VITALS — BP 98/68 | HR 74 | Ht 64.0 in | Wt 177.0 lb

## 2022-01-11 DIAGNOSIS — Z95810 Presence of automatic (implantable) cardiac defibrillator: Secondary | ICD-10-CM | POA: Diagnosis not present

## 2022-01-11 DIAGNOSIS — I495 Sick sinus syndrome: Secondary | ICD-10-CM

## 2022-01-11 DIAGNOSIS — I5042 Chronic combined systolic (congestive) and diastolic (congestive) heart failure: Secondary | ICD-10-CM

## 2022-01-11 MED ORDER — PANTOPRAZOLE SODIUM 40 MG PO TBEC: 40.0000 mg | DELAYED_RELEASE_TABLET | Freq: Every day | ORAL | 3 refills | Status: DC

## 2022-01-11 NOTE — Progress Notes (Signed)
HPI Ms. Mulka returns today for followup. She is a pleasant 76 yo woman with a non-ischemic CM, chronic systolic heart failure, EF 15%, PAF, and VT. She has done well on amiodarone and GDMT for CHF. She has class 2 symptoms. No problems with her ICD. She denies chest pain. No ICD therapies. 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 her main complaint today is pain around her ICD site as well as downward displacement of her ICD. She has lost 40 lbs since implant. Her device has migrated caudally.  Allergies  Allergen Reactions   Codeine Itching     Current Outpatient Medications  Medication Sig Dispense Refill   amiodarone (PACERONE) 200 MG tablet TAKE 1 TABLET BY MOUTH EVERY DAY 90 tablet 3   apixaban (ELIQUIS) 5 MG TABS tablet Take 1 tablet (5 mg total) by mouth 2 (two) times daily. 180 tablet 1   calcium citrate (CALCITRATE - DOSED IN MG ELEMENTAL CALCIUM) 950 (200 Ca) MG tablet Take 1 tablet (200 mg of elemental calcium total) by mouth 2 (two) times daily. 30 tablet 6   ciclopirox (PENLAC) 8 % solution PLEASE SEE ATTACHED FOR DETAILED DIRECTIONS 6.6 mL 0   [employer] D3 25 MCG (1000 UT) capsule TAKE 1 CAPSULE BY MOUTH EVERY DAY 45 capsule 1   dapagliflozin propanediol (FARXIGA) 10 MG TABS tablet Take 1 tablet (10 mg total) by mouth daily. 90 tablet 3   folic acid (FOLVITE) 1 MG tablet Take 1 tablet (1 mg total) by mouth daily. 30 tablet 3   furosemide (LASIX) 40 MG tablet Take 1 tablet as needed for weight gain > 3 lb in a day or 5 lb in a week, leg edema, shortness of breath 30 tablet 3   levothyroxine (SYNTHROID) 200 MCG tablet Take 1 tablet (200 mcg total) by mouth daily before breakfast. Take 1 tablet (200 mcg total) by mouth daily before breakfast. Further refills must be from PCP. 90 tablet 2   methotrexate (RHEUMATREX) 2.5 MG tablet TAKE 8 TABLETS BY MOUTH EVERY THURSDAY (Patient taking differently: TAKE 8 TABLETS BY MOUTH EVERY Friday.) 96 tablet 3   mexiletine (MEXITIL) 200 MG  capsule TAKE 1 CAPSULE BY MOUTH TWICE A DAY 180 capsule 1   potassium chloride SA (KLOR-CON M) 20 MEQ tablet Take 1 tablet (20 mEq total) by mouth when taking furosemide 30 tablet 3   rosuvastatin (CRESTOR) 40 MG tablet TAKE 1 TABLET BY MOUTH EVERY DAY 90 tablet 3   sacubitril-valsartan (ENTRESTO) 49-51 MG Take 1 tablet by mouth 2 (two) times daily. 60 tablet 6   spironolactone (ALDACTONE) 25 MG tablet Take 0.5 tablets (12.5 mg total) by mouth daily. 45 tablet 3   pantoprazole (PROTONIX) 40 MG tablet Take 1 tablet (40 mg total) by mouth daily. 30 tablet 3   prednisoLONE acetate (PRED FORTE) 1 % ophthalmic suspension Place 1 drop into the left eye every evening. (Patient not taking: Reported on 01/11/2022)     No current facility-administered medications for this visit.     Past Medical History:  Diagnosis Date   Anxiety    Atypical atrial flutter [facility])    Cardiac sarcoidosis    Chronic combined systolic and diastolic CHF (congestive heart failure) (HCC)    Fibromyalgia    Hypertension    Non-ischemic cardiomyopathy (HCC)    LHC 04/23/2021: 30% stenosis of proximal LAD   Paroxysmal atrial fibrillation (HCC)    Premature atrial contractions    Premature ventricular contraction  S/P ICD (internal cardiac defibrillator) procedure    Tachy-brady syndrome (HCC)    VT (ventricular tachycardia) (HCC)     ROS:   All systems reviewed and negative except as noted in the HPI.   Past Surgical History:  Procedure Laterality Date   ABDOMINAL HYSTERECTOMY     CARDIOVERSION N/A 04/30/2021   Procedure: CARDIOVERSION;  Surgeon: Dolores Patty, MD;  Location: [facility] ENDOSCOPY;  Service: Cardiovascular;  Laterality: N/A;   CARDIOVERSION N/A 05/08/2021   Procedure: CARDIOVERSION;  Surgeon: Dolores Patty, MD;  Location: [facility] ENDOSCOPY;  Service: Cardiovascular;  Laterality: N/A;   ICD IMPLANT N/A 05/04/2021   Procedure: ICD IMPLANT;  Surgeon: Marinus Maw, MD;  Location: [facility] INVASIVE CV LAB;   Service: Cardiovascular;  Laterality: N/A;   LEFT HEART CATH AND CORONARY ANGIOGRAPHY N/A 04/23/2021   Procedure: LEFT HEART CATH AND CORONARY ANGIOGRAPHY;  Surgeon: Kathleene Hazel, MD;  Location: [facility] INVASIVE CV LAB;  Service: Cardiovascular;  Laterality: N/A;   LUMBAR LAMINECTOMY     TEE WITHOUT CARDIOVERSION N/A 05/08/2021   Procedure: TRANSESOPHAGEAL ECHOCARDIOGRAM (TEE);  Surgeon: Dolores Patty, MD;  Location: [facility] ENDOSCOPY;  Service: Cardiovascular;  Laterality: N/A;   THYROIDECTOMY       Family History  Problem Relation Age of Onset   Diabetes Mother    Alcoholism Father      Social History   Socioeconomic History   Marital status: Divorced    Spouse name: Not on file   Number of children: Not on file   Years of education: Not on file   Highest education level: Not on file  Occupational History   Not on file  Tobacco Use   Smoking status: Never   Smokeless tobacco: Never  Vaping Use   Vaping Use: Never used  Substance and Sexual Activity   Alcohol use: No   Drug use: No   Sexual activity: Never  Other Topics Concern   Not on file  Social History Narrative   Retired from [employer]   Social Determinants of Health   Financial Resource Strain: Low Risk  (11/13/2020)   Overall Financial Resource Strain (CARDIA)    Difficulty of Paying Living Expenses: Not hard at all  Food Insecurity: Food Insecurity Present (11/20/2021)   Hunger Vital Sign    Worried About Running Out of Food in the Last Year: Sometimes true    Ran Out of Food in the Last Year: Sometimes true  Transportation Needs: No Transportation Needs (11/19/2021)   PRAPARE - Administrator, Civil Service (Medical): No    Lack of Transportation (Non-Medical): No  Physical Activity: Insufficiently Active (11/13/2020)   Exercise Vital Sign    Days of Exercise per Week: 1 day    Minutes of Exercise per Session: 30 min  Stress: No Stress Concern Present (11/13/2020)   [employer] of  Occupational Health - Occupational Stress Questionnaire    Feeling of Stress : Not at all  Social Connections: Moderately Integrated (11/13/2020)   Social Connection and Isolation Panel [NHANES]    Frequency of Communication with Friends and Family: More than three times a week    Frequency of Social Gatherings with Friends and Family: Not on file    Attends Religious Services: More than 4 times per year    Active Member of [employer] or Organizations: Yes    Attends Banker Meetings: Never    Marital Status: Divorced  Catering manager Violence: Not At Risk (11/13/2020)   Humiliation,  Afraid, Rape, and Kick questionnaire    Fear of Current or Ex-Partner: No    Emotionally Abused: No    Physically Abused: No    Sexually Abused: No     BP 98/68   Pulse 74   Ht 5\' 4"  (1.626 m)   Wt 177 lb (80.3 kg)   BMI 30.38 kg/m   Physical Exam:  Well appearing NAD HEENT: Unremarkable Neck:  No JVD, no thyromegally Lymphatics:  No adenopathy Back:  No CVA tenderness Lungs:  Clear with no wheezes. Her ICD does not have erythema or swelling but is tender around the margin. HEART:  Regular rate rhythm, no murmurs, no rubs, no clicks Abd:  soft, positive bowel sounds, no organomegally, no rebound, no guarding Ext:  2 plus pulses, no edema, no cyanosis, no clubbing Skin:  No rashes no nodules Neuro:  CN II through XII intact, motor grossly intact  EKG - nsr with occaisional ventricular paced beats.  DEVICE  Normal device function.  See PaceArt for details.   Assess/Plan:   Acute on chronic systolic heart failure - she appears to be euvolemic and her fluid index looks good. She will continue current GDMT. VT - she is quiet on amiodarone and mexitil. Continue.  PAF - she is maintaining NSR.  Coags - she has not had any bleeding on eliquis.  ICD - her St. Jude DDD ICD is working normally. We will follow. She has had some caudal migration with her 40 lb weight loss. Usually this is of no  clinical consequence but we will undergo watchful waiting.    Carleene Overlie Chad Donoghue,MD

## 2022-01-11 NOTE — Patient Instructions (Signed)
Medication Instructions:  Your physician recommends that you continue on your current medications as directed. Please refer to the Current Medication list given to you today.  *If you need a refill on your cardiac medications before your next appointment, please call your pharmacy*  Lab Work: None ordered.  If you have labs (blood work) drawn today and your tests are completely normal, you will receive your results only by: [place] (if you have MyChart) OR A paper copy in the mail If you have any lab test that is abnormal or we need to change your treatment, we will call you to review the results.  Testing/Procedures: None ordered.  Follow-Up: At [facility], you and your health needs are our priority.  As part of our continuing mission to provide you with exceptional heart care, we have created designated Provider Care Teams.  These Care Teams include your primary Cardiologist (physician) and Advanced Practice Providers (APPs -  Physician Assistants and Nurse Practitioners) who all work together to provide you with the care you need, when you need it.  We recommend signing up for the patient portal called "MyChart".  Sign up information is provided on this After Visit Summary.  MyChart is used to connect with patients for Virtual Visits (Telemedicine).  Patients are able to view lab/test results, encounter notes, upcoming appointments, etc.  Non-urgent messages can be sent to your provider as well.   To learn more about what you can do with MyChart, go to [email or web address].    Your next appointment:   1 year(s)  The format for your next appointment:   In Person  Provider:   Cristopher Peru, MD{or one of the following Advanced Practice Providers on your designated Care Team:   Tommye Standard, [place] Legrand Como "Jonni Sanger" Chalmers Cater, [place]  Remote monitoring is used to monitor your ICD from home. This monitoring reduces the number of office visits required to check your device to one  time per year. It allows [place] to keep an eye on the functioning of your device to ensure it is working properly. You are scheduled for a device check from home on 02/01/22. You may send your transmission at any time that day. If you have a wireless device, the transmission will be sent automatically. After your physician reviews your transmission, you will receive a postcard with your next transmission date.  Important Information About Sugar

## 2022-01-19 ENCOUNTER — Ambulatory Visit: Payer: Medicare Other | Admitting: Critical Care Medicine

## 2022-01-20 ENCOUNTER — Ambulatory Visit: Payer: Medicare Other | Admitting: Physician Assistant

## 2022-02-01 ENCOUNTER — Ambulatory Visit (INDEPENDENT_AMBULATORY_CARE_PROVIDER_SITE_OTHER): Payer: Medicare Other

## 2022-02-01 DIAGNOSIS — I428 Other cardiomyopathies: Secondary | ICD-10-CM

## 2022-02-01 LAB — CUP PACEART REMOTE DEVICE CHECK
Battery Remaining Longevity: 79 mo
Battery Remaining Percentage: 90 %
Battery Voltage: 3.08 V
Brady Statistic AP VP Percent: 26 %
Brady Statistic AP VS Percent: 67 %
Brady Statistic AS VP Percent: 1 %
Brady Statistic AS VS Percent: 6.4 %
Brady Statistic RA Percent Paced: 93 %
Brady Statistic RV Percent Paced: 26 %
Date Time Interrogation Session: 20231120020021
HighPow Impedance: 57 Ohm
HighPow Impedance: 57 Ohm
Implantable Lead Connection Status: 753985
Implantable Lead Connection Status: 753985
Implantable Lead Implant Date: 20230220
Implantable Lead Implant Date: 20230220
Implantable Lead Location: 753859
Implantable Lead Location: 753860
Implantable Lead Model: 7122
Implantable Pulse Generator Implant Date: 20230220
Lead Channel Impedance Value: 460 Ohm
Lead Channel Impedance Value: 480 Ohm
Lead Channel Pacing Threshold Amplitude: 0.875 V
Lead Channel Pacing Threshold Amplitude: 1 V
Lead Channel Pacing Threshold Pulse Width: 0.5 ms
Lead Channel Pacing Threshold Pulse Width: 0.5 ms
Lead Channel Sensing Intrinsic Amplitude: 11.2 mV
Lead Channel Sensing Intrinsic Amplitude: 2.3 mV
Lead Channel Setting Pacing Amplitude: 2 V
Lead Channel Setting Pacing Amplitude: 2.5 V
Lead Channel Setting Pacing Pulse Width: 0.5 ms
Lead Channel Setting Sensing Sensitivity: 0.5 mV
Pulse Gen Serial Number: 8937527
Zone Setting Status: 755011

## 2022-02-23 ENCOUNTER — Ambulatory Visit (HOSPITAL_COMMUNITY)
Admission: RE | Admit: 2022-02-23 | Discharge: 2022-02-23 | Disposition: A | Discharge status: Home / Self Care | Payer: Medicare Other | Source: Ambulatory Visit | Attending: Internal Medicine | Admitting: Internal Medicine

## 2022-02-23 ENCOUNTER — Encounter (HOSPITAL_COMMUNITY): Payer: Self-pay | Admitting: Internal Medicine

## 2022-02-23 VITALS — BP 118/70 | HR 60 | Wt 172.6 lb

## 2022-02-23 DIAGNOSIS — Z79899 Other long term (current) drug therapy: Secondary | ICD-10-CM | POA: Diagnosis not present

## 2022-02-23 DIAGNOSIS — M797 Fibromyalgia: Secondary | ICD-10-CM | POA: Insufficient documentation

## 2022-02-23 DIAGNOSIS — I472 Ventricular tachycardia, unspecified: Secondary | ICD-10-CM | POA: Insufficient documentation

## 2022-02-23 DIAGNOSIS — I493 Ventricular premature depolarization: Secondary | ICD-10-CM | POA: Diagnosis not present

## 2022-02-23 DIAGNOSIS — I5042 Chronic combined systolic (congestive) and diastolic (congestive) heart failure: Secondary | ICD-10-CM

## 2022-02-23 DIAGNOSIS — I480 Paroxysmal atrial fibrillation: Secondary | ICD-10-CM | POA: Insufficient documentation

## 2022-02-23 DIAGNOSIS — Z95810 Presence of automatic (implantable) cardiac defibrillator: Secondary | ICD-10-CM | POA: Insufficient documentation

## 2022-02-23 DIAGNOSIS — I428 Other cardiomyopathies: Secondary | ICD-10-CM | POA: Diagnosis not present

## 2022-02-23 DIAGNOSIS — Z7901 Long term (current) use of anticoagulants: Secondary | ICD-10-CM | POA: Diagnosis not present

## 2022-02-23 DIAGNOSIS — E669 Obesity, unspecified: Secondary | ICD-10-CM | POA: Insufficient documentation

## 2022-02-23 DIAGNOSIS — I2510 Atherosclerotic heart disease of native coronary artery without angina pectoris: Secondary | ICD-10-CM | POA: Insufficient documentation

## 2022-02-23 DIAGNOSIS — I110 Hypertensive heart disease with heart failure: Secondary | ICD-10-CM | POA: Diagnosis not present

## 2022-02-23 DIAGNOSIS — I5022 Chronic systolic (congestive) heart failure: Secondary | ICD-10-CM

## 2022-02-23 DIAGNOSIS — D8685 Sarcoid myocarditis: Secondary | ICD-10-CM | POA: Diagnosis not present

## 2022-02-23 DIAGNOSIS — D696 Thrombocytopenia, unspecified: Secondary | ICD-10-CM | POA: Diagnosis not present

## 2022-02-23 LAB — COMPREHENSIVE METABOLIC PANEL
ALT: 20 U/L (ref 0–44)
AST: 27 U/L (ref 15–41)
Albumin: 3.7 g/dL (ref 3.5–5.0)
Alkaline Phosphatase: 114 U/L (ref 38–126)
Anion gap: 9 (ref 5–15)
BUN: 18 mg/dL (ref 8–23)
CO2: 24 mmol/L (ref 22–32)
Calcium: 9.6 mg/dL (ref 8.9–10.3)
Chloride: 106 mmol/L (ref 98–111)
Creatinine, Ser: 1.78 mg/dL — ABNORMAL HIGH (ref 0.44–1.00)
GFR, Estimated: 29 mL/min — ABNORMAL LOW (ref >60)
Glucose, Bld: 88 mg/dL (ref 70–99)
Potassium: 4.2 mmol/L (ref 3.5–5.1)
Sodium: 139 mmol/L (ref 135–145)
Total Bilirubin: 0.6 mg/dL (ref 0.3–1.2)
Total Protein: 6.5 g/dL (ref 6.5–8.1)

## 2022-02-23 LAB — CBC
HCT: 36 % (ref 36.0–46.0)
Hemoglobin: 12.1 g/dL (ref 12.0–15.0)
MCH: 32.4 pg (ref 26.0–34.0)
MCHC: 33.6 g/dL (ref 30.0–36.0)
MCV: 96.5 fL (ref 80.0–100.0)
Platelets: 105 K/uL — ABNORMAL LOW (ref 150–400)
RBC: 3.73 MIL/uL — ABNORMAL LOW (ref 3.87–5.11)
RDW: 17.9 % — ABNORMAL HIGH (ref 11.5–15.5)
WBC: 3.1 K/uL — ABNORMAL LOW (ref 4.0–10.5)
nRBC: 0 % (ref 0.0–0.2)

## 2022-02-23 LAB — T4, FREE: Free T4: 2.05 ng/dL — ABNORMAL HIGH (ref 0.61–1.12)

## 2022-02-23 LAB — TSH: TSH: 0.022 u[IU]/mL — ABNORMAL LOW (ref 0.350–4.500)

## 2022-02-23 LAB — BRAIN NATRIURETIC PEPTIDE: B Natriuretic Peptide: 104.9 pg/mL — ABNORMAL HIGH (ref 0.0–100.0)

## 2022-02-23 MED ORDER — AMIODARONE HCL 200 MG PO TABS: 100.0000 mg | ORAL_TABLET | Freq: Every day | ORAL | 3 refills | Status: DC

## 2022-02-23 MED ORDER — SPIRONOLACTONE 25 MG PO TABS: 25.0000 mg | ORAL_TABLET | Freq: Every day | ORAL | 3 refills | Status: DC

## 2022-02-23 NOTE — Progress Notes (Signed)
[facility]  NOTE   Primary Care: Storm Frisk, MD HF Cardiologist: Dr. Gala Romney  HPI: Sheri Morgan is a 76 y.o. obese woman with fibromyalgia, PAF, PVCs, HTN, sarcoid and systolic HF   Previously [place] by Dr. Alonza Bogus [place] Saw Dr. Ashby Dawes in 2/22 with recurrent AF. Echo 5/22 with newly discovered LV dysfunction EF 30-35%.   Admitted 2/23 with CP and SOB. CT negative for dissection. Underwent cath showing EF 20-25%.  30% pLAD and elevated LVEDP ? Possible takotsubo.  Echo 20-25% moderate MR. On way to cath developed VT. Started on amiodarone developed more bradycardia -> PMVT. She had subsequent hypotension and cardiogenic shock requiring DBA gtt. EP consulted and lidocaine gtt started with reduced PVC burden. She developed AFL with RVR and underwent DCCV. Initially successful but then had recurrent AFL. Due to bradycardia and need for amiodarone, underwent placement of dual chamber ICD. Then loaded with IV amio and underwent successful TEE/DCCV. She remained on amio for PVCs and mexiletine started when PVCs not fully suppressed. cMRI suspicious for cardiac sarcoid and started on treatment protocol. GDMT limited by elevated SCr, discharged home, weight 191 lbs.  She is here today for HF f/u. Feels pretty good. Does all ADls without problem. Occasional edema. Takes lasix as needed. Taking MTX 20 weekly. Completed prednisone in 9/23  Cardiac PET 9/23 EF 51% no FDG uptake.    Cardiac Studies: - Echo (2/23): LVEF of 20-25% with global hypokinesis, mildly enlarged RV with moderately reduced systolic function, mild to moderate MR, and moderately elevated RVSP of 54.0 mmHg.   - LHC (2/23): 30% stenosis of pLAD, elevated LVEDP  - cMRI (2/23): LVEF 11%, RVEF 55%, apical LGE, no LV thrombus, pattern concerning for cardiac sarcoid.  Past Medical History:  Diagnosis Date   Anxiety    Atypical atrial flutter (HCC)    Cardiac sarcoidosis    Chronic combined systolic and diastolic  CHF (congestive heart failure) (HCC)    Fibromyalgia    Hypertension    Non-ischemic cardiomyopathy (HCC)    LHC 04/23/2021: 30% stenosis of proximal LAD   Paroxysmal atrial fibrillation (HCC)    Premature atrial contractions    Premature ventricular contraction    S/P ICD (internal cardiac defibrillator) procedure    Tachy-brady syndrome (HCC)    VT (ventricular tachycardia) (HCC)    Current Outpatient Medications  Medication Sig Dispense Refill   amiodarone (PACERONE) 200 MG tablet TAKE 1 TABLET BY MOUTH EVERY DAY 90 tablet 3   apixaban (ELIQUIS) 5 MG TABS tablet Take 1 tablet (5 mg total) by mouth 2 (two) times daily. 180 tablet 1   calcium citrate (CALCITRATE - DOSED IN MG ELEMENTAL CALCIUM) 950 (200 Ca) MG tablet Take 1 tablet (200 mg of elemental calcium total) by mouth 2 (two) times daily. 30 tablet 6   CVS D3 25 MCG (1000 UT) capsule TAKE 1 CAPSULE BY MOUTH EVERY DAY 45 capsule 1   dapagliflozin propanediol (FARXIGA) 10 MG TABS tablet Take 1 tablet (10 mg total) by mouth daily. 90 tablet 3   folic acid (FOLVITE) 1 MG tablet Take 1 tablet (1 mg total) by mouth daily. 30 tablet 3   furosemide (LASIX) 40 MG tablet Take 1 tablet as needed for weight gain > 3 lb in a day or 5 lb in a week, leg edema, shortness of breath 30 tablet 3   levothyroxine (SYNTHROID) 200 MCG tablet Take 1 tablet (200 mcg total) by mouth daily before breakfast. Take 1  tablet (200 mcg total) by mouth daily before breakfast. Further refills must be from PCP. 90 tablet 2   methotrexate (RHEUMATREX) 2.5 MG tablet TAKE 8 TABLETS BY MOUTH EVERY THURSDAY 96 tablet 3   mexiletine (MEXITIL) 200 MG capsule TAKE 1 CAPSULE BY MOUTH TWICE A DAY 180 capsule 1   pantoprazole (PROTONIX) 40 MG tablet Take 1 tablet (40 mg total) by mouth daily. 30 tablet 3   potassium chloride SA (KLOR-CON M) 20 MEQ tablet Take 1 tablet (20 mEq total) by mouth when taking furosemide 30 tablet 3   rosuvastatin (CRESTOR) 40 MG tablet TAKE 1 TABLET BY  MOUTH EVERY DAY 90 tablet 3   sacubitril-valsartan (ENTRESTO) 49-51 MG Take 1 tablet by mouth 2 (two) times daily. 60 tablet 6   spironolactone (ALDACTONE) 25 MG tablet Take 0.5 tablets (12.5 mg total) by mouth daily. 45 tablet 3   No current facility-administered medications for this encounter.   Allergies  Allergen Reactions   Codeine Itching   Social History   Socioeconomic History   Marital status: Divorced    Spouse name: Not on file   Number of children: Not on file   Years of education: Not on file   Highest education level: Not on file  Occupational History   Not on file  Tobacco Use   Smoking status: Never   Smokeless tobacco: Never  Vaping Use   Vaping Use: Never used  Substance and Sexual Activity   Alcohol use: No   Drug use: No   Sexual activity: Never  Other Topics Concern   Not on file  Social History Narrative   Retired from [employer]   Social Determinants of Health   Financial Resource Strain: Low Risk  (11/13/2020)   Overall Financial Resource Strain (CARDIA)    Difficulty of Paying Living Expenses: Not hard at all  Food Insecurity: Food Insecurity Present (11/20/2021)   Hunger Vital Sign    Worried About Running Out of Food in the Last Year: Sometimes true    Ran Out of Food in the Last Year: Sometimes true  Transportation Needs: No Transportation Needs (11/19/2021)   PRAPARE - Administrator, Civil Service (Medical): No    Lack of Transportation (Non-Medical): No  Physical Activity: Insufficiently Active (11/13/2020)   Exercise Vital Sign    Days of Exercise per Week: 1 day    Minutes of Exercise per Session: 30 min  Stress: No Stress Concern Present (11/13/2020)   [employer] of Occupational Health - Occupational Stress Questionnaire    Feeling of Stress : Not at all  Social Connections: Moderately Integrated (11/13/2020)   Social Connection and Isolation Panel [NHANES]    Frequency of Communication with Friends and Family: More than three  times a week    Frequency of Social Gatherings with Friends and Family: Not on file    Attends Religious Services: More than 4 times per year    Active Member of [employer] or Organizations: Yes    Attends Banker Meetings: Never    Marital Status: Divorced  Catering manager Violence: Not At Risk (11/13/2020)   Humiliation, Afraid, Rape, and Kick questionnaire    Fear of Current or Ex-Partner: No    Emotionally Abused: No    Physically Abused: No    Sexually Abused: No   Family History  Problem Relation Age of Onset   Diabetes Mother    Alcoholism Father    BP 118/70   Pulse 60   Wt  78.3 kg (172 lb 9.6 oz)   SpO2 100%   BMI 29.63 kg/m   Wt Readings from Last 3 Encounters:  02/23/22 78.3 kg (172 lb 9.6 oz)  01/11/22 80.3 kg (177 lb)  11/19/21 80.2 kg (176 lb 12.8 oz)   PHYSICAL EXAM: General:  Well appearing. No resp difficulty HEENT: normal Neck: supple. Jvp 6. Carotids 2+ bilat; no bruits. No lymphadenopathy or thryomegaly appreciated. Cor: PMI nondisplaced. Regular rate & rhythm. No rubs, gallops or murmurs. Lungs: clear Abdomen: soft, nontender, nondistended. No hepatosplenomegaly. No bruits or masses. Good bowel sounds. Extremities: no cyanosis, clubbing, rash, trace edema Neuro: alert & orientedx3, cranial nerves grossly intact. moves all 4 extremities w/o difficulty. Affect pleasant  ECG: A paced 60 anterior Qs Personally reviewed  ASSESSMENT & PLAN:  1.  Chronic systolic HF due to NICM  - Echo (5/22): EF 30-35% - Cath (2/23): minimal CAD. Hs-troponin 204 -> 379 - Echo (2/23): EF 20-25% - cMRI (2/23): EF 11% RVEF 15% LGE pattern concerning for sarcoid. (D/w Dr. Bjorn Pippin)  - Echo 9/23 EF 45% - Cardiac PET 9/23 EF 51% no FDG uptake.  - Stable NYHA II Volume minimally eleavted  Take lasix as needed - Continue Entresto 49/51 bid - Increase spiro to 25  - Continue farxiga - No b-blocker with bradycardia - Labs   2. Cardiac sarcoidosis - based on cMRI  and clinical course - ACE level normal (52 U/L). CT chest. No extra-cardiac sarcoid. - Finished prednisone in 9/23 - Continue 20 mg Methotrexate once weekly.  - Cardiac PET 9/23 EF 51% no FDG uptake. No extra cardaic sarcoid either  - Will need repeat PET in 3-6 months off prednisone to make sure sarcoid not re-activating off prednisone  - Labs today - Will refer her to Dr. Dierdre Forth for f/u. We previously referred but she didn't f/u   3. PVCs/NSVT/PMVT - EP following.  - S/p ICD 02/23. No VT on device interrogation  - On amio 200 mg daily + mexiletine 200 bid for suppression.  - Decrease amio to 100 daily     4. Paroxysmal Atrial Flutter - S/p DCCV 2/16 -> back in AFL on 05/02/21.  - s/p TEE/DCCV 05/08/21-->NSR - in NSR - Continue Eliquis 5 mg bid. No bleeding issues. - -Decrease amio to 100 daily     5. Thrombocytopenia - Platelets 142>133>92>113>125 - Labs today   Arvilla Meres MD 11:44 AM

## 2022-02-23 NOTE — Progress Notes (Signed)
Referral form, OV note, demographics, and ins card all faxed to [facility] at [phone]

## 2022-02-23 NOTE — Addendum Note (Signed)
Encounter addended by: Noralee Space, RN on: 02/23/2022 2:17 PM  Actions taken: Clinical Note Signed

## 2022-02-23 NOTE — Addendum Note (Signed)
Encounter addended by: Noralee Space, RN on: 02/23/2022 12:05 PM  Actions taken: Visit diagnoses modified, Pharmacy for encounter modified, Order list changed, Diagnosis association updated, Clinical Note Signed

## 2022-02-23 NOTE — Patient Instructions (Signed)
Medication Changes:  Decrease Amiodarone to 100 mg (1/2 tab) Daily  Increase Spironolactone to 25 mg (1 tab) Daily  Lab Work:  Labs done today, your results will be available in MyChart, we will contact you for abnormal readings.  Testing/Procedures:  none  Referrals:  You have been referred to Dr Dierdre Forth at [facility], they will call you for an appointment  Special Instructions // Education:  Do the following things EVERYDAY: Weigh yourself in the morning before breakfast. Write it down and keep it in a log. Take your medicines as prescribed Eat low salt foods--Limit salt (sodium) to 2000 mg per day.  Stay as active as you can everyday Limit all fluids for the day to less than 2 liters   Follow-Up in: 6 months (June 2024), **PLEASE CALL OUR OFFICE IN [place] TO SCHEDULE THIS APPOINTMENT  At the [facility], you and your health needs are our priority. We have a designated team specialized in the treatment of Heart Failure. This Care Team includes your primary Heart Failure Specialized Cardiologist (physician), Advanced Practice Providers (APPs- Physician Assistants and Nurse Practitioners), and Pharmacist who all work together to provide you with the care you need, when you need it.   You may see any of the following providers on your designated Care Team at your next follow up:  Dr. Arvilla Meres Dr. Marca Ancona Dr. Marcos Eke, NP Robbie Lis, [place] [facility] [place], [place] Brynda Peon, NP Karle Plumber, PharmD   Please be sure to bring in all your medications bottles to every appointment.   Need to Contact us:  If you have any questions or concerns before your next appointment please send [place] a message through [place] or call our office at [phone].    TO LEAVE A MESSAGE FOR THE NURSE SELECT OPTION 2, PLEASE LEAVE A MESSAGE INCLUDING: YOUR NAME DATE OF BIRTH CALL BACK NUMBER REASON FOR CALL**this  is important as we prioritize the call backs  YOU WILL RECEIVE A CALL BACK THE SAME DAY AS LONG AS YOU CALL BEFORE 4:00 PM

## 2022-02-24 ENCOUNTER — Encounter: Payer: Self-pay | Admitting: Physician Assistant

## 2022-02-24 ENCOUNTER — Ambulatory Visit: Payer: Medicare Other | Attending: Critical Care Medicine | Admitting: Physician Assistant

## 2022-02-24 VITALS — BP 120/68 | HR 72 | Ht 64.0 in | Wt 172.0 lb

## 2022-02-24 DIAGNOSIS — E039 Hypothyroidism, unspecified: Secondary | ICD-10-CM | POA: Diagnosis not present

## 2022-02-24 DIAGNOSIS — Z23 Encounter for immunization: Secondary | ICD-10-CM

## 2022-02-24 NOTE — Progress Notes (Signed)
Patient ID: Sheri Morgan, adult   DOB: 06/05/1945, 76 y.o.   MRN: [id number]        Sheri Morgan, is a 76 y.o. adult  [id number]:[id number]  [id number]  DOB - October 17, 1945  Chief Complaint  Patient presents with   Hypertension    HTN f/u. No to flu vax.        Subjective:   Sheri Morgan is a 76 y.o. adult here today for check up but just saw cardiology yesterday and they did bloodwork and gave her RF.  She has never had a flu shot but thinks she wants to get one. She wants to discuss the pros and cons.   No new issues or concerns.  She is good on RF for now  From cardiology: Seen by cardiology yesterday and had labs.  From ASSESSMENT & PLAN:   1.  Chronic systolic HF due to NICM  - Echo (5/22): EF 30-35% - Cath (2/23): minimal CAD. Hs-troponin 204 -> 379 - Echo (2/23): EF 20-25% - cMRI (2/23): EF 11% RVEF 15% LGE pattern concerning for sarcoid. (D/w Dr. Bjorn Pippin)  - Echo 9/23 EF 45% - Cardiac PET 9/23 EF 51% no FDG uptake.  - Stable NYHA II Volume minimally eleavted  Take lasix as needed - Continue Entresto 49/51 bid - Increase spiro to 25  - Continue farxiga - No b-blocker with bradycardia - Labs   2. Cardiac sarcoidosis - based on cMRI and clinical course - ACE level normal (52 U/L). CT chest. No extra-cardiac sarcoid. - Finished prednisone in 9/23 - Continue 20 mg Methotrexate once weekly.  - Cardiac PET 9/23 EF 51% no FDG uptake. No extra cardaic sarcoid either  - Will need repeat PET in 3-6 months off prednisone to make sure sarcoid not re-activating off prednisone  - Labs today - Will refer her to Dr. Dierdre Forth for f/u. We previously referred but she didn't f/u     3. PVCs/NSVT/PMVT - EP following.  - S/p ICD 02/23. No VT on device interrogation  - On amio 200 mg daily + mexiletine 200 bid for suppression.  - Decrease amio to 100 daily     4. Paroxysmal Atrial Flutter - S/p DCCV 2/16 -> back in AFL on 05/02/21.  - s/p TEE/DCCV 05/08/21-->NSR - in NSR -  Continue Eliquis 5 mg bid. No bleeding issues. - -Decrease amio to 100 daily     5. Thrombocytopenia - Platelets 142>133>92>113>125 - Labs today   No problems updated.  ALLERGIES: Allergies  Allergen Reactions   Codeine Itching    PAST MEDICAL HISTORY: Past Medical History:  Diagnosis Date   Anxiety    Atypical atrial flutter (HCC)    Cardiac sarcoidosis    Chronic combined systolic and diastolic CHF (congestive heart failure) (HCC)    Fibromyalgia    Hypertension    Non-ischemic cardiomyopathy (HCC)    LHC 04/23/2021: 30% stenosis of proximal LAD   Paroxysmal atrial fibrillation (HCC)    Premature atrial contractions    Premature ventricular contraction    S/P ICD (internal cardiac defibrillator) procedure    Tachy-brady syndrome (HCC)    VT (ventricular tachycardia) (HCC)     MEDICATIONS AT HOME: Prior to Admission medications   Medication Sig Start Date End Date Taking? Authorizing Provider  amiodarone (PACERONE) 200 MG tablet Take 0.5 tablets (100 mg total) by mouth daily. 02/23/22  Yes Bensimhon, Bevelyn Buckles, MD  apixaban (ELIQUIS) 5 MG TABS tablet Take 1 tablet (5 mg total) by mouth 2 (  two) times daily. 10/26/21  Yes Bensimhon, Bevelyn Buckles, MD  calcium citrate (CALCITRATE - DOSED IN MG ELEMENTAL CALCIUM) 950 (200 Ca) MG tablet Take 1 tablet (200 mg of elemental calcium total) by mouth 2 (two) times daily. 05/11/21  Yes Andrey Farmer, PA-C  CVS D3 25 MCG (1000 UT) capsule TAKE 1 CAPSULE BY MOUTH EVERY DAY 12/31/21  Yes Laurey Morale, MD  dapagliflozin propanediol (FARXIGA) 10 MG TABS tablet Take 1 tablet (10 mg total) by mouth daily. 10/07/21  Yes Bensimhon, Bevelyn Buckles, MD  folic acid (FOLVITE) 1 MG tablet Take 1 tablet (1 mg total) by mouth daily. 05/20/21  Yes Bensimhon, Bevelyn Buckles, MD  furosemide (LASIX) 40 MG tablet Take 1 tablet as needed for weight gain > 3 lb in a day or 5 lb in a week, leg edema, shortness of breath 05/20/21  Yes Bensimhon, Bevelyn Buckles, MD   levothyroxine (SYNTHROID) 200 MCG tablet Take 1 tablet (200 mcg total) by mouth daily before breakfast. Take 1 tablet (200 mcg total) by mouth daily before breakfast. Further refills must be from PCP. 08/14/21  Yes Storm Frisk, MD  methotrexate (RHEUMATREX) 2.5 MG tablet TAKE 8 TABLETS BY MOUTH EVERY THURSDAY 07/27/21  Yes Laurey Morale, MD  mexiletine (MEXITIL) 200 MG capsule TAKE 1 CAPSULE BY MOUTH TWICE A DAY 01/05/22  Yes Bensimhon, Bevelyn Buckles, MD  pantoprazole (PROTONIX) 40 MG tablet Take 1 tablet (40 mg total) by mouth daily. 01/11/22  Yes Marinus Maw, MD  potassium chloride SA (KLOR-CON M) 20 MEQ tablet Take 1 tablet (20 mEq total) by mouth when taking furosemide 05/20/21  Yes Bensimhon, Bevelyn Buckles, MD  rosuvastatin (CRESTOR) 40 MG tablet TAKE 1 TABLET BY MOUTH EVERY DAY 08/17/21  Yes Bensimhon, Bevelyn Buckles, MD  sacubitril-valsartan (ENTRESTO) 49-51 MG Take 1 tablet by mouth 2 (two) times daily. 11/19/21  Yes Bensimhon, Bevelyn Buckles, MD  spironolactone (ALDACTONE) 25 MG tablet Take 1 tablet (25 mg total) by mouth daily. 02/23/22  Yes Bensimhon, Bevelyn Buckles, MD    ROS: Neg HEENT Neg resp Neg cardiac Neg GI Neg GU Neg MS Neg psych Neg neuro  Objective:   Vitals:   02/24/22 1058  BP: 120/68  Pulse: 72  SpO2: 100%  Weight: 172 lb (78 kg)  Height: 5\' 4"  (1.626 m)   Exam General appearance : Awake, alert, not in any distress. Speech Clear. Not toxic looking HEENT: Atraumatic and Normocephalic Neck: Supple, no JVD. No cervical lymphadenopathy.  Chest: Good air entry bilaterally, CTAB.  No rales/rhonchi/wheezing CVS: S1 S2 regular, no murmurs.  Extremities: B/L Lower Ext shows no edema, both legs are warm to touch Neurology: Awake alert, and oriented X 3, CN II-XII intact, Non focal Skin: No Rash  Data Review Lab Results  Component Value Date   HGBA1C 6.0 (H) 04/23/2021   HGBA1C 6.4 02/05/2020    Assessment & Plan   1. Needs flu shot I recommended she get a flu shot after  counseling on pros and cons.  A handout of Flu shot information given to read and then she decided she did NOT want to get her flu shot.  2. Acquired hypothyroidism Labs done yesterday-continue medications    Return in about 6 months (around 08/26/2022) for PCP for chronic conditions.  The patient was given clear instructions to go to ER or return to medical center if symptoms don't improve, worsen or new problems develop. The patient verbalized understanding. The patient was told to call to  get lab results if they haven't heard anything in the next week.      [place] Co, PA-C [facility] [place], [place] [phone]   02/24/2022, 11:21 AM

## 2022-02-25 LAB — T3, FREE: T3, Free: 1.6 pg/mL — ABNORMAL LOW (ref 2.0–4.4)

## 2022-03-06 ENCOUNTER — Other Ambulatory Visit (HOSPITAL_COMMUNITY): Payer: Self-pay | Admitting: Physician Assistant

## 2022-03-16 NOTE — Progress Notes (Signed)
Remote ICD transmission.   

## 2022-03-17 ENCOUNTER — Other Ambulatory Visit (HOSPITAL_COMMUNITY): Payer: Self-pay | Admitting: Cardiology

## 2022-03-17 ENCOUNTER — Other Ambulatory Visit (HOSPITAL_COMMUNITY): Payer: Self-pay | Admitting: Internal Medicine

## 2022-04-09 ENCOUNTER — Other Ambulatory Visit: Payer: Self-pay | Admitting: Internal Medicine

## 2022-04-09 ENCOUNTER — Other Ambulatory Visit (HOSPITAL_COMMUNITY): Payer: Self-pay | Admitting: Cardiology

## 2022-04-22 ENCOUNTER — Ambulatory Visit: Payer: Self-pay | Admitting: *Deleted

## 2022-04-22 ENCOUNTER — Telehealth: Payer: Self-pay | Admitting: Emergency Medicine

## 2022-04-22 NOTE — Telephone Encounter (Signed)
  Chief Complaint: Abdominal Pain Symptoms: Abdominal pain "Entire stomach" intermittent, occurs daily x 4-6 weeks. Right red blood in stool at times. Vomiting at times x 4-6 weeks, worsened,"Last week vomited 1-2 times a day. Not today." Also reports "Clot from nose sometimes, only when I blow my nose." Frequency: 4-6 weeks, worsening Pertinent Negatives: Patient denies  Disposition: [] ED /[] Urgent Care (no appt availability in office) / [x] Appointment(In office/virtual)/ []  [facility] [facility]/ [] Home Care/ [] Refused Recommended Disposition /[] [facility]/ []  Follow-up with PCP Additional Notes: Pt also states she fell Tuesday, no injuries. Pt is on Eloquis. Pt initially calling for colonoscopy referral. Secured first available appt, Feb 13th. Placed on waitlist. CAn pt be seen earlier? Please advise Reason for Disposition  Age > 60 years  Answer Assessment - Initial Assessment Questions 1. LOCATION: "Where does it hurt?"      Entire stomach  2. RADIATION: "Does the pain shoot anywhere else?" (e.g., chest, back)     *No Answer* 3. ONSET: "When did the pain begin?" (e.g., minutes, hours or days ago)      4-6 weeks ago, better, now worse 4. SUDDEN: "Gradual or sudden onset?"      5. PATTERN "Does the pain come and go, or is it constant?"    - If it comes and goes: "How long does it last?" "Do you have pain now?"     (Note: Comes and goes means the pain is intermittent. It goes away completely between bouts.)    - If constant: "Is it getting better, staying the same, or getting worse?"      (Note: Constant means the pain never goes away completely; most serious pain is constant and gets worse.)      Comes and goes 6. SEVERITY: "How bad is the pain?"  (e.g., Scale 1-10; mild, moderate, or severe)    - MILD (1-3): Doesn't interfere with normal activities, abdomen soft and not tender to touch.     - MODERATE (4-7): Interferes with normal activities or awakens from sleep,  abdomen tender to touch.     - SEVERE (8-10): Excruciating pain, doubled over, unable to do any normal activities.       One bout few nights ago, 10/10, rest 8/10 7. RECURRENT SYMPTOM: "Have you ever had this type of stomach pain before?" If Yes, ask: "When was the last time?" and "What happened that time?"      Had for a while 8. CAUSE: "What do you think is causing the stomach pain?"     Unsure 9. RELIEVING/AGGRAVATING FACTORS: "What makes it better or worse?" (e.g., antacids, bending or twisting motion, bowel movement)     Lie down 10. OTHER SYMPTOMS: "Do you have any other symptoms?" (e.g., back pain, diarrhea, fever, urination pain, vomiting)       4-6 weeks, off and on, last week vomited daily, not today. About 2 times a day. Blood in stools, bright red. Occasional clots from nose when I blow my nose. 2 months  Protocols used: Abdominal Pain - Female-A-AH

## 2022-04-22 NOTE — Telephone Encounter (Signed)
Copied from [place] [phone]. Topic: Referral - Request for Referral >> Apr 22, 2022  3:16 PM Sabas Sous wrote: Has patient seen PCP for this complaint? Yes.   *If NO, is insurance requiring patient see PCP for this issue before PCP can refer them? Referral for which specialty: Gertie Fey Preferred provider/office: Highest recommended  Reason for referral: "Having stomach problems, weakness, vomiting almost daily, acute constipation, pain in stomach" transferred to NT

## 2022-04-23 NOTE — Telephone Encounter (Signed)
Call pt and tell her we will sort this out so I can make the best informed referral when I see her Tuesday

## 2022-04-25 NOTE — Progress Notes (Deleted)
Established Patient Office Visit  Subjective   Patient ID: Sheri Morgan, adult    DOB: 16-Nov-1945  Age: 77 y.o. MRN: [id number]  No chief complaint on file.   09/2021 Sheri Morgan is a 77 year old female presents for follow up and medication refills. History of hypertension, myocardial infarction (2023), heart failure, cardiac sarcoidosis, hypothyroidism, prediabetes, and gastroesophageal reflux disease.  Blood pressure today 133/85. She complains of intermittent heart burn,constipation, and blurred vision in right eye. She is taking Prilosec and uses a stool softener as needed for relief. Ophthalmology is following her cataracts and is awaiting a second procedure. She would like for [place] to look at her toe nail today. No further acute concerns.  Patient was hospitalized in February for heart failure and discovered to have sarcoidosis. She is recovering well and states "she feels like nothing even happened". Her condition is being followed by cardiology. She is on a prednisone and methotrexate taper. Adherent to her medications. Denies alcohol, tobacco, illicit drug use.  Patient due for DEXA scan  Note last Cardiology HF clinic visit 08/03/2021 with Ninfa Meeker NP: CHF clinic 5/22 per [place] NP ASSESSMENT & PLAN: 1.  Chronic systolic HF due to NICM  - Echo (5/22): EF 30-35% - Cath (2/23): minimal CAD. Hs-troponin 204 -> 379 - Echo (2/23): EF 20-25% - cMRI (2/23): EF 11% RVEF 15% LGE pattern concerning for sarcoid. (D/w Dr. Gardiner Rhyme)  - NYHA II. Volume status stable. Continue lasix as needed.  - Continue losartan  - Continue spiro  -Continue farxiga - Holding b-blocker with recent bradycardia/low output for now.  - Check renal function.    2. Cardiac sarcoidosis - based on cMRI and clinical course - ACE level normal (52 U/L). CT chest. No extra-cardiac sarcoid. - Set up PET Scan at [facility] once prednisone is weaned down <10 mg.  - She is taking 25 mg prednisone instead  of 15 mg daily.  For this reason we will reset the prednisone taper . Today she will cut back prednisone to 20 mg daily.  Immunosuppressive Therapy for Cardiac Sarcoid  Prednisone   Weeks 1-4  04/29/21 through 05/26/21  30 mg (three 10 mg tablets) daily  Weeks 5-8  05/27/21 through 06/23/21  25 mg (two and a half 10 mg tablets) daily  Weeks 9-12  08/04/2021 - 08/31/2021  20 mg (two 10 mg tablets) daily  Weeks 13-16    15 mg (one and a half 10 mg tablets) daily  Weeks 17-18    10 mg (two 5 mg tablets) daily  Weeks 19-20    7.5 mg (one and a half 5 mg tablets) daily  Weeks 21-21    5 mg (one 5 mg tablet) daily  Weeks 23-24    2.5 mg (half of a 5 mg tablet) daily  Week 25    Stop prednisone     Methotrexate   Weeks 1-2  04/30/21 & 05/07/21  10 mg (four 2.5 mg tablets) once weekly  *Patient missed originally scheduled increase on 05/14/21. Restarted titration as below Weeks 3-4  05/20/21 & 05/27/21  12.5 mg (five 2.5 mg tablets) once weekly  Weeks 5-6  06/03/21 & 06/10/21  15 mg (six 2.5 mg tablets) once weekly  Weeks 7-8  06/17/21 & 06/24/21  17.5 mg (seven 2.5 mg tablets) once weekly  Weeks 9+  07/01/21 & thereafter 20 mg (eight 2.5 mg tablets) once weekly      Additional Medications  Bactrim DS : 1 tablet Mon-Wed-  Fri while taking >15 mg of prednisone daily Folic Acid : 1 mg daily  Calcium Citrate -- switch to twice a day   Vitamin D: 1000 IU total daily  On protonix 40 mg daily while taking any dose of prednisone.      -Check CMET and CBC with diff at every visit.    3. PVCs/NSVT/PMVT - EP following.  - did not tolerate amiodarone previously due to worsening bradycardia - S/p ICD 02/23. - Continue amio 200 mg daily + mexiletine 200 bid for suppression.    4. Paroxysmal Atrial Flutter - S/p DCCV 2/16 -> back in [facility] on 05/02/21.  - s/p TEE/DCCV 05/08/21-->NSR - Continue Eliquis 5 mg bid. No bleeding issues. - Continue amio 200 mg daily.   5. H/o AKI - GDMT as above. - Labs today.   6. Thrombocytopenia -  Platelets 142>133>92 - Check CBC    04/27/22  Cards visit 02/2022   Chief Complaint Patient presents with  Hypertension  Subjective:   Sheri Morgan is a 77 y.o. adult here today for check up but just saw cardiology yesterday and they did bloodwork and gave her RF.  She has never had a flu shot but thinks she wants to get one. She wants to discuss the pros and cons.   No new issues or concerns.  She is good on RF for now   From cardiology: Seen by cardiology yesterday and had labs.  From ASSESSMENT & PLAN:   1.  Chronic systolic HF due to NICM  - Echo (5/22): EF 30-35% - Cath (2/23): minimal CAD. Hs-troponin 204 -> 379 - Echo (2/23): EF 20-25% - cMRI (2/23): EF 11% RVEF 15% LGE pattern concerning for sarcoid. (D/w Dr. Gardiner Rhyme)  - Echo 9/23 EF 45% - Cardiac PET 9/23 EF 51% no FDG uptake.  - Stable NYHA II Volume minimally eleavted  Take lasix as needed - Continue Entresto 49/51 bid - Increase spiro to 25  - Continue farxiga - No b-blocker with bradycardia - Labs   2. Cardiac sarcoidosis - based on cMRI and clinical course - ACE level normal (52 U/L). CT chest. No extra-cardiac sarcoid. - Finished prednisone in 9/23 - Continue 20 mg Methotrexate once weekly.  - Cardiac PET 9/23 EF 51% no FDG uptake. No extra cardaic sarcoid either  - Will need repeat PET in 3-6 months off prednisone to make sure sarcoid not re-activating off prednisone  - Labs today - Will refer her to Dr. Amil Amen for f/u. We previously referred but she didn't f/u     3. PVCs/NSVT/PMVT - EP following.  - S/p ICD 02/23. No VT on device interrogation  - On amio 200 mg daily + mexiletine 200 bid for suppression.  - Decrease amio to 100 daily     4. Paroxysmal Atrial Flutter - S/p DCCV 2/16 -> back in [facility] on 05/02/21.  - s/p TEE/DCCV 05/08/21-->NSR - in NSR - Continue Eliquis 5 mg bid. No bleeding issues. - -Decrease amio to 100 daily     5. Thrombocytopenia - Platelets 142>133>92>113>125 - Labs  today  Hcv bone dexa  abd pain      Patient Active Problem List   Diagnosis Date Noted   Sinus node dysfunction ([place]) 09/22/2021   Cataract 09/16/2021   Ingrown nail of great toe of right foot 05/27/2021   Chronic combined systolic and diastolic heart failure ([place]) 05/09/2021   Non-Obstructive CAD 05/09/2021   Paroxysmal atrial fibrillation 05/09/2021   New onset atrial flutter ([place])  05/09/2021   NSVT (nonsustained ventricular tachycardia) ([place]) 05/09/2021   PMVT 05/09/2021   Cardiac sarcoidosis 05/09/2021   ICD (implantable cardioverter-defibrillator) in place 05/09/2021   NICM (nonischemic cardiomyopathy) ([place]) 04/28/2021   Pulmonary nodule 04/23/2021   NSTEMI (non-ST elevated myocardial infarction) ([place]) 04/23/2021   Severe obesity (BMI 35.0-39.9) with comorbidity ([place]) 12/26/2020   Neuroforaminal stenosis of cervical spine 04/04/2020   Stenosis of cervical spine with myelopathy ([place]) 04/04/2020   Stage 3a chronic kidney disease ([place]) 02/06/2020   Numbness in both hands 02/05/2020   Longstanding persistent atrial fibrillation ([place]) 02/05/2020   Acquired hypothyroidism 02/05/2020   Prediabetes 02/05/2020   Primary hypertension 02/05/2020   Hyperlipidemia LDL goal <130 02/05/2020   PVC (premature ventricular contraction) 08/04/2016   GAD (generalized anxiety disorder) 08/10/2013   Osteopenia 10/29/2010   Myalgia and myositis, unspecified 04/21/2010   Esophageal reflux 11/01/2008   Allergic rhinitis 12/23/2006   Past Medical History:  Diagnosis Date   Anxiety    Atypical atrial flutter [facility])    Cardiac sarcoidosis    Chronic combined systolic and diastolic CHF (congestive heart failure) ([place])    Fibromyalgia    Hypertension    Non-ischemic cardiomyopathy ([place])    LHC 04/23/2021: 30% stenosis of proximal LAD   Paroxysmal atrial fibrillation ([facility])    Premature atrial contractions    Premature ventricular contraction    S/P ICD (internal cardiac defibrillator)  procedure    Tachy-brady syndrome ([place])    VT (ventricular tachycardia) ([place])    Past Surgical History:  Procedure Laterality Date   ABDOMINAL HYSTERECTOMY     CARDIOVERSION N/A 04/30/2021   Procedure: CARDIOVERSION;  Surgeon: Jolaine Artist, MD;  Location: [place];  Service: Cardiovascular;  Laterality: N/A;   CARDIOVERSION N/A 05/08/2021   Procedure: CARDIOVERSION;  Surgeon: Jolaine Artist, MD;  Location: [place];  Service: Cardiovascular;  Laterality: N/A;   ICD IMPLANT N/A 05/04/2021   Procedure: ICD IMPLANT;  Surgeon: Evans Lance, MD;  Location: [place] [facility];  Service: Cardiovascular;  Laterality: N/A;   LEFT HEART CATH AND CORONARY ANGIOGRAPHY N/A 04/23/2021   Procedure: LEFT HEART CATH AND CORONARY ANGIOGRAPHY;  Surgeon: Burnell Blanks, MD;  Location: [place] [facility];  Service: Cardiovascular;  Laterality: N/A;   LUMBAR LAMINECTOMY     TEE WITHOUT CARDIOVERSION N/A 05/08/2021   Procedure: TRANSESOPHAGEAL ECHOCARDIOGRAM (TEE);  Surgeon: Jolaine Artist, MD;  Location: [facility] ENDOSCOPY;  Service: Cardiovascular;  Laterality: N/A;   THYROIDECTOMY     Social History   Tobacco Use   Smoking status: Never   Smokeless tobacco: Never  Vaping Use   Vaping Use: Never used  Substance Use Topics   Alcohol use: No   Drug use: No   Social History   Socioeconomic History   Marital status: Divorced    Spouse name: Not on file   Number of children: Not on file   Years of education: Not on file   Highest education level: Not on file  Occupational History   Not on file  Tobacco Use   Smoking status: Never   Smokeless tobacco: Never  Vaping Use   Vaping Use: Never used  Substance and Sexual Activity   Alcohol use: No   Drug use: No   Sexual activity: Never  Other Topics Concern   Not on file  Social History Narrative   Retired from [employer]   Social Determinants of Health   Financial Resource Strain: Low Risk  (11/13/2020)   Overall  Financial  Resource Strain (CARDIA)    Difficulty of Paying Living Expenses: Not hard at all  Food Insecurity: Food Insecurity Present (11/20/2021)   Hunger Vital Sign    Worried About Running Out of Food in the Last Year: Sometimes true    Ran Out of Food in the Last Year: Sometimes true  Transportation Needs: No Transportation Needs (11/19/2021)   PRAPARE - Hydrologist (Medical): No    Lack of Transportation (Non-Medical): No  Physical Activity: Insufficiently Active (11/13/2020)   Exercise Vital Sign    Days of Exercise per Week: 1 day    Minutes of Exercise per Session: 30 min  Stress: No Stress Concern Present (11/13/2020)   [place]    Feeling of Stress : Not at all  Social Connections: Moderately Integrated (11/13/2020)   Social Connection and Isolation Panel [NHANES]    Frequency of Communication with Friends and Family: More than three times a week    Frequency of Social Gatherings with Friends and Family: Not on file    Attends Religious Services: More than 4 times per year    Active Member of [employer] or Organizations: Yes    Attends Archivist Meetings: Never    Marital Status: Divorced  Human resources officer Violence: Not At Risk (11/13/2020)   Humiliation, Afraid, Rape, and Kick questionnaire    Fear of Current or Ex-Partner: No    Emotionally Abused: No    Physically Abused: No    Sexually Abused: No   Family Status  Relation Name Status   Mother  Deceased   Father  Deceased   Family History  Problem Relation Age of Onset   Diabetes Mother    Alcoholism Father    Allergies  Allergen Reactions   Codeine Itching    Review of Systems  Constitutional: Negative.   HENT: Negative.    Eyes:  Positive for blurred vision.  Cardiovascular: Negative.   Gastrointestinal:  Positive for constipation and heartburn.  Genitourinary: Negative.   Musculoskeletal: Negative.   Skin: Negative.    Neurological: Negative.   Endo/Heme/Allergies: Negative.   Psychiatric/Behavioral: Negative.        Objective:     There were no vitals taken for this visit. BP Readings from Last 3 Encounters:  02/24/22 120/68  02/23/22 118/70  01/11/22 98/68   Wt Readings from Last 3 Encounters:  02/24/22 172 lb (78 kg)  02/23/22 172 lb 9.6 oz (78.3 kg)  01/11/22 177 lb (80.3 kg)      Physical Exam Constitutional:      Appearance: Normal appearance. DANINE HOR is obese.  HENT:     Right Ear: Tympanic membrane, ear canal and external ear normal.     Left Ear: Tympanic membrane, ear canal and external ear normal.     Nose: Nose normal.     Mouth/Throat:     Mouth: Mucous membranes are moist.     Dentition: Dental caries present.     Pharynx: Oropharynx is clear.  Eyes:     General: Lids are normal.     Comments: Right cataract  Cardiovascular:     Rate and Rhythm: Normal rate and regular rhythm.     Pulses: Normal pulses.     Heart sounds: Normal heart sounds.  Pulmonary:     Effort: Pulmonary effort is normal.     Breath sounds: Normal breath sounds.  Abdominal:     General: Abdomen  is flat. Bowel sounds are normal.     Palpations: Abdomen is soft.     Tenderness: There is abdominal tenderness in the epigastric area.  Musculoskeletal:     Right lower leg: 1+ Pitting Edema present.     Left lower leg: 1+ Pitting Edema present.  Feet:     Right foot:     Toenail Condition: Fungal disease present.    Left foot:     Toenail Condition: Fungal disease present. Skin:    General: Skin is warm and dry.  Neurological:     Mental Status: Sheri Morgan is alert.  Psychiatric:        Mood and Affect: Mood normal.        Behavior: Behavior normal.      No results found for any visits on 04/27/22.  Last CBC Lab Results  Component Value Date   WBC 3.1 (L) 02/23/2022   HGB 12.1 02/23/2022   HCT 36.0 02/23/2022   MCV 96.5 02/23/2022   MCH 32.4 02/23/2022   RDW 17.9  (H) 02/23/2022   PLT 105 (L) 78/58/8502   Last metabolic panel Lab Results  Component Value Date   GLUCOSE 88 02/23/2022   NA 139 02/23/2022   K 4.2 02/23/2022   CL 106 02/23/2022   CO2 24 02/23/2022   BUN 18 02/23/2022   CREATININE 1.78 (H) 02/23/2022   GFRNONAA 29 (L) 02/23/2022   CALCIUM 9.6 02/23/2022   PROT 6.5 02/23/2022   ALBUMIN 3.7 02/23/2022   BILITOT 0.6 02/23/2022   ALKPHOS 114 02/23/2022   AST 27 02/23/2022   ALT 20 02/23/2022   ANIONGAP 9 02/23/2022   Last lipids Lab Results  Component Value Date   CHOL 253 (H) 04/23/2021   HDL 101 04/23/2021   LDLCALC 141 (H) 04/23/2021   TRIG 53 04/23/2021   CHOLHDL 2.5 04/23/2021   Last hemoglobin A1c Lab Results  Component Value Date   HGBA1C 6.0 (H) 04/23/2021   Last thyroid functions Lab Results  Component Value Date   TSH 0.022 (L) 02/23/2022   T4TOTAL 9.2 07/13/2021   Last vitamin D No results found for: "25OHVITD2", "25OHVITD3", "VD25OH" Last vitamin B12 and Folate Lab Results  Component Value Date   VITAMINB12 314 02/05/2020   FOLATE 21.9 02/05/2020    The ASCVD Risk score (Arnett DK, et al., 2019) failed to calculate for the following reasons:   The patient has a prior MI or stroke diagnosis    Assessment & Plan:   Problem List Items Addressed This Visit   None  No follow-ups on file.    Asencion Noble, MD

## 2022-04-27 ENCOUNTER — Ambulatory Visit: Payer: Medicare Other | Admitting: Critical Care Medicine

## 2022-04-30 NOTE — Telephone Encounter (Signed)
Patient cancelled appointment with [facility] and seen mcclung to follow up

## 2022-05-03 ENCOUNTER — Ambulatory Visit (INDEPENDENT_AMBULATORY_CARE_PROVIDER_SITE_OTHER): Payer: Medicare Other

## 2022-05-03 DIAGNOSIS — I428 Other cardiomyopathies: Secondary | ICD-10-CM

## 2022-05-05 DIAGNOSIS — I5020 Unspecified systolic (congestive) heart failure: Secondary | ICD-10-CM | POA: Insufficient documentation

## 2022-05-06 LAB — CUP PACEART REMOTE DEVICE CHECK
Battery Remaining Longevity: 74 mo
Battery Remaining Percentage: 87 %
Battery Voltage: 3.04 V
Brady Statistic AP VP Percent: 14 %
Brady Statistic AP VS Percent: 60 %
Brady Statistic AS VP Percent: 1.1 %
Brady Statistic AS VS Percent: 25 %
Brady Statistic RA Percent Paced: 73 %
Brady Statistic RV Percent Paced: 15 %
Date Time Interrogation Session: 20240221183410
HighPow Impedance: 64 Ohm
HighPow Impedance: 64 Ohm
Implantable Lead Connection Status: 753985
Implantable Lead Connection Status: 753985
Implantable Lead Implant Date: 20230220
Implantable Lead Implant Date: 20230220
Implantable Lead Location: 753859
Implantable Lead Location: 753860
Implantable Lead Model: 7122
Implantable Pulse Generator Implant Date: 20230220
Lead Channel Impedance Value: 400 Ohm
Lead Channel Impedance Value: 450 Ohm
Lead Channel Pacing Threshold Amplitude: 1 V
Lead Channel Pacing Threshold Amplitude: 1.25 V
Lead Channel Pacing Threshold Pulse Width: 0.5 ms
Lead Channel Pacing Threshold Pulse Width: 0.5 ms
Lead Channel Sensing Intrinsic Amplitude: 1.9 mV
Lead Channel Sensing Intrinsic Amplitude: 8.7 mV
Lead Channel Setting Pacing Amplitude: 2.5 V
Lead Channel Setting Pacing Amplitude: 2.5 V
Lead Channel Setting Pacing Pulse Width: 0.5 ms
Lead Channel Setting Sensing Sensitivity: 0.5 mV
Pulse Gen Serial Number: 8937527
Zone Setting Status: 755011

## 2022-05-26 ENCOUNTER — Ambulatory Visit: Payer: Medicare Other | Attending: Physician Assistant | Admitting: Physician Assistant

## 2022-05-26 ENCOUNTER — Encounter: Payer: Self-pay | Admitting: Physician Assistant

## 2022-05-26 ENCOUNTER — Telehealth: Payer: Self-pay | Admitting: Gastroenterology

## 2022-05-26 ENCOUNTER — Other Ambulatory Visit: Payer: Self-pay | Admitting: Physician Assistant

## 2022-05-26 VITALS — BP 135/80 | HR 58 | Wt 157.6 lb

## 2022-05-26 DIAGNOSIS — Z09 Encounter for follow-up examination after completed treatment for conditions other than malignant neoplasm: Secondary | ICD-10-CM | POA: Diagnosis not present

## 2022-05-26 DIAGNOSIS — N2889 Other specified disorders of kidney and ureter: Secondary | ICD-10-CM

## 2022-05-26 DIAGNOSIS — K5790 Diverticulosis of intestine, part unspecified, without perforation or abscess without bleeding: Secondary | ICD-10-CM | POA: Diagnosis not present

## 2022-05-26 DIAGNOSIS — R110 Nausea: Secondary | ICD-10-CM

## 2022-05-26 DIAGNOSIS — R634 Abnormal weight loss: Secondary | ICD-10-CM

## 2022-05-26 DIAGNOSIS — E039 Hypothyroidism, unspecified: Secondary | ICD-10-CM

## 2022-05-26 DIAGNOSIS — K5900 Constipation, unspecified: Secondary | ICD-10-CM

## 2022-05-26 DIAGNOSIS — K921 Melena: Secondary | ICD-10-CM

## 2022-05-26 MED ORDER — POLYETHYLENE GLYCOL 3350 17 GM/SCOOP PO POWD: 17.0000 g | Freq: Two times a day (BID) | ORAL | 1 refills | Status: AC | PRN

## 2022-05-26 MED ORDER — ONDANSETRON HCL 4 MG PO TABS: 4.0000 mg | ORAL_TABLET | Freq: Three times a day (TID) | ORAL | 0 refills | Status: DC | PRN

## 2022-05-26 NOTE — Patient Instructions (Signed)
Drink 80 ounces water daily °

## 2022-05-26 NOTE — Progress Notes (Addendum)
Patient ID: KNYLA DWAN, adult   DOB: 1946-02-11, 77 y.o.   MRN: [id number]     Sheri Morgan, is a 77 y.o. adult  [id number]  [id number]  DOB - 1945-09-13  Chief Complaint  Patient presents with   Hospitalization Follow-up       Subjective:   Sheri Morgan is a 77 y.o. adult here today for a follow up visit After admission 04/27/2022-05/05/2022 for suspected diverticulitis and hypovolemic shock at [facility].  BP meds (entresto and losartan) have not been resumed.  She is still taking midodrine.  BP at home are 120-130/70-80.  She has still been having some nausea and about 1 episode of vomiting per week or less.  She has had 2 episodes of BRB in the toilet.  She has been constipated.  She is just overall not feeling "back to herself."  Documented weight loss from 172 to 157 in Epic.  She says she gets full more easily and appetite is not as good as it used to be.  No fever.  No acute abdominal pain.  She has not had to take lasix or potassium in a while.  She denies CP/SOB/edema     From [facility] Notes: Hospital Course: For full details, please see H&P, progress notes, consult notes and ancillary notes. Briefly, 77 y.o. female with HFrEF (EF 30-35%) on lasix, cardiac sarcoidosis, recurrent PVCs/NSVT/PMVT s/p ICD, paroxsysmal atrial flutter presenting to the ED for evaluation of abdominal pain, persistent nausea vomiting, admitted to the ICU for vasopressor support. Hospital course will be addressed in a problem based format as below.  #Shock of uncertain etiology, likely hypovolemic versus septic #Suspected diverticulitis #History of HFrEF, cardiac sarcoidosis, PMVT s/p ICD Patient initially presented after several weeks of persistent nausea/vomiting with meals and persistent abdominal pain. She presented with systolic blood pressures in the 60s/70s, and received nearly 4 L of IV fluid resuscitation in the ED, but remained hypotensive and thus was admitted to the ICU and  started on Levophed. At one point, she required up to 26 mcg/minute of Levophed. She was started empirically with Zosyn provide days for suspected intra-abdominal source of infection. Her infectious workup was mostly unremarkable, with negative blood cultures, negative urinalysis, negative respiratory symptoms/diarrhea. She had a CT abdomen/pelvis with contrast performed which showed nonspecific enteritis involving descending colon, and potential inflammatory changes along the ascending colon which could represent an early/developing diverticulitis. She also had a repeat TTE performed which showed an EF of 40-45%, elevated from her prior known EF of 30-35%. To improve her cardiac output while in shock, EP adjusted her pacemaker and increased heart rate from 60 bpm to 80 bpm. The etiology of her shock was felt to be likely hypovolemic versus septic, and unlikely to be cardiogenic or hemorrhagic event absence of decreasing hemoglobin and warm, flushed extremities.  Prior to transfer from the MICU to the ACE unit, patient had been off IV vasopressors for 24 hours and started on 7.5 mg 3 times daily midodrine, with losartan/Entresto held. Upon transfer to our unit, patient's systolic blood pressure remained around 100/110. EP was reengaged, and EP we adjusted her pacemaker back to 60 bpm on the day of discharge.  #AKI on CKD Patient has a baseline creatinine of 1.4-1.6, with an admission creatinine of 2.33. She received up to 4 L of IV fluids, which initially improved her creatinine. On the day of discharge, her creatinine had returned to around her baseline at 1.62.  On day of discharge, patient is  clinically stable with no new examination findings or acute symptoms compared to prior. The patient was seen by the attending physician on the date of discharge and deemed stable and acceptable for discharge. The patient's chronic medical conditions were treated accordingly per the patient's home medication regimen.  The patient's medication reconciliation, follow-up appointments, discharge orders, instructions, and significant lab and diagnostic studies are as noted.  Medication changes: 1. Midodrine 7.5 mg 3 times daily prescribed as part of bridging for hypotension, continue at discharge 2. Losartan and Entresto both held on discharge given SBP 100-110 on discharge. 3. Levothyroxine dose reduced from 200 mcg daily to 175 mcg daily given TSH < 0.05 and elevated free T4 4. Continue methotrexate 20 mg weekly  Discharge Follow-up Action Items: 1. Follow-up with PCP in 1-2 weeks (patient has PCP in [place] but is planning on moving to [place]) 2. Reassess blood pressure, unhold Entresto as indicated. Consider removing losartan from home medication list as patient is already on Entresto (sacubitril/valsartan) 3. Recheck TSH and free T4, adjust synthroid dose as necessary 4. Recheck metabolic panel to evaluate for creatinine 5. Gold card to cardiology (patient would like to establish in [place] as she is moving away from [place]). Pacemaker settings were returned to baseline on day of discharge. 6. On CT abdomen/pelvis, a renal lesion (right upper pole lobulated, intermediate density) was noted (stable since 2016) concerning for slow-growing neoplasm. Consider repeating CT abdomen/pelvis in 6 months-1 year (or sooner based on clinical judgement) for monitoring.    CHANGE how you take these medications  Entresto 49-51 mg per tablet What changed: additional instructions Dose: 1 tablet Instructions: Take 1 tablet by mouth 2 times daily. HOLD until follow-up with PCP or cardiologist . Generic drug: sacubitriL-valsartan  levothyroxine 50 MCG tablet Commonly known as: SYNTHROID What changed:   medication strength  See the new instructions.  Another medication with the same name was removed. Continue taking this medication, and follow the directions you see here. Dose: 175 mcg Take 3.5  tablets (175 mcg total) by mouth Daily at 6AM. Instructions: (Take 3.5 tablets (175 mcg total) by mouth Daily at 0600.)  losartan 25 MG tablet Commonly known as: COZAAR What changed:   how much to take  how to take this  when to take this  additional instructions Instructions: HOLD until follow-up with PCP. Previous prescription: Take 25 mg (1 talet) daily   CONTINUE taking these medications  amiodarone 200 MG tablet Commonly known as: PACERONE Dose: 200 mg Instructions: Take 1 tablet (200 mg total) by mouth daily.  calcium citrate 200 mg (950 mg) tablet Commonly known as: CALCITRATE Dose: 1 tablet Instructions: Take 1 tablet (950 mg total) by mouth 2 times daily.  cholecalciferol (vitamin D3) 25 mcg (1,000 unit) capsule Dose: 1 capsule Instructions: Take 1 capsule (1,000 Units total) by mouth daily.  Eliquis 5 mg tablet *ANTICOAGULANT* Dose: 1 tablet Instructions: Take 1 tablet (5 mg total) by mouth 2 times daily. Generic drug: apixaban  Farxiga 10 mg tablet Dose: 10 mg Instructions: Take 1 tablet (10 mg total) by mouth daily. Generic drug: dapagliflozin propanediol  folic acid 1 MG tablet Commonly known as: FOLVITE Dose: 1 tablet Instructions: Take 1 tablet (1 mg total) by mouth daily.  furosemide 40 MG tablet Commonly known as: LASIX Instructions: Take 1 tablet as needed for weight gain > 3 lb in a day or 5 lb in a week, leg edema, shortness of breath  ibuprofen 100 MG tablet Commonly known as: ADVIL,MOTRIN  Dose: 100 mg Instructions: Take 1 tablet (100 mg total) by mouth.  methotrexate 2.5 MG tablet Instructions: Take by mouth once a week. Pt states she takes 8 tablets every saturday  mexiletine 200 MG capsule Commonly known as: MEXITIL Dose: 200 mg Instructions: Take 1 capsule (200 mg total) by mouth 2 times daily.  pantoprazole 40 MG tablet Commonly known as: PROTONIX Dose: 1 tablet Instructions: Take 1 tablet (40 mg total) by mouth  daily.  potassium chloride ER 20 MEQ extended release tablet Commonly known as: KLOR-CON-M, K-DUR Instructions: Take 1 tablet (20 mEq total) by mouth when taking furosemide  rosuvastatin 40 MG tablet Commonly known as: CRESTOR Dose: 40 mg Instructions: Take 1 tablet (40 mg total) by mouth daily.  spironolactone 25 MG tablet Commonly known as: ALDACTONE Dose: 12.5 mg Instructions: Take 0.5 tablets (12.5 mg total) by mouth daily.   STOP taking these medications  Ativan 0.5 MG tablet Generic drug: LORazepam  CALCIUM & MAGNESIUM CARBONATES ORAL  hydroCHLOROthiazide 25 MG tablet Commonly known as: HYDRODIURIL  omeprazole 20 MG DR capsule Commonly known as: PriLOSEC     No problems updated.  ALLERGIES: Allergies  Allergen Reactions   Codeine Itching    PAST MEDICAL HISTORY: Past Medical History:  Diagnosis Date   Anxiety    Atypical atrial flutter (HCC)    Cardiac sarcoidosis    Chronic combined systolic and diastolic CHF (congestive heart failure) (HCC)    Fibromyalgia    Hypertension    Non-ischemic cardiomyopathy ([place])    LHC 04/23/2021: 30% stenosis of proximal LAD   Paroxysmal atrial fibrillation (HCC)    Premature atrial contractions    Premature ventricular contraction    S/P ICD (internal cardiac defibrillator) procedure    Tachy-brady syndrome (HCC)    VT (ventricular tachycardia) ([place])     MEDICATIONS AT HOME: Prior to Admission medications   Medication Sig Start Date End Date Taking? Authorizing Provider  amiodarone (PACERONE) 200 MG tablet Take 0.5 tablets (100 mg total) by mouth daily. 02/23/22  Yes Bensimhon, Shaune Pascal, MD  calcium citrate (CALCITRATE - DOSED IN MG ELEMENTAL CALCIUM) 950 (200 Ca) MG tablet Take 1 tablet (200 mg of elemental calcium total) by mouth 2 (two) times daily. 05/11/21  Yes Joette Catching, PA-C  Cholecalciferol ([employer] D3) 25 MCG (1000 UT) capsule TAKE 1 CAPSULE BY MOUTH EVERY DAY 04/09/22  Yes Larey Dresser, MD   dapagliflozin propanediol (FARXIGA) 10 MG TABS tablet Take 1 tablet (10 mg total) by mouth daily. 10/07/21  Yes Bensimhon, Shaune Pascal, MD  ELIQUIS 5 MG TABS tablet TAKE 1 TABLET BY MOUTH TWICE A DAY 03/17/22  Yes Bensimhon, Shaune Pascal, MD  folic acid (FOLVITE) 1 MG tablet TAKE 1 TABLET BY MOUTH EVERY DAY 03/09/22  Yes Bensimhon, Shaune Pascal, MD  furosemide (LASIX) 40 MG tablet Take 1 tablet as needed for weight gain > 3 lb in a day or 5 lb in a week, leg edema, shortness of breath 05/20/21  Yes Bensimhon, Shaune Pascal, MD  levothyroxine (SYNTHROID) 200 MCG tablet Take 1 tablet (200 mcg total) by mouth daily before breakfast. Take 1 tablet (200 mcg total) by mouth daily before breakfast. Further refills must be from PCP. 08/14/21  Yes Elsie Stain, MD  losartan (COZAAR) 25 MG tablet Take 25 mg by mouth daily. 03/06/22  Yes [provider]  methotrexate (RHEUMATREX) 2.5 MG tablet TAKE 8 TABLETS BY MOUTH EVERY THURSDAY 07/27/21  Yes Larey Dresser, MD  mexiletine (MEXITIL)  200 MG capsule TAKE 1 CAPSULE BY MOUTH TWICE A DAY 01/05/22  Yes Bensimhon, Shaune Pascal, MD  ondansetron (ZOFRAN) 4 MG tablet Take 1 tablet (4 mg total) by mouth every 8 (eight) hours as needed for nausea or vomiting. 05/26/22  Yes Freeman Caldron M, PA-C  pantoprazole (PROTONIX) 40 MG tablet TAKE 1 TABLET BY MOUTH EVERY DAY 04/09/22  Yes Evans Lance, MD  polyethylene glycol powder (GLYCOLAX/MIRALAX) 17 GM/SCOOP powder Take 17 g by mouth 2 (two) times daily as needed. 05/26/22  Yes Freeman Caldron M, PA-C  potassium chloride SA (KLOR-CON M) 20 MEQ tablet Take 1 tablet (20 mEq total) by mouth when taking furosemide 05/20/21  Yes Bensimhon, Shaune Pascal, MD  rosuvastatin (CRESTOR) 40 MG tablet TAKE 1 TABLET BY MOUTH EVERY DAY 08/17/21  Yes Bensimhon, Shaune Pascal, MD  sacubitril-valsartan (ENTRESTO) 49-51 MG Take 1 tablet by mouth 2 (two) times daily. 11/19/21  Yes Bensimhon, Shaune Pascal, MD  spironolactone (ALDACTONE) 25 MG tablet Take 1 tablet (25 mg  total) by mouth daily. 02/23/22  Yes Bensimhon, Shaune Pascal, MD    ROS: Neg HEENT Neg resp Neg cardiac Neg GU Neg MS Neg psych Neg neuro  Objective:   Vitals:   05/26/22 1026  BP: 135/80  Pulse: (!) 58  SpO2: 100%  Weight: 157 lb 9.6 oz (71.5 kg)   Exam General appearance : Awake, alert, not in any distress. Speech Clear. Not toxic looking HEENT: Atraumatic and Normocephalic Neck: Supple, no JVD. No cervical lymphadenopathy.  Chest: Good air entry bilaterally, CTAB.  No rales/rhonchi/wheezing CVS: S1 S2 regular, no murmurs.  Abdomen: Bowel sounds present, mildly genralized tender and not distended with no gaurding, rigidity or rebound. Extremities: B/L Lower Ext shows no edema, both legs are warm to touch Neurology: Awake alert, and oriented X 3, CN II-XII intact, Non focal Skin: No Rash  Data Review Lab Results  Component Value Date   HGBA1C 6.0 (H) 04/23/2021   HGBA1C 6.4 02/05/2020    Assessment & Plan   1. Renal mass - CT ABDOMEN W WO CONTRAST; Future  2. Other specified disorders of kidney and ureter - CT ABDOMEN W WO CONTRAST; Future  3. Nausea Zofran sent - Ambulatory referral to Gastroenterology  4. Diverticulosis - Ambulatory referral to Gastroenterology  5. Hospital discharge follow-up  6. Constipation, unspecified constipation type - polyethylene glycol powder (GLYCOLAX/MIRALAX) 17 GM/SCOOP powder; Take 17 g by mouth 2 (two) times daily as needed.  Dispense: 3350 g; Refill: 1  7. Acquired hypothyroidism - Thyroid Panel With TSH Taking 142mg currently  8. Weight loss - Comprehensive metabolic panel - Lipase - CBC with Differential/Platelet  9. Blood in stool - Ambulatory referral to Gastroenterology -CBC  BP-continue holding losartan and entresto.  Check BP daily and if avg >140/90, call for instruction.  Continue midodrine for now.     Return in about 6 weeks (around 07/07/2022) for Dr WJoya Gaskinsfor medication management and chronic  conditions.  The patient was given clear instructions to go to ER or return to medical center if symptoms don't improve, worsen or new problems develop. The patient verbalized understanding. The patient was told to call to get lab results if they haven't heard anything in the next week.      AFreeman Caldron PA-C [facility] [place]  05/26/2022, 10:49 AM

## 2022-05-26 NOTE — Addendum Note (Signed)
Addended by: Argentina Donovan on: 05/26/2022 11:16 AM   Modules accepted: Orders

## 2022-05-26 NOTE — Telephone Encounter (Signed)
Dr. Bryan Lemma,  We received an urgent referral for this patient from her PCP for weight loss, constipation and blood in stool.  She states GI hx was "way over 10 years ago".  Please review and advise urgency and scheduling?  Thanks  DOD 3/13/24pm

## 2022-05-27 ENCOUNTER — Other Ambulatory Visit: Payer: Self-pay | Admitting: Physician Assistant

## 2022-05-27 DIAGNOSIS — E039 Hypothyroidism, unspecified: Secondary | ICD-10-CM

## 2022-05-27 DIAGNOSIS — R748 Abnormal levels of other serum enzymes: Secondary | ICD-10-CM

## 2022-05-27 LAB — COMPREHENSIVE METABOLIC PANEL
ALT: 17 IU/L (ref 0–32)
AST: 26 IU/L (ref 0–40)
Albumin/Globulin Ratio: 2 (ref 1.2–2.2)
Albumin: 4.5 g/dL (ref 3.8–4.8)
Alkaline Phosphatase: 166 IU/L — ABNORMAL HIGH (ref 44–121)
BUN/Creatinine Ratio: 8 — ABNORMAL LOW (ref 12–28)
BUN: 14 mg/dL (ref 8–27)
Bilirubin Total: 0.4 mg/dL (ref 0.0–1.2)
CO2: 20 mmol/L (ref 20–29)
Calcium: 10.6 mg/dL — ABNORMAL HIGH (ref 8.7–10.3)
Chloride: 102 mmol/L (ref 96–106)
Creatinine, Ser: 1.74 mg/dL — ABNORMAL HIGH (ref 0.57–1.00)
Globulin, Total: 2.3 g/dL (ref 1.5–4.5)
Glucose: 89 mg/dL (ref 70–99)
Potassium: 4.2 mmol/L (ref 3.5–5.2)
Sodium: 139 mmol/L (ref 134–144)
Total Protein: 6.8 g/dL (ref 6.0–8.5)
eGFR: 30 mL/min/{1.73_m2} — ABNORMAL LOW (ref >59)

## 2022-05-27 LAB — CBC WITH DIFFERENTIAL/PLATELET
Basophils Absolute: 0 10*3/uL (ref 0.0–0.2)
Basos: 0 %
EOS (ABSOLUTE): 0.1 10*3/uL (ref 0.0–0.4)
Eos: 1 %
Hematocrit: 35.4 % (ref 34.0–46.6)
Hemoglobin: 11.4 g/dL (ref 11.1–15.9)
Immature Grans (Abs): 0 10*3/uL (ref 0.0–0.1)
Immature Granulocytes: 1 %
Lymphocytes Absolute: 1 10*3/uL (ref 0.7–3.1)
Lymphs: 20 %
MCH: 31.8 pg (ref 26.6–33.0)
MCHC: 32.2 g/dL (ref 31.5–35.7)
MCV: 99 fL — ABNORMAL HIGH (ref 79–97)
Monocytes Absolute: 0.7 10*3/uL (ref 0.1–0.9)
Monocytes: 14 %
Neutrophils Absolute: 3.1 10*3/uL (ref 1.4–7.0)
Neutrophils: 64 %
Platelets: 117 10*3/uL — ABNORMAL LOW (ref 150–450)
RBC: 3.59 x10E6/uL — ABNORMAL LOW (ref 3.77–5.28)
RDW: 15.3 % (ref 11.7–15.4)
WBC: 4.9 10*3/uL (ref 3.4–10.8)

## 2022-05-27 LAB — THYROID PANEL WITH TSH
Free Thyroxine Index: 5.9 — ABNORMAL HIGH (ref 1.2–4.9)
T3 Uptake Ratio: 39 % (ref 24–39)
T4, Total: 15.1 ug/dL — ABNORMAL HIGH (ref 4.5–12.0)
TSH: 0.008 u[IU]/mL — ABNORMAL LOW (ref 0.450–4.500)

## 2022-05-27 LAB — LIPASE: Lipase: 28 U/L (ref 14–85)

## 2022-05-27 MED ORDER — LEVOTHYROXINE SODIUM 50 MCG PO TABS: 150.0000 ug | ORAL_TABLET | Freq: Every day | ORAL | 3 refills | Status: DC

## 2022-05-27 NOTE — Telephone Encounter (Signed)
Called and left patient a detailed vm on her mobile phone asking that she call back to clarify where she would like to establish care here, at her previous GI, or [place]. I informed pt that she is currently scheduled at our office in April, but if she wishes to establish care elsewhere to give [place] a call back and we will cancel this appt.

## 2022-05-27 NOTE — Telephone Encounter (Signed)
Requested medication (s) are due for refill today: No  Requested medication (s) are on the active medication list: Yes  Last refill:  05/26/22  Future visit scheduled:   Notes to clinic:  See pharmacy request.    Requested Prescriptions  Pending Prescriptions Disp Refills   polyethylene glycol powder (GLYCOLAX/MIRALAX) 17 GM/SCOOP powder [Pharmacy Med Name: POLYETHYLENE GLYCOL 3350 POWD] 238 g 1    Sig: DISSOLVE 17 GRAMS IN 8 OZ OF FLUID LIQUID [place] AS DIRECTED     Gastroenterology:  Lax[place] Passed - 05/26/2022 10:58 AM      Passed - Valid encounter within last 12 months    Recent Outpatient Visits           Yesterday Other specified disorders of kidney and ureter   [place], [place]   3 months ago Needs flu shot   [place], [place]   8 months ago Primary hypertension   [place], [place]   10 months ago [place], [place]       Future Appointments             In 1 month Joya Gaskins Burn[place] Harry, MD [place]   In 3 months Joya Gaskins Burnett Harry, MD [place]

## 2022-05-28 ENCOUNTER — Telehealth: Payer: Self-pay

## 2022-05-28 NOTE — Telephone Encounter (Signed)
-----   Message from Argentina Donovan, [place] sent at 05/27/2022 10:28 AM EDT ----- Please call patient.  Her thyroid dose is too high.  I am decreasing her dose from 161mcg to 125mcg Mondays to Saturday and she should skip taking a dose on Sundays. New prescription for levothyroxine was sent. The dose being too high can cause weight loss and explain some of her other symptoms.  Alkaline phosphatase also elevated and we will recheck both at f/up with Dr Joya Gaskins.   Drink 80 ounces water daily due to decreased kidney function.  Avoid aspirin or alcohol bc platelets are low.  Thanks, Freeman Caldron, PA-C

## 2022-05-28 NOTE — Telephone Encounter (Signed)
Pt was called and is aware of results, DOB was confirmed.   She is also aware of ct appointment

## 2022-06-02 ENCOUNTER — Telehealth: Payer: Self-pay | Admitting: Gastroenterology

## 2022-06-02 NOTE — Telephone Encounter (Signed)
Patient would like to follow up with [facility]. See alternate telephone encounter for details. April appt cancelled.

## 2022-06-02 NOTE — Telephone Encounter (Signed)
Returned call to patient, her daughter in law is present for the conversation. Daughter in law reports that patient is having "10/10 excruciating rectal pain" and she plans to take her to urgent care today. I informed her that is the best thing for her do since patient is in that much pain and we have never seen her in the office. I also told pt that I have been trying to reach her b/c her PCP sent a referral over but it looks like she previously followed with [facility]. I asked patient if she would like to establish care here or follow up with [facility], pt wishes to follow up with previous practice. I informed them that PCP may need to place a new referral for her and we will cancel April appt at our office. Pt and her daughter in law verbalized understanding.

## 2022-06-02 NOTE — Telephone Encounter (Signed)
Patient called in to request to speak with a nurse she is having some rectal pain .PLease advise

## 2022-06-07 ENCOUNTER — Ambulatory Visit: Payer: Self-pay | Admitting: *Deleted

## 2022-06-07 DIAGNOSIS — K625 Hemorrhage of anus and rectum: Secondary | ICD-10-CM

## 2022-06-07 DIAGNOSIS — K6289 Other specified diseases of anus and rectum: Secondary | ICD-10-CM

## 2022-06-07 NOTE — Telephone Encounter (Signed)
Noted  

## 2022-06-07 NOTE — Telephone Encounter (Signed)
  Chief Complaint: Rectal pain Symptoms: Rectal pain 8/10, bleeding. Seen in ED 06/02/22, "Internal and external hemorrhoids. States had CT without contrast. Vomiting last 2 days, none presently.  Had small stool today "Little hard." Also reports dysuria, frequency and urgency. BP 160/80. Rectal bleeding with and without stools, with clots. Generalized weakness Frequency: Seen in ED 06/02/22 Pertinent Negatives: Patient denies  Disposition: [] ED /[] Urgent Care (no appt availability in office) / [] Appointment(In office/virtual)/ []  [place] [facility]/ [] Home Care/ [x] Refused Recommended Disposition /[] [place] [facility]/ []  Follow-up with PCP Additional Notes:  No availability at practice. Advised UC/ED, declines. Requesting GI referral "Within a week if possible." Assured NT would route to practice for PCPs review and final disposition. Care advise provided. Information from Caregiver, [id number], pt present during call. Please advise. Pt [phone] Caregiver: [phone] Reason for Disposition  [1] MODERATE rectal bleeding (small blood clots, passing blood without stool, or toilet water turns red) AND [2] more than once a day  Answer Assessment - Initial Assessment Questions 1. APPEARANCE of BLOOD: "What color is it?" "Is it passed separately, on the surface of the stool, or mixed in with the stool?"      Red with clots, mucous 2. AMOUNT: "How much blood was passed?"      "Seems like a lot." 3. FREQUENCY: "How many times has blood been passed with the stools?"      With each one and at times  4. ONSET: "When was the blood first seen in the stools?" (Days or weeks)      In ED 06/02/22 5. DIARRHEA: "Is there also some diarrhea?" If Yes, ask: "How many diarrhea stools in the past 24 hours?"      No 6. CONSTIPATION: "Do you have constipation?" If Yes, ask: "How bad is it?"    Small stool, little hard 7. RECURRENT SYMPTOMS: "Have you had blood in your stools before?" If Yes, ask: "When  was the last time?" and "What happened that time?"       8. BLOOD THINNERS: "Do you take any blood thinners?" (e.g., Coumadin/warfarin, Pradaxa/dabigatran, aspirin)      9. OTHER SYMPTOMS: "Do you have any other symptoms?"  (e.g., abdomen pain, vomiting, dizziness, fever)     Vomiting at times, none today. Burning and pain with urination, generalized weakness  Protocols used: Rectal Bleeding-A-AH

## 2022-06-07 NOTE — Telephone Encounter (Signed)
Routing to PCP for review.

## 2022-06-07 NOTE — Telephone Encounter (Signed)
I will send ref to GI on urgent basis.  Also consider mobile unit  I am willing to see her Thursday as an add on but not tomorrow or wednesday

## 2022-06-14 NOTE — Progress Notes (Signed)
Remote ICD transmission.   

## 2022-06-18 ENCOUNTER — Other Ambulatory Visit (HOSPITAL_COMMUNITY): Payer: Self-pay | Admitting: Internal Medicine

## 2022-06-27 ENCOUNTER — Other Ambulatory Visit (HOSPITAL_COMMUNITY): Payer: Self-pay | Admitting: Internal Medicine

## 2022-06-28 ENCOUNTER — Telehealth: Payer: Self-pay | Admitting: Critical Care Medicine

## 2022-06-28 NOTE — Telephone Encounter (Signed)
Contacted Sheri Morgan to schedule their annual wellness visit. Appointment made for 06/30/22.  [name] AWV direct phone # [phone]

## 2022-06-30 ENCOUNTER — Ambulatory Visit: Payer: Medicare Other | Attending: Critical Care Medicine

## 2022-06-30 VITALS — Ht 63.5 in | Wt 146.0 lb

## 2022-06-30 DIAGNOSIS — Z0000 Encounter for general adult medical examination without abnormal findings: Secondary | ICD-10-CM

## 2022-06-30 NOTE — Patient Instructions (Signed)
Sheri Morgan , Thank you for taking time to come for your Medicare Wellness Visit. I appreciate your ongoing commitment to your health goals. Please review the following plan we discussed and let me know if I can assist you in the future.   These are the goals we discussed:  Goals      Patient Stated     06/30/2022, wants to get stronger and get back to driving        This is a list of the screening recommended for you and due dates:  Health Maintenance  Topic Date Due   Hepatitis C Screening: USPSTF Recommendation to screen - Ages 83-79 yo.  Never done   DTaP/Tdap/Td vaccine (1 - Tdap) Never done   DEXA scan (bone density measurement)  Never done   COVID-19 Vaccine (3 - 2023-24 season) 11/13/2021   Flu Shot  10/14/2022   Medicare Annual Wellness Visit  06/30/2023   Pneumonia Vaccine  Completed   HPV Vaccine  Aged Out   Zoster (Shingles) Vaccine  Discontinued    Advanced directives: Advance directive discussed with you today.   Conditions/risks identified: none  Next appointment: Follow up in one year for your annual wellness visit    Preventive Care 65 Years and Older, Female Preventive care refers to lifestyle choices and visits with your health care provider that can promote health and wellness. What does preventive care include? A yearly physical exam. This is also called an annual well check. Dental exams once or twice a year. Routine eye exams. Ask your health care provider how often you should have your eyes checked. Personal lifestyle choices, including: Daily care of your teeth and gums. Regular physical activity. Eating a healthy diet. Avoiding tobacco and drug use. Limiting alcohol use. Practicing safe sex. Taking low-dose aspirin every day. Taking vitamin and mineral supplements as recommended by your health care provider. What happens during an annual well check? The services and screenings done by your health care provider during your annual well check will  depend on your age, overall health, lifestyle risk factors, and family history of disease. Counseling  Your health care provider may ask you questions about your: Alcohol use. Tobacco use. Drug use. Emotional well-being. Home and relationship well-being. Sexual activity. Eating habits. History of falls. Memory and ability to understand (cognition). Work and work Astronomer. Reproductive health. Screening  You may have the following tests or measurements: Height, weight, and BMI. Blood pressure. Lipid and cholesterol levels. These may be checked every 5 years, or more frequently if you are over 53 years old. Skin check. Lung cancer screening. You may have this screening every year starting at age 66 if you have a 30-pack-year history of smoking and currently smoke or have quit within the past 15 years. Fecal occult blood test (FOBT) of the stool. You may have this test every year starting at age 21. Flexible sigmoidoscopy or colonoscopy. You may have a sigmoidoscopy every 5 years or a colonoscopy every 10 years starting at age 72. Hepatitis C blood test. Hepatitis B blood test. Sexually transmitted disease (STD) testing. Diabetes screening. This is done by checking your blood sugar (glucose) after you have not eaten for a while (fasting). You may have this done every 1-3 years. Bone density scan. This is done to screen for osteoporosis. You may have this done starting at age 50. Mammogram. This may be done every 1-2 years. Talk to your health care provider about how often you should have regular mammograms. Talk with  your health care provider about your test results, treatment options, and if necessary, the need for more tests. Vaccines  Your health care provider may recommend certain vaccines, such as: Influenza vaccine. This is recommended every year. Tetanus, diphtheria, and acellular pertussis (Tdap, Td) vaccine. You may need a Td booster every 10 years. Zoster vaccine. You may  need this after age 54. Pneumococcal 13-valent conjugate (PCV13) vaccine. One dose is recommended after age 25. Pneumococcal polysaccharide (PPSV23) vaccine. One dose is recommended after age 49. Talk to your health care provider about which screenings and vaccines you need and how often you need them. This information is not intended to replace advice given to you by your health care provider. Make sure you discuss any questions you have with your health care provider. Document Released: 03/28/2015 Document Revised: 11/19/2015 Document Reviewed: 12/31/2014 Elsevier Interactive Patient Education  2017 [place] Prevention in the Home Falls can cause injuries. They can happen to people of all ages. There are many things you can do to make your home safe and to help prevent falls. What can I do on the outside of my home? Regularly fix the edges of walkways and driveways and fix any cracks. Remove anything that might make you trip as you walk through a door, such as a raised step or threshold. Trim any bushes or trees on the path to your home. Use bright outdoor lighting. Clear any walking paths of anything that might make someone trip, such as rocks or tools. Regularly check to see if handrails are loose or broken. Make sure that both sides of any steps have handrails. Any raised decks and porches should have guardrails on the edges. Have any leaves, snow, or ice cleared regularly. Use sand or salt on walking paths during winter. Clean up any spills in your garage right away. This includes oil or grease spills. What can I do in the bathroom? Use night lights. Install grab bars by the toilet and in the tub and shower. Do not use towel bars as grab bars. Use non-skid mats or decals in the tub or shower. If you need to sit down in the shower, use a plastic, non-slip stool. Keep the floor dry. Clean up any water that spills on the floor as soon as it happens. Remove soap buildup in  the tub or shower regularly. Attach bath mats securely with double-sided non-slip rug tape. Do not have throw rugs and other things on the floor that can make you trip. What can I do in the bedroom? Use night lights. Make sure that you have a light by your bed that is easy to reach. Do not use any sheets or blankets that are too big for your bed. They should not hang down onto the floor. Have a firm chair that has side arms. You can use this for support while you get dressed. Do not have throw rugs and other things on the floor that can make you trip. What can I do in the kitchen? Clean up any spills right away. Avoid walking on wet floors. Keep items that you use a lot in easy-to-reach places. If you need to reach something above you, use a strong step stool that has a grab bar. Keep electrical cords out of the way. Do not use floor polish or wax that makes floors slippery. If you must use wax, use non-skid floor wax. Do not have throw rugs and other things on the floor that can make you trip.  What can I do with my stairs? Do not leave any items on the stairs. Make sure that there are handrails on both sides of the stairs and use them. Fix handrails that are broken or loose. Make sure that handrails are as long as the stairways. Check any carpeting to make sure that it is firmly attached to the stairs. Fix any carpet that is loose or worn. Avoid having throw rugs at the top or bottom of the stairs. If you do have throw rugs, attach them to the floor with carpet tape. Make sure that you have a light switch at the top of the stairs and the bottom of the stairs. If you do not have them, ask someone to add them for you. What else can I do to help prevent falls? Wear shoes that: Do not have high heels. Have rubber bottoms. Are comfortable and fit you well. Are closed at the toe. Do not wear sandals. If you use a stepladder: Make sure that it is fully opened. Do not climb a closed  stepladder. Make sure that both sides of the stepladder are locked into place. Ask someone to hold it for you, if possible. Clearly mark and make sure that you can see: Any grab bars or handrails. First and last steps. Where the edge of each step is. Use tools that help you move around (mobility aids) if they are needed. These include: Canes. Walkers. Scooters. Crutches. Turn on the lights when you go into a dark area. Replace any light bulbs as soon as they burn out. Set up your furniture so you have a clear path. Avoid moving your furniture around. If any of your floors are uneven, fix them. If there are any pets around you, be aware of where they are. Review your medicines with your doctor. Some medicines can make you feel dizzy. This can increase your chance of falling. Ask your doctor what other things that you can do to help prevent falls. This information is not intended to replace advice given to you by your health care provider. Make sure you discuss any questions you have with your health care provider. Document Released: 12/26/2008 Document Revised: 08/07/2015 Document Reviewed: 04/05/2014 Elsevier Interactive Patient Education  2017 [employer].

## 2022-06-30 NOTE — Progress Notes (Signed)
I connected with  Sheri Morgan on 06/30/22 by a audio enabled telemedicine application and verified that I am speaking with the correct person using two identifiers.  Patient Location: Home  Provider Location: Office/Clinic  I discussed the limitations of evaluation and management by telemedicine. The patient expressed understanding and agreed to proceed.  Subjective:   Sheri Morgan is a 77 y.o. female who presents for Medicare Annual (Subsequent) preventive examination.  Review of Systems     Cardiac Risk Factors include: advanced age (>18men, >75 women);hypertension;dyslipidemia     Objective:    Today's Vitals   06/30/22 1541  Weight: 146 lb (66.2 kg)  Height: 5' 3.5" (1.613 m)   Body mass index is 25.46 kg/m.     06/30/2022    3:52 PM 04/30/2021    7:42 AM 11/13/2020    5:08 PM  Advanced Directives  Does Patient Have a Medical Advance Directive? No No No  Would patient like information on creating a medical advance directive?  Yes (Inpatient - patient requests chaplain consult to create a medical advance directive) Yes (ED - Information included in AVS)    Current Medications (verified) Outpatient Encounter Medications as of 06/30/2022  Medication Sig   amiodarone (PACERONE) 200 MG tablet TAKE 1 TABLET BY MOUTH EVERY DAY   calcium citrate (CALCITRATE - DOSED IN MG ELEMENTAL CALCIUM) 950 (200 Ca) MG tablet Take 1 tablet (200 mg of elemental calcium total) by mouth 2 (two) times daily.   Cholecalciferol ([employer] D3) 25 MCG (1000 UT) capsule TAKE 1 CAPSULE BY MOUTH EVERY DAY   dapagliflozin propanediol (FARXIGA) 10 MG TABS tablet Take 1 tablet (10 mg total) by mouth daily.   ELIQUIS 5 MG TABS tablet TAKE 1 TABLET BY MOUTH TWICE A DAY   folic acid (FOLVITE) 1 MG tablet TAKE 1 TABLET BY MOUTH EVERY DAY   levothyroxine (SYNTHROID) 50 MCG tablet Take 3 tablets (150 mcg total) by mouth daily before breakfast. Monday to Saturdays.  Skip Sunday dose   mexiletine (MEXITIL) 200 MG  capsule TAKE 1 CAPSULE BY MOUTH TWICE A DAY   ondansetron (ZOFRAN) 4 MG tablet Take 1 tablet (4 mg total) by mouth every 8 (eight) hours as needed for nausea or vomiting.   pantoprazole (PROTONIX) 40 MG tablet TAKE 1 TABLET BY MOUTH EVERY DAY   polyethylene glycol powder (GLYCOLAX/MIRALAX) 17 GM/SCOOP powder Take 17 g by mouth 2 (two) times daily as needed.   rosuvastatin (CRESTOR) 40 MG tablet TAKE 1 TABLET BY MOUTH EVERY DAY   furosemide (LASIX) 40 MG tablet Take 1 tablet as needed for weight gain > 3 lb in a day or 5 lb in a week, leg edema, shortness of breath (Patient not taking: Reported on 06/30/2022)   losartan (COZAAR) 25 MG tablet Take 25 mg by mouth daily. (Patient not taking: Reported on 06/30/2022)   methotrexate (RHEUMATREX) 2.5 MG tablet TAKE 8 TABLETS BY MOUTH EVERY THURSDAY (Patient not taking: Reported on 06/30/2022)   midodrine (PROAMATINE) 2.5 MG tablet Take 7.5 mg by mouth 3 (three) times daily with meals. (Patient not taking: Reported on 06/30/2022)   potassium chloride SA (KLOR-CON M) 20 MEQ tablet Take 1 tablet (20 mEq total) by mouth when taking furosemide (Patient not taking: Reported on 06/30/2022)   sacubitril-valsartan (ENTRESTO) 49-51 MG Take 1 tablet by mouth 2 (two) times daily. (Patient not taking: Reported on 06/30/2022)   spironolactone (ALDACTONE) 25 MG tablet Take 1 tablet (25 mg total) by mouth daily. (Patient not  taking: Reported on 06/30/2022)   No facility-administered encounter medications on file as of 06/30/2022.    Allergies (verified) Codeine   History: Past Medical History:  Diagnosis Date   Anxiety    Atypical atrial flutter    Cardiac sarcoidosis    Chronic combined systolic and diastolic CHF (congestive heart failure)    Fibromyalgia    Hypertension    Non-ischemic cardiomyopathy    LHC 04/23/2021: 30% stenosis of proximal LAD   Paroxysmal atrial fibrillation    Premature atrial contractions    Premature ventricular contraction    S/P ICD  (internal cardiac defibrillator) procedure    Tachy-brady syndrome    VT (ventricular tachycardia)    Past Surgical History:  Procedure Laterality Date   ABDOMINAL HYSTERECTOMY     CARDIOVERSION N/A 04/30/2021   Procedure: CARDIOVERSION;  Surgeon: Dolores Patty, MD;  Location: [facility] ENDOSCOPY;  Service: Cardiovascular;  Laterality: N/A;   CARDIOVERSION N/A 05/08/2021   Procedure: CARDIOVERSION;  Surgeon: Dolores Patty, MD;  Location: [facility] ENDOSCOPY;  Service: Cardiovascular;  Laterality: N/A;   ICD IMPLANT N/A 05/04/2021   Procedure: ICD IMPLANT;  Surgeon: Marinus Maw, MD;  Location: [facility] INVASIVE CV LAB;  Service: Cardiovascular;  Laterality: N/A;   LEFT HEART CATH AND CORONARY ANGIOGRAPHY N/A 04/23/2021   Procedure: LEFT HEART CATH AND CORONARY ANGIOGRAPHY;  Surgeon: Kathleene Hazel, MD;  Location: [facility] INVASIVE CV LAB;  Service: Cardiovascular;  Laterality: N/A;   LUMBAR LAMINECTOMY     TEE WITHOUT CARDIOVERSION N/A 05/08/2021   Procedure: TRANSESOPHAGEAL ECHOCARDIOGRAM (TEE);  Surgeon: Dolores Patty, MD;  Location: [facility] ENDOSCOPY;  Service: Cardiovascular;  Laterality: N/A;   THYROIDECTOMY     Family History  Problem Relation Age of Onset   Diabetes Mother    Alcoholism Father    Social History   Socioeconomic History   Marital status: Divorced    Spouse name: Not on file   Number of children: Not on file   Years of education: Not on file   Highest education level: Not on file  Occupational History   Not on file  Tobacco Use   Smoking status: Never   Smokeless tobacco: Never  Vaping Use   Vaping Use: Never used  Substance and Sexual Activity   Alcohol use: No   Drug use: No   Sexual activity: Never  Other Topics Concern   Not on file  Social History Narrative   Retired from [employer]   Social Determinants of Health   Financial Resource Strain: Low Risk  (06/30/2022)   Overall Financial Resource Strain (CARDIA)    Difficulty of Paying Living Expenses:  Not hard at all  Food Insecurity: No Food Insecurity (06/30/2022)   Hunger Vital Sign    Worried About Running Out of Food in the Last Year: Never true    Ran Out of Food in the Last Year: Never true  Transportation Needs: No Transportation Needs (06/30/2022)   PRAPARE - Administrator, Civil Service (Medical): No    Lack of Transportation (Non-Medical): No  Physical Activity: Insufficiently Active (06/30/2022)   Exercise Vital Sign    Days of Exercise per Week: 5 days    Minutes of Exercise per Session: 20 min  Stress: No Stress Concern Present (06/30/2022)   [employer] of Occupational Health - Occupational Stress Questionnaire    Feeling of Stress : Not at all  Social Connections: Moderately Integrated (11/13/2020)   Social Connection and Isolation Panel [NHANES]  Frequency of Communication with Friends and Family: More than three times a week    Frequency of Social Gatherings with Friends and Family: Not on file    Attends Religious Services: More than 4 times per year    Active Member of [employer] or Organizations: Yes    Attends Banker Meetings: Never    Marital Status: Divorced    Tobacco Counseling Counseling given: Not Answered   Clinical Intake:  Pre-visit preparation completed: Yes  Pain : No/denies pain     Nutritional Status: BMI 25 -29 Overweight Nutritional Risks: None Diabetes: No  How often do you need to have someone help you when you read instructions, pamphlets, or other written materials from your doctor or pharmacy?: 1 - Never  Diabetic? no  Interpreter Needed?: No  Information entered by :: NAllen LPN   Activities of Daily Living    06/30/2022    3:55 PM  In your present state of health, do you have any difficulty performing the following activities:  Hearing? 0  Vision? 1  Difficulty concentrating or making decisions? 0  Walking or climbing stairs? 0  Dressing or bathing? 0  Doing errands, shopping? 0   Preparing Food and eating ? N  Using the Toilet? N  In the past six months, have you accidently leaked urine? Y  Do you have problems with loss of bowel control? N  Managing your Medications? N  Managing your Finances? N  Housekeeping or managing your Housekeeping? N    Patient Care Team: Storm Frisk, MD as PCP - General (Pulmonary Disease) Parke Poisson, MD as PCP - Cardiology (Cardiology)  Indicate any recent Medical Services you may have received from other than [facility] providers in the past year (date may be approximate).     Assessment:   This is a routine wellness examination for Sheri Morgan.  Hearing/Vision screen Vision Screening - Comments:: No regular eye exams  Dietary issues and exercise activities discussed: Current Exercise Habits: The patient does not participate in regular exercise at present   Goals Addressed             This Visit's Progress    Patient Stated       06/30/2022, wants to get stronger and get back to driving       Depression Screen    06/30/2022    3:54 PM 05/26/2022   11:27 AM 02/24/2022   11:08 AM 09/16/2021   11:04 AM 11/12/2020    5:49 PM 02/05/2020   10:48 AM  PHQ 2/9 Scores  PHQ - 2 Score 0 PHQ- 9 Score  Fall Risk    06/30/2022    3:53 PM 05/26/2022   10:28 AM 02/24/2022   10:59 AM 09/16/2021   10:59 AM 07/13/2021    9:30 AM  Fall Risk   Falls in the past year? 1 0 0 0 0  Comment going up the stairs and stumbled      Number falls in past yr: 0 0 0 0 0  Injury with Fall? 0 0 0 0 0  Risk for fall due to : Medication side effect;Impaired balance/gait No Fall Risks No Fall Risks No Fall Risks No Fall Risks  Follow up Falls prevention discussed;Education provided;Falls evaluation completed   Falls evaluation completed     FALL RISK PREVENTION PERTAINING TO THE HOME:  Any stairs in or around the home? Yes  If  so, are there any without handrails? No  Home free of loose throw rugs in walkways, pet  beds, electrical cords, etc? Yes  Adequate lighting in your home to reduce risk of falls? Yes   ASSISTIVE DEVICES UTILIZED TO PREVENT FALLS:  Life alert? No  Use of a cane, walker or w/c? No  Grab bars in the bathroom? No  Shower chair or bench in shower? Yes  Elevated toilet seat or a handicapped toilet? No   TIMED UP AND GO:  Was the test performed? No .      Cognitive Function:        06/30/2022    4:09 PM 11/12/2020    6:21 PM  6CIT Screen  What Year? 0 points 0 points  What month? 0 points 0 points  What time? 0 points 0 points  Count back from 20 0 points 0 points  Months in reverse 0 points 0 points  Repeat phrase 4 points 0 points  Total Score 4 points 0 points    Immunizations Immunization History  Administered Date(s) Administered   PFIZER(Purple Top)SARS-COV-2 Vaccination 08/31/2019, 09/21/2019   PNEUMOCOCCAL CONJUGATE-20 07/13/2021    TDAP status: Due, Education has been provided regarding the importance of this vaccine. Advised may receive this vaccine at local pharmacy or Health Dept. Aware to provide a copy of the vaccination Morgan if obtained from local pharmacy or Health Dept. Verbalized acceptance and understanding.  Flu Vaccine status: Declined, Education has been provided regarding the importance of this vaccine but patient still declined. Advised may receive this vaccine at local pharmacy or Health Dept. Aware to provide a copy of the vaccination Morgan if obtained from local pharmacy or Health Dept. Verbalized acceptance and understanding.  Pneumococcal vaccine status: Up to date  Covid-19 vaccine status: Completed vaccines  Qualifies for Shingles Vaccine? Yes   Zostavax completed No   Shingrix Completed?: No.    Education has been provided regarding the importance of this vaccine. Patient has been advised to call insurance company to determine out of pocket expense if they have not yet received this vaccine. Advised may also receive vaccine at  local pharmacy or Health Dept. Verbalized acceptance and understanding.  Screening Tests Health Maintenance  Topic Date Due   Hepatitis C Screening  Never done   DTaP/Tdap/Td (1 - Tdap) Never done   DEXA SCAN  Never done   Medicare Annual Wellness (AWV)  11/12/2021   COVID-19 Vaccine (3 - 2023-24 season) 11/13/2021   INFLUENZA VACCINE  10/14/2022   Pneumonia Vaccine 79+ Years old  Completed   HPV VACCINES  Aged Out   Zoster Vaccines- Shingrix  Discontinued    Health Maintenance  Health Maintenance Due  Topic Date Due   Hepatitis C Screening  Never done   DTaP/Tdap/Td (1 - Tdap) Never done   DEXA SCAN  Never done   Medicare Annual Wellness (AWV)  11/12/2021   COVID-19 Vaccine (3 - 2023-24 season) 11/13/2021    Colorectal cancer screening: looking to do colonoscopy this year  Mammogram status: No longer required due to age.  Bone Density status: due  Lung Cancer Screening: (Low Dose CT Chest recommended if Age 67-80 years, 30 pack-year currently smoking OR have quit w/in 15years.) does not qualify.   Lung Cancer Screening Referral: no  Additional Screening:  Hepatitis C Screening: does qualify;  Vision Screening: Recommended annual ophthalmology exams for early detection of glaucoma and other disorders of the eye. Is the patient up to date with their annual  eye exam?  No  Who is the provider or what is the name of the office in which the patient attends annual eye exams? none If pt is not established with a provider, would they like to be referred to a provider to establish care? No .   Dental Screening: Recommended annual dental exams for proper oral hygiene  Community Resource Referral / Chronic Care Management: CRR required this visit?  No   CCM required this visit?  No      Plan:     I have personally reviewed and noted the following in the patient's chart:   Medical and social history Use of alcohol, tobacco or illicit drugs  Current medications and  supplements including opioid prescriptions. Patient is not currently taking opioid prescriptions. Functional ability and status Nutritional status Physical activity Advanced directives List of other physicians Hospitalizations, surgeries, and ER visits in previous 12 months Vitals Screenings to include cognitive, depression, and falls Referrals and appointments  In addition, I have reviewed and discussed with patient certain preventive protocols, quality metrics, and best practice recommendations. A written personalized care plan for preventive services as well as general preventive health recommendations were provided to patient.     Barb Merino, LPN   1/61/0960   Nurse Notes: none  Due to this being a virtual visit, the after visit summary with patients personalized plan was offered to patient via mail or my-chart. Patient would like to access on my-chart

## 2022-07-06 ENCOUNTER — Other Ambulatory Visit (HOSPITAL_COMMUNITY): Payer: Self-pay | Admitting: Cardiology

## 2022-07-06 ENCOUNTER — Other Ambulatory Visit (HOSPITAL_COMMUNITY): Payer: Self-pay | Admitting: Internal Medicine

## 2022-07-06 DIAGNOSIS — I5022 Chronic systolic (congestive) heart failure: Secondary | ICD-10-CM

## 2022-07-07 ENCOUNTER — Ambulatory Visit: Payer: Medicare Other | Admitting: Physician Assistant

## 2022-07-11 ENCOUNTER — Other Ambulatory Visit: Payer: Self-pay | Admitting: Critical Care Medicine

## 2022-07-11 DIAGNOSIS — E039 Hypothyroidism, unspecified: Secondary | ICD-10-CM

## 2022-07-20 ENCOUNTER — Encounter: Payer: Self-pay | Admitting: Critical Care Medicine

## 2022-07-20 ENCOUNTER — Ambulatory Visit: Payer: Medicare Other | Attending: Critical Care Medicine | Admitting: Critical Care Medicine

## 2022-07-20 VITALS — BP 133/81 | HR 57 | Wt 147.6 lb

## 2022-07-20 DIAGNOSIS — N1831 Chronic kidney disease, stage 3a: Secondary | ICD-10-CM

## 2022-07-20 DIAGNOSIS — I10 Essential (primary) hypertension: Secondary | ICD-10-CM | POA: Diagnosis not present

## 2022-07-20 DIAGNOSIS — I5043 Acute on chronic combined systolic (congestive) and diastolic (congestive) heart failure: Secondary | ICD-10-CM | POA: Insufficient documentation

## 2022-07-20 DIAGNOSIS — I480 Paroxysmal atrial fibrillation: Secondary | ICD-10-CM

## 2022-07-20 DIAGNOSIS — K219 Gastro-esophageal reflux disease without esophagitis: Secondary | ICD-10-CM

## 2022-07-20 DIAGNOSIS — R570 Cardiogenic shock: Secondary | ICD-10-CM | POA: Insufficient documentation

## 2022-07-20 DIAGNOSIS — M4802 Spinal stenosis, cervical region: Secondary | ICD-10-CM

## 2022-07-20 DIAGNOSIS — I5020 Unspecified systolic (congestive) heart failure: Secondary | ICD-10-CM | POA: Diagnosis not present

## 2022-07-20 DIAGNOSIS — D8685 Sarcoid myocarditis: Secondary | ICD-10-CM

## 2022-07-20 DIAGNOSIS — E039 Hypothyroidism, unspecified: Secondary | ICD-10-CM | POA: Diagnosis not present

## 2022-07-20 DIAGNOSIS — G992 Myelopathy in diseases classified elsewhere: Secondary | ICD-10-CM

## 2022-07-20 DIAGNOSIS — L600 Ingrowing nail: Secondary | ICD-10-CM

## 2022-07-20 DIAGNOSIS — B351 Tinea unguium: Secondary | ICD-10-CM

## 2022-07-20 DIAGNOSIS — I5042 Chronic combined systolic (congestive) and diastolic (congestive) heart failure: Secondary | ICD-10-CM

## 2022-07-20 MED ORDER — PANTOPRAZOLE SODIUM 40 MG PO TBEC: 40.0000 mg | DELAYED_RELEASE_TABLET | Freq: Every day | ORAL | 1 refills | Status: DC

## 2022-07-20 NOTE — Assessment & Plan Note (Signed)
Referral back to podiatry made 

## 2022-07-20 NOTE — Assessment & Plan Note (Signed)
Patient currently off methotrexate and concerned regarding this situation.  Patient is also off Entresto and losartan.  Blood pressure today low normal.  Will need to reassess her renal function and liver function.  The patient's doses of Aldactone and amiodarone need to be clarified.  Patient needs to get back into cardiology on a sooner basis.  Next appointment is scheduled for June.  Will ask for an appointment sooner

## 2022-07-20 NOTE — Assessment & Plan Note (Signed)
Improved; will monitor

## 2022-07-20 NOTE — Assessment & Plan Note (Signed)
Continue proton pump inhibitor

## 2022-07-20 NOTE — Assessment & Plan Note (Signed)
Currently compensated 

## 2022-07-20 NOTE — Assessment & Plan Note (Signed)
As per above assessments 

## 2022-07-20 NOTE — Assessment & Plan Note (Signed)
Currently compensated but need cardiology input

## 2022-07-20 NOTE — Assessment & Plan Note (Signed)
Continue 150 mcg of Synthroid and reassess thyroid function

## 2022-07-20 NOTE — Assessment & Plan Note (Signed)
Resolved BMI now 25

## 2022-07-20 NOTE — Patient Instructions (Addendum)
Complete screening labs will be obtained today including thyroid function  I have attempted to reconcile your medicines on your list  We need to get you back with cardiology as soon as possible because you are off medications they are assuming you are on and the doses have not been adjusted in the chart on the medicines that had changed  Keep your follow-ups with gastroenterology  Hold off on seeing rheumatology until you see cardiology again and hold methotrexate for now until you see the cardiologist I will let them know you been off that since March  Referral to podiatry foot doctor will be made  Return to Dr. Delford Field 4 months  For now stay on the amiodarone one half of the 200 mg tablet daily  No change in the thyroid dose for now continue to take the 150 mcg daily checking thyroid function today

## 2022-07-20 NOTE — Assessment & Plan Note (Signed)
Resolved

## 2022-07-20 NOTE — Assessment & Plan Note (Signed)
Stable at this time 

## 2022-07-20 NOTE — Progress Notes (Signed)
Established Patient Office Visit  Subjective   Patient ID: Sheri Morgan, adult    DOB: December 12, 1945  Age: 77 y.o. MRN: [id number]  Chief Complaint  Patient presents with   Medication Refill   medication question    For synthroid medication she wants to know why she does not take it on Sunday     This is a 77 year old woman with chronic heart failure likely due to sarcoidosis, reduced ejection fraction 30%, recurrent PVC and SVT PM VT status post ICD placement.  Patient recently admitted to [facility] for shock due to diverticulitis such that her medications were adjusted.  She was started on midodrine for a brief period of time now off this.  Her losartan and Entresto have been held.  Her methotrexate has been held as well.  Her thyroid was elevated and her Synthroid dose reduced to 150 mcg 7 days a week.  She last had cardiology visit in the heart failure clinic in December of last year.  She needs a return visit.  She is complaining of toenail fungus again as well.  She is requesting refills on proton pump inhibitor.  Patient then saw PA Mcclung in March.  Documentation is as below as well.  Below is documentation from hospitalization in February of this year.  Sheri Morgan is a 77 y.o. adult here today for a follow up visit After admission 04/27/2022-05/05/2022 for suspected diverticulitis and hypovolemic shock at [facility].  BP meds (entresto and losartan) have not been resumed.  She is still taking midodrine.  BP at home are 120-130/70-80.  She has still been having some nausea and about 1 episode of vomiting per week or less.  She has had 2 episodes of BRB in the toilet.  She has been constipated.  She is just overall not feeling "back to herself."  Documented weight loss from 172 to 157 in Epic.  She says she gets full more easily and appetite is not as good as it used to be.  No fever.  No acute abdominal pain.  She has not had to take lasix or potassium in a  while.  She denies CP/SOB/edema         From [facility] Notes: Hospital Course: For full details, please see H&P, progress notes, consult notes and ancillary notes. Briefly, 77 y.o. female with HFrEF (EF 30-35%) on lasix, cardiac sarcoidosis, recurrent PVCs/NSVT/PMVT s/p ICD, paroxsysmal atrial flutter presenting to the ED for evaluation of abdominal pain, persistent nausea vomiting, admitted to the ICU for vasopressor support. Hospital course will be addressed in a problem based format as below.  #Shock of uncertain etiology, likely hypovolemic versus septic #Suspected diverticulitis #History of HFrEF, cardiac sarcoidosis, PMVT s/p ICD Patient initially presented after several weeks of persistent nausea/vomiting with meals and persistent abdominal pain. She presented with systolic blood pressures in the 60s/70s, and received nearly 4 L of IV fluid resuscitation in the ED, but remained hypotensive and thus was admitted to the ICU and started on Levophed. At one point, she required up to 26 mcg/minute of Levophed. She was started empirically with Zosyn provide days for suspected intra-abdominal source of infection. Her infectious workup was mostly unremarkable, with negative blood cultures, negative urinalysis, negative respiratory symptoms/diarrhea. She had a CT abdomen/pelvis with contrast performed which showed nonspecific enteritis involving descending colon, and potential inflammatory changes along the ascending colon which could represent an early/developing diverticulitis. She also had a repeat TTE performed which showed an EF of 40-45%,  elevated from her prior known EF of 30-35%. To improve her cardiac output while in shock, EP adjusted her pacemaker and increased heart rate from 60 bpm to 80 bpm. The etiology of her shock was felt to be likely hypovolemic versus septic, and unlikely to be cardiogenic or hemorrhagic event absence of decreasing hemoglobin and warm, flushed extremities.  Prior to  transfer from the MICU to the ACE unit, patient had been off IV vasopressors for 24 hours and started on 7.5 mg 3 times daily midodrine, with losartan/Entresto held. Upon transfer to our unit, patient's systolic blood pressure remained around 100/110. EP was reengaged, and EP we adjusted her pacemaker back to 60 bpm on the day of discharge.  #AKI on CKD Patient has a baseline creatinine of 1.4-1.6, with an admission creatinine of 2.33. She received up to 4 L of IV fluids, which initially improved her creatinine. On the day of discharge, her creatinine had returned to around her baseline at 1.62.  On day of discharge, patient is clinically stable with no new examination findings or acute symptoms compared to prior. The patient was seen by the attending physician on the date of discharge and deemed stable and acceptable for discharge. The patient's chronic medical conditions were treated accordingly per the patient's home medication regimen. The patient's medication reconciliation, follow-up appointments, discharge orders, instructions, and significant lab and diagnostic studies are as noted.  Medication changes: 1. Midodrine 7.5 mg 3 times daily prescribed as part of bridging for hypotension, continue at discharge 2. Losartan and Entresto both held on discharge given SBP 100-110 on discharge. 3. Levothyroxine dose reduced from 200 mcg daily to 175 mcg daily given TSH < 0.05 and elevated free T4 4. Continue methotrexate 20 mg weekly  Discharge Follow-up Action Items: 1. Follow-up with PCP in 1-2 weeks (patient has PCP in [place] but is planning on moving to [place]) 2. Reassess blood pressure, unhold Entresto as indicated. Consider removing losartan from home medication list as patient is already on Entresto (sacubitril/valsartan) 3. Recheck TSH and free T4, adjust synthroid dose as necessary 4. Recheck metabolic panel to evaluate for creatinine 5. Gold card to cardiology (patient would like  to establish in [place] as she is moving away from [place]). Pacemaker settings were returned to baseline on day of discharge. 6. On CT abdomen/pelvis, a renal lesion (right upper pole lobulated, intermediate density) was noted (stable since 2016) concerning for slow-growing neoplasm. Consider repeating CT abdomen/pelvis in 6 months-1 year (or sooner based on clinical judgement) for monitoring.   The patient then has had rectal pain and bleeding from the rectum.  Patient is been followed by [facility] health gastroenterology documentation from April as below.  GI 06/2022 1. Change in bowel habits 2. Weight loss 3. Nausea and vomiting, unspecified vomiting type 4. Early satiety 5. Rectal bleeding 6. Constipation, unspecified constipation type 7. Abnormal CT of the abdomen  Plan: Ardena Talamantes is a 77 y.o. female with a past medical history of diverticulitis, colon polyps, CHF, CKD, and A-fib seen today for ED follow-up of rectal pain and bleeding. From PA Mcclung note in March Ms. Dalesio reports today for ED follow-up after recently [facility] due to rectal pain and bleeding. A CT scan revealed mild mural thickening of the distal aspect of the rectum, may reflect nonspecific proctitis. Large colorectal stool burden. Findings suggestive of constipation/possible fecal impaction. No evidence of bowel obstruction. Scattered colonic diverticulosis without evidence of diverticulitis. She reports that around that time  she was having a very hard time moving her bowels and also had some maroon-colored stools due to very large, bulky bowel movements. She reports that this is improved, but now she is seeing thin caliber stools. Her last colonoscopy was in 2009 with findings of diverticulosis. She was placed on a 5-year recall. She is also struggling with nausea, vomiting, early satiety, and weight loss. She has lost about 40 pounds in the last 2 months. Her last upper endoscopy was also  in 2009 and was with erosive esophagitis. With her recent symptoms, I think she needs to be evaluated with both an upper endoscopy and a colonoscopy. She is agreeable. She has a history of ICD so we will plan for this at [facility] later this week. She is also on Eliquis and I have asked her to hold this for 2 days prior to this procedure. We will prep her with Sutab. Further recommendations to follow pending the results of her endoscopic exam.  Patient states she is not currently having rectal bleeding.  She brings with her her medications and the medication list does not correlate with current medications.  She is supposed to be on 150 mcg of Synthroid daily and she is taking this as prescribed.  She is off the losartan and Entresto.  She is taking 25 mg of the Aldactone.  She is taking amiodarone 200 mg dose she is supposed to be on 100 mg.  She is maintaining her Eliquis.  I spent quite a bit of time reconciling her medications and asking her to bring all her medicines in a bag with her for the next visits.  Note also patient supposed to be on Farxiga but has never obtained this medication.      Review of Systems  Constitutional:  Negative for chills, diaphoresis, fever, malaise/fatigue and weight loss.  HENT:  Negative for congestion, hearing loss, nosebleeds, sore throat and tinnitus.   Eyes:  Negative for blurred vision, photophobia and redness.  Respiratory:  Positive for shortness of breath. Negative for cough, hemoptysis, sputum production, wheezing and stridor.   Cardiovascular:  Negative for chest pain, palpitations, orthopnea, claudication, leg swelling and PND.  Gastrointestinal:  Positive for blood in stool and constipation. Negative for abdominal pain, diarrhea, heartburn, nausea and vomiting.  Genitourinary:  Negative for dysuria, flank pain, frequency, hematuria and urgency.  Musculoskeletal:  Negative for back pain, falls, joint pain, myalgias and neck pain.  Skin:  Negative for itching  and rash.  Neurological:  Negative for dizziness, tingling, tremors, sensory change, speech change, focal weakness, seizures, loss of consciousness, weakness and headaches.  Endo/Heme/Allergies:  Negative for environmental allergies and polydipsia. Does not bruise/bleed easily.  Psychiatric/Behavioral:  Negative for depression, memory loss, substance abuse and suicidal ideas. The patient is not nervous/anxious and does not have insomnia.       Objective:     BP 133/81 (BP Location: Right Arm, Patient Position: Sitting, Cuff Size: Normal)   Pulse (!) 57   Wt 147 lb 9.6 oz (67 kg)   SpO2 98%   BMI 25.74 kg/m    Physical Exam Vitals reviewed.  Constitutional:      Appearance: Normal appearance. HEND LUEKER is well-developed. ADAMA COREA is not diaphoretic.  HENT:     Head: Normocephalic and atraumatic.     Nose: No nasal deformity, septal deviation, mucosal edema or rhinorrhea.     Right Sinus: No maxillary sinus tenderness or frontal sinus tenderness.     Left Sinus:  No maxillary sinus tenderness or frontal sinus tenderness.     Mouth/Throat:     Pharynx: No oropharyngeal exudate.  Eyes:     General: No scleral icterus.    Conjunctiva/sclera: Conjunctivae normal.     Pupils: Pupils are equal, round, and reactive to light.  Neck:     Thyroid: No thyromegaly.     Vascular: No carotid bruit or JVD.     Trachea: Trachea normal. No tracheal tenderness or tracheal deviation.  Cardiovascular:     Rate and Rhythm: Normal rate and regular rhythm.     Chest Wall: PMI is not displaced.     Pulses: Normal pulses. No decreased pulses.     Heart sounds: S1 normal and S2 normal. Heart sounds not distant. Murmur heard.     No systolic murmur is present.     No diastolic murmur is present.     No friction rub. No gallop. No S3 or S4 sounds.     Comments: Systolic murmur Pulmonary:     Effort: Pulmonary effort is normal. No tachypnea, accessory muscle usage or respiratory distress.      Breath sounds: No stridor. No decreased breath sounds, wheezing, rhonchi or rales.  Chest:     Chest wall: No tenderness.  Abdominal:     General: Abdomen is flat. Bowel sounds are normal. There is no distension.     Palpations: Abdomen is soft. Abdomen is not rigid.     Tenderness: There is no abdominal tenderness. There is no guarding or rebound.  Musculoskeletal:        General: Normal range of motion.     Cervical back: Normal range of motion and neck supple. No edema, erythema or rigidity. No muscular tenderness. Normal range of motion.  Lymphadenopathy:     Head:     Right side of head: No submental or submandibular adenopathy.     Left side of head: No submental or submandibular adenopathy.     Cervical: No cervical adenopathy.  Skin:    General: Skin is warm and dry.     Coloration: Skin is not pale.     Findings: No rash.     Nails: There is no clubbing.  Neurological:     Mental Status: SIENNAH MAJEWSKI is alert and oriented to person, place, and time.     Sensory: No sensory deficit.  Psychiatric:        Speech: Speech normal.        Behavior: Behavior normal.      No results found for any visits on 07/20/22.    The ASCVD Risk score (Arnett DK, et al., 2019) failed to calculate for the following reasons:   The patient has a prior MI or stroke diagnosis    Assessment & Plan:   Problem List Items Addressed This Visit       Cardiovascular and Mediastinum   Primary hypertension - Primary   Relevant Orders   Comprehensive metabolic panel   CBC with Differential/Platelet   Chronic combined systolic and diastolic heart failure (HCC)    Currently compensated but need cardiology input      Paroxysmal atrial fibrillation    Stable at this time      Cardiac sarcoidosis    Patient currently off methotrexate and concerned regarding this situation.  Patient is also off Entresto and losartan.  Blood pressure today low normal.  Will need to reassess her renal  function and liver function.  The patient's doses of Aldactone  and amiodarone need to be clarified.  Patient needs to get back into cardiology on a sooner basis.  Next appointment is scheduled for June.  Will ask for an appointment sooner      HFrEF (heart failure with reduced ejection fraction) (HCC)    As per above assessments      Acute on chronic combined systolic (congestive) and diastolic (congestive) heart failure (HCC)    Currently compensated      Cardiogenic shock (HCC)    Resolved        Digestive   Esophageal reflux    Continue proton pump inhibitor      Relevant Medications   pantoprazole (PROTONIX) 40 MG tablet     Endocrine   Acquired hypothyroidism    Continue 150 mcg of Synthroid and reassess thyroid function      Relevant Orders   Thyroid Panel With TSH     Nervous and Auditory   Stenosis of cervical spine with myelopathy (HCC)    Improved will monitor        Musculoskeletal and Integument   Ingrown nail of great toe of right foot    Referral back to podiatry made        Genitourinary   Stage 3a chronic kidney disease (HCC)    Reassess renal function        Other   RESOLVED: Severe obesity (BMI 35.0-39.9) with comorbidity (HCC)    Resolved BMI now 25      Other Visit Diagnoses     Onychomycosis       Relevant Orders   Ambulatory referral to Podiatry      45 minutes spent extra time needed assessing multiple problems and assessing complex medication list Return in about 4 months (around 11/20/2022) for htn, heart failure, chronic conditions.    Shan Levans, MD

## 2022-07-20 NOTE — Assessment & Plan Note (Signed)
Reassess renal function 

## 2022-07-21 ENCOUNTER — Other Ambulatory Visit: Payer: Self-pay | Admitting: Critical Care Medicine

## 2022-07-21 DIAGNOSIS — E039 Hypothyroidism, unspecified: Secondary | ICD-10-CM

## 2022-07-21 LAB — CBC WITH DIFFERENTIAL/PLATELET
Basophils Absolute: 0 10*3/uL (ref 0.0–0.2)
Basos: 1 %
EOS (ABSOLUTE): 0.1 10*3/uL (ref 0.0–0.4)
Eos: 1 %
Hematocrit: 39.1 % (ref 34.0–46.6)
Hemoglobin: 12.3 g/dL (ref 11.1–15.9)
Immature Grans (Abs): 0 10*3/uL (ref 0.0–0.1)
Immature Granulocytes: 0 %
Lymphocytes Absolute: 1.4 10*3/uL (ref 0.7–3.1)
Lymphs: 33 %
MCH: 30.9 pg (ref 26.6–33.0)
MCHC: 31.5 g/dL (ref 31.5–35.7)
MCV: 98 fL — ABNORMAL HIGH (ref 79–97)
Monocytes Absolute: 0.6 10*3/uL (ref 0.1–0.9)
Monocytes: 14 %
Neutrophils Absolute: 2.2 10*3/uL (ref 1.4–7.0)
Neutrophils: 51 %
Platelets: 122 10*3/uL — ABNORMAL LOW (ref 150–450)
RBC: 3.98 x10E6/uL (ref 3.77–5.28)
RDW: 14.2 % (ref 11.7–15.4)
WBC: 4.2 10*3/uL (ref 3.4–10.8)

## 2022-07-21 LAB — COMPREHENSIVE METABOLIC PANEL
ALT: 20 IU/L (ref 0–32)
AST: 29 IU/L (ref 0–40)
Albumin/Globulin Ratio: 1.7 (ref 1.2–2.2)
Albumin: 4.5 g/dL (ref 3.8–4.8)
Alkaline Phosphatase: 145 IU/L — ABNORMAL HIGH (ref 44–121)
BUN/Creatinine Ratio: 10 — ABNORMAL LOW (ref 12–28)
BUN: 20 mg/dL (ref 8–27)
Bilirubin Total: 0.3 mg/dL (ref 0.0–1.2)
CO2: 22 mmol/L (ref 20–29)
Calcium: 10.1 mg/dL (ref 8.7–10.3)
Chloride: 101 mmol/L (ref 96–106)
Creatinine, Ser: 1.98 mg/dL — ABNORMAL HIGH (ref 0.57–1.00)
Globulin, Total: 2.6 g/dL (ref 1.5–4.5)
Glucose: 80 mg/dL (ref 70–99)
Potassium: 4.1 mmol/L (ref 3.5–5.2)
Sodium: 141 mmol/L (ref 134–144)
Total Protein: 7.1 g/dL (ref 6.0–8.5)
eGFR: 25 mL/min/{1.73_m2} — ABNORMAL LOW (ref >59)

## 2022-07-21 LAB — THYROID PANEL WITH TSH
Free Thyroxine Index: 4.8 (ref 1.2–4.9)
T3 Uptake Ratio: 36 % (ref 24–39)
T4, Total: 13.3 ug/dL — ABNORMAL HIGH (ref 4.5–12.0)
TSH: 0.244 u[IU]/mL — ABNORMAL LOW (ref 0.450–4.500)

## 2022-07-21 MED ORDER — LEVOTHYROXINE SODIUM 50 MCG PO TABS
100.0000 ug | ORAL_TABLET | Freq: Every day | ORAL | 3 refills | Status: DC
Start: 2022-07-21 — End: 2022-09-28

## 2022-07-21 MED ORDER — LEVOTHYROXINE SODIUM 100 MCG PO TABS
100.0000 ug | ORAL_TABLET | Freq: Every day | ORAL | 3 refills | Status: DC
Start: 2022-07-21 — End: 2022-08-03

## 2022-07-21 NOTE — Progress Notes (Signed)
I called the patient she is aware of results and can get confused and was ok with [place] calling her daughter in law Sheilah Mins at [phone] regarding her thyroid dosing  her thyroid is still too high  I recommend changing to daily: she will reduce her current dose to 2 tab daily and the refill will be a single daily

## 2022-07-22 ENCOUNTER — Telehealth (HOSPITAL_COMMUNITY): Payer: Self-pay | Admitting: Cardiology

## 2022-07-22 ENCOUNTER — Telehealth: Payer: Self-pay

## 2022-07-22 NOTE — Telephone Encounter (Signed)
Per patient daughter in law was called and voicemail was left regarding results

## 2022-07-22 NOTE — Telephone Encounter (Signed)
App follow up 5/21 @ 12 Pt aware via daughter Roe Rutherford

## 2022-07-22 NOTE — Telephone Encounter (Signed)
-----   Message from Storm Frisk, MD sent at 07/21/2022 12:18 PM EDT ----- I called the patient she is aware of results and can get confused and was ok with [place] calling her daughter in law Sheilah Mins at [phone] regarding her thyroid dosing  her thyroid is still too high  I recommend changing to daily: she will reduce her current dose to 2 tab daily and the refill will be a single daily

## 2022-07-22 NOTE — Telephone Encounter (Signed)
-----   Message from Dolores Patty, MD sent at 07/21/2022 11:45 PM EDT ----- Leron Croak - can you help find a slot for her with either me or the APP? Thanks -dan  ----- Message ----- From: Storm Frisk, MD Sent: 07/20/2022   4:52 PM EDT To: Dolores Patty, MD  Dan can either you or one of your APP's see this patient sooner than June.  See my note attached.  She was hospitalized in February and many of her cardiac medicines were adjusted.  She maintains off losartan and Entresto.  She has not taken the methotrexate.  These were all held after she was admitted for diverticulitis and shock.  Also her amiodarone dose has not been reduced.  Thanks for getting her in sooner to sort out many of these medications.  I asked her to bring a bag of medication bottles with her for the next visit.

## 2022-07-27 NOTE — Progress Notes (Signed)
[facility]  NOTE   PCP: Storm Frisk, MD HF Cardiologist: Dr. Gala Romney  HPI: Sheri Morgan is a 77 y.o. obese woman with fibromyalgia, PAF, PVCs, HTN, sarcoid and systolic HF   Previously followed by Dr. Alonza Bogus in [place] Saw Dr. Ashby Dawes in 2/22 with recurrent AF. Echo 5/22 with newly discovered LV dysfunction EF 30-35%.   Admitted 2/23 with CP and SOB. CT negative for dissection. Underwent cath showing EF 20-25%.  30% pLAD and elevated LVEDP ? Possible takotsubo.  Echo 20-25% moderate MR. On way to cath developed VT. Started on amiodarone developed more bradycardia -> PMVT. She had subsequent hypotension and cardiogenic shock requiring DBA gtt. EP consulted and lidocaine gtt started with reduced PVC burden. She developed AFL with RVR and underwent DCCV. Initially successful but then had recurrent AFL. Due to bradycardia and need for amiodarone, underwent placement of dual chamber ICD. Then loaded with IV amio and underwent successful TEE/DCCV. She remained on amio for PVCs and mexiletine started when PVCs not fully suppressed. cMRI suspicious for cardiac sarcoid and started on treatment protocol. GDMT limited by elevated SCr, discharged home, weight 191 lbs.  Cardiac PET 9/23 EF 51% no FDG uptake. Echo 9/23 EF 45%, RV ok.  Follow up 12/23, completed prednisone 9/23. Stable NYHA II, volume mildly up, takes lasix PRN.   Admitted to [facility] 2/24 with diverticulitis and shock. Entresto held, started on midodrine. Echo showed EF 45-50%. Had been off MTX. Discharge weight, 157 lbs.  Today she returns for HF follow up with her daughter in law. Overall feeling fine. She has SOB walking up steps. She has fatigue if she over-does it around the house. Denies palpitations, CP, dizziness, edema, or PND/Orthopnea. Appetite ok. No fever or chills. Weight at home 145 pounds. Off Entresto, stopped MTX during 2/24 hospitalization but never re-started. Has not had to used Lasix recently.  Cardiac  Studies: - Echo (2/24): EF 45-50%, RV ok  - Cardiac PET (9/23): EF 51% no FDG uptake.   - Echo (9/23): EF 45%, grade I DD, RV OK  - Echo (2/23): LVEF of 20-25% with global hypokinesis, mildly enlarged RV with moderately reduced systolic function, mild to moderate MR, and moderately elevated RVSP of 54.0 mmHg.   - LHC (2/23): 30% stenosis of pLAD, elevated LVEDP  - cMRI (2/23): LVEF 11%, RVEF 55%, apical LGE, no LV thrombus, pattern concerning for cardiac sarcoid.  Past Medical History:  Diagnosis Date   Anxiety    Atypical atrial flutter (HCC)    Cardiac sarcoidosis    Chronic combined systolic and diastolic CHF (congestive heart failure) (HCC)    Fibromyalgia    Hypertension    Non-ischemic cardiomyopathy (HCC)    LHC 04/23/2021: 30% stenosis of proximal LAD   Paroxysmal atrial fibrillation (HCC)    Premature atrial contractions    Premature ventricular contraction    S/P ICD (internal cardiac defibrillator) procedure    Tachy-brady syndrome (HCC)    VT (ventricular tachycardia) (HCC)    Current Outpatient Medications  Medication Sig Dispense Refill   amiodarone (PACERONE) 200 MG tablet TAKE 1 TABLET BY MOUTH EVERY DAY (Patient taking differently: Take 100 mg by mouth daily.) 90 tablet 3   calcium citrate (CALCITRATE - DOSED IN MG ELEMENTAL CALCIUM) 950 (200 Ca) MG tablet Take 1 tablet (200 mg of elemental calcium total) by mouth 2 (two) times daily. 30 tablet 6   D3-1000 25 MCG (1000 UT) capsule TAKE 1 CAPSULE BY MOUTH EVERY DAY 90  capsule 0   dapagliflozin propanediol (FARXIGA) 10 MG TABS tablet Take 1 tablet (10 mg total) by mouth daily. 90 tablet 3   ELIQUIS 5 MG TABS tablet TAKE 1 TABLET BY MOUTH TWICE A DAY 60 tablet 1   folic acid (FOLVITE) 1 MG tablet TAKE 1 TABLET BY MOUTH EVERY DAY 90 tablet 1   furosemide (LASIX) 40 MG tablet Take 1 tablet as needed for weight gain > 3 lb in a day or 5 lb in a week, leg edema, shortness of breath 30 tablet 3   levothyroxine  (SYNTHROID) 50 MCG tablet Take 2 tablets (100 mcg total) by mouth daily before breakfast. Monday to Saturdays.  Skip Sunday dose 90 tablet 3   mexiletine (MEXITIL) 200 MG capsule TAKE 1 CAPSULE BY MOUTH TWICE A DAY 180 capsule 1   pantoprazole (PROTONIX) 40 MG tablet Take 1 tablet (40 mg total) by mouth daily. 90 tablet 1   polyethylene glycol powder (GLYCOLAX/MIRALAX) 17 GM/SCOOP powder Take 17 g by mouth 2 (two) times daily as needed. 3350 g 1   potassium chloride SA (KLOR-CON M) 20 MEQ tablet Take 1 tablet (20 mEq total) by mouth when taking furosemide 30 tablet 3   rosuvastatin (CRESTOR) 40 MG tablet TAKE 1 TABLET BY MOUTH EVERY DAY 90 tablet 3   spironolactone (ALDACTONE) 25 MG tablet Take 25 mg by mouth daily.     No current facility-administered medications for this encounter.   Allergies  Allergen Reactions   Codeine Itching   Social History   Socioeconomic History   Marital status: Divorced    Spouse name: Not on file   Number of children: Not on file   Years of education: Not on file   Highest education level: Not on file  Occupational History   Not on file  Tobacco Use   Smoking status: Never   Smokeless tobacco: Never  Vaping Use   Vaping Use: Never used  Substance and Sexual Activity   Alcohol use: No   Drug use: No   Sexual activity: Never  Other Topics Concern   Not on file  Social History Narrative   Retired from [employer]   Social Determinants of Health   Financial Resource Strain: Low Risk  (06/30/2022)   Overall Financial Resource Strain (CARDIA)    Difficulty of Paying Living Expenses: Not hard at all  Food Insecurity: No Food Insecurity (06/30/2022)   Hunger Vital Sign    Worried About Running Out of Food in the Last Year: Never true    Ran Out of Food in the Last Year: Never true  Transportation Needs: No Transportation Needs (06/30/2022)   PRAPARE - Administrator, Civil Service (Medical): No    Lack of Transportation (Non-Medical): No   Physical Activity: Insufficiently Active (06/30/2022)   Exercise Vital Sign    Days of Exercise per Week: 5 days    Minutes of Exercise per Session: 20 min  Stress: No Stress Concern Present (06/30/2022)   [employer] of Occupational Health - Occupational Stress Questionnaire    Feeling of Stress : Not at all  Social Connections: Moderately Integrated (11/13/2020)   Social Connection and Isolation Panel [NHANES]    Frequency of Communication with Friends and Family: More than three times a week    Frequency of Social Gatherings with Friends and Family: Not on file    Attends Religious Services: More than 4 times per year    Active Member of Clubs or Organizations: Yes  Attends Banker Meetings: Never    Marital Status: Divorced  Catering manager Violence: Not At Risk (11/13/2020)   Humiliation, Afraid, Rape, and Kick questionnaire    Fear of Current or Ex-Partner: No    Emotionally Abused: No    Physically Abused: No    Sexually Abused: No   Family History  Problem Relation Age of Onset   Diabetes Mother    Alcoholism Father    BP 122/84   Pulse 61   Wt 67.3 kg (148 lb 6.4 oz)   SpO2 97%   BMI 25.88 kg/m   Wt Readings from Last 3 Encounters:  08/03/22 67.3 kg (148 lb 6.4 oz)  07/20/22 67 kg (147 lb 9.6 oz)  06/30/22 66.2 kg (146 lb)   PHYSICAL EXAM: General:  NAD. No resp difficulty, walked into clinic HEENT: Normal Neck: Supple. JVP 10. Carotids 2+ bilat; no bruits. No lymphadenopathy or thryomegaly appreciated. Cor: PMI nondisplaced. Regular rate & rhythm. No rubs, gallops or murmurs. Lungs: Clear Abdomen: Soft, nontender, nondistended. No hepatosplenomegaly. No bruits or masses. Good bowel sounds. Extremities: No cyanosis, clubbing, rash, edema Neuro: Alert & oriented x 3, cranial nerves grossly intact. Moves all 4 extremities w/o difficulty. Affect pleasant.  ECG (personally reviewed): SR a-paced, 1AVB PR 298 msec  Device interrogation  (personally reviewed): CorVue elevated, no AT/AF, 33% v-pacing, 93% a-pacing,   ASSESSMENT & PLAN: 1.  Chronic systolic HF due to NICM  - Echo (5/22): EF 30-35% - Cath (2/23): minimal CAD. Hs-troponin 204 -> 379 - Echo (2/23): EF 20-25% - cMRI (2/23): EF 11% RVEF 15% LGE pattern concerning for sarcoid. (D/w Dr. Bjorn Pippin)  - Echo (9/23): EF 45% - Cardiac PET (9/23): EF 51% no FDG uptake.  - Echo (2/24): EF 45-50%, RV ok - Stable NYHA II. Volume up a little on exam and by CorVue. GDMT limited by recent hypovolemic shock and need for midodrine. Now off midodrine. - Restart Entresto 24/26 mg bid. - Take Lasix 40 mg daily/20 KCL daily x 3 days, then back to PRN. - Continue spironolactone 25 mg daily.  - Continue Farxiga 10 mg daily. - No b-blocker with bradycardia/long 1AVB. - Labs reviewed from PCP visit 07/20/22 K 4.1, SCr 1.98 - Repeat BMET in 10-14 days.   2. Cardiac sarcoidosis - based on cMRI and clinical course - ACE level normal (52 U/L). CT chest. No extra-cardiac sarcoid. - Finished prednisone in 9/23 - Cardiac PET 9/23 EF 51% no FDG uptake. No extra cardiac sarcoid either  - Will need repeat PET in 3-6 months off prednisone to make sure sarcoid not re-activating off prednisone.  - Will refer her to Dr. Dierdre Forth for f/u. We previously referred but she didn't f/u. Discussed again today and she is agreeable. - She has been off MTX 20 mg x 3 months due to hospitalization. - Continue folic acid 1 mg daily. - Discussed with PharmD, will do abbreviated titration with restarting MTX 15 mg q week x 2 weeks, then 20 mg q week thereafter (2.5 mg tabs).   3. PVCs/NSVT/PMVT - EP following.  - S/p ICD 02/23. No VT on device interrogation  - Continue amio 100 mg daily + mexiletine 200 mg bid for suppression.    4. Paroxysmal Atrial Flutter - S/p DCCV 2/16 -> back in AFL on 05/02/21.  - s/p TEE/DCCV 05/08/21-->NSR - NSR on ECG today. - Continue Eliquis 5 mg bid. No bleeding issues. -  Continue amiodarone. Recent TSH low 0.244, she is on  levothyroxine with dose recently reduced to 100 mcg. - Recent CBC stable with hgb 12.3   5. Thrombocytopenia - Platelets 142>133>92>113>125 - Most recent labs showed Plt 122  Follow up next month with Dr. Gala Romney, as scheduled. Will need to schedule cardiac PET.  Anderson Malta Payson Crumby FNP-BC 12:42 PM

## 2022-08-02 ENCOUNTER — Ambulatory Visit (INDEPENDENT_AMBULATORY_CARE_PROVIDER_SITE_OTHER): Payer: Medicare Other

## 2022-08-02 DIAGNOSIS — I5042 Chronic combined systolic (congestive) and diastolic (congestive) heart failure: Secondary | ICD-10-CM

## 2022-08-02 DIAGNOSIS — I428 Other cardiomyopathies: Secondary | ICD-10-CM

## 2022-08-02 LAB — CUP PACEART REMOTE DEVICE CHECK
Battery Remaining Longevity: 73 mo
Battery Remaining Percentage: 85 %
Battery Voltage: 3.02 V
Brady Statistic AP VP Percent: 32 %
Brady Statistic AP VS Percent: 61 %
Brady Statistic AS VP Percent: 1 %
Brady Statistic AS VS Percent: 6.5 %
Brady Statistic RA Percent Paced: 93 %
Brady Statistic RV Percent Paced: 32 %
Date Time Interrogation Session: 20240520020026
HighPow Impedance: 64 Ohm
HighPow Impedance: 64 Ohm
Implantable Lead Connection Status: 753985
Implantable Lead Connection Status: 753985
Implantable Lead Implant Date: 20230220
Implantable Lead Implant Date: 20230220
Implantable Lead Location: 753859
Implantable Lead Location: 753860
Implantable Lead Model: 7122
Implantable Pulse Generator Implant Date: 20230220
Lead Channel Impedance Value: 450 Ohm
Lead Channel Impedance Value: 530 Ohm
Lead Channel Pacing Threshold Amplitude: 1 V
Lead Channel Pacing Threshold Amplitude: 1.25 V
Lead Channel Pacing Threshold Pulse Width: 0.5 ms
Lead Channel Pacing Threshold Pulse Width: 0.5 ms
Lead Channel Sensing Intrinsic Amplitude: 11.2 mV
Lead Channel Sensing Intrinsic Amplitude: 2 mV
Lead Channel Setting Pacing Amplitude: 2.5 V
Lead Channel Setting Pacing Amplitude: 2.5 V
Lead Channel Setting Pacing Pulse Width: 0.5 ms
Lead Channel Setting Sensing Sensitivity: 0.5 mV
Pulse Gen Serial Number: 8937527
Zone Setting Status: 755011

## 2022-08-03 ENCOUNTER — Telehealth (HOSPITAL_COMMUNITY): Payer: Self-pay | Admitting: Pharmacy Technician

## 2022-08-03 ENCOUNTER — Encounter (HOSPITAL_COMMUNITY): Payer: Self-pay

## 2022-08-03 ENCOUNTER — Ambulatory Visit (HOSPITAL_COMMUNITY)
Admission: RE | Admit: 2022-08-03 | Discharge: 2022-08-03 | Disposition: A | Discharge status: Home / Self Care | Payer: Medicare Other | Source: Ambulatory Visit | Attending: Family Medicine | Admitting: Family Medicine

## 2022-08-03 VITALS — BP 122/84 | HR 61 | Wt 148.4 lb

## 2022-08-03 DIAGNOSIS — D8685 Sarcoid myocarditis: Secondary | ICD-10-CM | POA: Diagnosis present

## 2022-08-03 DIAGNOSIS — I480 Paroxysmal atrial fibrillation: Secondary | ICD-10-CM | POA: Insufficient documentation

## 2022-08-03 DIAGNOSIS — Z79890 Hormone replacement therapy (postmenopausal): Secondary | ICD-10-CM | POA: Insufficient documentation

## 2022-08-03 DIAGNOSIS — I4892 Unspecified atrial flutter: Secondary | ICD-10-CM | POA: Diagnosis not present

## 2022-08-03 DIAGNOSIS — Z7901 Long term (current) use of anticoagulants: Secondary | ICD-10-CM | POA: Insufficient documentation

## 2022-08-03 DIAGNOSIS — I2510 Atherosclerotic heart disease of native coronary artery without angina pectoris: Secondary | ICD-10-CM | POA: Diagnosis not present

## 2022-08-03 DIAGNOSIS — Z95810 Presence of automatic (implantable) cardiac defibrillator: Secondary | ICD-10-CM | POA: Diagnosis not present

## 2022-08-03 DIAGNOSIS — D696 Thrombocytopenia, unspecified: Secondary | ICD-10-CM | POA: Insufficient documentation

## 2022-08-03 DIAGNOSIS — I472 Ventricular tachycardia, unspecified: Secondary | ICD-10-CM | POA: Insufficient documentation

## 2022-08-03 DIAGNOSIS — I110 Hypertensive heart disease with heart failure: Secondary | ICD-10-CM | POA: Diagnosis not present

## 2022-08-03 DIAGNOSIS — I493 Ventricular premature depolarization: Secondary | ICD-10-CM

## 2022-08-03 DIAGNOSIS — R5383 Other fatigue: Secondary | ICD-10-CM | POA: Diagnosis not present

## 2022-08-03 DIAGNOSIS — R0602 Shortness of breath: Secondary | ICD-10-CM | POA: Insufficient documentation

## 2022-08-03 DIAGNOSIS — M797 Fibromyalgia: Secondary | ICD-10-CM | POA: Diagnosis not present

## 2022-08-03 DIAGNOSIS — I5022 Chronic systolic (congestive) heart failure: Secondary | ICD-10-CM | POA: Insufficient documentation

## 2022-08-03 DIAGNOSIS — Z7984 Long term (current) use of oral hypoglycemic drugs: Secondary | ICD-10-CM | POA: Insufficient documentation

## 2022-08-03 DIAGNOSIS — Z79899 Other long term (current) drug therapy: Secondary | ICD-10-CM | POA: Insufficient documentation

## 2022-08-03 DIAGNOSIS — Z833 Family history of diabetes mellitus: Secondary | ICD-10-CM | POA: Insufficient documentation

## 2022-08-03 DIAGNOSIS — I428 Other cardiomyopathies: Secondary | ICD-10-CM | POA: Diagnosis not present

## 2022-08-03 MED ORDER — ENTRESTO 24-26 MG PO TABS: 1.0000 | ORAL_TABLET | Freq: Two times a day (BID) | ORAL | 11 refills | Status: DC

## 2022-08-03 MED ORDER — METHOTREXATE SODIUM 2.5 MG PO TABS: ORAL_TABLET | ORAL | 0 refills | Status: DC

## 2022-08-03 NOTE — Patient Instructions (Addendum)
STOP losartan START Entresto 24/26 mg one tab twice a day START Methotrexate 15 mg (6 tabs) weekly every Wednesday for two weeks, then increase to 20 mg (8 tabs) weekly thereafter  TAKE Lasix and Potassium for three days then resume as needed dosing  Labs needed in 10-14 days  Please be sure to schedule follow up with Dr Starla Link. Address: [address], [place], [place] [address] Phone: [phone]   Your physician recommends that you schedule a follow-up appointment in: 6-8 weeks with Dr Gala Romney   Do the following things EVERYDAY: Weigh yourself in the morning before breakfast. Write it down and keep it in a log. Take your medicines as prescribed Eat low salt foods--Limit salt (sodium) to 2000 mg per day.  Stay as active as you can everyday Limit all fluids for the day to less than 2 liters  At the [facility], you and your health needs are our priority. As part of our continuing mission to provide you with exceptional heart care, we have created designated Provider Care Teams. These Care Teams include your primary Cardiologist (physician) and Advanced Practice Providers (APPs- Physician Assistants and Nurse Practitioners) who all work together to provide you with the care you need, when you need it.   You may see any of the following providers on your designated Care Team at your next follow up: Dr Arvilla Meres Dr Marca Ancona Dr. Marcos Eke, NP Robbie Lis, [place] [facility] [place], [place] Brynda Peon, NP Karle Plumber, PharmD   Please be sure to bring in all your medications bottles to every appointment.    Thank you for choosing [facility]

## 2022-08-03 NOTE — Telephone Encounter (Signed)
Advanced Heart Failure Patient Advocate Encounter  The patient was approved for a [employer] grant that will help cover the cost of Entresto, Spironolactone. Total amount awarded, $10,000. Eligibility, 07/27/22 - 07/26/23.  ID [id number]  BIN [id number]  PCN [id number]  Group [id number]  Patient was provided copy while in clinic. Advised patient of 3% OOP requirement for Eliquis assistance.  Archer Asa, CPhT

## 2022-08-03 NOTE — Progress Notes (Signed)
Medication Samples have been provided to the patient.  Drug name: eliquis       Strength: 5 mg        Qty: 56  LOT: [id number]  Exp.Date: 10/2023  Dosing instructions: ONE TAB TWICE DAILY   The patient has been instructed regarding the correct time, dose, and frequency of taking this medication, including desired effects and most common side effects.   Theresia Bough 12:58 PM 08/03/2022

## 2022-08-04 ENCOUNTER — Ambulatory Visit: Payer: Medicare Other | Admitting: Podiatry

## 2022-08-04 DIAGNOSIS — B351 Tinea unguium: Secondary | ICD-10-CM | POA: Diagnosis not present

## 2022-08-04 DIAGNOSIS — M79674 Pain in right toe(s): Secondary | ICD-10-CM

## 2022-08-04 DIAGNOSIS — I999 Unspecified disorder of circulatory system: Secondary | ICD-10-CM | POA: Diagnosis not present

## 2022-08-04 DIAGNOSIS — M79675 Pain in left toe(s): Secondary | ICD-10-CM

## 2022-08-04 NOTE — Progress Notes (Signed)
Subjective:   Patient ID: Sheri Morgan, adult   DOB: 77 y.o.   MRN: [id number]   HPI Patient presents with thickened painful nailbeds 1-5 both feet with the right hallux being severely ingrown she like to have it removed if possible   ROS      Objective:  Physical Exam  Neurological intact vascular reveals diminished pulses DP PT bilateral with upon questioning's only minimal indications of claudication symptomatology but appears to be some with thick yellow brittle nailbeds 1-5 both feet painful     Assessment:  Chronic mycotic nail infection 1-5 both feet vascular disease with significant ingrown component right big toe     Plan:  H&P reviewed all conditions.  At this point I debrided nailbeds 1-5 both feet I discussed the vascular issues and I am sending for ABIs and if they came out well we could consider nail removal right big toe.  Patient will receive back once we get results

## 2022-08-13 ENCOUNTER — Telehealth (HOSPITAL_COMMUNITY): Payer: Self-pay

## 2022-08-13 ENCOUNTER — Ambulatory Visit (HOSPITAL_COMMUNITY)
Admission: RE | Admit: 2022-08-13 | Discharge: 2022-08-13 | Disposition: A | Discharge status: Home / Self Care | Payer: Medicare Other | Source: Ambulatory Visit | Attending: Internal Medicine | Admitting: Internal Medicine

## 2022-08-13 DIAGNOSIS — I5042 Chronic combined systolic (congestive) and diastolic (congestive) heart failure: Secondary | ICD-10-CM

## 2022-08-13 DIAGNOSIS — D8685 Sarcoid myocarditis: Secondary | ICD-10-CM | POA: Insufficient documentation

## 2022-08-13 LAB — BASIC METABOLIC PANEL
Anion gap: 8 (ref 5–15)
BUN: 23 mg/dL (ref 8–23)
CO2: 24 mmol/L (ref 22–32)
Calcium: 9.7 mg/dL (ref 8.9–10.3)
Chloride: 102 mmol/L (ref 98–111)
Creatinine, Ser: 2.18 mg/dL — ABNORMAL HIGH (ref 0.44–1.00)
GFR, Estimated: 23 mL/min — ABNORMAL LOW (ref >60)
Glucose, Bld: 146 mg/dL — ABNORMAL HIGH (ref 70–99)
Potassium: 4.8 mmol/L (ref 3.5–5.1)
Sodium: 134 mmol/L — ABNORMAL LOW (ref 135–145)

## 2022-08-13 NOTE — Telephone Encounter (Signed)
Labs placed and appointment scheduled. Pt and her daughter-in-law are aware, agreeable, and verbalized understanding.

## 2022-08-13 NOTE — Telephone Encounter (Signed)
-----   Message from Jacklynn Ganong, [place] sent at 08/13/2022  3:58 PM EDT ----- Kidney function mildly decreased after starting Entresto.  Repeat BMET in 2 weeks to follow

## 2022-08-16 ENCOUNTER — Encounter: Payer: Self-pay | Admitting: Family Medicine

## 2022-08-23 NOTE — Progress Notes (Deleted)
Established Patient Office Visit  Subjective   Patient ID: Sheri Morgan, adult    DOB: 1945-08-27  Age: 77 y.o. MRN: [id number]  No chief complaint on file.   07/20/22 This is a 77 year old woman with chronic heart failure likely due to sarcoidosis, reduced ejection fraction 30%, recurrent PVC and SVT PM VT status post ICD placement.  Patient recently admitted to [facility] for shock due to diverticulitis such that her medications were adjusted.  She was started on midodrine for a brief period of time now off this.  Her losartan and Entresto have been held.  Her methotrexate has been held as well.  Her thyroid was elevated and her Synthroid dose reduced to 150 mcg 7 days a week.  She last had cardiology visit in the heart failure clinic in December of last year.  She needs a return visit.  She is complaining of toenail fungus again as well.  She is requesting refills on proton pump inhibitor.  Patient then saw PA Mcclung in March.  Documentation is as below as well.  Below is documentation from hospitalization in February of this year.  Sheri Morgan is a 77 y.o. adult here today for a follow up visit After admission 04/27/2022-05/05/2022 for suspected diverticulitis and hypovolemic shock at [facility].  BP meds (entresto and losartan) have not been resumed.  She is still taking midodrine.  BP at home are 120-130/70-80.  She has still been having some nausea and about 1 episode of vomiting per week or less.  She has had 2 episodes of BRB in the toilet.  She has been constipated.  She is just overall not feeling "back to herself."  Documented weight loss from 172 to 157 in Epic.  She says she gets full more easily and appetite is not as good as it used to be.  No fever.  No acute abdominal pain.  She has not had to take lasix or potassium in a while.  She denies CP/SOB/edema         From [facility]: Hospital Course: For full details, please see H&P, progress notes,  consult notes and ancillary notes. Briefly, 77 y.o. female with HFrEF (EF 30-35%) on lasix, cardiac sarcoidosis, recurrent PVCs/NSVT/PMVT s/p ICD, paroxsysmal atrial flutter presenting to the ED for evaluation of abdominal pain, persistent nausea vomiting, admitted to the ICU for vasopressor support. Hospital course will be addressed in a problem based format as below.  #Shock of uncertain etiology, likely hypovolemic versus septic #Suspected diverticulitis #History of HFrEF, cardiac sarcoidosis, PMVT s/p ICD Patient initially presented after several weeks of persistent nausea/vomiting with meals and persistent abdominal pain. She presented with systolic blood pressures in the 60s/70s, and received nearly 4 L of IV fluid resuscitation in the ED, but remained hypotensive and thus was admitted to the ICU and started on Levophed. At one point, she required up to 26 mcg/minute of Levophed. She was started empirically with Zosyn provide days for suspected intra-abdominal source of infection. Her infectious workup was mostly unremarkable, with negative blood cultures, negative urinalysis, negative respiratory symptoms/diarrhea. She had a CT abdomen/pelvis with contrast performed which showed nonspecific enteritis involving descending colon, and potential inflammatory changes along the ascending colon which could represent an early/developing diverticulitis. She also had a repeat TTE performed which showed an EF of 40-45%, elevated from her prior known EF of 30-35%. To improve her cardiac output while in shock, EP adjusted her pacemaker and increased heart rate from 60 bpm to  80 bpm. The etiology of her shock was felt to be likely hypovolemic versus septic, and unlikely to be cardiogenic or hemorrhagic event absence of decreasing hemoglobin and warm, flushed extremities.  Prior to transfer from the MICU to the ACE unit, patient had been off IV vasopressors for 24 hours and started on 7.5 mg 3 times daily midodrine,  with losartan/Entresto held. Upon transfer to our unit, patient's systolic blood pressure remained around 100/110. EP was reengaged, and EP we adjusted her pacemaker back to 60 bpm on the day of discharge.  #AKI on CKD Patient has a baseline creatinine of 1.4-1.6, with an admission creatinine of 2.33. She received up to 4 L of IV fluids, which initially improved her creatinine. On the day of discharge, her creatinine had returned to around her baseline at 1.62.  On day of discharge, patient is clinically stable with no new examination findings or acute symptoms compared to prior. The patient was seen by the attending physician on the date of discharge and deemed stable and acceptable for discharge. The patient's chronic medical conditions were treated accordingly per the patient's home medication regimen. The patient's medication reconciliation, follow-up appointments, discharge orders, instructions, and significant lab and diagnostic studies are as noted.  Medication changes: 1. Midodrine 7.5 mg 3 times daily prescribed as part of bridging for hypotension, continue at discharge 2. Losartan and Entresto both held on discharge given SBP 100-110 on discharge. 3. Levothyroxine dose reduced from 200 mcg daily to 175 mcg daily given TSH < 0.05 and elevated free T4 4. Continue methotrexate 20 mg weekly  Discharge Follow-up Action Items: 1. Follow-up with PCP in 1-2 weeks (patient has PCP in [place] but is planning on moving to [place]) 2. Reassess blood pressure, unhold Entresto as indicated. Consider removing losartan from home medication list as patient is already on Entresto (sacubitril/valsartan) 3. Recheck TSH and free T4, adjust synthroid dose as necessary 4. Recheck metabolic panel to evaluate for creatinine 5. Gold card to cardiology (patient would like to establish in [place] as she is moving away from [place]). Pacemaker settings were returned to baseline on day of  discharge. 6. On CT abdomen/pelvis, a renal lesion (right upper pole lobulated, intermediate density) was noted (stable since 2016) concerning for slow-growing neoplasm. Consider repeating CT abdomen/pelvis in 6 months-1 year (or sooner based on clinical judgement) for monitoring.   The patient then has had rectal pain and bleeding from the rectum.  Patient is been followed by [facility] gastroenterology documentation from April as below.  GI 06/2022 1. Change in bowel habits 2. Weight loss 3. Nausea and vomiting, unspecified vomiting type 4. Early satiety 5. Rectal bleeding 6. Constipation, unspecified constipation type 7. Abnormal CT of the abdomen  Plan: Kenzie Eitzen is a 77 y.o. female with a past medical history of diverticulitis, colon polyps, CHF, CKD, and A-fib seen today for ED follow-up of rectal pain and bleeding. From PA Mcclung note in March Ms. Riling reports today for ED follow-up after [facility] due to rectal pain and bleeding. A CT scan revealed mild mural thickening of the distal aspect of the rectum, may reflect nonspecific proctitis. Large colorectal stool burden. Findings suggestive of constipation/possible fecal impaction. No evidence of bowel obstruction. Scattered colonic diverticulosis without evidence of diverticulitis. She reports that around that time she was having a very hard time moving her bowels and also had some maroon-colored stools due to very large, bulky bowel movements. She reports that this is  improved, but now she is seeing thin caliber stools. Her last colonoscopy was in 2009 with findings of diverticulosis. She was placed on a 5-year recall. She is also struggling with nausea, vomiting, early satiety, and weight loss. She has lost about 40 pounds in the last 2 months. Her last upper endoscopy was also in 2009 and was with erosive esophagitis. With her recent symptoms, I think she needs to be evaluated with both an upper  endoscopy and a colonoscopy. She is agreeable. She has a history of ICD so we will plan for this at [facility] later this week. She is also on Eliquis and I have asked her to hold this for 2 days prior to this procedure. We will prep her with Sutab. Further recommendations to follow pending the results of her endoscopic exam.  Patient states she is not currently having rectal bleeding.  She brings with her her medications and the medication list does not correlate with current medications.  She is supposed to be on 150 mcg of Synthroid daily and she is taking this as prescribed.  She is off the losartan and Entresto.  She is taking 25 mg of the Aldactone.  She is taking amiodarone 200 mg dose she is supposed to be on 100 mg.  She is maintaining her Eliquis.  I spent quite a bit of time reconciling her medications and asking her to bring all her medicines in a bag with her for the next visits.  Note also patient supposed to be on Farxiga but has never obtained this medication.  08/26/22       Review of Systems  Constitutional:  Negative for chills, diaphoresis, fever, malaise/fatigue and weight loss.  HENT:  Negative for congestion, hearing loss, nosebleeds, sore throat and tinnitus.   Eyes:  Negative for blurred vision, photophobia and redness.  Respiratory:  Positive for shortness of breath. Negative for cough, hemoptysis, sputum production, wheezing and stridor.   Cardiovascular:  Negative for chest pain, palpitations, orthopnea, claudication, leg swelling and PND.  Gastrointestinal:  Positive for blood in stool and constipation. Negative for abdominal pain, diarrhea, heartburn, nausea and vomiting.  Genitourinary:  Negative for dysuria, flank pain, frequency, hematuria and urgency.  Musculoskeletal:  Negative for back pain, falls, joint pain, myalgias and neck pain.  Skin:  Negative for itching and rash.  Neurological:  Negative for dizziness, tingling, tremors, sensory change, speech change, focal  weakness, seizures, loss of consciousness, weakness and headaches.  Endo/Heme/Allergies:  Negative for environmental allergies and polydipsia. Does not bruise/bleed easily.  Psychiatric/Behavioral:  Negative for depression, memory loss, substance abuse and suicidal ideas. The patient is not nervous/anxious and does not have insomnia.       Objective:     There were no vitals taken for this visit.   Physical Exam Vitals reviewed.  Constitutional:      Appearance: Normal appearance. SHAVANA CALDER is well-developed. MILAROSE SAVICH is not diaphoretic.  HENT:     Head: Normocephalic and atraumatic.     Nose: No nasal deformity, septal deviation, mucosal edema or rhinorrhea.     Right Sinus: No maxillary sinus tenderness or frontal sinus tenderness.     Left Sinus: No maxillary sinus tenderness or frontal sinus tenderness.     Mouth/Throat:     Pharynx: No oropharyngeal exudate.  Eyes:     General: No scleral icterus.    Conjunctiva/sclera: Conjunctivae normal.     Pupils: Pupils are equal, round, and reactive to light.  Neck:  Thyroid: No thyromegaly.     Vascular: No carotid bruit or JVD.     Trachea: Trachea normal. No tracheal tenderness or tracheal deviation.  Cardiovascular:     Rate and Rhythm: Normal rate and regular rhythm.     Chest Wall: PMI is not displaced.     Pulses: Normal pulses. No decreased pulses.     Heart sounds: S1 normal and S2 normal. Heart sounds not distant. Murmur heard.     No systolic murmur is present.     No diastolic murmur is present.     No friction rub. No gallop. No S3 or S4 sounds.     Comments: Systolic murmur Pulmonary:     Effort: Pulmonary effort is normal. No tachypnea, accessory muscle usage or respiratory distress.     Breath sounds: No stridor. No decreased breath sounds, wheezing, rhonchi or rales.  Chest:     Chest wall: No tenderness.  Abdominal:     General: Abdomen is flat. Bowel sounds are normal. There is no distension.      Palpations: Abdomen is soft. Abdomen is not rigid.     Tenderness: There is no abdominal tenderness. There is no guarding or rebound.  Musculoskeletal:        General: Normal range of motion.     Cervical back: Normal range of motion and neck supple. No edema, erythema or rigidity. No muscular tenderness. Normal range of motion.  Lymphadenopathy:     Head:     Right side of head: No submental or submandibular adenopathy.     Left side of head: No submental or submandibular adenopathy.     Cervical: No cervical adenopathy.  Skin:    General: Skin is warm and dry.     Coloration: Skin is not pale.     Findings: No rash.     Nails: There is no clubbing.  Neurological:     Mental Status: MAMIE CHESHIER is alert and oriented to person, place, and time.     Sensory: No sensory deficit.  Psychiatric:        Speech: Speech normal.        Behavior: Behavior normal.      No results found for any visits on 08/26/22.    The ASCVD Risk score (Arnett DK, et al., 2019) failed to calculate for the following reasons:   The patient has a prior MI or stroke diagnosis    Assessment & Plan:   Problem List Items Addressed This Visit   None 45 minutes spent extra time needed assessing multiple problems and assessing complex medication list No follow-ups on file.    Shan Levans, MD

## 2022-08-25 ENCOUNTER — Telehealth: Payer: Self-pay | Admitting: Critical Care Medicine

## 2022-08-25 ENCOUNTER — Telehealth (HOSPITAL_COMMUNITY): Payer: Self-pay

## 2022-08-25 ENCOUNTER — Ambulatory Visit (HOSPITAL_COMMUNITY)
Admission: RE | Admit: 2022-08-25 | Discharge: 2022-08-25 | Disposition: A | Discharge status: Home / Self Care | Payer: Medicare Other | Source: Ambulatory Visit | Attending: Internal Medicine | Admitting: Internal Medicine

## 2022-08-25 DIAGNOSIS — I5042 Chronic combined systolic (congestive) and diastolic (congestive) heart failure: Secondary | ICD-10-CM | POA: Insufficient documentation

## 2022-08-25 LAB — BASIC METABOLIC PANEL
Anion gap: 12 (ref 5–15)
BUN: 21 mg/dL (ref 8–23)
CO2: 22 mmol/L (ref 22–32)
Calcium: 9.6 mg/dL (ref 8.9–10.3)
Chloride: 101 mmol/L (ref 98–111)
Creatinine, Ser: 2.49 mg/dL — ABNORMAL HIGH (ref 0.44–1.00)
GFR, Estimated: 19 mL/min — ABNORMAL LOW (ref >60)
Glucose, Bld: 94 mg/dL (ref 70–99)
Potassium: 4.2 mmol/L (ref 3.5–5.1)
Sodium: 135 mmol/L (ref 135–145)

## 2022-08-25 NOTE — Telephone Encounter (Signed)
Spoke with patient . Verified name & DOB    Patient seen for office visit on 07/20/2022. Per AVS patient is to return for 4 month follow-up with PCP. Appointment for 06/13/ 2024 cancelled and given appointment for 11/17/2022.Patient voiced understanding of all discussed .

## 2022-08-25 NOTE — Telephone Encounter (Addendum)
Pt aware, agreeable, and verbalized understanding  Labs scheduled and med list up dated   ----- Message from Jacklynn Ganong, FNP sent at 08/25/2022  4:15 PM EDT ----- Kidney function declining after starting Entresto.  Stop Entresto.  Hold lasix, spiro and Farxiga x 2 days then resume at usual home dose.   Repeat BMET in 1 week

## 2022-08-25 NOTE — Telephone Encounter (Signed)
Patient caregiver Ms. Crawford requests that a nurse call the patient to discuss why the appt on 08/26/22 is needed since the patient was just seen on 07/20/22. Cb# [phone]

## 2022-08-26 ENCOUNTER — Ambulatory Visit: Payer: Medicare Other | Admitting: Critical Care Medicine

## 2022-08-27 NOTE — Progress Notes (Signed)
Remote ICD transmission.   

## 2022-08-30 ENCOUNTER — Ambulatory Visit (HOSPITAL_COMMUNITY)
Admission: RE | Admit: 2022-08-30 | Discharge: 2022-08-30 | Disposition: A | Discharge status: Home / Self Care | Payer: Medicare Other | Source: Ambulatory Visit | Attending: Physician Assistant | Admitting: Physician Assistant

## 2022-08-30 DIAGNOSIS — N2889 Other specified disorders of kidney and ureter: Secondary | ICD-10-CM | POA: Diagnosis present

## 2022-09-01 ENCOUNTER — Ambulatory Visit (HOSPITAL_COMMUNITY)
Admission: RE | Admit: 2022-09-01 | Discharge: 2022-09-01 | Disposition: A | Discharge status: Home / Self Care | Payer: Medicare Other | Source: Ambulatory Visit | Attending: Internal Medicine | Admitting: Internal Medicine

## 2022-09-01 DIAGNOSIS — I5042 Chronic combined systolic (congestive) and diastolic (congestive) heart failure: Secondary | ICD-10-CM | POA: Insufficient documentation

## 2022-09-01 LAB — BASIC METABOLIC PANEL
Anion gap: 8 (ref 5–15)
BUN: 22 mg/dL (ref 8–23)
CO2: 23 mmol/L (ref 22–32)
Calcium: 9.5 mg/dL (ref 8.9–10.3)
Chloride: 107 mmol/L (ref 98–111)
Creatinine, Ser: 2.41 mg/dL — ABNORMAL HIGH (ref 0.44–1.00)
GFR, Estimated: 20 mL/min — ABNORMAL LOW (ref >60)
Glucose, Bld: 86 mg/dL (ref 70–99)
Potassium: 3.7 mmol/L (ref 3.5–5.1)
Sodium: 138 mmol/L (ref 135–145)

## 2022-09-13 ENCOUNTER — Other Ambulatory Visit (HOSPITAL_COMMUNITY): Payer: Self-pay | Admitting: Internal Medicine

## 2022-09-14 ENCOUNTER — Other Ambulatory Visit (HOSPITAL_COMMUNITY): Payer: Self-pay | Admitting: Internal Medicine

## 2022-09-27 NOTE — Progress Notes (Signed)
[facility]  NOTE   PCP: Storm Frisk, MD Rheumatology: Dr. Drenda Freeze HF Cardiologist: Dr. Gala Romney  HPI: Sheri Morgan is a 77 y.o. obese woman with fibromyalgia, PAF, PVCs, HTN, sarcoid and systolic HF   Previously followed by Dr. Alonza Bogus in [place] Saw Dr. Ashby Dawes in 2/22 with recurrent AF. Echo 5/22 with newly discovered LV dysfunction EF 30-35%.   Admitted 2/23 with CP and SOB. CT negative for dissection. Underwent cath showing EF 20-25%.  30% pLAD and elevated LVEDP ? Possible takotsubo.  Echo 20-25% moderate MR. On way to cath developed VT. Started on amiodarone developed more bradycardia -> PMVT. She had subsequent hypotension and cardiogenic shock requiring DBA gtt. EP consulted and lidocaine gtt started with reduced PVC burden. She developed AFL with RVR and underwent DCCV. Initially successful but then had recurrent AFL. Due to bradycardia and need for amiodarone, underwent placement of dual chamber ICD. Then loaded with IV amio and underwent successful TEE/DCCV. She remained on amio for PVCs and mexiletine started when PVCs not fully suppressed. cMRI suspicious for cardiac sarcoid and started on treatment protocol. GDMT limited by elevated SCr, discharged home, weight 191 lbs.  Cardiac PET 9/23 EF 51% no FDG uptake. Echo 9/23 EF 45%, RV ok.  Follow up 12/23, completed prednisone 9/23. Stable NYHA II, volume mildly up, takes lasix PRN.   Admitted to [facility] 2/24 with diverticulitis and shock. Entresto held, started on midodrine. Echo showed EF 45-50%. Had been off MTX. Discharge weight, 157 lbs.  Follow up 5/24, NYHA II and volume up. Entresto 24/26 restarted, instructed to take Lasix 40 mg daily x 3 days then back to PRN. Had been off methotrexate since previous hospitalization (about 3 months). Restart at 15 mg q week x 2 weeks then 20 mg q week thereafter.  Saw Rheumatologist 08/31/22, methotrexate stopped and azathioprine started. Referred to nephrology with declining  renal function.  Today she returns for HF follow up with her daughter. Overall feeling fine. Feels a little bloated and took a lasix yesterday. She is not SOB walking on flat ground or with ADLs. Denies palpitations, abnormal bleeding, CP, dizziness, or PND/Orthopnea. Appetite ok. No fever or chills. Weight at home 145 pounds. Taking all medications.    Cardiac Studies: - Echo (2/24): EF 45-50%, RV ok  - Cardiac PET (9/23): EF 51% no FDG uptake.   - Echo (9/23): EF 45%, grade I DD, RV OK  - Echo (2/23): LVEF of 20-25% with global hypokinesis, mildly enlarged RV with moderately reduced systolic function, mild to moderate MR, and moderately elevated RVSP of 54.0 mmHg.   - LHC (2/23): 30% stenosis of pLAD, elevated LVEDP  - cMRI (2/23): LVEF 11%, RVEF 55%, apical LGE, no LV thrombus, pattern concerning for cardiac sarcoid.  Past Medical History:  Diagnosis Date   Anxiety    Atypical atrial flutter (HCC)    Cardiac sarcoidosis    Chronic combined systolic and diastolic CHF (congestive heart failure) (HCC)    Fibromyalgia    Hypertension    Non-ischemic cardiomyopathy (HCC)    LHC 04/23/2021: 30% stenosis of proximal LAD   Paroxysmal atrial fibrillation (HCC)    Premature atrial contractions    Premature ventricular contraction    S/P ICD (internal cardiac defibrillator) procedure    Tachy-brady syndrome (HCC)    VT (ventricular tachycardia) (HCC)    Current Outpatient Medications  Medication Sig Dispense Refill   amiodarone (PACERONE) 100 MG tablet Take 100 mg by mouth daily.  azaTHIOprine (IMURAN) 50 MG tablet Take 50 mg by mouth daily.     D3-1000 25 MCG (1000 UT) capsule TAKE 1 CAPSULE BY MOUTH EVERY DAY 90 capsule 0   ELIQUIS 5 MG TABS tablet TAKE 1 TABLET BY MOUTH TWICE A DAY 60 tablet 1   FARXIGA 10 MG TABS tablet TAKE 1 TABLET BY MOUTH EVERY DAY 90 tablet 3   folic acid (FOLVITE) 1 MG tablet TAKE 1 TABLET BY MOUTH EVERY DAY 30 tablet 0   furosemide (LASIX) 40 MG tablet  Take 1 tablet as needed for weight gain > 3 lb in a day or 5 lb in a week, leg edema, shortness of breath 30 tablet 3   levothyroxine (SYNTHROID) 50 MCG tablet Patient takes 2 tablets by mouth daily.     mexiletine (MEXITIL) 200 MG capsule TAKE 1 CAPSULE BY MOUTH TWICE A DAY 180 capsule 1   pantoprazole (PROTONIX) 40 MG tablet Take 1 tablet (40 mg total) by mouth daily. 90 tablet 1   polyethylene glycol powder (GLYCOLAX/MIRALAX) 17 GM/SCOOP powder Take 17 g by mouth 2 (two) times daily as needed. 3350 g 1   potassium chloride SA (KLOR-CON M) 20 MEQ tablet Take 1 tablet (20 mEq total) by mouth when taking furosemide 30 tablet 3   rosuvastatin (CRESTOR) 40 MG tablet TAKE 1 TABLET BY MOUTH EVERY DAY 90 tablet 3   spironolactone (ALDACTONE) 25 MG tablet Take 25 mg by mouth daily.     No current facility-administered medications for this encounter.   Allergies  Allergen Reactions   Codeine Itching   Social History   Socioeconomic History   Marital status: Divorced    Spouse name: Not on file   Number of children: Not on file   Years of education: Not on file   Highest education level: Not on file  Occupational History   Not on file  Tobacco Use   Smoking status: Never   Smokeless tobacco: Never  Vaping Use   Vaping status: Never Used  Substance and Sexual Activity   Alcohol use: No   Drug use: No   Sexual activity: Never  Other Topics Concern   Not on file  Social History Narrative   Retired from [employer]   Social Determinants of Health   Financial Resource Strain: Low Risk  (06/30/2022)   Overall Financial Resource Strain (CARDIA)    Difficulty of Paying Living Expenses: Not hard at all  Food Insecurity: No Food Insecurity (06/30/2022)   Hunger Vital Sign    Worried About Running Out of Food in the Last Year: Never true    Ran Out of Food in the Last Year: Never true  Transportation Needs: No Transportation Needs (06/30/2022)   PRAPARE - Administrator, Civil Service  (Medical): No    Lack of Transportation (Non-Medical): No  Physical Activity: Insufficiently Active (06/30/2022)   Exercise Vital Sign    Days of Exercise per Week: 5 days    Minutes of Exercise per Session: 20 min  Stress: No Stress Concern Present (07/26/2022)   Received from [facility], [facility] of Occupational Health - Occupational Stress Questionnaire    Feeling of Stress : Only a little  Social Connections: Unknown (07/24/2021)   Received from [facility], [facility]   Social Network    Social Network: Not on file  Intimate Partner Violence: Low Risk  (04/29/2022)   Received from [facility] visits prior to 05/15/2022.,  [facility] visits prior to 05/15/2022.   Safety    How often does anyone, including family and friends, physically hurt you?: Never    How often does anyone, including family and friends, insult or talk down to you?: Never    How often does anyone, including family and friends, threaten you with harm?: Never    How often does anyone, including family and friends, scream or curse at you?: Never   Family History  Problem Relation Age of Onset   Diabetes Mother    Alcoholism Father    BP 126/74   Pulse 80   Wt 67.8 kg (149 lb 6.4 oz)   SpO2 94%   BMI 26.05 kg/m   Wt Readings from Last 3 Encounters:  09/28/22 67.8 kg (149 lb 6.4 oz)  08/03/22 67.3 kg (148 lb 6.4 oz)  07/20/22 67 kg (147 lb 9.6 oz)   PHYSICAL EXAM: General:  NAD. No resp difficulty, walked into clinic HEENT: Normal Neck: Supple. No JVD. Carotids 2+ bilat; no bruits. No lymphadenopathy or thryomegaly appreciated. Cor: PMI nondisplaced. Regular rate & rhythm. No rubs, gallops or murmurs. Lungs: Clear Abdomen: Soft, nontender, nondistended. No hepatosplenomegaly. No bruits or masses. Good bowel sounds. Extremities: No cyanosis, clubbing, rash, trace pedal edema Neuro: Alert & oriented x 3, cranial nerves grossly intact.  Moves all 4 extremities w/o difficulty. Affect pleasant.  Device interrogation (personally reviewed): CorVue recently with volume overload but now stabilized, no AT/AF, 33% v-pacing, 93% a-pacing,   ASSESSMENT & PLAN: 1.  Chronic systolic HF due to NICM  - Echo (5/22): EF 30-35% - Cath (2/23): minimal CAD. Hs-troponin 204 -> 379 - Echo (2/23): EF 20-25% - cMRI (2/23): EF 11% RVEF 15% LGE pattern concerning for sarcoid. (D/w Dr. Bjorn Pippin)  - Echo (9/23): EF 45% - Cardiac PET (9/23): EF 51% no FDG uptake.  - Echo (2/24): EF 45-50%, RV ok - Stable NYHA II. Volume OK today. GDMT limited by recent hypovolemic shock and need for midodrine. Now off midodrine. - Restart losartan 12.5 mg daily (SCr 1.98-->2.49 on Entresto 24/26) - Continue Lasix PRN - Continue spironolactone 25 mg daily.  - Continue Farxiga 10 mg daily. - No b-blocker with bradycardia/long 1AVB. - Labs today.   2. Cardiac sarcoidosis - based on cMRI and clinical course - ACE level normal (52 U/L). CT chest. No extra-cardiac sarcoid. - Finished prednisone in 9/23 - Cardiac PET 9/23 EF 51% no FDG uptake. No extra cardiac sarcoid either  - Will need repeat PET in 3-6 months off prednisone to make sure sarcoid not re-activating off prednisone.  - Saw Rheum Dr. Drenda Freeze. Methotrexate stopped and Imuran started - Continue folic acid 1 mg daily. - CMET and CBC w/ diff today, will forward to Rheum - Schedule cardiac PET  3. PVCs/NSVT/PMVT - EP following.  - S/p ICD 2/23. No VT on device interrogation  - Continue amio 100 mg daily + mexiletine 200 mg bid for suppression.    4. Paroxysmal Atrial Flutter - S/p DCCV 2/16 -> back in AFL on 05/02/21.  - s/p TEE/DCCV 05/08/21-->NSR - Regular on exam today. - Continue Eliquis 5 mg bid. No bleeding issues. - Continue amiodarone. Recent TSH low 0.244, she is on levothyroxine with dose recently reduced to 100 mcg. - Recent CBC stable with hgb 12.3   5. Thrombocytopenia - Platelets  142>133>92>113>125 - Most recent labs showed Plt 122  6. CKD 3b - Baseline SCr 1.7-1.9 - Continue SGLT2i - Labs today -  She has been referred to [facility]  Follow up in 3 months with Dr. Mickle Plumb FNP-BC 2:49 PM

## 2022-09-28 ENCOUNTER — Encounter (HOSPITAL_COMMUNITY): Payer: Self-pay

## 2022-09-28 ENCOUNTER — Ambulatory Visit (HOSPITAL_COMMUNITY)
Admission: RE | Admit: 2022-09-28 | Discharge: 2022-09-28 | Disposition: A | Discharge status: Home / Self Care | Payer: Medicare Other | Source: Ambulatory Visit | Attending: Family Medicine | Admitting: Family Medicine

## 2022-09-28 VITALS — BP 126/74 | HR 80 | Wt 149.4 lb

## 2022-09-28 DIAGNOSIS — I2510 Atherosclerotic heart disease of native coronary artery without angina pectoris: Secondary | ICD-10-CM | POA: Insufficient documentation

## 2022-09-28 DIAGNOSIS — Z833 Family history of diabetes mellitus: Secondary | ICD-10-CM | POA: Diagnosis not present

## 2022-09-28 DIAGNOSIS — I5022 Chronic systolic (congestive) heart failure: Secondary | ICD-10-CM | POA: Diagnosis not present

## 2022-09-28 DIAGNOSIS — I493 Ventricular premature depolarization: Secondary | ICD-10-CM | POA: Insufficient documentation

## 2022-09-28 DIAGNOSIS — Z79890 Hormone replacement therapy (postmenopausal): Secondary | ICD-10-CM | POA: Diagnosis not present

## 2022-09-28 DIAGNOSIS — Z79899 Other long term (current) drug therapy: Secondary | ICD-10-CM | POA: Insufficient documentation

## 2022-09-28 DIAGNOSIS — I4892 Unspecified atrial flutter: Secondary | ICD-10-CM

## 2022-09-28 DIAGNOSIS — N1832 Chronic kidney disease, stage 3b: Secondary | ICD-10-CM | POA: Insufficient documentation

## 2022-09-28 DIAGNOSIS — M797 Fibromyalgia: Secondary | ICD-10-CM | POA: Diagnosis not present

## 2022-09-28 DIAGNOSIS — D696 Thrombocytopenia, unspecified: Secondary | ICD-10-CM | POA: Insufficient documentation

## 2022-09-28 DIAGNOSIS — Z7984 Long term (current) use of oral hypoglycemic drugs: Secondary | ICD-10-CM | POA: Diagnosis not present

## 2022-09-28 DIAGNOSIS — D8685 Sarcoid myocarditis: Secondary | ICD-10-CM | POA: Insufficient documentation

## 2022-09-28 DIAGNOSIS — I480 Paroxysmal atrial fibrillation: Secondary | ICD-10-CM | POA: Diagnosis not present

## 2022-09-28 DIAGNOSIS — I428 Other cardiomyopathies: Secondary | ICD-10-CM | POA: Insufficient documentation

## 2022-09-28 DIAGNOSIS — I130 Hypertensive heart and chronic kidney disease with heart failure and stage 1 through stage 4 chronic kidney disease, or unspecified chronic kidney disease: Secondary | ICD-10-CM | POA: Diagnosis not present

## 2022-09-28 DIAGNOSIS — Z95810 Presence of automatic (implantable) cardiac defibrillator: Secondary | ICD-10-CM | POA: Diagnosis not present

## 2022-09-28 DIAGNOSIS — Z79631 Long term (current) use of antimetabolite agent: Secondary | ICD-10-CM | POA: Diagnosis not present

## 2022-09-28 DIAGNOSIS — Z7901 Long term (current) use of anticoagulants: Secondary | ICD-10-CM | POA: Diagnosis not present

## 2022-09-28 LAB — COMPREHENSIVE METABOLIC PANEL
ALT: 30 U/L (ref 0–44)
AST: 37 U/L (ref 15–41)
Albumin: 3.8 g/dL (ref 3.5–5.0)
Alkaline Phosphatase: 186 U/L — ABNORMAL HIGH (ref 38–126)
Anion gap: 8 (ref 5–15)
BUN: 19 mg/dL (ref 8–23)
CO2: 25 mmol/L (ref 22–32)
Calcium: 9.6 mg/dL (ref 8.9–10.3)
Chloride: 103 mmol/L (ref 98–111)
Creatinine, Ser: 2.06 mg/dL — ABNORMAL HIGH (ref 0.44–1.00)
GFR, Estimated: 24 mL/min — ABNORMAL LOW (ref >60)
Glucose, Bld: 93 mg/dL (ref 70–99)
Potassium: 4.5 mmol/L (ref 3.5–5.1)
Sodium: 136 mmol/L (ref 135–145)
Total Bilirubin: 0.7 mg/dL (ref 0.3–1.2)
Total Protein: 7.2 g/dL (ref 6.5–8.1)

## 2022-09-28 LAB — CBC WITH DIFFERENTIAL/PLATELET
Abs Immature Granulocytes: 0.02 10*3/uL (ref 0.00–0.07)
Basophils Absolute: 0 10*3/uL (ref 0.0–0.1)
Basophils Relative: 1 %
Eosinophils Absolute: 0.1 10*3/uL (ref 0.0–0.5)
Eosinophils Relative: 2 %
HCT: 37.4 % (ref 36.0–46.0)
Hemoglobin: 12.1 g/dL (ref 12.0–15.0)
Immature Granulocytes: 0 %
Lymphocytes Relative: 29 %
Lymphs Abs: 1.3 10*3/uL (ref 0.7–4.0)
MCH: 30.9 pg (ref 26.0–34.0)
MCHC: 32.4 g/dL (ref 30.0–36.0)
MCV: 95.7 fL (ref 80.0–100.0)
Monocytes Absolute: 0.6 10*3/uL (ref 0.1–1.0)
Monocytes Relative: 14 %
Neutro Abs: 2.4 10*3/uL (ref 1.7–7.7)
Neutrophils Relative %: 54 %
Platelets: 118 10*3/uL — ABNORMAL LOW (ref 150–400)
RBC: 3.91 MIL/uL (ref 3.87–5.11)
RDW: 18.6 % — ABNORMAL HIGH (ref 11.5–15.5)
WBC: 4.5 10*3/uL (ref 4.0–10.5)
nRBC: 0 % (ref 0.0–0.2)

## 2022-09-28 MED ORDER — LOSARTAN POTASSIUM 25 MG PO TABS: 12.5000 mg | ORAL_TABLET | Freq: Every day | ORAL | 3 refills | Status: DC

## 2022-09-28 NOTE — Patient Instructions (Signed)
Thank you for coming in today  If you had labs drawn today, any labs that are abnormal the clinic will call you No news is good news  Medications: RESTART Losartan 12.5 mg 1/2 tablet daily   Follow up appointments: Your physician recommends that you return for lab work in: 2 weeks BMET  Your physician recommends that you schedule a follow-up appointment in:  3 months With Dr. Gala Romney You will receive a reminder letter in the mail a few months in advance. If you don't receive a letter, please call our office to schedule the follow-up appointment.    Do the following things EVERYDAY: Weigh yourself in the morning before breakfast. Write it down and keep it in a log. Take your medicines as prescribed Eat low salt foods--Limit salt (sodium) to 2000 mg per day.  Stay as active as you can everyday Limit all fluids for the day to less than 2 liters   At [facility], you and your health needs are our priority. As part of our continuing mission to provide you with exceptional heart care, we have created designated Provider Care Teams. These Care Teams include your primary Cardiologist (physician) and Advanced Practice Providers (APPs- Physician Assistants and Nurse Practitioners) who all work together to provide you with the care you need, when you need it.   You may see any of the following providers on your designated Care Team at your next follow up: Dr Arvilla Meres Dr Marca Ancona Dr. Marcos Eke, NP Robbie Lis, [place] [facility] [place], [place] Brynda Peon, NP Karle Plumber, PharmD   Please be sure to bring in all your medications bottles to every appointment.    Thank you for choosing  [facility]  If you have any questions or concerns before your next appointment please send [place] a message through [place] or call our office at [phone].    TO LEAVE A MESSAGE FOR THE NURSE SELECT  OPTION 2, PLEASE LEAVE A MESSAGE INCLUDING: YOUR NAME DATE OF BIRTH CALL BACK NUMBER REASON FOR CALL**this is important as we prioritize the call backs  YOU WILL RECEIVE A CALL BACK THE SAME DAY AS LONG AS YOU CALL BEFORE 4:00 PM

## 2022-10-02 ENCOUNTER — Other Ambulatory Visit (HOSPITAL_COMMUNITY): Payer: Self-pay | Admitting: Internal Medicine

## 2022-10-04 ENCOUNTER — Other Ambulatory Visit (HOSPITAL_COMMUNITY): Payer: Self-pay | Admitting: Pharmacist

## 2022-10-04 MED ORDER — APIXABAN 5 MG PO TABS: 5.0000 mg | ORAL_TABLET | Freq: Two times a day (BID) | ORAL | 11 refills | Status: DC

## 2022-10-12 ENCOUNTER — Ambulatory Visit (HOSPITAL_COMMUNITY)
Admission: RE | Admit: 2022-10-12 | Discharge: 2022-10-12 | Disposition: A | Discharge status: Home / Self Care | Payer: Medicare Other | Source: Ambulatory Visit | Attending: Cardiology | Admitting: Cardiology

## 2022-10-12 DIAGNOSIS — I5022 Chronic systolic (congestive) heart failure: Secondary | ICD-10-CM | POA: Insufficient documentation

## 2022-10-12 LAB — BASIC METABOLIC PANEL
Anion gap: 6 (ref 5–15)
BUN: 15 mg/dL (ref 8–23)
CO2: 25 mmol/L (ref 22–32)
Calcium: 9.3 mg/dL (ref 8.9–10.3)
Chloride: 108 mmol/L (ref 98–111)
Creatinine, Ser: 1.78 mg/dL — ABNORMAL HIGH (ref 0.44–1.00)
GFR, Estimated: 29 mL/min — ABNORMAL LOW (ref >60)
Glucose, Bld: 78 mg/dL (ref 70–99)
Potassium: 4.1 mmol/L (ref 3.5–5.1)
Sodium: 139 mmol/L (ref 135–145)

## 2022-11-01 ENCOUNTER — Ambulatory Visit: Payer: Medicare Other

## 2022-11-01 DIAGNOSIS — I428 Other cardiomyopathies: Secondary | ICD-10-CM

## 2022-11-01 LAB — CUP PACEART REMOTE DEVICE CHECK
Battery Remaining Longevity: 67 mo
Battery Remaining Percentage: 83 %
Battery Voltage: 3.01 V
Brady Statistic AP VP Percent: 39 %
Brady Statistic AP VS Percent: 55 %
Brady Statistic AS VP Percent: 1 %
Brady Statistic AS VS Percent: 5.6 %
Brady Statistic RA Percent Paced: 94 %
Brady Statistic RV Percent Paced: 39 %
Date Time Interrogation Session: 20240819024315
HighPow Impedance: 61 Ohm
HighPow Impedance: 61 Ohm
Implantable Lead Connection Status: 753985
Implantable Lead Connection Status: 753985
Implantable Lead Implant Date: 20230220
Implantable Lead Implant Date: 20230220
Implantable Lead Location: 753859
Implantable Lead Location: 753860
Implantable Lead Model: 7122
Implantable Pulse Generator Implant Date: 20230220
Lead Channel Impedance Value: 400 Ohm
Lead Channel Impedance Value: 450 Ohm
Lead Channel Pacing Threshold Amplitude: 1 V
Lead Channel Pacing Threshold Amplitude: 1.25 V
Lead Channel Pacing Threshold Pulse Width: 0.5 ms
Lead Channel Pacing Threshold Pulse Width: 0.5 ms
Lead Channel Sensing Intrinsic Amplitude: 11.2 mV
Lead Channel Sensing Intrinsic Amplitude: 2.2 mV
Lead Channel Setting Pacing Amplitude: 2.5 V
Lead Channel Setting Pacing Amplitude: 2.5 V
Lead Channel Setting Pacing Pulse Width: 0.5 ms
Lead Channel Setting Sensing Sensitivity: 0.5 mV
Pulse Gen Serial Number: 8937527
Zone Setting Status: 755011

## 2022-11-11 NOTE — Progress Notes (Signed)
Remote ICD transmission.   

## 2022-11-17 ENCOUNTER — Encounter: Payer: Self-pay | Admitting: Critical Care Medicine

## 2022-11-17 ENCOUNTER — Ambulatory Visit: Payer: Medicare Other | Attending: Critical Care Medicine | Admitting: Critical Care Medicine

## 2022-11-17 ENCOUNTER — Telehealth: Payer: Self-pay | Admitting: Critical Care Medicine

## 2022-11-17 VITALS — BP 123/84 | HR 60 | Ht 63.0 in | Wt 150.0 lb

## 2022-11-17 DIAGNOSIS — E039 Hypothyroidism, unspecified: Secondary | ICD-10-CM

## 2022-11-17 DIAGNOSIS — D696 Thrombocytopenia, unspecified: Secondary | ICD-10-CM

## 2022-11-17 DIAGNOSIS — Z95810 Presence of automatic (implantable) cardiac defibrillator: Secondary | ICD-10-CM

## 2022-11-17 DIAGNOSIS — E785 Hyperlipidemia, unspecified: Secondary | ICD-10-CM

## 2022-11-17 DIAGNOSIS — N1831 Chronic kidney disease, stage 3a: Secondary | ICD-10-CM

## 2022-11-17 DIAGNOSIS — I10 Essential (primary) hypertension: Secondary | ICD-10-CM | POA: Diagnosis not present

## 2022-11-17 DIAGNOSIS — I480 Paroxysmal atrial fibrillation: Secondary | ICD-10-CM

## 2022-11-17 DIAGNOSIS — F411 Generalized anxiety disorder: Secondary | ICD-10-CM

## 2022-11-17 DIAGNOSIS — R7303 Prediabetes: Secondary | ICD-10-CM

## 2022-11-17 MED ORDER — FUROSEMIDE 40 MG PO TABS: ORAL_TABLET | ORAL | 1 refills | Status: DC

## 2022-11-17 MED ORDER — LEVOTHYROXINE SODIUM 100 MCG PO TABS: 100.0000 ug | ORAL_TABLET | Freq: Every day | ORAL | 1 refills | Status: DC

## 2022-11-17 MED ORDER — POTASSIUM CHLORIDE CRYS ER 20 MEQ PO TBCR: 20.0000 meq | EXTENDED_RELEASE_TABLET | ORAL | 3 refills | Status: DC

## 2022-11-17 NOTE — Assessment & Plan Note (Signed)
As per cardiology 

## 2022-11-17 NOTE — Assessment & Plan Note (Signed)
Recent interrogation shows no activation of the device

## 2022-11-17 NOTE — Assessment & Plan Note (Signed)
She has stable thrombocytopenia will monitor

## 2022-11-17 NOTE — Assessment & Plan Note (Signed)
Continue statin. 

## 2022-11-17 NOTE — Assessment & Plan Note (Signed)
Being followed by nephrology.  

## 2022-11-17 NOTE — Patient Instructions (Signed)
Medications not recently reordered refilled  For thyroid pill take one daily 100 mcg  Keep all follow up appointments Return primary care in 4 months

## 2022-11-17 NOTE — Assessment & Plan Note (Signed)
Has reduced dose of Synthroid down to 100 mcg daily

## 2022-11-17 NOTE — Progress Notes (Signed)
Established Patient Office Visit  Subjective   Patient ID: Sheri Morgan, adult    DOB: 1945-06-25  Age: 77 y.o. MRN: [id number]  Chief Complaint  Patient presents with   Hypertension    HTN f/u. Med refill. Requesting med for depression / anxiety     07/20/22 This is a 77 year old woman with chronic heart failure likely due to sarcoidosis, reduced ejection fraction 30%, recurrent PVC and SVT PM VT status post ICD placement.  Patient recently admitted to [facility] for shock due to diverticulitis such that her medications were adjusted.  She was started on midodrine for a brief period of time now off this.  Her losartan and Entresto have been held.  Her methotrexate has been held as well.  Her thyroid was elevated and her Synthroid dose reduced to 150 mcg 7 days a week.  She last had cardiology visit in the heart failure clinic in December of last year.  She needs a return visit.  She is complaining of toenail fungus again as well.  She is requesting refills on proton pump inhibitor.  Patient then saw PA Mcclung in March.  Documentation is as below as well.  Below is documentation from hospitalization in February of this year.  Sheri Morgan is a 77 y.o. adult here today for a follow up visit After admission 04/27/2022-05/05/2022 for suspected diverticulitis and hypovolemic shock at [facility]  BP meds (entresto and losartan) have not been resumed.  She is still taking midodrine.  BP at home are 120-130/70-80.  She has still been having some nausea and about 1 episode of vomiting per week or less.  She has had 2 episodes of BRB in the toilet.  She has been constipated.  She is just overall not feeling "back to herself."  Documented weight loss from 172 to 157 in Epic.  She says she gets full more easily and appetite is not as good as it used to be.  No fever.  No acute abdominal pain.  She has not had to take lasix or potassium in a while.  She denies CP/SOB/edema          From [facility] Notes: Hospital Course: For full details, please see H&P, progress notes, consult notes and ancillary notes. Briefly, 77 y.o. female with HFrEF (EF 30-35%) on lasix, cardiac sarcoidosis, recurrent PVCs/NSVT/PMVT s/p ICD, paroxsysmal atrial flutter presenting to the ED for evaluation of abdominal pain, persistent nausea vomiting, admitted to the ICU for vasopressor support. Hospital course will be addressed in a problem based format as below.  #Shock of uncertain etiology, likely hypovolemic versus septic #Suspected diverticulitis #History of HFrEF, cardiac sarcoidosis, PMVT s/p ICD Patient initially presented after several weeks of persistent nausea/vomiting with meals and persistent abdominal pain. She presented with systolic blood pressures in the 60s/70s, and received nearly 4 L of IV fluid resuscitation in the ED, but remained hypotensive and thus was admitted to the ICU and started on Levophed. At one point, she required up to 26 mcg/minute of Levophed. She was started empirically with Zosyn provide days for suspected intra-abdominal source of infection. Her infectious workup was mostly unremarkable, with negative blood cultures, negative urinalysis, negative respiratory symptoms/diarrhea. She had a CT abdomen/pelvis with contrast performed which showed nonspecific enteritis involving descending colon, and potential inflammatory changes along the ascending colon which could represent an early/developing diverticulitis. She also had a repeat TTE performed which showed an EF of 40-45%, elevated from her prior known EF of 30-35%. To  improve her cardiac output while in shock, EP adjusted her pacemaker and increased heart rate from 60 bpm to 80 bpm. The etiology of her shock was felt to be likely hypovolemic versus septic, and unlikely to be cardiogenic or hemorrhagic event absence of decreasing hemoglobin and warm, flushed extremities.  Prior to transfer from the MICU to the ACE unit,  patient had been off IV vasopressors for 24 hours and started on 7.5 mg 3 times daily midodrine, with losartan/Entresto held. Upon transfer to our unit, patient's systolic blood pressure remained around 100/110. EP was reengaged, and EP we adjusted her pacemaker back to 60 bpm on the day of discharge.  #AKI on CKD Patient has a baseline creatinine of 1.4-1.6, with an admission creatinine of 2.33. She received up to 4 L of IV fluids, which initially improved her creatinine. On the day of discharge, her creatinine had returned to around her baseline at 1.62.  On day of discharge, patient is clinically stable with no new examination findings or acute symptoms compared to prior. The patient was seen by the attending physician on the date of discharge and deemed stable and acceptable for discharge. The patient's chronic medical conditions were treated accordingly per the patient's home medication regimen. The patient's medication reconciliation, follow-up appointments, discharge orders, instructions, and significant lab and diagnostic studies are as noted.  Medication changes: 1. Midodrine 7.5 mg 3 times daily prescribed as part of bridging for hypotension, continue at discharge 2. Losartan and Entresto both held on discharge given SBP 100-110 on discharge. 3. Levothyroxine dose reduced from 200 mcg daily to 175 mcg daily given TSH < 0.05 and elevated free T4 4. Continue methotrexate 20 mg weekly  Discharge Follow-up Action Items: 1. Follow-up with PCP in 1-2 weeks (patient has PCP in [place] but is planning on moving to [place]) 2. Reassess blood pressure, unhold Entresto as indicated. Consider removing losartan from home medication list as patient is already on Entresto (sacubitril/valsartan) 3. Recheck TSH and free T4, adjust synthroid dose as necessary 4. Recheck metabolic panel to evaluate for creatinine 5. Gold card to cardiology (patient would like to establish in [place] as she is  moving away from [place]). Pacemaker settings were returned to baseline on day of discharge. 6. On CT abdomen/pelvis, a renal lesion (right upper pole lobulated, intermediate density) was noted (stable since 2016) concerning for slow-growing neoplasm. Consider repeating CT abdomen/pelvis in 6 months-1 year (or sooner based on clinical judgement) for monitoring.   The patient then has had rectal pain and bleeding from the rectum.  Patient is been followed by [facility] health gastroenterology documentation from April as below.  GI 06/2022 1. Change in bowel habits 2. Weight loss 3. Nausea and vomiting, unspecified vomiting type 4. Early satiety 5. Rectal bleeding 6. Constipation, unspecified constipation type 7. Abnormal CT of the abdomen  Plan: Sheri Morgan is a 77 y.o. female with a past medical history of diverticulitis, colon polyps, CHF, CKD, and A-fib seen today for ED follow-up of rectal pain and bleeding. From PA Mcclung note in March Ms. Mcduffey reports today for ED follow-up after [facility] due to rectal pain and bleeding. A CT scan revealed mild mural thickening of the distal aspect of the rectum, may reflect nonspecific proctitis. Large colorectal stool burden. Findings suggestive of constipation/possible fecal impaction. No evidence of bowel obstruction. Scattered colonic diverticulosis without evidence of diverticulitis. She reports that around that time she was having a very hard time moving her  bowels and also had some maroon-colored stools due to very large, bulky bowel movements. She reports that this is improved, but now she is seeing thin caliber stools. Her last colonoscopy was in 2009 with findings of diverticulosis. She was placed on a 5-year recall. She is also struggling with nausea, vomiting, early satiety, and weight loss. She has lost about 40 pounds in the last 2 months. Her last upper endoscopy was also in 2009 and was with erosive  esophagitis. With her recent symptoms, I think she needs to be evaluated with both an upper endoscopy and a colonoscopy. She is agreeable. She has a history of ICD so we will plan for this at [facility] later this week. She is also on Eliquis and I have asked her to hold this for 2 days prior to this procedure. We will prep her with Sutab. Further recommendations to follow pending the results of her endoscopic exam.  Patient states she is not currently having rectal bleeding.  She brings with her her medications and the medication list does not correlate with current medications.  She is supposed to be on 150 mcg of Synthroid daily and she is taking this as prescribed.  She is off the losartan and Entresto.  She is taking 25 mg of the Aldactone.  She is taking amiodarone 200 mg dose she is supposed to be on 100 mg.  She is maintaining her Eliquis.  I spent quite a bit of time reconciling her medications and asking her to bring all her medicines in a bag with her for the next visits.  Note also patient supposed to be on Farxiga but has never obtained this medication.  11/17/22 This patient is seen in return follow-up she has a complex history with associated cardiac sarcoidosis she has been followed by rheumatology the heart failure clinic including electrophysiology.  She was last seen in May and today is a follow-up visit.  Below is documentation of her complex history.  She is also followed by nephrology  CHF 09/2022 Chronic systolic HF due to NICM  - Echo (5/22): EF 30-35% - Cath (2/23): minimal CAD. Hs-troponin 204 -> 379 - Echo (2/23): EF 20-25% - cMRI (2/23): EF 11% RVEF 15% LGE pattern concerning for sarcoid. (D/w Dr. Bjorn Pippin)  - Echo (9/23): EF 45% - Cardiac PET (9/23): EF 51% no FDG uptake.  - Echo (2/24): EF 45-50%, RV ok - Stable NYHA II. Volume OK today. GDMT limited by recent hypovolemic shock and need for midodrine. Now off midodrine. - Restart losartan 12.5 mg daily (SCr 1.98-->2.49 on Entresto  24/26) - Continue Lasix PRN - Continue spironolactone 25 mg daily.  - Continue Farxiga 10 mg daily. - No b-blocker with bradycardia/long 1AVB. - Labs today.   2. Cardiac sarcoidosis - based on cMRI and clinical course - ACE level normal (52 U/L). CT chest. No extra-cardiac sarcoid. - Finished prednisone in 9/23 - Cardiac PET 9/23 EF 51% no FDG uptake. No extra cardiac sarcoid either  - Will need repeat PET in 3-6 months off prednisone to make sure sarcoid not re-activating off prednisone.  - Saw Rheum Dr. Drenda Freeze. Methotrexate stopped and Imuran started - Continue folic acid 1 mg daily. - CMET and CBC w/ diff today, will forward to Rheum - Schedule cardiac PET   3. PVCs/NSVT/PMVT - EP following.  - S/p ICD 2/23. No VT on device interrogation  - Continue amio 100 mg daily + mexiletine 200 mg bid for suppression.    4. Paroxysmal Atrial  Flutter - S/p DCCV 2/16 -> back in [facility] on 05/02/21.  - s/p TEE/DCCV 05/08/21-->NSR - Regular on exam today. - Continue Eliquis 5 mg bid. No bleeding issues. - Continue amiodarone. Recent TSH low 0.244, she is on levothyroxine with dose recently reduced to 100 mcg. - Recent CBC stable with hgb 12.3   5. Thrombocytopenia - Platelets 142>133>92>113>125 - Most recent labs showed Plt 122   6. CKD 3b - Baseline SCr 1.7-1.9 - Continue SGLT2i - Labs today - She has been referred to [facility]  Patient now living with her son and daughter-in-law they do not get along very well she has a lot of depression and anxiety.  She is interested in antidepressant however most of these have side effects and conflicts with her cardiac program.  There are no new symptoms no other complaints she does need refills on her noncardiac medications we have been giving her       Review of Systems  Constitutional:  Negative for chills, diaphoresis, fever, malaise/fatigue and weight loss.  HENT:  Negative for congestion, hearing loss, nosebleeds, sore throat and tinnitus.    Eyes:  Negative for blurred vision, photophobia and redness.  Respiratory:  Positive for shortness of breath. Negative for cough, hemoptysis, sputum production, wheezing and stridor.   Cardiovascular:  Negative for chest pain, palpitations, orthopnea, claudication, leg swelling and PND.  Gastrointestinal:  Positive for blood in stool and constipation. Negative for abdominal pain, diarrhea, heartburn, nausea and vomiting.  Genitourinary:  Negative for dysuria, flank pain, frequency, hematuria and urgency.  Musculoskeletal:  Negative for back pain, falls, joint pain, myalgias and neck pain.  Skin:  Negative for itching and rash.  Neurological:  Negative for dizziness, tingling, tremors, sensory change, speech change, focal weakness, seizures, loss of consciousness, weakness and headaches.  Endo/Heme/Allergies:  Negative for environmental allergies and polydipsia. Does not bruise/bleed easily.  Psychiatric/Behavioral:  Negative for depression, memory loss, substance abuse and suicidal ideas. The patient is not nervous/anxious and does not have insomnia.       Objective:     BP 123/84 (BP Location: Left Arm, Patient Position: Sitting, Cuff Size: Normal)   Pulse 60   Ht 5\' 3"  (1.6 m)   Wt 150 lb (68 kg)   SpO2 100%   BMI 26.57 kg/m    Physical Exam Vitals reviewed.  Constitutional:      Appearance: Normal appearance. [place] is well-developed. Audryanna is not diaphoretic.  HENT:     Head: Normocephalic and atraumatic.     Nose: No nasal deformity, septal deviation, mucosal edema or rhinorrhea.     Right Sinus: No maxillary sinus tenderness or frontal sinus tenderness.     Left Sinus: No maxillary sinus tenderness or frontal sinus tenderness.     Mouth/Throat:     Pharynx: No oropharyngeal exudate.  Eyes:     General: No scleral icterus.    Conjunctiva/sclera: Conjunctivae normal.     Pupils: Pupils are equal, round, and reactive to light.  Neck:     Thyroid: No thyromegaly.      Vascular: No carotid bruit or JVD.     Trachea: Trachea normal. No tracheal tenderness or tracheal deviation.  Cardiovascular:     Rate and Rhythm: Normal rate and regular rhythm.     Chest Wall: PMI is not displaced.     Pulses: Normal pulses. No decreased pulses.     Heart sounds: S1 normal and S2 normal. Heart sounds not distant. Murmur heard.  No systolic murmur is present.     No diastolic murmur is present.     No friction rub. No gallop. No S3 or S4 sounds.     Comments: Systolic murmur Pulmonary:     Effort: Pulmonary effort is normal. No tachypnea, accessory muscle usage or respiratory distress.     Breath sounds: No stridor. No decreased breath sounds, wheezing, rhonchi or rales.  Chest:     Chest wall: No tenderness.  Abdominal:     General: Abdomen is flat. Bowel sounds are normal. There is no distension.     Palpations: Abdomen is soft. Abdomen is not rigid.     Tenderness: There is no abdominal tenderness. There is no guarding or rebound.  Musculoskeletal:        General: Normal range of motion.     Cervical back: Normal range of motion and neck supple. No edema, erythema or rigidity. No muscular tenderness. Normal range of motion.  Lymphadenopathy:     Head:     Right side of head: No submental or submandibular adenopathy.     Left side of head: No submental or submandibular adenopathy.     Cervical: No cervical adenopathy.  Skin:    General: Skin is warm and dry.     Coloration: Skin is not pale.     Findings: No rash.     Nails: There is no clubbing.  Neurological:     Mental Status: Ahtziry is alert and oriented to person, place, and time.     Sensory: No sensory deficit.  Psychiatric:        Speech: Speech normal.        Behavior: Behavior normal.      No results found for any visits on 11/17/22.    The ASCVD Risk score (Arnett DK, et al., 2019) failed to calculate for the following reasons:   The patient has a prior MI or stroke diagnosis     Assessment & Plan:   Problem List Items Addressed This Visit       Cardiovascular and Mediastinum   Primary hypertension    Primary hypertension controlled at this time with cardiac program      Relevant Medications   furosemide (LASIX) 40 MG tablet   Paroxysmal atrial fibrillation    As per cardiology      Relevant Medications   furosemide (LASIX) 40 MG tablet     Endocrine   Acquired hypothyroidism    Has reduced dose of Synthroid down to 100 mcg daily      Relevant Medications   levothyroxine (SYNTHROID) 100 MCG tablet     Genitourinary   Stage 3a chronic kidney disease (HCC)    Being followed by nephrology        Hematopoietic and Hemostatic   Thrombocytopenia, unspecified (HCC) - Primary    She has stable thrombocytopenia will monitor        Other   Prediabetes    On Farxiga more for her heart failure      Hyperlipidemia LDL goal <130    Continue statin      Relevant Medications   furosemide (LASIX) 40 MG tablet   ICD (implantable cardioverter-defibrillator) in place    Recent interrogation shows no activation of the device      GAD (generalized anxiety disorder)    Significant anxiety and depression however depression score medication treatment difficult because of cardiac issues will consult with social work per behavioral talk therapy  30 minutes spent extra time needed assessing multiple problems and assessing complex medication list and reviewing multiple specialist records Return in about 4 months (around 03/19/2023) for primary care follow up.    Shan Levans, MD

## 2022-11-17 NOTE — Telephone Encounter (Signed)
Please see for anxiety and depression

## 2022-11-17 NOTE — Assessment & Plan Note (Signed)
Primary hypertension controlled at this time with cardiac program

## 2022-11-17 NOTE — Assessment & Plan Note (Signed)
Significant anxiety and depression however depression score medication treatment difficult because of cardiac issues will consult with social work per behavioral talk therapy

## 2022-11-17 NOTE — Assessment & Plan Note (Signed)
On Farxiga more for her heart failure

## 2022-11-18 NOTE — Telephone Encounter (Signed)
LCSWA called patient today to introduce herself and to assess patients' mental health needs. Patient was referred by PCP for anxiety/depression. Pt states that she is handling them very well. She will knit or play computer games. Does not feel that she needs to see a therapist at this time.

## 2022-11-27 ENCOUNTER — Other Ambulatory Visit: Payer: Self-pay | Admitting: Critical Care Medicine

## 2022-11-27 ENCOUNTER — Other Ambulatory Visit (HOSPITAL_COMMUNITY): Payer: Self-pay | Admitting: Internal Medicine

## 2022-11-29 NOTE — Telephone Encounter (Signed)
Requested Prescriptions  Pending Prescriptions Disp Refills   KLOR-CON M20 20 MEQ tablet [Pharmacy Med Name: KLOR-CON M20 TABLET] 90 tablet 1    Sig: TAKE 1 TABLET (20 MEQ TOTAL) BY MOUTH WHEN TAKING FUROSEMIDE     Endocrinology:  Minerals - Potassium Supplementation Failed - 11/27/2022 11:01 AM      Failed - Cr in normal range and within 360 days    Creatinine, Ser  Date Value Ref Range Status  10/12/2022 1.78 (H) 0.44 - 1.00 mg/dL Final         Passed - K in normal range and within 360 days    Potassium  Date Value Ref Range Status  10/12/2022 4.1 3.5 - 5.1 mmol/L Final         Passed - Valid encounter within last 12 months    Recent Outpatient Visits           1 week ago Primary hypertension   [place] [facility] Storm Frisk, MD   4 months ago HFrEF (heart failure with reduced ejection fraction) [facility])   [place] [facility] Storm Frisk, MD   6 months ago Other specified disorders of kidney and ureter   [facility], Marylene [place], [place]   9 months ago Needs flu shot   [facility] [place], [place], [place]   1 year ago Primary hypertension   [place] [facility] Storm Frisk, MD       Future Appointments             In 3 months Hoy Register, MD [facility]

## 2022-12-02 ENCOUNTER — Other Ambulatory Visit: Payer: Self-pay | Admitting: Critical Care Medicine

## 2022-12-30 ENCOUNTER — Other Ambulatory Visit (HOSPITAL_COMMUNITY): Payer: Self-pay | Admitting: Internal Medicine

## 2023-01-11 ENCOUNTER — Other Ambulatory Visit: Payer: Self-pay | Admitting: Internal Medicine

## 2023-01-31 ENCOUNTER — Ambulatory Visit (INDEPENDENT_AMBULATORY_CARE_PROVIDER_SITE_OTHER): Payer: Medicare Other

## 2023-01-31 DIAGNOSIS — I428 Other cardiomyopathies: Secondary | ICD-10-CM | POA: Diagnosis not present

## 2023-01-31 DIAGNOSIS — I5022 Chronic systolic (congestive) heart failure: Secondary | ICD-10-CM

## 2023-02-01 LAB — CUP PACEART REMOTE DEVICE CHECK
Battery Remaining Longevity: 65 mo
Battery Remaining Percentage: 80 %
Battery Voltage: 3.01 V
Brady Statistic AP VP Percent: 42 %
Brady Statistic AP VS Percent: 52 %
Brady Statistic AS VP Percent: 1 %
Brady Statistic AS VS Percent: 5.5 %
Brady Statistic RA Percent Paced: 94 %
Brady Statistic RV Percent Paced: 42 %
Date Time Interrogation Session: 20241118020016
HighPow Impedance: 63 Ohm
HighPow Impedance: 63 Ohm
Implantable Lead Connection Status: 753985
Implantable Lead Connection Status: 753985
Implantable Lead Implant Date: 20230220
Implantable Lead Implant Date: 20230220
Implantable Lead Location: 753859
Implantable Lead Location: 753860
Implantable Lead Model: 7122
Implantable Pulse Generator Implant Date: 20230220
Lead Channel Impedance Value: 400 Ohm
Lead Channel Impedance Value: 400 Ohm
Lead Channel Pacing Threshold Amplitude: 0.875 V
Lead Channel Pacing Threshold Amplitude: 1.25 V
Lead Channel Pacing Threshold Pulse Width: 0.5 ms
Lead Channel Pacing Threshold Pulse Width: 0.5 ms
Lead Channel Sensing Intrinsic Amplitude: 1.2 mV
Lead Channel Sensing Intrinsic Amplitude: 11.2 mV
Lead Channel Setting Pacing Amplitude: 2.5 V
Lead Channel Setting Pacing Amplitude: 2.5 V
Lead Channel Setting Pacing Pulse Width: 0.5 ms
Lead Channel Setting Sensing Sensitivity: 0.5 mV
Pulse Gen Serial Number: 8937527
Zone Setting Status: 755011

## 2023-02-23 NOTE — Progress Notes (Signed)
Remote ICD transmission.   

## 2023-03-01 ENCOUNTER — Encounter: Payer: Self-pay | Admitting: Critical Care Medicine

## 2023-03-04 ENCOUNTER — Encounter (HOSPITAL_COMMUNITY): Payer: Self-pay | Admitting: *Deleted

## 2023-03-06 ENCOUNTER — Other Ambulatory Visit: Payer: Self-pay | Admitting: Critical Care Medicine

## 2023-03-11 ENCOUNTER — Encounter (HOSPITAL_COMMUNITY): Payer: Self-pay

## 2023-03-24 ENCOUNTER — Ambulatory Visit: Payer: Self-pay | Attending: Family Medicine | Admitting: Family Medicine

## 2023-03-24 ENCOUNTER — Encounter: Payer: Self-pay | Admitting: Family Medicine

## 2023-03-24 VITALS — BP 132/85 | HR 60 | Ht 63.0 in | Wt 158.4 lb

## 2023-03-24 DIAGNOSIS — E039 Hypothyroidism, unspecified: Secondary | ICD-10-CM

## 2023-03-24 DIAGNOSIS — I5022 Chronic systolic (congestive) heart failure: Secondary | ICD-10-CM

## 2023-03-24 DIAGNOSIS — I480 Paroxysmal atrial fibrillation: Secondary | ICD-10-CM

## 2023-03-24 DIAGNOSIS — R058 Other specified cough: Secondary | ICD-10-CM

## 2023-03-24 DIAGNOSIS — Z1159 Encounter for screening for other viral diseases: Secondary | ICD-10-CM

## 2023-03-24 DIAGNOSIS — I10 Essential (primary) hypertension: Secondary | ICD-10-CM

## 2023-03-24 DIAGNOSIS — Z23 Encounter for immunization: Secondary | ICD-10-CM

## 2023-03-24 DIAGNOSIS — Z95811 Presence of heart assist device: Secondary | ICD-10-CM

## 2023-03-24 DIAGNOSIS — R7303 Prediabetes: Secondary | ICD-10-CM

## 2023-03-24 DIAGNOSIS — N1831 Chronic kidney disease, stage 3a: Secondary | ICD-10-CM

## 2023-03-24 MED ORDER — FEXOFENADINE HCL 180 MG PO TABS: 180.0000 mg | ORAL_TABLET | Freq: Every day | ORAL | 1 refills | Status: DC

## 2023-03-24 NOTE — Patient Instructions (Signed)
 VISIT SUMMARY:  During today's visit, we discussed your concerns about mobility issues, a nightly cough, and your blood pressure management. We also reviewed your ongoing conditions, including hypertension, hyperlipidemia, hypothyroidism, prediabetes, chronic kidney disease, cardiac sarcoidosis, and paroxysmal atrial fibrillation. We have made some adjustments to your treatment plan and ordered necessary tests to monitor your health.  YOUR PLAN:  -HYPERTENSION: Hypertension means high blood pressure. Your blood pressure was elevated today, but you had not taken your medication.  Repeat blood pressure measurement returned normal.  Continue your current medication and recheck your blood pressure in 6 months.  -HYPERLIPIDEMIA: Hyperlipidemia means high cholesterol levels. Your last cholesterol check was elevated. We will order a lipid panel and adjust your medication based on the results.  -HYPOTHYROIDISM: Hypothyroidism means your thyroid  is underactive. Your last thyroid  function test was abnormal. We will order a new thyroid  function test and adjust your levothyroxine  dose based on the results.  -PREDIABETES: Prediabetes means your blood sugar levels are higher than normal but not high enough to be diabetes. Your last A1c was 6.0. We will order a new A1c test and continue your current management based on the results.  -CHRONIC KIDNEY DISEASE: Chronic kidney disease means your kidneys are not functioning properly. We will order renal function tests and continue your current management based on the results.  -CARDIAC SARCOIDOSIS AND PAROXYSMAL ATRIAL FIBRILLATION: Cardiac sarcoidosis is an inflammatory disease affecting the heart, and paroxysmal atrial fibrillation is an irregular heart rhythm. You reported no new symptoms, so we will continue your current management.  -CONSTIPATION: Constipation means having difficulty with bowel movements. Continue using Miralax  as needed.  -COUGH: You have been  experiencing a nightly cough with mucus. We will prescribe Allegra  to help manage the secretions causing your cough.  -MOBILITY ISSUES: You are having difficulty walking due to weakness in your knees and ankles. We will complete the handicap form for you.  -GENERAL HEALTH MAINTENANCE: We administered the influenza vaccine today. Follow up in 6 months for a routine check-up.  INSTRUCTIONS:  Please follow up in 6 months for a routine check-up. Make sure to complete the ordered tests, including the lipid panel, thyroid  function test, A1c, and renal function tests, before your next visit.

## 2023-03-24 NOTE — Progress Notes (Signed)
 Subjective:  Patient ID: Sheri Morgan, adult    DOB: 1946-01-01  Age: 78 y.o. MRN: [id number]  CC: Medical Management of Chronic Issues   HPI Sheri Morgan is a 78 y.o. year old adult with a history of hypertension, paroxysmal A-fib, nonischemic cardiomyopathy (status post ICD), cardiac sarcoidosis, hyperlipidemia, anxiety and depression, stage III CKD, thrombocytopenia, Prediabetes, Hypothyroidism..  Interval History: Discussed the use of AI scribe software for clinical note transcription with the patient, who gave verbal consent to proceed.  She presents requesting a handicap sticker due to increasing weakness in her knees and ankles. She reports difficulty walking long distances and has noticed a decline in her mobility.  In addition to her mobility concerns, she has been experiencing a nightly cough, which she describes as productive with mucus. She has been managing this with cough drops, which she reports as being somewhat effective. She expresses interest in a medication to help manage the secretions causing her cough.  The patient also reports that she has not taken her blood pressure medication on the day of the visit, which she attributes to being in a hurry. She typically monitors her blood pressure at home, with readings usually in the range of 130s/85. From a cardiac standpoint she is doing well with no lightheadedness, chest pains, dyspnea or pedal edema and she has an upcoming appointment with cardiology.  She also has a history of hypothyroidism, for which she takes levothyroxine . She reports constipation, which she manages with Miralax .        Past Medical History:  Diagnosis Date   Anxiety    Atypical atrial flutter (HCC)    Cardiac sarcoidosis    Chronic combined systolic and diastolic CHF (congestive heart failure) (HCC)    Fibromyalgia    Hypertension    Non-ischemic cardiomyopathy (HCC)    LHC 04/23/2021: 30% stenosis of proximal LAD   Paroxysmal atrial  fibrillation (HCC)    Premature atrial contractions    Premature ventricular contraction    S/P ICD (internal cardiac defibrillator) procedure    Tachy-brady syndrome (HCC)    VT (ventricular tachycardia) (HCC)     Past Surgical History:  Procedure Laterality Date   ABDOMINAL HYSTERECTOMY     CARDIOVERSION N/A 04/30/2021   Procedure: CARDIOVERSION;  Surgeon: Cherrie Toribio SAUNDERS, MD;  Location: [facility] ENDOSCOPY;  Service: Cardiovascular;  Laterality: N/A;   CARDIOVERSION N/A 05/08/2021   Procedure: CARDIOVERSION;  Surgeon: Cherrie Toribio SAUNDERS, MD;  Location: [facility] ENDOSCOPY;  Service: Cardiovascular;  Laterality: N/A;   ICD IMPLANT N/A 05/04/2021   Procedure: ICD IMPLANT;  Surgeon: Waddell Danelle ORN, MD;  Location: [facility] INVASIVE CV LAB;  Service: Cardiovascular;  Laterality: N/A;   LEFT HEART CATH AND CORONARY ANGIOGRAPHY N/A 04/23/2021   Procedure: LEFT HEART CATH AND CORONARY ANGIOGRAPHY;  Surgeon: Verlin Lonni BIRCH, MD;  Location: [facility] INVASIVE CV LAB;  Service: Cardiovascular;  Laterality: N/A;   LUMBAR LAMINECTOMY     TEE WITHOUT CARDIOVERSION N/A 05/08/2021   Procedure: TRANSESOPHAGEAL ECHOCARDIOGRAM (TEE);  Surgeon: Cherrie Toribio SAUNDERS, MD;  Location: [facility] ENDOSCOPY;  Service: Cardiovascular;  Laterality: N/A;   THYROIDECTOMY      Family History  Problem Relation Age of Onset   Diabetes Mother    Alcoholism Father     Social History   Socioeconomic History   Marital status: Divorced    Spouse name: Not on file   Number of children: Not on file   Years of education: Not on file   Highest education level:  Not on file  Occupational History   Not on file  Tobacco Use   Smoking status: Never   Smokeless tobacco: Never  Vaping Use   Vaping status: Never Used  Substance and Sexual Activity   Alcohol use: No   Drug use: No   Sexual activity: Never  Other Topics Concern   Not on file  Social History Narrative   Retired from [employer]   Social Drivers of Corporate Investment Banker  Strain: Low Risk  (06/30/2022)   Overall Financial Resource Strain (CARDIA)    Difficulty of Paying Living Expenses: Not hard at all  Food Insecurity: No Food Insecurity (06/30/2022)   Hunger Vital Sign    Worried About Running Out of Food in the Last Year: Never true    Ran Out of Food in the Last Year: Never true  Transportation Needs: No Transportation Needs (06/30/2022)   PRAPARE - Administrator, Civil Service (Medical): No    Lack of Transportation (Non-Medical): No  Physical Activity: Insufficiently Active (06/30/2022)   Exercise Vital Sign    Days of Exercise per Week: 5 days    Minutes of Exercise per Session: 20 min  Stress: No Stress Concern Present (07/26/2022)   Received from [facility] - Occupational Stress Questionnaire    Feeling of Stress : Only a little  Social Connections: Unknown (07/24/2021)   Received from [facility]   Social Network    Social Network: Not on file    Allergies  Allergen Reactions   Codeine Itching    Outpatient Medications Prior to Visit  Medication Sig Dispense Refill   amiodarone  (PACERONE ) 100 MG tablet Take 100 mg by mouth daily.     apixaban  (ELIQUIS ) 5 MG TABS tablet Take 1 tablet (5 mg total) by mouth 2 (two) times daily. 60 tablet 11   azaTHIOprine (IMURAN) 50 MG tablet Take 50 mg by mouth daily.     [employer] D3 25 MCG (1000 UT) capsule TAKE 1 CAPSULE BY MOUTH EVERY DAY 90 capsule 0   FARXIGA  10 MG TABS tablet TAKE 1 TABLET BY MOUTH EVERY DAY 90 tablet 3   folic acid  (FOLVITE ) 1 MG tablet TAKE 1 TABLET BY MOUTH EVERY DAY 90 tablet 3   furosemide  (LASIX ) 40 MG tablet TAKE 1 TABLET AS NEEDED FOR WEIGHT GAIN > 3 LB IN A DAY OR 5 LB IN A WEEK, LEG EDEMA, SHORTNESS OF BREATH 270 tablet 1   levothyroxine  (SYNTHROID ) 100 MCG tablet Take 1 tablet (100 mcg total) by mouth daily before breakfast. 90 tablet 1   mexiletine (MEXITIL ) 200 MG capsule TAKE 1 CAPSULE BY  MOUTH TWICE A DAY 180 capsule 1   pantoprazole  (PROTONIX ) 40 MG tablet TAKE 1 TABLET BY MOUTH EVERY DAY 30 tablet 0   polyethylene glycol powder (GLYCOLAX /MIRALAX ) 17 GM/SCOOP powder Take 17 g by mouth 2 (two) times daily as needed. 3350 g 1   potassium chloride  SA (KLOR-CON  M20) 20 MEQ tablet TAKE 1 TABLET (20 MEQ TOTAL) BY MOUTH WHEN TAKING FUROSEMIDE  90 tablet 0   rosuvastatin  (CRESTOR ) 40 MG tablet TAKE 1 TABLET BY MOUTH EVERY DAY 90 tablet 3   spironolactone  (ALDACTONE ) 25 MG tablet Take 25 mg by mouth daily.     losartan  (COZAAR ) 25 MG tablet Take 0.5 tablets (12.5 mg total) by mouth daily. 45 tablet 3   No facility-administered medications prior to visit.     ROS  Review of Systems  Constitutional:  Negative for activity change and appetite change.  HENT:  Negative for sinus pressure and sore throat.   Respiratory:  Positive for cough. Negative for chest tightness, shortness of breath and wheezing.   Cardiovascular:  Negative for chest pain and palpitations.  Gastrointestinal:  Negative for abdominal distention, abdominal pain and constipation.  Genitourinary: Negative.   Musculoskeletal: Negative.   Neurological:  Positive for weakness.  Psychiatric/Behavioral:  Negative for behavioral problems and dysphoric mood.     Objective:  BP 132/85   Pulse 60   Ht 5' 3 (1.6 m)   Wt 158 lb 6.4 oz (71.8 kg)   SpO2 99%   BMI 28.06 kg/m      03/24/2023   12:24 PM 03/24/2023   11:48 AM 11/17/2022   11:04 AM  BP/Weight  Systolic BP 132 161 123  Diastolic BP 85 78 84  Wt. (Lbs)  158.4 150  BMI  28.06 kg/m2 26.57 kg/m2      Physical Exam Constitutional:      Appearance: Sheri Morgan is well-developed.  HENT:     Right Ear: Tympanic membrane normal.     Left Ear: Tympanic membrane normal.     Mouth/Throat:     Mouth: Mucous membranes are moist.  Cardiovascular:     Rate and Rhythm: Normal rate.     Heart sounds: Normal heart sounds. No murmur heard. Pulmonary:     Effort:  Pulmonary effort is normal.     Breath sounds: Normal breath sounds. No wheezing or rales.  Chest:     Chest wall: No tenderness.  Abdominal:     General: Bowel sounds are normal. There is no distension.     Palpations: Abdomen is soft. There is no mass.     Tenderness: There is no abdominal tenderness.  Musculoskeletal:        General: Normal range of motion.     Right lower leg: No edema.     Left lower leg: No edema.  Neurological:     Mental Status: Sheri Morgan is alert and oriented to person, place, and time.  Psychiatric:        Mood and Affect: Mood normal.        Latest Ref Rng & Units 10/12/2022    2:20 PM 09/28/2022    2:49 PM 09/01/2022    1:40 PM  CMP  Glucose 70 - 99 mg/dL 78  93  86   BUN 8 - 23 mg/dL 15  19  22    Creatinine 0.44 - 1.00 mg/dL 8.21  7.93  7.58   Sodium 135 - 145 mmol/L 139  136  138   Potassium 3.5 - 5.1 mmol/L 4.1  4.5  3.7   Chloride 98 - 111 mmol/L 108  103  107   CO2 22 - 32 mmol/L 25  25  23    Calcium  8.9 - 10.3 mg/dL 9.3  9.6  9.5   Total Protein 6.5 - 8.1 g/dL  7.2    Total Bilirubin 0.3 - 1.2 mg/dL  0.7    Alkaline Phos 38 - 126 U/L  186    AST 15 - 41 U/L  37    ALT 0 - 44 U/L  30      Lipid Panel     Component Value Date/Time   CHOL 253 (H) 04/23/2021 2146   TRIG 53 04/23/2021 2146   HDL 101 04/23/2021 2146   CHOLHDL 2.5 04/23/2021 2146   VLDL 11 04/23/2021  2146   LDLCALC 141 (H) 04/23/2021 2146    CBC    Component Value Date/Time   WBC 4.5 09/28/2022 1449   RBC 3.91 09/28/2022 1449   HGB 12.1 09/28/2022 1449   HGB 12.3 07/20/2022 1442   HCT 37.4 09/28/2022 1449   HCT 39.1 07/20/2022 1442   PLT 118 (L) 09/28/2022 1449   PLT 122 (L) 07/20/2022 1442   MCV 95.7 09/28/2022 1449   MCV 98 (H) 07/20/2022 1442   MCH 30.9 09/28/2022 1449   MCHC 32.4 09/28/2022 1449   RDW 18.6 (H) 09/28/2022 1449   RDW 14.2 07/20/2022 1442   LYMPHSABS 1.3 09/28/2022 1449   LYMPHSABS 1.4 07/20/2022 1442   MONOABS 0.6 09/28/2022 1449   EOSABS  0.1 09/28/2022 1449   EOSABS 0.1 07/20/2022 1442   BASOSABS 0.0 09/28/2022 1449   BASOSABS 0.0 07/20/2022 1442    Lab Results  Component Value Date   HGBA1C 6.0 (H) 04/23/2021    Lab Results  Component Value Date   TSH 0.244 (L) 07/20/2022    Assessment & Plan:      Hypertension Elevated blood pressure at the visit, but patient had not taken her medication that morning. No symptoms of heart failure reported. -Continue current regimen. -Recheck blood pressure at the end of the visit returned normal -Continue current regimen  Hyperlipidemia Last cholesterol check in 2023 was elevated. -Order lipid panel. -Adjust medication based on results.  Hypothyroidism Last thyroid  function test in May was abnormal with suppressed TSH -Order thyroid  function test. -Adjust levothyroxine  dose based on results.  Prediabetes Last A1c in 2023 was 6.0. -Order A1c. -Continue current management based on results.  Chronic Kidney Disease stage III Ongoing abnormal kidney function. -Order renal function tests. -Continue current management based on results.  Cardiac Sarcoidosis and Paroxysmal Atrial Fibrillation No new symptoms reported. -Currently in sinus rhythm -Continue current management. -Currently on amiodarone  and Eliquis   Constipation Patient reports constipation and is currently using Miralax . -Continue Miralax  as needed.  Cough Patient reports nocturnal cough with mucus in the back of the throat. -Prescribe Allegra  to help with secretions.  Mobility Issues Patient reports difficulty walking due to knee and ankle weakness. -Complete handicap form for patient.  General Health Maintenance -Administer influenza vaccine today. -Follow-up in 6 months.          Meds ordered this encounter  Medications   fexofenadine  (ALLEGRA  ALLERGY) 180 MG tablet    Sig: Take 1 tablet (180 mg total) by mouth daily.    Dispense:  30 tablet    Refill:  1    Follow-up: No  follow-ups on file.       Corrina Sabin, MD, FAAFP. [facility] [place], [place] [phone]   03/24/2023, 1:52 PM

## 2023-03-25 ENCOUNTER — Other Ambulatory Visit: Payer: Self-pay | Admitting: Family Medicine

## 2023-03-25 ENCOUNTER — Other Ambulatory Visit (HOSPITAL_COMMUNITY): Payer: Self-pay | Admitting: Internal Medicine

## 2023-03-25 ENCOUNTER — Other Ambulatory Visit: Payer: Self-pay | Admitting: Critical Care Medicine

## 2023-03-25 DIAGNOSIS — E039 Hypothyroidism, unspecified: Secondary | ICD-10-CM

## 2023-03-25 LAB — CMP14+EGFR
ALT: 13 [IU]/L (ref 0–32)
AST: 25 [IU]/L (ref 0–40)
Albumin: 4.3 g/dL (ref 3.8–4.8)
Alkaline Phosphatase: 135 [IU]/L — ABNORMAL HIGH (ref 44–121)
BUN/Creatinine Ratio: 14 (ref 12–28)
BUN: 22 mg/dL (ref 8–27)
Bilirubin Total: 0.3 mg/dL (ref 0.0–1.2)
CO2: 23 mmol/L (ref 20–29)
Calcium: 9.4 mg/dL (ref 8.7–10.3)
Chloride: 105 mmol/L (ref 96–106)
Creatinine, Ser: 1.59 mg/dL — ABNORMAL HIGH (ref 0.57–1.00)
Globulin, Total: 2.3 g/dL (ref 1.5–4.5)
Glucose: 85 mg/dL (ref 70–99)
Potassium: 4.3 mmol/L (ref 3.5–5.2)
Sodium: 143 mmol/L (ref 134–144)
Total Protein: 6.6 g/dL (ref 6.0–8.5)
eGFR: 33 mL/min/{1.73_m2} — ABNORMAL LOW (ref >59)

## 2023-03-25 LAB — LP+NON-HDL CHOLESTEROL
Cholesterol, Total: 226 mg/dL — ABNORMAL HIGH (ref 100–199)
HDL: 127 mg/dL (ref >39)
LDL Chol Calc (NIH): 87 mg/dL (ref 0–99)
Total Non-HDL-Chol (LDL+VLDL): 99 mg/dL (ref 0–129)
Triglycerides: 70 mg/dL (ref 0–149)
VLDL Cholesterol Cal: 12 mg/dL (ref 5–40)

## 2023-03-25 LAB — HEMOGLOBIN A1C
Est. average glucose Bld gHb Est-mCnc: 117 mg/dL
Hgb A1c MFr Bld: 5.7 % — ABNORMAL HIGH (ref 4.8–5.6)

## 2023-03-25 LAB — HCV AB W REFLEX TO QUANT PCR: HCV Ab: NONREACTIVE

## 2023-03-25 LAB — T4, FREE: Free T4: 1.16 ng/dL (ref 0.82–1.77)

## 2023-03-25 LAB — TSH: TSH: 23.1 u[IU]/mL — ABNORMAL HIGH (ref 0.450–4.500)

## 2023-03-25 LAB — HCV INTERPRETATION

## 2023-03-25 LAB — T3: T3, Total: 58 ng/dL — ABNORMAL LOW (ref 71–180)

## 2023-03-25 MED ORDER — LEVOTHYROXINE SODIUM 112 MCG PO TABS: 112.0000 ug | ORAL_TABLET | Freq: Every day | ORAL | 3 refills | Status: DC

## 2023-03-25 MED ORDER — EZETIMIBE 10 MG PO TABS: 10.0000 mg | ORAL_TABLET | Freq: Every day | ORAL | 1 refills | Status: DC

## 2023-03-28 ENCOUNTER — Telehealth (HOSPITAL_COMMUNITY): Payer: Self-pay | Admitting: *Deleted

## 2023-03-28 NOTE — Telephone Encounter (Signed)
 Reaching out to patient to offer assistance regarding upcoming cardiac imaging study; pt verbalizes understanding of appt date/time, parking situation and where to check in, pre-test NPO status; name and call back number provided for further questions should they arise  Chantal Requena RN Navigator Cardiac Imaging [facility] [phone] office [phone] cell  Patient verbalized understanding of diet prep and is aware that her study may be canceled due to lack of insurance authorization.

## 2023-03-30 ENCOUNTER — Telehealth (HOSPITAL_COMMUNITY): Payer: Self-pay | Admitting: Pharmacy Technician

## 2023-03-30 ENCOUNTER — Encounter (HOSPITAL_COMMUNITY)
Admission: RE | Admit: 2023-03-30 | Discharge: 2023-03-30 | Disposition: A | Discharge status: Home / Self Care | Payer: Medicare Other | Source: Ambulatory Visit | Attending: Internal Medicine | Admitting: Internal Medicine

## 2023-03-30 DIAGNOSIS — I5022 Chronic systolic (congestive) heart failure: Secondary | ICD-10-CM | POA: Diagnosis present

## 2023-03-30 LAB — NM PET CT MYOCARDIAL SARCOIDOSIS
LV dias vol: 118 mL (ref 46–106)
Nuc Stress EF: 41 %
Rest Nuclear Isotope Dose: 18.8 mCi

## 2023-03-30 MED ORDER — RUBIDIUM RB82 GENERATOR (RUBYFILL)
19.0000 | PACK | Freq: Once | INTRAVENOUS | Status: AC
  Administered 2023-03-30: 18.8 via INTRAVENOUS

## 2023-03-30 MED ORDER — FLUDEOXYGLUCOSE F - 18 (FDG) INJECTION
9.3000 | Freq: Once | INTRAVENOUS | Status: AC
  Administered 2023-03-30: 9.3 via INTRAVENOUS

## 2023-03-30 NOTE — Telephone Encounter (Signed)
 Advanced [facility] Failure Patient Advocate Encounter  Received message that patient needed assistance with medication cost. Called and left the patient a message.   Correne Dillon, CPhT

## 2023-04-14 ENCOUNTER — Encounter (HOSPITAL_COMMUNITY): Payer: Self-pay

## 2023-04-16 ENCOUNTER — Other Ambulatory Visit: Payer: Self-pay | Admitting: Family Medicine

## 2023-04-19 ENCOUNTER — Telehealth (HOSPITAL_COMMUNITY): Payer: Self-pay | Admitting: Cardiology

## 2023-04-19 NOTE — Telephone Encounter (Signed)
Pt aware of pet results

## 2023-04-20 ENCOUNTER — Other Ambulatory Visit (HOSPITAL_COMMUNITY): Payer: Self-pay

## 2023-04-20 ENCOUNTER — Telehealth (HOSPITAL_COMMUNITY): Payer: Self-pay

## 2023-04-20 NOTE — Telephone Encounter (Signed)
 Advanced Heart Failure Patient Advocate Encounter  Returned patient call regarding cost of Farxiga , Eliquis . Patient does have a grant that is active and should cover the cost of Farxiga  down to $0. Eliquis  assistance would require 3% of income to be spent on medication for 2025, and patient does not qualify for that at this time.  Advised pt to have pharmacy bill Farxiga  to the active grant, and request pharmacy to process 30 day supply Eliquis  to see if that is more affordable to the patient, and if not, then patient should look into MPPP.  Rachel DEL, CPhT Rx Patient Advocate Phone: [phone]

## 2023-05-02 ENCOUNTER — Ambulatory Visit (INDEPENDENT_AMBULATORY_CARE_PROVIDER_SITE_OTHER): Payer: Medicare Other

## 2023-05-02 DIAGNOSIS — I428 Other cardiomyopathies: Secondary | ICD-10-CM | POA: Diagnosis not present

## 2023-05-02 DIAGNOSIS — I5022 Chronic systolic (congestive) heart failure: Secondary | ICD-10-CM

## 2023-05-03 LAB — CUP PACEART REMOTE DEVICE CHECK
Battery Remaining Longevity: 62 mo
Battery Remaining Percentage: 77 %
Battery Voltage: 2.99 V
Brady Statistic AP VP Percent: 42 %
Brady Statistic AP VS Percent: 53 %
Brady Statistic AS VP Percent: 1 %
Brady Statistic AS VS Percent: 4.8 %
Brady Statistic RA Percent Paced: 95 %
Brady Statistic RV Percent Paced: 42 %
Date Time Interrogation Session: 20250217020017
HighPow Impedance: 64 Ohm
HighPow Impedance: 64 Ohm
Implantable Lead Connection Status: 753985
Implantable Lead Connection Status: 753985
Implantable Lead Implant Date: 20230220
Implantable Lead Implant Date: 20230220
Implantable Lead Location: 753859
Implantable Lead Location: 753860
Implantable Lead Model: 7122
Implantable Pulse Generator Implant Date: 20230220
Lead Channel Impedance Value: 400 Ohm
Lead Channel Impedance Value: 400 Ohm
Lead Channel Pacing Threshold Amplitude: 0.75 V
Lead Channel Pacing Threshold Amplitude: 1.25 V
Lead Channel Pacing Threshold Pulse Width: 0.5 ms
Lead Channel Pacing Threshold Pulse Width: 0.5 ms
Lead Channel Sensing Intrinsic Amplitude: 1.2 mV
Lead Channel Sensing Intrinsic Amplitude: 11.2 mV
Lead Channel Setting Pacing Amplitude: 2.5 V
Lead Channel Setting Pacing Amplitude: 2.5 V
Lead Channel Setting Pacing Pulse Width: 0.5 ms
Lead Channel Setting Sensing Sensitivity: 0.5 mV
Pulse Gen Serial Number: 8937527
Zone Setting Status: 755011

## 2023-06-01 ENCOUNTER — Other Ambulatory Visit: Payer: Self-pay | Admitting: Family Medicine

## 2023-06-07 NOTE — Progress Notes (Signed)
 Remote ICD transmission.

## 2023-06-07 NOTE — Addendum Note (Signed)
 Addended by: Geralyn Flash D on: 06/07/2023 01:34 PM   Modules accepted: Orders

## 2023-06-10 ENCOUNTER — Telehealth (HOSPITAL_COMMUNITY): Payer: Self-pay | Admitting: Internal Medicine

## 2023-06-10 NOTE — Telephone Encounter (Signed)
 Called to confirm/remind patient of their appointment at the [facility] on 06/10/23.   Appointment:   [] Confirmed  [x] Left mess   [] No answer/No voice mail  [] Phone not in service  Patient reminded to bring all medications and/or complete list.  Confirmed patient has transportation. Gave directions, instructed to utilize valet parking.

## 2023-06-13 ENCOUNTER — Encounter (HOSPITAL_COMMUNITY): Payer: Medicare Other | Admitting: Internal Medicine

## 2023-06-24 ENCOUNTER — Other Ambulatory Visit: Payer: Self-pay | Admitting: Family Medicine

## 2023-06-24 ENCOUNTER — Other Ambulatory Visit: Payer: Self-pay | Admitting: Rheumatology

## 2023-06-24 DIAGNOSIS — R7989 Other specified abnormal findings of blood chemistry: Secondary | ICD-10-CM

## 2023-07-01 ENCOUNTER — Other Ambulatory Visit (HOSPITAL_COMMUNITY): Payer: Self-pay | Admitting: Internal Medicine

## 2023-07-05 ENCOUNTER — Ambulatory Visit
Admission: RE | Admit: 2023-07-05 | Discharge: 2023-07-05 | Disposition: A | Discharge status: Home / Self Care | Source: Ambulatory Visit | Attending: Rheumatology | Admitting: Rheumatology

## 2023-07-05 ENCOUNTER — Ambulatory Visit: Payer: Medicare Other | Attending: Critical Care Medicine

## 2023-07-05 VITALS — Ht 63.5 in | Wt 157.0 lb

## 2023-07-05 DIAGNOSIS — Z13820 Encounter for screening for osteoporosis: Secondary | ICD-10-CM

## 2023-07-05 DIAGNOSIS — Z0000 Encounter for general adult medical examination without abnormal findings: Secondary | ICD-10-CM

## 2023-07-05 DIAGNOSIS — R7989 Other specified abnormal findings of blood chemistry: Secondary | ICD-10-CM

## 2023-07-05 NOTE — Patient Instructions (Addendum)
 Fonner , Thank you for taking time to come for your Medicare Wellness Visit. I appreciate your ongoing commitment to your health goals. Please review the following plan we discussed and let me know if I can assist you in the future.   Referrals/Orders/Follow-Ups/Clinician Recommendations: Keep maintaining your health by keeping your appointments with Dr.  Newlin and Dr. Brent Cambric and any specialists that you may see.  Call us  if you need anything.  Have a great year!!!!  This is a list of the screening recommended for you and due dates:  Health Maintenance  Topic Date Due   DTaP/Tdap/Td vaccine (1 - Tdap) Never done   DEXA scan (bone density measurement)  Never done   COVID-19 Vaccine (3 - Pfizer risk series) 10/19/2019   Flu Shot  10/14/2023   Medicare Annual Wellness Visit  07/04/2024   Pneumonia Vaccine  Completed   Hepatitis C Screening  Completed   HPV Vaccine  Aged Out   Meningitis B Vaccine  Aged Out   Colon Cancer Screening  Discontinued   Zoster (Shingles) Vaccine  Discontinued    Advanced directives: (Declined) Advance directive discussed with you today. Even though you declined this today, please call our office should you change your mind, and we can give you the proper paperwork for you to fill out.  Next Medicare Annual Wellness Visit scheduled for next year: Yes, It was nice speaking with you today! Your next Annual Wellness Visit is scheduled for 07/10/2024 at 3:30 p.m. via PHONE VISIT. If you need to reschedule or cancel, please call [phone].

## 2023-07-05 NOTE — Progress Notes (Signed)
 Because this visit was a virtual/telehealth visit,  certain criteria was not obtained, such a blood pressure, CBG if applicable, and timed get up and go. Any medications not marked as "taking" were not mentioned during the medication reconciliation part of the visit. Any vitals not documented were not able to be obtained due to this being a telehealth visit or patient was unable to self-report a recent blood pressure reading due to a lack of equipment at home via telehealth. Vitals that have been documented are verbally provided by the patient.   Subjective:   NASTASIA Morgan is a 78 y.o. who presents for a Medicare Wellness preventive visit.  Visit Complete: Virtual I connected with  Sheri Morgan on 07/05/23 by a audio enabled telemedicine application and verified that I am speaking with the correct person using two identifiers.  Patient Location: Home  Provider Location: Office/Clinic  I discussed the limitations of evaluation and management by telemedicine. The patient expressed understanding and agreed to proceed.  Vital Signs: Because this visit was a virtual/telehealth visit, some criteria may be missing or patient reported. Any vitals not documented were not able to be obtained and vitals that have been documented are patient reported.  VideoDeclined- This patient declined Librarian, academic. Therefore the visit was completed with audio only.  Persons Participating in Visit: Patient.  AWV Questionnaire: No: Patient Medicare AWV questionnaire was not completed prior to this visit.  Cardiac Risk Factors include: advanced age (>38men, >13 women)     Objective:    Today's Vitals   07/05/23 1517  Weight: 157 lb (71.2 kg)  Height: 5' 3.5" (1.613 m)  PainSc: 6   PainLoc: Back   Body mass index is 27.38 kg/m.     07/05/2023    3:21 PM 06/30/2022    3:52 PM 04/30/2021    7:42 AM 11/13/2020    5:08 PM  Advanced Directives  Does Patient Have a  Medical Advance Directive? No No No No  Would patient like information on creating a medical advance directive? No - Patient declined  Yes (Inpatient - patient requests chaplain consult to create a medical advance directive) Yes (ED - Information included in AVS)    Current Medications (verified) Outpatient Encounter Medications as of 07/05/2023  Medication Sig   amiodarone  (PACERONE ) 100 MG tablet Take 100 mg by mouth daily.   amiodarone  (PACERONE ) 200 MG tablet TAKE 1 TABLET BY MOUTH EVERY DAY   apixaban  (ELIQUIS ) 5 MG TABS tablet Take 1 tablet (5 mg total) by mouth 2 (two) times daily.   azaTHIOprine (IMURAN) 50 MG tablet Take 50 mg by mouth daily.   [employer]S D3 25 MCG (1000 UT) capsule TAKE 1 CAPSULE BY MOUTH EVERY DAY   ezetimibe  (ZETIA ) 10 MG tablet Take 1 tablet (10 mg total) by mouth daily.   FARXIGA  10 MG TABS tablet TAKE 1 TABLET BY MOUTH EVERY DAY   fexofenadine  (ALLEGRA ) 180 MG tablet TAKE 1 TABLET BY MOUTH EVERY DAY   folic acid  (FOLVITE ) 1 MG tablet TAKE 1 TABLET BY MOUTH EVERY DAY   furosemide  (LASIX ) 40 MG tablet TAKE 1 TABLET AS NEEDED FOR WEIGHT GAIN > 3 LB IN A DAY OR 5 LB IN A WEEK, LEG EDEMA, SHORTNESS OF BREATH   levothyroxine  (SYNTHROID ) 112 MCG tablet TAKE 1 TABLET BY MOUTH DAILY BEFORE BREAKFAST.   losartan  (COZAAR ) 25 MG tablet Take 0.5 tablets (12.5 mg total) by mouth daily.   mexiletine (MEXITIL ) 200 MG capsule TAKE  1 CAPSULE BY MOUTH TWICE A DAY   pantoprazole  (PROTONIX ) 40 MG tablet TAKE 1 TABLET BY MOUTH EVERY DAY   polyethylene glycol powder (GLYCOLAX /MIRALAX ) 17 GM/SCOOP powder Take 17 g by mouth 2 (two) times daily as needed.   potassium chloride  SA (KLOR-CON  M) 20 MEQ tablet TAKE 1 TABLET (20 MEQ TOTAL) BY MOUTH WHEN TAKING FUROSEMIDE    rosuvastatin  (CRESTOR ) 40 MG tablet TAKE 1 TABLET BY MOUTH EVERY DAY   spironolactone  (ALDACTONE ) 25 MG tablet Take 25 mg by mouth daily.   No facility-administered encounter medications on file as of 07/05/2023.    Allergies  (verified) Codeine   History: Past Medical History:  Diagnosis Date   Anxiety    Atypical atrial flutter (HCC)    Cardiac sarcoidosis    Chronic combined systolic and diastolic CHF (congestive heart failure) (HCC)    Fibromyalgia    Hypertension    Non-ischemic cardiomyopathy (HCC)    LHC 04/23/2021: 30% stenosis of proximal LAD   Paroxysmal atrial fibrillation (HCC)    Premature atrial contractions    Premature ventricular contraction    S/P ICD (internal cardiac defibrillator) procedure    Tachy-brady syndrome (HCC)    VT (ventricular tachycardia) (HCC)    Past Surgical History:  Procedure Laterality Date   ABDOMINAL HYSTERECTOMY     CARDIOVERSION N/A 04/30/2021   Procedure: CARDIOVERSION;  Surgeon: Mardell Shade, MD;  Location: [facility] ENDOSCOPY;  Service: Cardiovascular;  Laterality: N/A;   CARDIOVERSION N/A 05/08/2021   Procedure: CARDIOVERSION;  Surgeon: Mardell Shade, MD;  Location: [facility] ENDOSCOPY;  Service: Cardiovascular;  Laterality: N/A;   ICD IMPLANT N/A 05/04/2021   Procedure: ICD IMPLANT;  Surgeon: Tammie Fall, MD;  Location: [facility] INVASIVE CV LAB;  Service: Cardiovascular;  Laterality: N/A;   LEFT HEART CATH AND CORONARY ANGIOGRAPHY N/A 04/23/2021   Procedure: LEFT HEART CATH AND CORONARY ANGIOGRAPHY;  Surgeon: Odie Benne, MD;  Location: [facility] INVASIVE CV LAB;  Service: Cardiovascular;  Laterality: N/A;   LUMBAR LAMINECTOMY     TEE WITHOUT CARDIOVERSION N/A 05/08/2021   Procedure: TRANSESOPHAGEAL ECHOCARDIOGRAM (TEE);  Surgeon: Mardell Shade, MD;  Location: [facility] ENDOSCOPY;  Service: Cardiovascular;  Laterality: N/A;   THYROIDECTOMY     Family History  Problem Relation Age of Onset   Diabetes Mother    Alcoholism Father    Social History   Socioeconomic History   Marital status: Divorced    Spouse name: Not on file   Number of children: Not on file   Years of education: Not on file   Highest education level: Not on file  Occupational History    Not on file  Tobacco Use   Smoking status: Never   Smokeless tobacco: Never  Vaping Use   Vaping status: Never Used  Substance and Sexual Activity   Alcohol use: No   Drug use: No   Sexual activity: Never  Other Topics Concern   Not on file  Social History Narrative   Retired from [employer]   Social Drivers of Health   Financial Resource Strain: Low Risk  (07/05/2023)   Overall Financial Resource Strain (CARDIA)    Difficulty of Paying Living Expenses: Not hard at all  Food Insecurity: No Food Insecurity (07/05/2023)   Hunger Vital Sign    Worried About Running Out of Food in the Last Year: Never true    Ran Out of Food in the Last Year: Never true  Transportation Needs: No Transportation Needs (07/05/2023)   PRAPARE - Transportation  Lack of Transportation (Medical): No    Lack of Transportation (Non-Medical): No  Physical Activity: Insufficiently Active (07/05/2023)   Exercise Vital Sign    Days of Exercise per Week: 6 days    Minutes of Exercise per Session: 20 min  Stress: No Stress Concern Present (07/05/2023)   [employer] of Occupational Health - Occupational Stress Questionnaire    Feeling of Stress : Not at all  Social Connections: Moderately Integrated (07/05/2023)   Social Connection and Isolation Panel [NHANES]    Frequency of Communication with Friends and Family: More than three times a week    Frequency of Social Gatherings with Friends and Family: More than three times a week    Attends Religious Services: More than 4 times per year    Active Member of [employer] or Organizations: Yes    Attends Banker Meetings: Never    Marital Status: Divorced    Tobacco Counseling Counseling given: Not Answered    Clinical Intake:  Pre-visit preparation completed: Yes  Pain : 0-10 Pain Score: 6  Pain Type: Chronic pain Pain Location: Back Pain Orientation: Lower, Mid     BMI - recorded: 27.38 Nutritional Status: BMI 25 -29  Overweight Nutritional Risks: None Diabetes: No  Lab Results  Component Value Date   HGBA1C 5.7 (H) 03/24/2023   HGBA1C 6.0 (H) 04/23/2021   HGBA1C 6.4 02/05/2020     How often do you need to have someone help you when you read instructions, pamphlets, or other written materials from your doctor or pharmacy?: 1 - Never What is the last grade level you completed in school?: HSG  Interpreter Needed?: No  Information entered by :: Druscilla Gerhard, LPN.   Activities of Daily Living     07/05/2023    3:22 PM  In your present state of health, do you have any difficulty performing the following activities:  Hearing? 1  Comment YES, NO HEARING AIDS  Vision? 0  Difficulty concentrating or making decisions? 1  Walking or climbing stairs? 0  Dressing or bathing? 0  Doing errands, shopping? 1  Comment PT DOES NOT DRIVE  Preparing Food and eating ? N  Using the Toilet? N  In the past six months, have you accidently leaked urine? N  Do you have problems with loss of bowel control? N  Managing your Medications? N  Managing your Finances? N  Housekeeping or managing your Housekeeping? N    Patient Care Team: Vernell Goldsmith, MD as PCP - General (Pulmonary Disease) Acharya, Gayatri A, MD as PCP - Cardiology (Cardiology) Celia Coles Angus Kenning, DPM as Consulting Physician (Podiatry)  Indicate any recent Medical Services you may have received from other than [facility] providers in the past year (date may be approximate).     Assessment:   This is a routine wellness examination for Morgan.  Hearing/Vision screen Hearing Screening - Comments:: Patient has difficulty hearing. No hearing aids. Vision Screening - Comments:: Wears rx glasses - up to date with routine eye exams with [facility]    Goals Addressed   None    Depression Screen     07/05/2023    3:21 PM 11/17/2022   11:07 AM 07/20/2022    1:56 PM 06/30/2022    3:54 PM 05/26/2022   11:27 AM 02/24/2022   11:08 AM 09/16/2021    11:04 AM  PHQ 2/9 Scores  PHQ - 2 Score 0 0 2 0 1 2 1   PHQ- 9 Score 7 10 7  4 8 9     Fall Risk     07/05/2023    3:24 PM 03/24/2023   11:49 AM 07/20/2022    1:56 PM 06/30/2022    3:53 PM 05/26/2022   10:28 AM  Fall Risk   Falls in the past year? 1 0 0 1 0  Comment    going up the stairs and stumbled   Number falls in past yr: 0 0 0 0 0  Injury with Fall? 0 0 0 0 0  Risk for fall due to : No Fall Risks No Fall Risks No Fall Risks Medication side effect;Impaired balance/gait No Fall Risks  Follow up Falls prevention discussed;Falls evaluation completed Falls evaluation completed  Falls prevention discussed;Education provided;Falls evaluation completed     MEDICARE RISK AT HOME:  Medicare Risk at Home Any stairs in or around the home?: Yes (15 steps) If so, are there any without handrails?: No Home free of loose throw rugs in walkways, pet beds, electrical cords, etc?: Yes Adequate lighting in your home to reduce risk of falls?: Yes Life alert?: No Use of a cane, walker or w/c?: No Grab bars in the bathroom?: No Shower chair or bench in shower?: No Elevated toilet seat or a handicapped toilet?: No  TIMED UP AND GO:  Was the test performed?  No  Cognitive Function: 6CIT completed    07/05/2023    3:21 PM  MMSE - Mini Mental State Exam  Not completed: Unable to complete        07/05/2023    3:26 PM 06/30/2022    4:09 PM 11/12/2020    6:21 PM  6CIT Screen  What Year? 0 points 0 points 0 points  What month? 0 points 0 points 0 points  What time? 0 points 0 points 0 points  Count back from 20 0 points 0 points 0 points  Months in reverse 2 points 0 points 0 points  Repeat phrase 0 points 4 points 0 points  Total Score 2 points 4 points 0 points    Immunizations Immunization History  Administered Date(s) Administered   Fluad Trivalent(High Dose 65+) 03/24/2023   PFIZER(Purple Top)SARS-COV-2 Vaccination 08/31/2019, 09/21/2019   PNEUMOCOCCAL CONJUGATE-20 07/13/2021     Screening Tests Health Maintenance  Topic Date Due   DTaP/Tdap/Td (1 - Tdap) Never done   DEXA SCAN  Never done   COVID-19 Vaccine (3 - Pfizer risk series) 10/19/2019   INFLUENZA VACCINE  10/14/2023   Medicare Annual Wellness (AWV)  07/04/2024   Pneumonia Vaccine 79+ Years old  Completed   Hepatitis C Screening  Completed   HPV VACCINES  Aged Out   Meningococcal B Vaccine  Aged Out   Colonoscopy  Discontinued   Zoster Vaccines- Shingrix  Discontinued    Health Maintenance  Health Maintenance Due  Topic Date Due   DTaP/Tdap/Td (1 - Tdap) Never done   DEXA SCAN  Never done   COVID-19 Vaccine (3 - Pfizer risk series) 10/19/2019   Health Maintenance Items Addressed: Yes DEXA ordered  Additional Screening:  Vision Screening: Recommended annual ophthalmology exams for early detection of glaucoma and other disorders of the eye.  Dental Screening: Recommended annual dental exams for proper oral hygiene  Community Resource Referral / Chronic Care Management: CRR required this visit?  No   CCM required this visit?  No     Plan:     I have personally reviewed and noted the following in the patient's chart:   Medical and social history  Use of alcohol, tobacco or illicit drugs  Current medications and supplements including opioid prescriptions. Patient is not currently taking opioid prescriptions. Functional ability and status Nutritional status Physical activity Advanced directives List of other physicians Hospitalizations, surgeries, and ER visits in previous 12 months Vitals Screenings to include cognitive, depression, and falls Referrals and appointments  In addition, I have reviewed and discussed with patient certain preventive protocols, quality metrics, and best practice recommendations. A written personalized care plan for preventive services as well as general preventive health recommendations were provided to patient.     Margette Sheldon,  LPN   1/61/0960   After Visit Summary: (MyChart) Due to this being a telephonic visit, the after visit summary with patients personalized plan was offered to patient via MyChart   Notes: Please refer to Routing Comments.

## 2023-07-11 ENCOUNTER — Telehealth (HOSPITAL_COMMUNITY): Payer: Self-pay

## 2023-07-11 NOTE — Telephone Encounter (Signed)
 Called to confirm/remind patient of their appointment at [facility] on 07/12/23.   Appointment:   [x] Confirmed  [] Left mess   [] No answer/No voice mail  [] VM Full/unable to leave message  [] Phone not in service  Patient reminded to bring all medications and/or complete list.  Confirmed patient has transportation. Gave directions, instructed to utilize valet parking.

## 2023-07-11 NOTE — Progress Notes (Signed)
 [facility] NOTE  PCP: Vernell Goldsmith, MD Rheumatology: Dr. Roetta Clarke Nephrology: Dr. Yvonnie Heritage HF Cardiologist: Dr. Julane Ny  Reason for Visit: Heart Failure Follow-up HPI: Sheri Morgan is a 78 y.o. obese woman with fibromyalgia, PAF, PVCs, HTN, sarcoid and systolic HF   Previously followed by Dr. Santo Cullen in [place] Saw Dr. Bonney Butte in 2/22 with recurrent AF. Echo 5/22 with newly discovered LV dysfunction EF 30-35%.   Admitted 2/23 with CP and SOB. CT negative for dissection. Underwent cath showing EF 20-25%.  30% pLAD and elevated LVEDP ? Possible takotsubo.  Echo 20-25% moderate MR. On way to cath developed VT. Started on amiodarone  developed more bradycardia -> PMVT. She had subsequent hypotension and cardiogenic shock requiring DBA gtt. EP consulted and lidocaine  gtt started with reduced PVC burden. She developed AFL with RVR and underwent DCCV. Initially successful but then had recurrent AFL. Due to bradycardia and need for amiodarone , underwent placement of dual chamber ICD. Then loaded with IV amio and underwent successful TEE/DCCV. She remained on amio for PVCs and mexiletine started when PVCs not fully suppressed. cMRI suspicious for cardiac sarcoid and started on treatment protocol. GDMT limited by elevated SCr, discharged home, weight 191 lbs.  Cardiac PET 9/23 EF 51% no FDG uptake. Echo 9/23 EF 45%, RV ok.  Follow up 12/23, completed prednisone  9/23. Stable NYHA II, volume mildly up, takes lasix  PRN.   Admitted to [facility] 2/24 with diverticulitis and shock. Entresto  held, started on midodrine . Echo showed EF 45-50%. Had been off MTX. Discharge weight, 157 lbs.  Follow up 5/24, NYHA II and volume up. Entresto  24/26 restarted, instructed to take Lasix  40 mg daily x 3 days then back to PRN. Had been off methotrexate  since previous hospitalization (about 3 months). Restart at 15 mg q week x 2 weeks then 20 mg q week thereafter.  Rheumatologist 6/24, methotrexate  stopped and  azathioprine started. Referred to nephrology with declining renal function.  Underwent PET scan: EF 41% without FDG uptake. No active sarcoid.   She returns today for heart failure follow up with her daughter. Overall feeling well. NYHA II. Reports fatigue. Denies chest pain, dyspnea, near-syncope, orthopnea, palpitations, dizziness, and abnormal bleeding. Able to perform ADLs. Appetite okay. Weight at home trending up, appears caloric. Compliant with all medications.  Cardiac Studies: - Echo (2/24): EF 45-50%, RV ok - Cardiac PET (9/23): EF 51% no FDG uptake.  - Echo (9/23): EF 45%, grade I DD, RV OK - Echo (2/23): LVEF of 20-25% with global hypokinesis, mildly enlarged RV with moderately reduced systolic function, mild to moderate MR, and moderately elevated RVSP of 54.0 mmHg.  - LHC (2/23): 30% stenosis of pLAD, elevated LVEDP - cMRI (2/23): LVEF 11%, RVEF 55%, apical LGE, no LV thrombus, pattern concerning for cardiac sarcoid.  Past Medical History:  Diagnosis Date   Anxiety    Atypical atrial flutter (HCC)    Cardiac sarcoidosis    Chronic combined systolic and diastolic CHF (congestive heart failure) (HCC)    Fibromyalgia    Hypertension    Non-ischemic cardiomyopathy (HCC)    LHC 04/23/2021: 30% stenosis of proximal LAD   Paroxysmal atrial fibrillation (HCC)    Premature atrial contractions    Premature ventricular contraction    S/P ICD (internal cardiac defibrillator) procedure    Tachy-brady syndrome (HCC)    VT (ventricular tachycardia) (HCC)    Current Outpatient Medications  Medication Sig Dispense Refill   amiodarone  (PACERONE ) 100 MG tablet Take 100 mg by mouth  daily.     amiodarone  (PACERONE ) 200 MG tablet TAKE 1 TABLET BY MOUTH EVERY DAY 90 tablet 0   apixaban  (ELIQUIS ) 5 MG TABS tablet Take 1 tablet (5 mg total) by mouth 2 (two) times daily. 60 tablet 11   azaTHIOprine (IMURAN) 50 MG tablet Take 50 mg by mouth daily.     CVS D3 25 MCG (1000 UT) capsule TAKE 1  CAPSULE BY MOUTH EVERY DAY 90 capsule 0   ezetimibe  (ZETIA ) 10 MG tablet Take 1 tablet (10 mg total) by mouth daily. 90 tablet 1   FARXIGA  10 MG TABS tablet TAKE 1 TABLET BY MOUTH EVERY DAY 90 tablet 3   fexofenadine  (ALLEGRA ) 180 MG tablet TAKE 1 TABLET BY MOUTH EVERY DAY 90 tablet 0   folic acid  (FOLVITE ) 1 MG tablet TAKE 1 TABLET BY MOUTH EVERY DAY 90 tablet 3   furosemide  (LASIX ) 40 MG tablet TAKE 1 TABLET AS NEEDED FOR WEIGHT GAIN > 3 LB IN A DAY OR 5 LB IN A WEEK, LEG EDEMA, SHORTNESS OF BREATH 270 tablet 1   levothyroxine  (SYNTHROID ) 112 MCG tablet TAKE 1 TABLET BY MOUTH DAILY BEFORE BREAKFAST. 30 tablet 0   losartan  (COZAAR ) 25 MG tablet Take 0.5 tablets (12.5 mg total) by mouth daily. 45 tablet 3   mexiletine (MEXITIL ) 200 MG capsule TAKE 1 CAPSULE BY MOUTH TWICE A DAY 180 capsule 1   pantoprazole  (PROTONIX ) 40 MG tablet TAKE 1 TABLET BY MOUTH EVERY DAY 90 tablet 1   polyethylene glycol powder (GLYCOLAX /MIRALAX ) 17 GM/SCOOP powder Take 17 g by mouth 2 (two) times daily as needed. 3350 g 1   potassium chloride  SA (KLOR-CON  M) 20 MEQ tablet TAKE 1 TABLET (20 MEQ TOTAL) BY MOUTH WHEN TAKING FUROSEMIDE  90 tablet 0   rosuvastatin  (CRESTOR ) 40 MG tablet TAKE 1 TABLET BY MOUTH EVERY DAY 90 tablet 3   spironolactone  (ALDACTONE ) 25 MG tablet Take 25 mg by mouth daily.     No current facility-administered medications for this visit.   Allergies  Allergen Reactions   Codeine Itching   Social History   Socioeconomic History   Marital status: Divorced    Spouse name: Not on file   Number of children: Not on file   Years of education: Not on file   Highest education level: Not on file  Occupational History   Not on file  Tobacco Use   Smoking status: Never   Smokeless tobacco: Never  Vaping Use   Vaping status: Never Used  Substance and Sexual Activity   Alcohol use: No   Drug use: No   Sexual activity: Never  Other Topics Concern   Not on file  Social History Narrative    Retired from [employer]   Social Drivers of Corporate investment banker Strain: Low Risk  (07/05/2023)   Overall Financial Resource Strain (CARDIA)    Difficulty of Paying Living Expenses: Not hard at all  Food Insecurity: No Food Insecurity (07/05/2023)   Hunger Vital Sign    Worried About Running Out of Food in the Last Year: Never true    Ran Out of Food in the Last Year: Never true  Transportation Needs: No Transportation Needs (07/05/2023)   PRAPARE - Administrator, Civil Service (Medical): No    Lack of Transportation (Non-Medical): No  Physical Activity: Insufficiently Active (07/05/2023)   Exercise Vital Sign    Days of Exercise per Week: 6 days    Minutes of Exercise per Session: 20  min  Stress: No Stress Concern Present (07/05/2023)   [employer] of Occupational Health - Occupational Stress Questionnaire    Feeling of Stress : Not at all  Social Connections: Moderately Integrated (07/05/2023)   Social Connection and Isolation Panel [NHANES]    Frequency of Communication with Friends and Family: More than three times a week    Frequency of Social Gatherings with Friends and Family: More than three times a week    Attends Religious Services: More than 4 times per year    Active Member of [employer] or Organizations: Yes    Attends Banker Meetings: Never    Marital Status: Divorced  Catering manager Violence: Not At Risk (07/05/2023)   Humiliation, Afraid, Rape, and Kick questionnaire    Fear of Current or Ex-Partner: No    Emotionally Abused: No    Physically Abused: No    Sexually Abused: No   Family History  Problem Relation Age of Onset   Diabetes Mother    Alcoholism Father    There were no vitals taken for this visit.  Wt Readings from Last 3 Encounters:  07/05/23 71.2 kg (157 lb)  03/24/23 71.8 kg (158 lb 6.4 oz)  11/17/22 68 kg (150 lb)   PHYSICAL EXAM: General: Well appearing. No distress on RA Cardiac: JVP flat. S1 and S2 present. No  murmurs or rub. Extremities: Warm and dry.  No edema.  Neuro: Alert and oriented x3. Affect pleasant. Moves all extremities without difficulty.  Device interrogation (personally reviewed): Corvue good. No VT/VF, AF <1%, 95% AP, 42% VP    ASSESSMENT & PLAN: 1.  Chronic systolic HF due to NICM  - Echo (5/22): EF 30-35% - Cath (2/23): minimal CAD.  - cMRI (2/23): EF 11% RVEF 15% LGE pattern concerning for sarcoid. - Echo (9/23): EF 45% - Cardiac PET (9/23): EF 51% no FDG uptake.  - Cardiac PET (1/25): EF 41% no FDG uptake.  - Stable NYHA II. Volume stable on exam. BMET/BNP today - GDMT titration limited by CKD - Continue Lasix  PRN, taking EOD - Continue losartan  12.5 mg daily - Continue Farxiga  10 mg daily. - Off spiro, unsure why, thinks nephrology discontinued it   2. Cardiac sarcoidosis - based on cMRI and clinical course - ACE level normal (52 U/L). CT chest. No extra-cardiac sarcoid. - Finished prednisone  in 9/23 - Cardiac PET 9/23 EF 51% no FDG uptake. No extra cardiac sarcoid either  - Cardiac PET 1/25 EF 41% no FDG uptake off prednisone  - Saw Rheum Dr. Roetta Clarke. Off methotrexate , continue imuran - Continue folic acid  1 mg daily.  3. PVCs/NSVT/PMVT - EP following.  - S/p ICD 2/23. No VT on device interrogation  - Continue amio 200 mg daily + mexiletine 200 mg bid for suppression.    4. Paroxysmal Atrial Flutter - S/p DCCV 2/16 -> back in AFL on 05/02/21.  - s/p TEE/DCCV 05/08/21-->NSR - Continue Eliquis  5 mg bid (will need dose reduction in December) - Continue amiodarone .    5. CKD 3b - Baseline SCr 1.7-1.9 - Continue SGLT2i - Now follows with Dr. Yvonnie Heritage CKD  Follow up in 3 months with Dr. Julane Ny  [place] Edgel Degnan, NP 3:40 PM

## 2023-07-12 ENCOUNTER — Encounter (HOSPITAL_COMMUNITY): Payer: Self-pay

## 2023-07-12 ENCOUNTER — Ambulatory Visit (HOSPITAL_COMMUNITY)
Admission: RE | Admit: 2023-07-12 | Discharge: 2023-07-12 | Disposition: A | Discharge status: Home / Self Care | Source: Ambulatory Visit | Attending: Cardiology | Admitting: Cardiology

## 2023-07-12 VITALS — BP 128/78 | HR 60 | Ht 63.5 in | Wt 160.8 lb

## 2023-07-12 DIAGNOSIS — E669 Obesity, unspecified: Secondary | ICD-10-CM | POA: Insufficient documentation

## 2023-07-12 DIAGNOSIS — Z7901 Long term (current) use of anticoagulants: Secondary | ICD-10-CM | POA: Insufficient documentation

## 2023-07-12 DIAGNOSIS — Z95810 Presence of automatic (implantable) cardiac defibrillator: Secondary | ICD-10-CM | POA: Diagnosis not present

## 2023-07-12 DIAGNOSIS — I480 Paroxysmal atrial fibrillation: Secondary | ICD-10-CM | POA: Diagnosis not present

## 2023-07-12 DIAGNOSIS — N1832 Chronic kidney disease, stage 3b: Secondary | ICD-10-CM

## 2023-07-12 DIAGNOSIS — I5022 Chronic systolic (congestive) heart failure: Secondary | ICD-10-CM | POA: Diagnosis not present

## 2023-07-12 DIAGNOSIS — I428 Other cardiomyopathies: Secondary | ICD-10-CM | POA: Insufficient documentation

## 2023-07-12 DIAGNOSIS — I2510 Atherosclerotic heart disease of native coronary artery without angina pectoris: Secondary | ICD-10-CM | POA: Insufficient documentation

## 2023-07-12 DIAGNOSIS — I5020 Unspecified systolic (congestive) heart failure: Secondary | ICD-10-CM

## 2023-07-12 DIAGNOSIS — I472 Ventricular tachycardia, unspecified: Secondary | ICD-10-CM | POA: Insufficient documentation

## 2023-07-12 DIAGNOSIS — I4729 Other ventricular tachycardia: Secondary | ICD-10-CM

## 2023-07-12 DIAGNOSIS — I130 Hypertensive heart and chronic kidney disease with heart failure and stage 1 through stage 4 chronic kidney disease, or unspecified chronic kidney disease: Secondary | ICD-10-CM | POA: Insufficient documentation

## 2023-07-12 DIAGNOSIS — I493 Ventricular premature depolarization: Secondary | ICD-10-CM | POA: Diagnosis not present

## 2023-07-12 DIAGNOSIS — D8685 Sarcoid myocarditis: Secondary | ICD-10-CM | POA: Diagnosis not present

## 2023-07-12 DIAGNOSIS — Z79624 Long term (current) use of inhibitors of nucleotide synthesis: Secondary | ICD-10-CM | POA: Insufficient documentation

## 2023-07-12 DIAGNOSIS — Z79899 Other long term (current) drug therapy: Secondary | ICD-10-CM | POA: Insufficient documentation

## 2023-07-12 LAB — BASIC METABOLIC PANEL WITH GFR
Anion gap: 11 (ref 5–15)
BUN: 21 mg/dL (ref 8–23)
CO2: 25 mmol/L (ref 22–32)
Calcium: 9.4 mg/dL (ref 8.9–10.3)
Chloride: 102 mmol/L (ref 98–111)
Creatinine, Ser: 1.8 mg/dL — ABNORMAL HIGH (ref 0.44–1.00)
GFR, Estimated: 28 mL/min — ABNORMAL LOW (ref >60)
Glucose, Bld: 88 mg/dL (ref 70–99)
Potassium: 4.4 mmol/L (ref 3.5–5.1)
Sodium: 138 mmol/L (ref 135–145)

## 2023-07-12 LAB — BRAIN NATRIURETIC PEPTIDE: B Natriuretic Peptide: 56.5 pg/mL (ref 0.0–100.0)

## 2023-07-12 NOTE — Patient Instructions (Addendum)
 Medication Changes:  No Changes In Medications at this time.   Lab Work:  Labs done today, your results will be available in MyChart, we will contact you for abnormal readings.  Follow-Up in: 3 months follow up with Dr. Bensimhon PLEASE CALL OUR OFFICE AROUND late may TO GET SCHEDULED FOR YOUR APPOINTMENT. PHONE NUMBER IS [phone] OPTION 2    At [facility], you and your health needs are our priority. We have a designated team specialized in the treatment of Heart Failure. This Care Team includes your primary Heart Failure Specialized Cardiologist (physician), Advanced Practice Providers (APPs- Physician Assistants and Nurse Practitioners), and Pharmacist who all work together to provide you with the care you need, when you need it.   You may see any of the following providers on your designated Care Team at your next follow up:  Dr. Jules Oar Dr. Peder Bourdon Dr. Alwin Baars Dr. Judyth Nunnery Nieves Bars, NP Ruddy Corral, [place] [facility] [place], [place] Dennise Fitz, NP [place] Lee, NP Luster Salters, PharmD   Please be sure to bring in all your medications bottles to every appointment.   Need to Contact Us :  If you have any questions or concerns before your next appointment please send us  a message through [place] or call our office at [phone].    TO LEAVE A MESSAGE FOR THE NURSE SELECT OPTION 2, PLEASE LEAVE A MESSAGE INCLUDING: YOUR NAME DATE OF BIRTH CALL BACK NUMBER REASON FOR CALL**this is important as we prioritize the call backs  YOU WILL RECEIVE A CALL BACK THE SAME DAY AS LONG AS YOU CALL BEFORE 4:00 PM

## 2023-07-24 ENCOUNTER — Other Ambulatory Visit: Payer: Self-pay | Admitting: Family Medicine

## 2023-07-26 NOTE — Telephone Encounter (Signed)
 Requested medication (s) are due for refill today: yes  Requested medication (s) are on the active medication list: yes  Last refill:  06/28/23  Future visit scheduled: no  Notes to clinic:  Unable to refill per protocol, courtesy refill already given, routing for provider approval.      Requested Prescriptions  Pending Prescriptions Disp Refills   levothyroxine  (SYNTHROID ) 112 MCG tablet [Pharmacy Med Name: LEVOTHYROXINE  112 MCG TABLET] 30 tablet 0    Sig: TAKE 1 TABLET BY MOUTH EVERY DAY BEFORE BREAKFAST     Endocrinology:  Hypothyroid Agents Failed - 07/26/2023 11:47 AM      Failed - TSH in normal range and within 360 days    TSH  Date Value Ref Range Status  03/24/2023 23.100 (H) 0.450 - 4.500 uIU/mL Final         Passed - Valid encounter within last 12 months    Recent Outpatient Visits           4 months ago Primary hypertension   [facility] Of [place]. [facility] Joaquin Mulberry, MD   8 months ago Primary hypertension   [facility] - A Dept Of [place]. [facility] Vernell Goldsmith, MD   1 year ago HFrEF (heart failure with reduced ejection fraction) [facility])   [facility] - A Dept Of [place]. [facility] Vernell Goldsmith, MD   1 year ago Other specified disorders of kidney and ureter   [facility] - A Dept Of [place]. [facility], Stan Eans, [place]   1 year ago Needs flu shot   [place] [facility] - A Dept Of [place]. [facility] Roy, [place], PA-C

## 2023-07-28 ENCOUNTER — Encounter: Payer: Self-pay | Admitting: Internal Medicine

## 2023-08-01 ENCOUNTER — Ambulatory Visit (INDEPENDENT_AMBULATORY_CARE_PROVIDER_SITE_OTHER): Payer: Medicare Other

## 2023-08-01 DIAGNOSIS — I5022 Chronic systolic (congestive) heart failure: Secondary | ICD-10-CM

## 2023-08-01 DIAGNOSIS — I428 Other cardiomyopathies: Secondary | ICD-10-CM

## 2023-08-02 ENCOUNTER — Ambulatory Visit: Payer: Self-pay | Admitting: Internal Medicine

## 2023-08-02 ENCOUNTER — Telehealth (HOSPITAL_COMMUNITY): Payer: Self-pay

## 2023-08-02 LAB — CUP PACEART REMOTE DEVICE CHECK
Battery Remaining Longevity: 60 mo
Battery Remaining Percentage: 74 %
Battery Voltage: 2.99 V
Brady Statistic AP VP Percent: 43 %
Brady Statistic AP VS Percent: 53 %
Brady Statistic AS VP Percent: 1 %
Brady Statistic AS VS Percent: 4.3 %
Brady Statistic RA Percent Paced: 95 %
Brady Statistic RV Percent Paced: 43 %
Date Time Interrogation Session: 20250519020017
HighPow Impedance: 69 Ohm
HighPow Impedance: 69 Ohm
Implantable Lead Connection Status: 753985
Implantable Lead Connection Status: 753985
Implantable Lead Implant Date: 20230220
Implantable Lead Implant Date: 20230220
Implantable Lead Location: 753859
Implantable Lead Location: 753860
Implantable Lead Model: 7122
Implantable Pulse Generator Implant Date: 20230220
Lead Channel Impedance Value: 400 Ohm
Lead Channel Impedance Value: 400 Ohm
Lead Channel Pacing Threshold Amplitude: 0.625 V
Lead Channel Pacing Threshold Amplitude: 1.25 V
Lead Channel Pacing Threshold Pulse Width: 0.5 ms
Lead Channel Pacing Threshold Pulse Width: 0.5 ms
Lead Channel Sensing Intrinsic Amplitude: 1.8 mV
Lead Channel Sensing Intrinsic Amplitude: 11.2 mV
Lead Channel Setting Pacing Amplitude: 2.5 V
Lead Channel Setting Pacing Amplitude: 2.5 V
Lead Channel Setting Pacing Pulse Width: 0.5 ms
Lead Channel Setting Sensing Sensitivity: 0.5 mV
Pulse Gen Serial Number: 8937527
Zone Setting Status: 755011

## 2023-08-02 NOTE — Telephone Encounter (Signed)
 Advanced [facility] Patient Advocate Encounter  The patient was approved for a [employer] grant that will help cover the cost of Farxiga , Spironolactone .  Total amount awarded, $4,500.  Effective: 07/27/2023 - 07/25/2024.  BIN [id number] PCN [id number] [id number] ID [id number]  Pharmacy provided with approval and processing information. Patient informed via phone.  Kennis Peacock, CPhT Rx Patient Advocate Phone: [phone]

## 2023-08-20 ENCOUNTER — Other Ambulatory Visit (HOSPITAL_COMMUNITY): Payer: Self-pay | Admitting: Family Medicine

## 2023-08-23 ENCOUNTER — Other Ambulatory Visit (HOSPITAL_COMMUNITY): Payer: Self-pay | Admitting: Internal Medicine

## 2023-08-23 ENCOUNTER — Other Ambulatory Visit: Payer: Self-pay | Admitting: Family Medicine

## 2023-08-23 ENCOUNTER — Other Ambulatory Visit: Payer: Self-pay

## 2023-08-23 MED ORDER — POTASSIUM CHLORIDE CRYS ER 20 MEQ PO TBCR: 20.0000 meq | EXTENDED_RELEASE_TABLET | Freq: Every day | ORAL | 0 refills | Status: DC

## 2023-09-09 ENCOUNTER — Other Ambulatory Visit (HOSPITAL_COMMUNITY): Payer: Self-pay | Admitting: Internal Medicine

## 2023-09-09 ENCOUNTER — Other Ambulatory Visit: Payer: Self-pay | Admitting: Family Medicine

## 2023-09-14 NOTE — Progress Notes (Signed)
 Remote ICD transmission.

## 2023-09-14 NOTE — Addendum Note (Signed)
 Addended by: TAWNI DRILLING D on: 09/14/2023 12:09 PM   Modules accepted: Orders

## 2023-09-25 ENCOUNTER — Other Ambulatory Visit: Payer: Self-pay | Admitting: Family Medicine

## 2023-09-29 ENCOUNTER — Other Ambulatory Visit: Payer: Self-pay | Admitting: Family Medicine

## 2023-10-02 ENCOUNTER — Other Ambulatory Visit (HOSPITAL_COMMUNITY): Payer: Self-pay | Admitting: Internal Medicine

## 2023-10-03 ENCOUNTER — Other Ambulatory Visit (HOSPITAL_COMMUNITY): Payer: Self-pay | Admitting: Internal Medicine

## 2023-10-03 ENCOUNTER — Telehealth: Payer: Self-pay | Admitting: Critical Care Medicine

## 2023-10-03 NOTE — Telephone Encounter (Signed)
 Copied from CRM [id number]. Topic: Appointments - Scheduling Inquiry for Clinic >> Sep 30, 2023 12:03 PM Sheri Morgan wrote:  Reason for CRM:  Patient requesting urgent refill. Advised she must be seen first, but next available is mid-August. Please advise if earlier appointment can be offered or if provider can review for short-term refill coverage in the meantime.  Patient can be reached at [phone]

## 2023-10-03 NOTE — Telephone Encounter (Signed)
 Patient is scheduled to see Dr.Wright  10/27/2023 at 2:50 pm for medication refill.

## 2023-10-11 ENCOUNTER — Other Ambulatory Visit (HOSPITAL_COMMUNITY): Payer: Self-pay | Admitting: Internal Medicine

## 2023-10-11 ENCOUNTER — Other Ambulatory Visit (HOSPITAL_COMMUNITY): Payer: Self-pay | Admitting: Family Medicine

## 2023-10-23 NOTE — Progress Notes (Signed)
 Established Patient Office Visit  Subjective   Patient ID: Sheri Morgan, adult    DOB: [birth year]  Age: 78 y.o. MRN: [id number]  Chief Complaint  Patient presents with   Hypertension    Medication refills Not sleeping    07/20/22 This is a 78 year old woman with chronic heart failure likely due to sarcoidosis, reduced ejection fraction 30%, recurrent PVC and SVT PM VT status post ICD placement.  Patient recently admitted to [facility] for shock due to diverticulitis such that her medications were adjusted.  She was started on midodrine  for a brief period of time now off this.  Her losartan  and Entresto  have been held.  Her methotrexate  has been held as well.  Her thyroid  was elevated and her Synthroid  dose reduced to 150 mcg 7 days a week.  She last had cardiology visit in the heart failure clinic in December of last year.  She needs a return visit.  She is complaining of toenail fungus again as well.  She is requesting refills on proton pump inhibitor.  Patient then saw PA Mcclung in March.  Documentation is as below as well.  Below is documentation from hospitalization in February of this year.  Sheri Morgan is a 78 y.o. adult here today for a follow up visit After admission 04/27/2022-05/05/2022 for suspected diverticulitis and hypovolemic shock at [facility].  BP meds (entresto  and losartan ) have not been resumed.  She is still taking midodrine .  BP at home are 120-130/70-80.  She has still been having some nausea and about 1 episode of vomiting per week or less.  She has had 2 episodes of BRB in the toilet.  She has been constipated.  She is just overall not feeling back to herself.  Documented weight loss from 172 to 157 in Epic.  She says she gets full more easily and appetite is not as good as it used to be.  No fever.  No acute abdominal pain.  She has not had to take lasix  or potassium in a while.  She denies CP/SOB/edema         From [facility]  Notes: Hospital Course: For full details, please see H&P, progress notes, consult notes and ancillary notes. Briefly, 78 y.o. female with HFrEF (EF 30-35%) on lasix , cardiac sarcoidosis, recurrent PVCs/NSVT/PMVT s/p ICD, paroxsysmal atrial flutter presenting to the ED for evaluation of abdominal pain, persistent nausea vomiting, admitted to the ICU for vasopressor support. Hospital course will be addressed in a problem based format as below.  #Shock of uncertain etiology, likely hypovolemic versus septic #Suspected diverticulitis #History of HFrEF, cardiac sarcoidosis, PMVT s/p ICD Patient initially presented after several weeks of persistent nausea/vomiting with meals and persistent abdominal pain. She presented with systolic blood pressures in the 60s/70s, and received nearly 4 L of IV fluid resuscitation in the ED, but remained hypotensive and thus was admitted to the ICU and started on Levophed . At one point, she required up to 26 mcg/minute of Levophed . She was started empirically with Zosyn provide days for suspected intra-abdominal source of infection. Her infectious workup was mostly unremarkable, with negative blood cultures, negative urinalysis, negative respiratory symptoms/diarrhea. She had a CT abdomen/pelvis with contrast performed which showed nonspecific enteritis involving descending colon, and potential inflammatory changes along the ascending colon which could represent an early/developing diverticulitis. She also had a repeat TTE performed which showed an EF of 40-45%, elevated from her prior known EF of 30-35%. To improve her cardiac output while in shock,  EP adjusted her pacemaker and increased heart rate from 60 bpm to 80 bpm. The etiology of her shock was felt to be likely hypovolemic versus septic, and unlikely to be cardiogenic or hemorrhagic event absence of decreasing hemoglobin and warm, flushed extremities.  Prior to transfer from the MICU to the ACE unit, patient had been off  IV vasopressors for 24 hours and started on 7.5 mg 3 times daily midodrine , with losartan /Entresto  held. Upon transfer to our unit, patient's systolic blood pressure remained around 100/110. EP was reengaged, and EP we adjusted her pacemaker back to 60 bpm on the day of discharge.  #AKI on CKD Patient has a baseline creatinine of 1.4-1.6, with an admission creatinine of 2.33. She received up to 4 L of IV fluids, which initially improved her creatinine. On the day of discharge, her creatinine had returned to around her baseline at 1.62.  On day of discharge, patient is clinically stable with no new examination findings or acute symptoms compared to prior. The patient was seen by the attending physician on the date of discharge and deemed stable and acceptable for discharge. The patient's chronic medical conditions were treated accordingly per the patient's home medication regimen. The patient's medication reconciliation, follow-up appointments, discharge orders, instructions, and significant lab and diagnostic studies are as noted.  Medication changes: 1. Midodrine  7.5 mg 3 times daily prescribed as part of bridging for hypotension, continue at discharge 2. Losartan  and Entresto  both held on discharge given SBP 100-110 on discharge. 3. Levothyroxine  dose reduced from 200 mcg daily to 175 mcg daily given TSH < 0.05 and elevated free T4 4. Continue methotrexate  20 mg weekly  Discharge Follow-up Action Items: 1. Follow-up with PCP in 1-2 weeks (patient has PCP in [place] but is planning on moving to [place]) 2. Reassess blood pressure, unhold Entresto  as indicated. Consider removing losartan  from home medication list as patient is already on Entresto  (sacubitril /valsartan ) 3. Recheck TSH and free T4, adjust synthroid  dose as necessary 4. Recheck metabolic panel to evaluate for creatinine 5. Gold card to cardiology (patient would like to establish in [place] as she is moving away from  [place]). Pacemaker settings were returned to baseline on day of discharge. 6. On CT abdomen/pelvis, a renal lesion (right upper pole lobulated, intermediate density) was noted (stable since 2016) concerning for slow-growing neoplasm. Consider repeating CT abdomen/pelvis in 6 months-1 year (or sooner based on clinical judgement) for monitoring.   The patient then has had rectal pain and bleeding from the rectum.  Patient is been followed by [facility] health gastroenterology documentation from April as below.  GI 06/2022 1. Change in bowel habits 2. Weight loss 3. Nausea and vomiting, unspecified vomiting type 4. Early satiety 5. Rectal bleeding 6. Constipation, unspecified constipation type 7. Abnormal CT of the abdomen  Plan: Anokhi Shannon is a 78 y.o. female with a past medical history of diverticulitis, colon polyps, CHF, CKD, and A-fib seen today for ED follow-up of rectal pain and bleeding. From PA Mcclung note in March Ms. Jagger reports today for ED follow-up after [facility] due to rectal pain and bleeding. A CT scan revealed mild mural thickening of the distal aspect of the rectum, may reflect nonspecific proctitis. Large colorectal stool burden. Findings suggestive of constipation/possible fecal impaction. No evidence of bowel obstruction. Scattered colonic diverticulosis without evidence of diverticulitis. She reports that around that time she was having a very hard time moving her bowels and also had some maroon-colored stools  due to very large, bulky bowel movements. She reports that this is improved, but now she is seeing thin caliber stools. Her last colonoscopy was in 2009 with findings of diverticulosis. She was placed on a 5-year recall. She is also struggling with nausea, vomiting, early satiety, and weight loss. She has lost about 40 pounds in the last 2 months. Her last upper endoscopy was also in 2009 and was with erosive esophagitis. With her  recent symptoms, I think she needs to be evaluated with both an upper endoscopy and a colonoscopy. She is agreeable. She has a history of ICD so we will plan for this at [facility] later this week. She is also on Eliquis  and I have asked her to hold this for 2 days prior to this procedure. We will prep her with Sutab. Further recommendations to follow pending the results of her endoscopic exam.  Patient states she is not currently having rectal bleeding.  She brings with her her medications and the medication list does not correlate with current medications.  She is supposed to be on 150 mcg of Synthroid  daily and she is taking this as prescribed.  She is off the losartan  and Entresto .  She is taking 25 mg of the Aldactone .  She is taking amiodarone  200 mg dose she is supposed to be on 100 mg.  She is maintaining her Eliquis .  I spent quite a bit of time reconciling her medications and asking her to bring all her medicines in a bag with her for the next visits.  Note also patient supposed to be on Farxiga  but has never obtained this medication.  11/17/22 This patient is seen in return follow-up she has a complex history with associated cardiac sarcoidosis she has been followed by rheumatology the heart failure clinic including electrophysiology.  She was last seen in May and today is a follow-up visit.  Below is documentation of her complex history.  She is also followed by nephrology  CHF 09/2022 Chronic systolic HF due to NICM  - Echo (5/22): EF 30-35% - Cath (2/23): minimal CAD. Hs-troponin 204 -> 379 - Echo (2/23): EF 20-25% - cMRI (2/23): EF 11% RVEF 15% LGE pattern concerning for sarcoid. (D/w Dr. Kate)  - Echo (9/23): EF 45% - Cardiac PET (9/23): EF 51% no FDG uptake.  - Echo (2/24): EF 45-50%, RV ok - Stable NYHA II. Volume OK today. GDMT limited by recent hypovolemic shock and need for midodrine . Now off midodrine . - Restart losartan  12.5 mg daily (SCr 1.98-->2.49 on Entresto  24/26) - Continue  Lasix  PRN - Continue spironolactone  25 mg daily.  - Continue Farxiga  10 mg daily. - No b-blocker with bradycardia/long 1AVB. - Labs today.   2. Cardiac sarcoidosis - based on cMRI and clinical course - ACE level normal (52 U/L). CT chest. No extra-cardiac sarcoid. - Finished prednisone  in 9/23 - Cardiac PET 9/23 EF 51% no FDG uptake. No extra cardiac sarcoid either  - Will need repeat PET in 3-6 months off prednisone  to make sure sarcoid not re-activating off prednisone .  - Saw Rheum Dr. Strazanac. Methotrexate  stopped and Imuran started - Continue folic acid  1 mg daily. - CMET and CBC w/ diff today, will forward to Rheum - Schedule cardiac PET   3. PVCs/NSVT/PMVT - EP following.  - S/p ICD 2/23. No VT on device interrogation  - Continue amio 100 mg daily + mexiletine 200 mg bid for suppression.    4. Paroxysmal Atrial Flutter - S/p DCCV 2/16 -> back  in [facility] on 05/02/21.  - s/p TEE/DCCV 05/08/21-->NSR - Regular on exam today. - Continue Eliquis  5 mg bid. No bleeding issues. - Continue amiodarone . Recent TSH low 0.244, she is on levothyroxine  with dose recently reduced to 100 mcg. - Recent CBC stable with hgb 12.3   5. Thrombocytopenia - Platelets 142>133>92>113>125 - Most recent labs showed Plt 122   6. CKD 3b - Baseline SCr 1.7-1.9 - Continue SGLT2i - Labs today - She has been referred to [facility]  Patient now living with her son and daughter-in-law they do not get along very well she has a lot of depression and anxiety.  She is interested in antidepressant however most of these have side effects and conflicts with her cardiac program.  There are no new symptoms no other complaints she does need refills on her noncardiac medications we have been giving her   8/14/205 last seen by me 11/2022 Patient was seen in January of this year by Dr. Delbert note is as below  Dr Delbert: 03/2023 of hypertension, paroxysmal A-fib, nonischemic cardiomyopathy (status post ICD), cardiac  sarcoidosis, hyperlipidemia, anxiety and depression, stage III CKD, thrombocytopenia, Prediabetes, Hypothyroidism..   Interval History: Discussed the use of AI scribe software for clinical note transcription with the patient, who gave verbal consent to proceed.   She presents requesting a handicap sticker due to increasing weakness in her knees and ankles. She reports difficulty walking long distances and has noticed a decline in her mobility.   In addition to her mobility concerns, she has been experiencing a nightly cough, which she describes as productive with mucus. She has been managing this with cough drops, which she reports as being somewhat effective. She expresses interest in a medication to help manage the secretions causing her cough.   The patient also reports that she has not taken her blood pressure medication on the day of the visit, which she attributes to being in a hurry. She typically monitors her blood pressure at home, with readings usually in the range of 130s/85. From a cardiac standpoint she is doing well with no lightheadedness, chest pains, dyspnea or pedal edema and she has an upcoming appointment with cardiology.   She also has a history of hypothyroidism, for which she takes levothyroxine . She reports constipation, which she manages with Miralax .   Hypertension Elevated blood pressure at the visit, but patient had not taken her medication that morning. No symptoms of heart failure reported. -Continue current regimen. -Recheck blood pressure at the end of the visit returned normal -Continue current regimen   Hyperlipidemia Last cholesterol check in 2023 was elevated. -Order lipid panel. -Adjust medication based on results.   Hypothyroidism Last thyroid  function test in May was abnormal with suppressed TSH -Order thyroid  function test. -Adjust levothyroxine  dose based on results.   Prediabetes Last A1c in 2023 was 6.0. -Order A1c. -Continue current  management based on results.   Chronic Kidney Disease stage III Ongoing abnormal kidney function. -Order renal function tests. -Continue current management based on results.   Cardiac Sarcoidosis and Paroxysmal Atrial Fibrillation No new symptoms reported. -Currently in sinus rhythm -Continue current management. -Currently on amiodarone  and Eliquis    Constipation Patient reports constipation and is currently using Miralax . -Continue Miralax  as needed.   Cough Patient reports nocturnal cough with mucus in the back of the throat. -Prescribe Allegra  to help with secretions.   Mobility Issues Patient reports difficulty walking due to knee and ankle weakness. -Complete handicap form for patient.   [facility]f  clinic 06/2023 Cardiac Studies: - Echo (2/24): EF 45-50%, RV ok - Cardiac PET (9/23): EF 51% no FDG uptake.  - Echo (9/23): EF 45%, grade I DD, RV OK - Echo (2/23): LVEF of 20-25% with global hypokinesis, mildly enlarged RV with moderately reduced systolic function, mild to moderate MR, and moderately elevated RVSP of 54.0 mmHg.  - LHC (2/23): 30% stenosis of pLAD, elevated LVEDP - cMRI (2/23): LVEF 11%, RVEF 55%, apical LGE, no LV thrombus, pattern concerning for cardiac sarcoid. Continue Lasix  PRN, taking EOD - Continue losartan  12.5 mg daily - Continue Farxiga  10 mg daily. - Off spiro, unsure why, thinks nephrology discontinued it   2. Cardiac sarcoidosis - based on cMRI and clinical course - ACE level normal (52 U/L). CT chest. No extra-cardiac sarcoid. - Finished prednisone  in 9/23 - Cardiac PET 9/23 EF 51% no FDG uptake. No extra cardiac sarcoid either  - Cardiac PET 1/25 EF 41% no FDG uptake off prednisone  - Saw Rheum Dr. Alverda. Off methotrexate , continue imuran - Continue folic acid  1 mg daily.   3. PVCs/NSVT/PMVT - EP following.  - S/p ICD 2/23. No VT on device interrogation  - Continue amio 200 mg daily + mexiletine 200 mg bid for suppression.    4. Paroxysmal  Atrial Flutter - S/p DCCV 2/16 -> back in [facility] on 05/02/21.  - s/p TEE/DCCV 05/08/21-->NSR - Continue Eliquis  5 mg bid (will need dose reduction in December) - Continue amiodarone .    5. CKD 3b - Baseline SCr 1.7-1.9 - Continue SGLT2i - Now follows with Dr. Jerrye CKD   The patient has cardiac sarcoidosis followed by rheumatology stage IIIb chronic kidney disease has an ICD had been on prednisone  is off this medication maintains Imuran and folic acid  for her cardiac sarcoid followed by the heart failure clinic last seen in April of this year documentation is as both above.  Patient brings in a bag of medication and a full medication reconciliation is done that took 15 minutes of this visit Patient does have nighttime congestion of the nose with a cough no problems with breathing no other cardiac episodes patient is followed by nephrology for chronic kidney disease spironolactone  had been held but it has now been resumed       Review of Systems  Constitutional:  Negative for chills, diaphoresis, fever, malaise/fatigue and weight loss.  HENT:  Positive for congestion. Negative for hearing loss, nosebleeds, sore throat and tinnitus.   Eyes:  Negative for blurred vision, photophobia and redness.  Respiratory:  Positive for cough. Negative for hemoptysis, sputum production, shortness of breath, wheezing and stridor.   Cardiovascular:  Negative for chest pain, palpitations, orthopnea, claudication, leg swelling and PND.  Gastrointestinal:  Negative for abdominal pain, blood in stool, constipation, diarrhea, heartburn, nausea and vomiting.  Genitourinary:  Negative for dysuria, flank pain, frequency, hematuria and urgency.  Musculoskeletal:  Negative for back pain, falls, joint pain, myalgias and neck pain.  Skin:  Negative for itching and rash.  Neurological:  Negative for dizziness, tingling, tremors, sensory change, speech change, focal weakness, seizures, loss of consciousness, weakness and  headaches.  Endo/Heme/Allergies:  Negative for environmental allergies and polydipsia. Does not bruise/bleed easily.  Psychiatric/Behavioral:  Negative for depression, memory loss, substance abuse and suicidal ideas. The patient is not nervous/anxious and does not have insomnia.       Objective:     BP 120/73   Pulse 60   Ht 5' 3.5 (1.613 m)   Wt 156 lb (  70.8 kg)   SpO2 99%   BMI 27.20 kg/m    Physical Exam Vitals reviewed.  Constitutional:      Appearance: Normal appearance. eustolia is well-developed. Tyreona is not diaphoretic.  HENT:     Head: Normocephalic and atraumatic.     Nose: No nasal deformity, septal deviation, mucosal edema or rhinorrhea.     Right Sinus: No maxillary sinus tenderness or frontal sinus tenderness.     Left Sinus: No maxillary sinus tenderness or frontal sinus tenderness.     Mouth/Throat:     Pharynx: No oropharyngeal exudate.  Eyes:     General: No scleral icterus.    Conjunctiva/sclera: Conjunctivae normal.     Pupils: Pupils are equal, round, and reactive to light.  Neck:     Thyroid : No thyromegaly.     Vascular: No carotid bruit or JVD.     Trachea: Trachea normal. No tracheal tenderness or tracheal deviation.  Cardiovascular:     Rate and Rhythm: Normal rate and regular rhythm.     Chest Wall: PMI is not displaced.     Pulses: Normal pulses. No decreased pulses.     Heart sounds: S1 normal and S2 normal. Heart sounds not distant. Murmur heard.     No systolic murmur is present.     No diastolic murmur is present.     No friction rub. No gallop. No S3 or S4 sounds.     Comments: Systolic murmur Pulmonary:     Effort: Pulmonary effort is normal. No tachypnea, accessory muscle usage or respiratory distress.     Breath sounds: No stridor. No decreased breath sounds, wheezing, rhonchi or rales.  Chest:     Chest wall: No tenderness.  Abdominal:     General: Abdomen is flat. Bowel sounds are normal. There is no distension.     Palpations:  Abdomen is soft. Abdomen is not rigid.     Tenderness: There is no abdominal tenderness. There is no guarding or rebound.  Musculoskeletal:        General: Normal range of motion.     Cervical back: Normal range of motion and neck supple. No edema, erythema or rigidity. No muscular tenderness. Normal range of motion.  Lymphadenopathy:     Head:     Right side of head: No submental or submandibular adenopathy.     Left side of head: No submental or submandibular adenopathy.     Cervical: No cervical adenopathy.  Skin:    General: Skin is warm and dry.     Coloration: Skin is not pale.     Findings: No rash.     Nails: There is no clubbing.  Neurological:     Mental Status: Kasy is alert and oriented to person, place, and time.     Sensory: No sensory deficit.  Psychiatric:        Speech: Speech normal.        Behavior: Behavior normal.      Results for orders placed or performed in visit on 10/27/23  CBC with Differential/Platelet  Result Value Ref Range   WBC 4.4 3.4 - 10.8 x10E3/uL   RBC 4.23 3.77 - 5.28 x10E6/uL   Hemoglobin 13.2 11.1 - 15.9 g/dL   Hematocrit 57.0 65.9 - 46.6 %   MCV 101 (H) 79 - 97 fL   MCH 31.2 26.6 - 33.0 pg   MCHC 30.8 (L) 31.5 - 35.7 g/dL   RDW 84.0 (H) 88.2 - 84.5 %   Platelets  156 150 - 450 x10E3/uL   Neutrophils 58 Not Estab. %   Lymphs 25 Not Estab. %   Monocytes 14 Not Estab. %   Eos 1 Not Estab. %   Basos 1 Not Estab. %   Neutrophils Absolute 2.6 1.4 - 7.0 x10E3/uL   Lymphocytes Absolute 1.1 0.7 - 3.1 x10E3/uL   Monocytes Absolute 0.6 0.1 - 0.9 x10E3/uL   EOS (ABSOLUTE) 0.1 0.0 - 0.4 x10E3/uL   Basophils Absolute 0.0 0.0 - 0.2 x10E3/uL   Immature Granulocytes 0 Not Estab. %   Immature Grans (Abs) 0.0 0.0 - 0.1 x10E3/uL      The ASCVD Risk score (Arnett DK, et al., 2019) failed to calculate for the following reasons:   Risk score cannot be calculated because patient has a medical history suggesting prior/existing ASCVD     Assessment & Plan:   Problem List Items Addressed This Visit       Cardiovascular and Mediastinum   Primary hypertension   Currently at goal no change medication      Relevant Medications   ezetimibe  (ZETIA ) 10 MG tablet   spironolactone  (ALDACTONE ) 25 MG tablet   NICM (nonischemic cardiomyopathy) (HCC)   Management per cardiology      Relevant Medications   ezetimibe  (ZETIA ) 10 MG tablet   spironolactone  (ALDACTONE ) 25 MG tablet   Paroxysmal atrial fibrillation   Maintain Eliquis  check CBC      Relevant Medications   ezetimibe  (ZETIA ) 10 MG tablet   spironolactone  (ALDACTONE ) 25 MG tablet   Cardiac sarcoidosis   Management per cardiology and rheumatology      Relevant Medications   ezetimibe  (ZETIA ) 10 MG tablet   spironolactone  (ALDACTONE ) 25 MG tablet   PVC (premature ventricular contraction)   Continue with pacemaker ICD per cardiology      Relevant Medications   ezetimibe  (ZETIA ) 10 MG tablet   spironolactone  (ALDACTONE ) 25 MG tablet     Endocrine   Acquired hypothyroidism   Continue thyroid  replacement thyroid  function previously at baseline and normal      Relevant Medications   levothyroxine  (SYNTHROID ) 112 MCG tablet     Genitourinary   Stage 3a chronic kidney disease (HCC)   Recent renal function at baseline continue follow-up by nephrology they have cleared her to take spironolactone  no evidence of hyperkalemia        Other   Presence of heart assist device (HCC)   Continue per cardiology continue amiodarone       Chronic cough   Chronic cough due to postnasal drainage allergic rhinitis prescribed Astelin  nasal spray told patient to use simple saline at night and use Delsym at night as needed      Other Visit Diagnoses       Current use of long term anticoagulation    -  Primary   Relevant Orders   CBC with Differential/Platelet (Completed)        60 minutes spent extra time needed assessing multiple problems and assessing  complex medication list and reviewing multiple specialist records doing a complete medication reconciliation note patient uses a pill organizer and note patient's granddaughter who is an Charity fundraiser was in this visit Return in about 6 months (around 04/28/2024) for primary care follow up.    Belvie Silvan, MD

## 2023-10-27 ENCOUNTER — Ambulatory Visit: Attending: Critical Care Medicine | Admitting: Critical Care Medicine

## 2023-10-27 ENCOUNTER — Encounter: Payer: Self-pay | Admitting: Critical Care Medicine

## 2023-10-27 VITALS — BP 120/73 | HR 60 | Ht 63.5 in | Wt 156.0 lb

## 2023-10-27 DIAGNOSIS — Z7901 Long term (current) use of anticoagulants: Secondary | ICD-10-CM

## 2023-10-27 DIAGNOSIS — D8685 Sarcoid myocarditis: Secondary | ICD-10-CM

## 2023-10-27 DIAGNOSIS — N1831 Chronic kidney disease, stage 3a: Secondary | ICD-10-CM

## 2023-10-27 DIAGNOSIS — Z95811 Presence of heart assist device: Secondary | ICD-10-CM

## 2023-10-27 DIAGNOSIS — I493 Ventricular premature depolarization: Secondary | ICD-10-CM

## 2023-10-27 DIAGNOSIS — Z79899 Other long term (current) drug therapy: Secondary | ICD-10-CM

## 2023-10-27 DIAGNOSIS — R053 Chronic cough: Secondary | ICD-10-CM

## 2023-10-27 DIAGNOSIS — I428 Other cardiomyopathies: Secondary | ICD-10-CM | POA: Diagnosis not present

## 2023-10-27 DIAGNOSIS — I10 Essential (primary) hypertension: Secondary | ICD-10-CM | POA: Diagnosis not present

## 2023-10-27 DIAGNOSIS — I480 Paroxysmal atrial fibrillation: Secondary | ICD-10-CM

## 2023-10-27 DIAGNOSIS — E039 Hypothyroidism, unspecified: Secondary | ICD-10-CM

## 2023-10-27 MED ORDER — EZETIMIBE 10 MG PO TABS: 10.0000 mg | ORAL_TABLET | Freq: Every day | ORAL | 1 refills | Status: AC

## 2023-10-27 MED ORDER — CVS D3 25 MCG (1000 UT) PO CAPS: 1000.0000 [IU] | ORAL_CAPSULE | Freq: Every day | ORAL | 3 refills | Status: AC

## 2023-10-27 MED ORDER — POTASSIUM CHLORIDE CRYS ER 20 MEQ PO TBCR: 20.0000 meq | EXTENDED_RELEASE_TABLET | Freq: Every day | ORAL | 1 refills | Status: DC

## 2023-10-27 MED ORDER — AZELASTINE HCL 0.1 % NA SOLN: 2.0000 | Freq: Two times a day (BID) | NASAL | 12 refills | Status: AC

## 2023-10-27 MED ORDER — SPIRONOLACTONE 25 MG PO TABS: 12.5000 mg | ORAL_TABLET | Freq: Every day | ORAL | Status: AC

## 2023-10-27 MED ORDER — LEVOTHYROXINE SODIUM 112 MCG PO TABS: 112.0000 ug | ORAL_TABLET | Freq: Every day | ORAL | 2 refills | Status: AC

## 2023-10-27 NOTE — Patient Instructions (Signed)
 Blood counts to be obtained today  Medications were refilled  You can take over-the-counter omeprazole or Nexium or any other form of medicine similar to the medicine you are on and we will not refill the pantoprazole  20 mg daily is sufficient  Start Astelin  2 sprays twice daily each nostril for your allergies which will help the cough and you can take Delsym 10 mL before bedtime to help cough at night the Astelin  is a prescription  Start simple saline 3 sprays each nostril twice a day you get this over-the-counter at [facility]  No other changes return in 6 months for primary care follow-up get your flu shot at your [facility]

## 2023-10-28 ENCOUNTER — Ambulatory Visit: Payer: Self-pay | Admitting: Critical Care Medicine

## 2023-10-28 DIAGNOSIS — R053 Chronic cough: Secondary | ICD-10-CM | POA: Insufficient documentation

## 2023-10-28 LAB — CBC WITH DIFFERENTIAL/PLATELET
Basophils Absolute: 0 x10E3/uL (ref 0.0–0.2)
Basos: 1 %
EOS (ABSOLUTE): 0.1 x10E3/uL (ref 0.0–0.4)
Eos: 1 %
Hematocrit: 42.9 % (ref 34.0–46.6)
Hemoglobin: 13.2 g/dL (ref 11.1–15.9)
Immature Grans (Abs): 0 x10E3/uL (ref 0.0–0.1)
Immature Granulocytes: 0 %
Lymphocytes Absolute: 1.1 x10E3/uL (ref 0.7–3.1)
Lymphs: 25 %
MCH: 31.2 pg (ref 26.6–33.0)
MCHC: 30.8 g/dL — ABNORMAL LOW (ref 31.5–35.7)
MCV: 101 fL — ABNORMAL HIGH (ref 79–97)
Monocytes Absolute: 0.6 x10E3/uL (ref 0.1–0.9)
Monocytes: 14 %
Neutrophils Absolute: 2.6 x10E3/uL (ref 1.4–7.0)
Neutrophils: 58 %
Platelets: 156 x10E3/uL (ref 150–450)
RBC: 4.23 x10E6/uL (ref 3.77–5.28)
RDW: 15.9 % — ABNORMAL HIGH (ref 11.7–15.4)
WBC: 4.4 x10E3/uL (ref 3.4–10.8)

## 2023-10-28 NOTE — Assessment & Plan Note (Signed)
 Management per cardiology.

## 2023-10-28 NOTE — Assessment & Plan Note (Signed)
 Continue with pacemaker ICD per cardiology

## 2023-10-28 NOTE — Assessment & Plan Note (Signed)
 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

## 2023-10-28 NOTE — Assessment & Plan Note (Signed)
 Currently at goal no change medication

## 2023-10-28 NOTE — Assessment & Plan Note (Signed)
 Continue thyroid  replacement thyroid  function previously at baseline and normal

## 2023-10-28 NOTE — Assessment & Plan Note (Signed)
 Maintain Eliquis  check CBC

## 2023-10-28 NOTE — Assessment & Plan Note (Signed)
 Continue per cardiology continue amiodarone 

## 2023-10-28 NOTE — Assessment & Plan Note (Signed)
 Management per cardiology and rheumatology

## 2023-10-28 NOTE — Assessment & Plan Note (Signed)
 Recent renal function at baseline continue follow-up by nephrology they have cleared her to take spironolactone  no evidence of hyperkalemia

## 2023-10-30 ENCOUNTER — Other Ambulatory Visit (HOSPITAL_COMMUNITY): Payer: Self-pay | Admitting: Internal Medicine

## 2023-10-31 ENCOUNTER — Ambulatory Visit (INDEPENDENT_AMBULATORY_CARE_PROVIDER_SITE_OTHER): Payer: Medicare Other

## 2023-10-31 DIAGNOSIS — I428 Other cardiomyopathies: Secondary | ICD-10-CM

## 2023-11-01 ENCOUNTER — Other Ambulatory Visit (HOSPITAL_COMMUNITY): Payer: Self-pay | Admitting: Internal Medicine

## 2023-11-01 LAB — CUP PACEART REMOTE DEVICE CHECK
Battery Remaining Longevity: 59 mo
Battery Remaining Percentage: 72 %
Battery Voltage: 2.98 V
Brady Statistic AP VP Percent: 44 %
Brady Statistic AP VS Percent: 52 %
Brady Statistic AS VP Percent: 1 %
Brady Statistic AS VS Percent: 3.9 %
Brady Statistic RA Percent Paced: 96 %
Brady Statistic RV Percent Paced: 44 %
Date Time Interrogation Session: 20250818025750
HighPow Impedance: 69 Ohm
HighPow Impedance: 69 Ohm
Implantable Lead Connection Status: 753985
Implantable Lead Connection Status: 753985
Implantable Lead Implant Date: 20230220
Implantable Lead Implant Date: 20230220
Implantable Lead Location: 753859
Implantable Lead Location: 753860
Implantable Lead Model: 7122
Implantable Pulse Generator Implant Date: 20230220
Lead Channel Impedance Value: 400 Ohm
Lead Channel Impedance Value: 400 Ohm
Lead Channel Pacing Threshold Amplitude: 0.875 V
Lead Channel Pacing Threshold Amplitude: 1.25 V
Lead Channel Pacing Threshold Pulse Width: 0.5 ms
Lead Channel Pacing Threshold Pulse Width: 0.5 ms
Lead Channel Sensing Intrinsic Amplitude: 1.6 mV
Lead Channel Sensing Intrinsic Amplitude: 11 mV
Lead Channel Setting Pacing Amplitude: 2.5 V
Lead Channel Setting Pacing Amplitude: 2.5 V
Lead Channel Setting Pacing Pulse Width: 0.5 ms
Lead Channel Setting Sensing Sensitivity: 0.5 mV
Pulse Gen Serial Number: 8937527
Zone Setting Status: 755011

## 2023-11-06 ENCOUNTER — Ambulatory Visit: Payer: Self-pay | Admitting: Internal Medicine

## 2023-12-07 NOTE — Progress Notes (Signed)
Remote ICD Transmission.

## 2023-12-27 ENCOUNTER — Other Ambulatory Visit (HOSPITAL_COMMUNITY): Payer: Self-pay | Admitting: Internal Medicine

## 2023-12-30 ENCOUNTER — Other Ambulatory Visit (HOSPITAL_COMMUNITY): Payer: Self-pay | Admitting: Internal Medicine

## 2023-12-31 LAB — LAB REPORT - SCANNED
Albumin, Urine POC: 128.1
Creatinine, POC: 133.3 mg/dL
EGFR: 23
Microalb Creat Ratio: 96

## 2024-01-27 ENCOUNTER — Other Ambulatory Visit: Payer: Self-pay | Admitting: Critical Care Medicine

## 2024-01-30 ENCOUNTER — Ambulatory Visit (INDEPENDENT_AMBULATORY_CARE_PROVIDER_SITE_OTHER): Payer: Medicare Other

## 2024-01-30 DIAGNOSIS — I428 Other cardiomyopathies: Secondary | ICD-10-CM | POA: Diagnosis not present

## 2024-01-31 LAB — CUP PACEART REMOTE DEVICE CHECK
Battery Remaining Longevity: 56 mo
Battery Remaining Percentage: 69 %
Battery Voltage: 2.98 V
Brady Statistic AP VP Percent: 46 %
Brady Statistic AP VS Percent: 50 %
Brady Statistic AS VP Percent: 1 %
Brady Statistic AS VS Percent: 3.7 %
Brady Statistic RA Percent Paced: 96 %
Brady Statistic RV Percent Paced: 46 %
Date Time Interrogation Session: 20251117020018
HighPow Impedance: 74 Ohm
HighPow Impedance: 74 Ohm
Implantable Lead Connection Status: 753985
Implantable Lead Connection Status: 753985
Implantable Lead Implant Date: 20230220
Implantable Lead Implant Date: 20230220
Implantable Lead Location: 753859
Implantable Lead Location: 753860
Implantable Lead Model: 7122
Implantable Pulse Generator Implant Date: 20230220
Lead Channel Impedance Value: 400 Ohm
Lead Channel Impedance Value: 400 Ohm
Lead Channel Pacing Threshold Amplitude: 0.75 V
Lead Channel Pacing Threshold Amplitude: 1.25 V
Lead Channel Pacing Threshold Pulse Width: 0.5 ms
Lead Channel Pacing Threshold Pulse Width: 0.5 ms
Lead Channel Sensing Intrinsic Amplitude: 1.4 mV
Lead Channel Sensing Intrinsic Amplitude: 11.2 mV
Lead Channel Setting Pacing Amplitude: 2.5 V
Lead Channel Setting Pacing Amplitude: 2.5 V
Lead Channel Setting Pacing Pulse Width: 0.5 ms
Lead Channel Setting Sensing Sensitivity: 0.5 mV
Pulse Gen Serial Number: 8937527
Zone Setting Status: 755011

## 2024-02-01 ENCOUNTER — Ambulatory Visit: Payer: Self-pay | Admitting: Internal Medicine

## 2024-02-01 NOTE — Progress Notes (Signed)
 Remote ICD Transmission

## 2024-02-14 ENCOUNTER — Other Ambulatory Visit (HOSPITAL_COMMUNITY): Payer: Self-pay | Admitting: Internal Medicine

## 2024-03-11 ENCOUNTER — Other Ambulatory Visit (HOSPITAL_COMMUNITY): Payer: Self-pay | Admitting: Internal Medicine

## 2024-03-28 ENCOUNTER — Other Ambulatory Visit (HOSPITAL_COMMUNITY): Payer: Self-pay | Admitting: Internal Medicine

## 2024-04-26 ENCOUNTER — Ambulatory Visit (HOSPITAL_COMMUNITY)

## 2024-04-30 ENCOUNTER — Ambulatory Visit: Admitting: Family Medicine

## 2024-04-30 ENCOUNTER — Ambulatory Visit: Payer: Self-pay | Admitting: Critical Care Medicine

## 2024-07-10 ENCOUNTER — Ambulatory Visit: Payer: Self-pay

## 2024-07-10 ENCOUNTER — Encounter
